# Patient Record
Sex: Female | Born: 1955 | Race: White | Hispanic: No | Smoking: Never smoker
Health system: Southern US, Community
[De-identification: ages and names within clinical notes are randomized; demographics above are authoritative.]

## PROBLEM LIST (undated history)

## (undated) DIAGNOSIS — M9080 Osteopathy in diseases classified elsewhere, unspecified site: Secondary | ICD-10-CM

## (undated) DIAGNOSIS — N183 Chronic kidney disease, stage 3 unspecified: Secondary | ICD-10-CM

## (undated) DIAGNOSIS — I7300 Raynaud's syndrome without gangrene: Secondary | ICD-10-CM

## (undated) DIAGNOSIS — Z8719 Personal history of other diseases of the digestive system: Secondary | ICD-10-CM

## (undated) DIAGNOSIS — K219 Gastro-esophageal reflux disease without esophagitis: Secondary | ICD-10-CM

## (undated) DIAGNOSIS — Z862 Personal history of diseases of the blood and blood-forming organs and certain disorders involving the immune mechanism: Secondary | ICD-10-CM

## (undated) DIAGNOSIS — K222 Esophageal obstruction: Secondary | ICD-10-CM

## (undated) DIAGNOSIS — I10 Essential (primary) hypertension: Secondary | ICD-10-CM

## (undated) DIAGNOSIS — I7090 Unspecified atherosclerosis: Secondary | ICD-10-CM

## (undated) DIAGNOSIS — E889 Metabolic disorder, unspecified: Secondary | ICD-10-CM

## (undated) DIAGNOSIS — N189 Chronic kidney disease, unspecified: Secondary | ICD-10-CM

## (undated) DIAGNOSIS — M898X9 Other specified disorders of bone, unspecified site: Secondary | ICD-10-CM

## (undated) DIAGNOSIS — M349 Systemic sclerosis, unspecified: Secondary | ICD-10-CM

## (undated) HISTORY — DX: Unspecified atherosclerosis: I70.90

## (undated) HISTORY — PX: HAND SURGERY: SHX662

## (undated) HISTORY — DX: Raynaud's syndrome without gangrene: I73.00

## (undated) HISTORY — DX: Personal history of diseases of the blood and blood-forming organs and certain disorders involving the immune mechanism: Z86.2

## (undated) HISTORY — DX: Personal history of other diseases of the digestive system: Z87.19

## (undated) HISTORY — DX: Chronic kidney disease, stage 3 (moderate): N18.3

## (undated) HISTORY — DX: Gastro-esophageal reflux disease without esophagitis: K21.9

## (undated) HISTORY — DX: Esophageal obstruction: K22.2

## (undated) HISTORY — DX: Metabolic disorder, unspecified: E88.9

## (undated) HISTORY — DX: Chronic kidney disease, stage 3 unspecified: N18.30

## (undated) HISTORY — PX: TONSILLECTOMY: SUR1361

## (undated) HISTORY — DX: Systemic sclerosis, unspecified: M34.9

## (undated) HISTORY — DX: Chronic kidney disease, unspecified: N18.9

## (undated) HISTORY — DX: Essential (primary) hypertension: I10

## (undated) HISTORY — DX: Osteopathy in diseases classified elsewhere, unspecified site: M90.80

## (undated) HISTORY — DX: Other specified disorders of bone, unspecified site: M89.8X9

---

## 1991-08-28 ENCOUNTER — Encounter (INDEPENDENT_AMBULATORY_CARE_PROVIDER_SITE_OTHER): Payer: Self-pay | Admitting: *Deleted

## 2002-06-29 ENCOUNTER — Encounter: Admission: RE | Admit: 2002-06-29 | Discharge: 2002-06-29 | Payer: Self-pay | Admitting: Family Medicine

## 2002-06-29 ENCOUNTER — Encounter: Payer: Self-pay | Admitting: Family Medicine

## 2002-07-06 ENCOUNTER — Inpatient Hospital Stay (HOSPITAL_COMMUNITY): Admission: AD | Admit: 2002-07-06 | Discharge: 2002-07-08 | Payer: Self-pay | Admitting: Internal Medicine

## 2002-07-07 ENCOUNTER — Encounter: Payer: Self-pay | Admitting: Cardiology

## 2002-10-26 ENCOUNTER — Encounter: Admission: RE | Admit: 2002-10-26 | Discharge: 2002-10-26 | Payer: Self-pay | Admitting: Rheumatology

## 2002-10-26 ENCOUNTER — Encounter: Payer: Self-pay | Admitting: Rheumatology

## 2003-07-13 ENCOUNTER — Encounter: Payer: Self-pay | Admitting: Rheumatology

## 2003-07-13 ENCOUNTER — Encounter: Admission: RE | Admit: 2003-07-13 | Discharge: 2003-07-13 | Payer: Self-pay | Admitting: Rheumatology

## 2005-05-12 ENCOUNTER — Ambulatory Visit: Payer: Self-pay | Admitting: Internal Medicine

## 2005-05-25 ENCOUNTER — Ambulatory Visit (HOSPITAL_COMMUNITY): Admission: RE | Admit: 2005-05-25 | Discharge: 2005-05-25 | Payer: Self-pay | Admitting: Rheumatology

## 2005-05-25 ENCOUNTER — Ambulatory Visit: Payer: Self-pay | Admitting: Cardiology

## 2005-05-25 ENCOUNTER — Encounter: Payer: Self-pay | Admitting: Cardiology

## 2006-07-13 ENCOUNTER — Ambulatory Visit: Payer: Self-pay | Admitting: Internal Medicine

## 2006-07-23 ENCOUNTER — Encounter: Payer: Self-pay | Admitting: Internal Medicine

## 2006-07-23 ENCOUNTER — Ambulatory Visit (HOSPITAL_COMMUNITY): Admission: RE | Admit: 2006-07-23 | Discharge: 2006-07-23 | Payer: Self-pay | Admitting: Internal Medicine

## 2006-07-23 DIAGNOSIS — K449 Diaphragmatic hernia without obstruction or gangrene: Secondary | ICD-10-CM

## 2006-07-23 DIAGNOSIS — K222 Esophageal obstruction: Secondary | ICD-10-CM

## 2006-07-27 ENCOUNTER — Ambulatory Visit: Payer: Self-pay | Admitting: Internal Medicine

## 2006-08-23 ENCOUNTER — Ambulatory Visit: Payer: Self-pay | Admitting: Internal Medicine

## 2006-09-19 ENCOUNTER — Encounter: Admission: RE | Admit: 2006-09-19 | Discharge: 2006-09-19 | Payer: Self-pay | Admitting: Rheumatology

## 2006-12-30 ENCOUNTER — Ambulatory Visit (HOSPITAL_BASED_OUTPATIENT_CLINIC_OR_DEPARTMENT_OTHER): Admission: RE | Admit: 2006-12-30 | Discharge: 2006-12-30 | Payer: Self-pay | Admitting: Orthopedic Surgery

## 2006-12-30 ENCOUNTER — Encounter (INDEPENDENT_AMBULATORY_CARE_PROVIDER_SITE_OTHER): Payer: Self-pay | Admitting: Specialist

## 2007-04-08 ENCOUNTER — Ambulatory Visit: Payer: Self-pay | Admitting: Internal Medicine

## 2008-04-09 ENCOUNTER — Encounter (HOSPITAL_BASED_OUTPATIENT_CLINIC_OR_DEPARTMENT_OTHER): Admission: RE | Admit: 2008-04-09 | Discharge: 2008-05-24 | Payer: Self-pay | Admitting: Surgery

## 2008-04-24 ENCOUNTER — Encounter: Payer: Self-pay | Admitting: Internal Medicine

## 2008-05-11 ENCOUNTER — Telehealth: Payer: Self-pay | Admitting: Internal Medicine

## 2008-05-24 ENCOUNTER — Encounter: Payer: Self-pay | Admitting: Internal Medicine

## 2008-05-25 ENCOUNTER — Encounter: Payer: Self-pay | Admitting: Internal Medicine

## 2008-06-22 ENCOUNTER — Ambulatory Visit: Payer: Self-pay | Admitting: Internal Medicine

## 2008-06-22 DIAGNOSIS — R634 Abnormal weight loss: Secondary | ICD-10-CM

## 2008-06-22 DIAGNOSIS — M349 Systemic sclerosis, unspecified: Secondary | ICD-10-CM

## 2008-09-05 ENCOUNTER — Ambulatory Visit: Payer: Self-pay | Admitting: Cardiovascular Disease

## 2008-09-05 ENCOUNTER — Encounter (INDEPENDENT_AMBULATORY_CARE_PROVIDER_SITE_OTHER): Payer: Self-pay | Admitting: Emergency Medicine

## 2008-09-05 ENCOUNTER — Inpatient Hospital Stay (HOSPITAL_COMMUNITY): Admission: EM | Admit: 2008-09-05 | Discharge: 2008-09-06 | Payer: Self-pay | Admitting: Emergency Medicine

## 2008-09-14 ENCOUNTER — Encounter: Payer: Self-pay | Admitting: Internal Medicine

## 2008-12-25 ENCOUNTER — Ambulatory Visit: Payer: Self-pay | Admitting: Internal Medicine

## 2008-12-25 DIAGNOSIS — J45909 Unspecified asthma, uncomplicated: Secondary | ICD-10-CM

## 2009-01-04 ENCOUNTER — Ambulatory Visit: Payer: Self-pay | Admitting: Internal Medicine

## 2009-02-15 ENCOUNTER — Encounter: Payer: Self-pay | Admitting: Internal Medicine

## 2009-02-22 ENCOUNTER — Ambulatory Visit: Payer: Self-pay | Admitting: Internal Medicine

## 2009-03-07 ENCOUNTER — Ambulatory Visit: Payer: Self-pay | Admitting: Internal Medicine

## 2009-07-02 ENCOUNTER — Telehealth: Payer: Self-pay | Admitting: Internal Medicine

## 2009-08-16 ENCOUNTER — Encounter: Payer: Self-pay | Admitting: Internal Medicine

## 2009-12-26 ENCOUNTER — Ambulatory Visit: Payer: Self-pay | Admitting: Internal Medicine

## 2009-12-26 DIAGNOSIS — I10 Essential (primary) hypertension: Secondary | ICD-10-CM | POA: Insufficient documentation

## 2009-12-28 ENCOUNTER — Telehealth: Payer: Self-pay | Admitting: Internal Medicine

## 2009-12-30 ENCOUNTER — Ambulatory Visit: Payer: Self-pay | Admitting: Internal Medicine

## 2009-12-30 DIAGNOSIS — N259 Disorder resulting from impaired renal tubular function, unspecified: Secondary | ICD-10-CM | POA: Insufficient documentation

## 2009-12-31 LAB — CONVERTED CEMR LAB
Calcium: 9.2 mg/dL (ref 8.4–10.5)
Chloride: 105 meq/L (ref 96–112)
Creatinine, Ser: 1.9 mg/dL — ABNORMAL HIGH (ref 0.4–1.2)
GFR calc non Af Amer: 29.47 mL/min (ref >60)
Glucose, Bld: 106 mg/dL — ABNORMAL HIGH (ref 70–99)
MCHC: 33.2 g/dL (ref 30.0–36.0)
MCV: 93.1 fL (ref 78.0–100.0)
RBC: 3.62 M/uL — ABNORMAL LOW (ref 3.87–5.11)
RDW: 14.2 % (ref 11.5–14.6)
Sodium: 140 meq/L (ref 135–145)
WBC: 12.3 10*3/uL — ABNORMAL HIGH (ref 4.5–10.5)

## 2010-01-13 ENCOUNTER — Ambulatory Visit: Payer: Self-pay | Admitting: Internal Medicine

## 2010-01-16 DIAGNOSIS — J209 Acute bronchitis, unspecified: Secondary | ICD-10-CM | POA: Insufficient documentation

## 2010-01-31 ENCOUNTER — Ambulatory Visit: Payer: Self-pay | Admitting: Internal Medicine

## 2010-01-31 DIAGNOSIS — R197 Diarrhea, unspecified: Secondary | ICD-10-CM

## 2010-01-31 DIAGNOSIS — L299 Pruritus, unspecified: Secondary | ICD-10-CM | POA: Insufficient documentation

## 2010-01-31 LAB — CONVERTED CEMR LAB
Calcium: 8.7 mg/dL (ref 8.4–10.5)
Creatinine, Ser: 4.2 mg/dL — ABNORMAL HIGH (ref 0.4–1.2)

## 2010-02-03 ENCOUNTER — Ambulatory Visit: Payer: Self-pay | Admitting: Internal Medicine

## 2010-02-03 ENCOUNTER — Ambulatory Visit: Payer: Self-pay | Admitting: Cardiovascular Disease

## 2010-02-03 ENCOUNTER — Inpatient Hospital Stay (HOSPITAL_COMMUNITY): Admission: AD | Admit: 2010-02-03 | Discharge: 2010-02-11 | Payer: Self-pay

## 2010-02-03 LAB — CONVERTED CEMR LAB
Basophils Absolute: 0 10*3/uL (ref 0.0–0.1)
Basophils Relative: 0 % (ref 0.0–3.0)
Bilirubin Urine: NEGATIVE
CO2: 26 meq/L (ref 19–32)
Chloride: 98 meq/L (ref 96–112)
Eosinophils Absolute: 0.5 10*3/uL (ref 0.0–0.7)
Eosinophils Relative: 4.6 % (ref 0.0–5.0)
GFR calc non Af Amer: 11.18 mL/min (ref >60)
Glucose, Bld: 98 mg/dL (ref 70–99)
HCT: 34.3 % — ABNORMAL LOW (ref 36.0–46.0)
Ketones, ur: NEGATIVE mg/dL
Leukocytes, UA: NEGATIVE
Lymphocytes Relative: 8.8 % — ABNORMAL LOW (ref 12.0–46.0)
Platelets: 296 10*3/uL (ref 150.0–400.0)
Potassium: 3.8 meq/L (ref 3.5–5.1)
RBC: 3.7 M/uL — ABNORMAL LOW (ref 3.87–5.11)
RDW: 13.7 % (ref 11.5–14.6)
Specific Gravity, Urine: 1.01 (ref 1.000–1.030)
Total Bilirubin: 0.7 mg/dL (ref 0.3–1.2)
Urine Glucose: NEGATIVE mg/dL
Urobilinogen, UA: 0.2 (ref 0.0–1.0)
WBC: 9.8 10*3/uL (ref 4.5–10.5)
pH: 6 (ref 5.0–8.0)

## 2010-02-05 ENCOUNTER — Encounter (INDEPENDENT_AMBULATORY_CARE_PROVIDER_SITE_OTHER): Payer: Self-pay | Admitting: Internal Medicine

## 2010-02-07 ENCOUNTER — Encounter (INDEPENDENT_AMBULATORY_CARE_PROVIDER_SITE_OTHER): Payer: Self-pay | Admitting: Internal Medicine

## 2010-02-10 ENCOUNTER — Encounter: Payer: Self-pay | Admitting: Internal Medicine

## 2010-02-11 LAB — CONVERTED CEMR LAB: Haptoglobin: 116 mg/dL (ref 16–200)

## 2010-02-27 ENCOUNTER — Ambulatory Visit: Payer: Self-pay | Admitting: Internal Medicine

## 2010-02-27 ENCOUNTER — Telehealth (INDEPENDENT_AMBULATORY_CARE_PROVIDER_SITE_OTHER): Payer: Self-pay | Admitting: *Deleted

## 2010-02-27 DIAGNOSIS — R079 Chest pain, unspecified: Secondary | ICD-10-CM

## 2010-08-22 ENCOUNTER — Encounter: Payer: Self-pay | Admitting: Internal Medicine

## 2010-12-12 ENCOUNTER — Ambulatory Visit: Payer: Self-pay | Admitting: Family Medicine

## 2010-12-18 ENCOUNTER — Ambulatory Visit: Payer: Self-pay | Admitting: Family Medicine

## 2010-12-23 ENCOUNTER — Telehealth: Payer: Self-pay | Admitting: Internal Medicine

## 2011-01-02 ENCOUNTER — Ambulatory Visit
Admission: RE | Admit: 2011-01-02 | Discharge: 2011-01-02 | Payer: Self-pay | Source: Home / Self Care | Attending: Family Medicine | Admitting: Family Medicine

## 2011-01-29 NOTE — Progress Notes (Signed)
Summary: On call- chest tight  Phone Note Call from Patient   Summary of Call: On call- Reports ov 12/30, Rx'd doxycycline and prednisone. has all regular meds as well, but chest tightness still comes and goes wihout pain or fever. She was calm and conversational on phone. Plan- discussed her meds. Recommended she continue current meds long enough to assess. If needed, go to ER or wait for OV on Monday. she was fine with that. Initial call taken by: Waymon Budge MD,  December 28, 2009 9:12 PM

## 2011-01-29 NOTE — Assessment & Plan Note (Signed)
Summary: Pulmonary/ ext post hosp f/u ov   Primary Provider/Referring Provider:  Sherene Sires  CC:  Acute visit.  Pt c/o CP upon inspiration x 1 wk.  She states that she has also noticed an increase in SOB.  Pain in chest is sharp.  [name]  History of Present Illness: 55 yowf never smoker with asthma,  Scleroderma/ CRI  followed  by Kellie Simmering / Allena Katz.    01/13/10--ov  states breathing has improved since last OV.  has finished augmentin and pred taper. Last visit with bronchitic flare. CXR with increased marking bilaterally ?PNA vs edema > much better  Cough and congestion are gone and dyspnea is much better but on ace with new increase  creat 1.9 so change to bystolic > creat to 4.2  on f/u so admitted for renal w/u with d/c creat 4.8 on ACE.  February 27, 2010 ov  post hosp f/u back on high dose captopril with new onset x 1 week  migrating bilateral lower rib pains, only in sitting position, resolved supine, no cough or def increase cough/sob.  Pt denies any significant sore throat, dysphagia, itching, sneezing,  nasal congestion or excess secretions,  fever, chills, sweats, unintended wt loss, pleuritic or exertional cp, hempoptysis, change in activity tolerance  orthopnea pnd or leg swelling Pt also denies any obvious fluctuation in symptoms with weather or environmental change or other alleviating or aggravating factors.             Current Medications (verified): 1)  Pepcid 20 Mg Tabs (Famotidine) .[name].. 1 Every Am and 1 At Bedtime 2)  Allegra 60 Mg  Tabs (Fexofenadine Hcl) .... Take 1 Tablet By Mouth Two Times A Day 3)  Synthroid 50 Mcg  Tabs (Levothyroxine Sodium) .... Take 1 Tablet By Mouth Once A Day 4)  Qvar 80 Mcg/act  Aers (Beclomethasone Dipropionate) .... Two  Puffs First Thing in Am and Then About 12 Hours Later 5)  Felodipine 5 Mg Xr24h-Tab (Felodipine) .[name].. 1 Once Daily 6)  Captopril 100 Mg Tabs (Captopril) .[name].. 1 Three Times A Day  Allergies (verified): 1)  ! * Avelox 2)  ! *  Cephalasporin 3)  ! Penicillin 4)  ! Codeine 5)  ! Augmentin 6)  ! * Minocycline 7)  ! * Rifampin  Past History:  Past Medical History: GERD (ICD-530.81) HIATAL HERNIA (ICD-553.3) ESOPHAGEAL STRICTURE (ICD-530.3)................................................Caitlyn KitchenMarina Goodell      - EGD  07/23/06 SCLERODERMA.....................................................................................Caitlyn KitchenTruslow HBP ASTHMA..................................................................................................Caitlyn KitchenWert    - PFT's 01/04/09  FEV1 1.67 (65%) ratio 66,  8% improvment after B2 (on symbicort) DLC0 80    - HFA 75% March 07, 2009 > 90% December 26, 2009  Renal insufficiency ---sCr 1.9 12/2009 (?baseline) , ACE stopped 01/13/10 > change to bystolic 5 mg one daily> 4.2 January 31, 2010    - d/c bisoprolol, resume plendil > nephrology eval...............................Caitlyn KitchenPatel HTN  Vital Signs:  Patient profile:   55 year old female Weight:      101.50 pounds O2 Sat:      92 % on Room air Pulse rate:   68 / minute BP sitting:   116 / 68  (left arm)  Vitals Entered By: Vernie Murders (February 27, 2010 4:03 PM)  O2 Flow:  Room air  Physical Exam  Additional Exam:  wt   134 03/07/09 > 119 December 26, 2009 > 118 December 30, 2009 >>113 01/13/10 > 114 January 31, 2010 > 101 February 27, 2010  Chronically ill with classic scleroderma  facies, severe  HEENT mild turbinate edema.  Oropharynx no thrush or excess pnd or cobblestoning.  No JVD or cervical adenopathy. Mild accessory muscle hypertrophy. Trachea midline, nl thryroid. Chest was hyperinflated by percussion with diminished breath sounds and moderate increased exp time with no sign  wheeze. Hoover sign positive at mid inspiration. Regular rate and rhythm without murmur gallop or rub or increase P2. No edema.  Abd: no hsm, nl excursion. Ext warm without cyanosis or clubbing.       Impression & Recommendations:  Problem # 1:  INTRINSIC ASTHMA,  UNSPECIFIED (ICD-493.10) All goals of asthma met including optimal function and elimination of symptoms with minimum need for rescue therapy. Contingencies discussed today including the rule of two's.   I spent extra time with the patient today explaining optimal mdi  technique.  This improved from  50-75%   Each maintenance medication was reviewed in detail including most importantly the difference between maintenance and as needed and under what circumstances the prns are to be used.   Problem # 2:  CHEST PAIN (ICD-786.50)  Classic migratory  subdiaphragmatic pain pattern suggests ibs:  daytime, not exacerbated by ex or coughing, worse in sitting position, associated with generalized abd bloating, not present supine due to the dome effect of the diaphram canceled in that position.   Each maintenance medication was reviewed in detail including most importantly the difference between maintenance and as needed and under what circumstances the prns are to be used. See instructions for specific recommendations   Orders: Est. Patient Level IV ([address]109)  Problem # 3:  HYPERTENSION, BENIGN (ICD-401.1)  The following medications were removed from the medication list:    Bisoprolol Fumarate 5 Mg Tabs (Bisoprolol fumarate) .[name]... 1 by mouth once daily Her updated medication list for this problem includes:    Felodipine 5 Mg Xr24h-tab (Felodipine) .[name]... 1 once daily    Captopril 100 Mg Tabs (Captopril) .[name]... 1 three times a day  ACE inhibitors are problematic in  pts with airway complaints because  even experienced pulmnologists can't always distinguish ace effects from copd/asthma.  By themselves they don't actually cause a problem, much like oxygen can't by itself start a fire, but they certainly serve as a powerful catalyst or enhancer for any "fire"  or inflammatory process in the upper airway, be it caused by an ET  tube or more commonly reflux (especially in the obese or pts with known GERD or who  are on biphoshonates).   For now ok to continue captopril ??? would ARB be alternative if resp symptoms worsen???  Orders: Est. Patient Level IV (60454)  Medications Added to Medication List This Visit: 1)  Pepcid 20 Mg Tabs (Famotidine) .[name].. 1 every am and 1 at bedtime 2)  Felodipine 5 Mg Xr24h-tab (Felodipine) .[name].. 1 once daily 3)  Captopril 100 Mg Tabs (Captopril) .[name].. 1 three times a day  Patient Instructions: 1)  Call if breathing worse and not responding to albuterol 2)  citrucel 1 tsp twice daily with fluids and no foods that cause gas

## 2011-01-29 NOTE — Letter (Signed)
Summary: Stacey Drain MD  Stacey Drain MD   Imported By: Lennie Odor 09/03/2010 12:09:33  _____________________________________________________________________  External Attachment:    Type:   Image     Comment:   External Document
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
## 2011-01-29 NOTE — Assessment & Plan Note (Signed)
Summary: Pulmonary/ ext f/u ov  Diarrhea/ worse CRI   Primary Provider/Referring Provider:  Sherene [name]  CC:  2 wk followup.  Pt states that breathing has been okay.  She c/o still having diarrhea from taking augmentin.  Also c/o ithcing after starting the bisoprolol..  History of Present Illness: 19 yowf never smoker with Scleroderma followed mostly by [name] [facility] with Es stricture  01/04/09 PFT's on symbicort mild airflow obstruction but no definite help with symptoms so stopped it the second week in Feb and worse since then sob with chest  tightness x 2 weeks with minimal am sputum  needs more proaire  including waking from  sleep, which helps maybe 6 hours then worse again, never completely relieved.  rx qvar   March 07, 2009 ov much better on qvar 80 2 two times a day rare need for albuterol (may be every 2-3 days).  no sob or cough.   December 26, 2009 Acute visit.  Pt c/o facial pressure and earache x 2 wks.  Also c/o increased SOB x 4 days- gets SOB with little exertion.  Also has some chest tightness in the am with prod cough-green/brown sputum.  increased need for albuterol last 4 days last used used one hour before ov.  rx doxy and prednisone.  December 30, 2009 Acute visit.  Pt c/o increased SOB since 12/31.  Pt states that she is SOB with any sort of exertion.  She also still c/o wheezing and ches tightness- same as when seen in 12/30.  Pt states that cough is about the same with persistent purulent sputum. says now remembers took augmentin with last flare with good response and no reactiion.   rx augmentin/pred taper  01/13/10--Presents for 2 week follow up - states breathing has improved since last OV.  has finished augmentin and pred taper. Last visit with bronchitic flare. CXR with increased marking bilaterally ?PNA vs edema > much better  Cough and congestion are gone and dyspnea is much better but on ace with creat 1.9 so change to bystolic  January 31, 2010 ov cc  that breathing has  been okay.  She c/o still having diarrhea from taking augmentin.  Also c/o ithcing after starting the bisoprolol. denies significant sore throat, dysphagia, sneezing,  nasal congestion or excess secretions,  fever, chills, sweats, unintended wt loss, pleuritic or exertional cp, hempoptysis, change in activity tolerance  orthopnea pnd or leg swellling  Current Medications (verified): 1)  Zegerid 40-1100 Mg  Caps (Omeprazole-Sodium Bicarbonate) .[name].. 1 At Supper 2)  Allegra 60 Mg  Tabs (Fexofenadine Hcl) .... Take 1 Tablet By Mouth Two Times A Day 3)  Synthroid 50 Mcg  Tabs (Levothyroxine Sodium) .... Take 1 Tablet By Mouth Once A Day 4)  Qvar 80 Mcg/act  Aers (Beclomethasone Dipropionate) .... Two  Puffs First Thing in Am and Then About 12 Hours Later 5)  Excedrin Tension Headache 500-65 Mg Tabs (Acetaminophen-Caffeine) .... As Directed As Needed 6)  Bisoprolol Fumarate 5 Mg Tabs (Bisoprolol Fumarate) .[name].. 1 By Mouth Once Daily  Allergies (verified): 1)  ! * Avelox 2)  ! * Cephalasporin 3)  ! Penicillin 4)  ! Codeine 5)  ! Augmentin 6)  ! * Minocycline 7)  ! * Rifampin  Past History:  Past Medical History: GERD (ICD-530.81) HIATAL HERNIA (ICD-553.3) ESOPHAGEAL STRICTURE (ICD-530.3)................................................Marland KitchenMarina [name]      - EGD  07/23/06 SCLERODERMA.....................................................................................Marland KitchenTruslow HBP [name]..................................................................................................Marland KitchenWert    - PFT's 01/04/09  FEV1 1.67 (65%) ratio 66,  8%  improvment after B2 (on symbicort) DLC0 80    - HFA 75% March 07, 2009 > 90% December 26, 2009  Renal insufficiency ---sCr 1.9 12/2009 (?baseline) , ACE stopped 01/13/10 > change to bystolic 5 mg one daily> 4.2 January 31, 2010    - d/c bisoprolol, resume plendil > nephrology referral  HTN  Vital Signs:  Patient profile:   55 year old female Weight:      114  pounds O2 Sat:      99 % on Room air Temp:     97.4 degrees F oral Pulse rate:   59 / minute BP sitting:   152 / 80  (left arm)  Vitals Entered By: [name] (January 31, 2010 4:20 PM)  O2 Flow:  Room air  Physical Exam  Additional Exam:  wt  133 12/25/08 >  134 03/07/09 > 119 December 26, 2009 > 118 December 30, 2009 >>113 01/13/10 > 114 January 31, 2010  Chronically ill with classic scleroderma facies, severe  HEENT mild turbinate edema.  Oropharynx no thrush or excess pnd or cobblestoning.  No JVD or cervical adenopathy. Mild accessory muscle hypertrophy. Trachea midline, nl thryroid. Chest was hyperinflated by percussion with diminished breath sounds and moderate increased exp time with no sign  wheeze. Hoover sign positive at mid inspiration. Regular rate and rhythm without murmur gallop or rub or increase P2. No edema.  Abd: no hsm, nl excursion. Ext warm without cyanosis or clubbing.       Impression & Recommendations:  Problem # 1:  BRONCHITIS, ACUTE (ICD-466.0)  Resolved, no change in rx Her updated medication list for this problem includes:    Qvar 80 Mcg/act Aers (Beclomethasone dipropionate) .[name]..[name] Two  puffs first thing in am and then about 12 hours later    Metronidazole 250 Mg Tabs (Metronidazole) ..... One three times a day x 10days  Orders: Est. Patient Level IV (16109)  Problem # 2:  RENAL INSUFFICIENCY (ICD-588.9)  Discussed with Dr [name], will stop bisoprolol and restart plendil, push fluids and recheck in 48 hours and refer to renal inpt vs outpt depending on response  ddx = scleroderma kidney vs residual from ace vs dehydration from diarrea.  NO NSAIDS  Orders: Est. Patient Level IV (60454)  Problem # 3:  PRURITUS (ICD-698.9)  Developed after off augmentin for about a week and after started bystoloic ? from bystolic, try off and back on plendil or amlodipine if pt prefers  rx benadryl and pepcid and short course prednisone  Orders: Est. Patient  Level IV (09811)  Problem # 4:  DIARRHEA (ICD-787.91)  Try flagyl and hydrate with gatorade.  diet discussed, to ER if worsens  Orders: Est. Patient Level IV (91478)  Medications Added to Medication List This Visit: 1)  Prednisone 10 Mg Tabs (Prednisone) .... 4 each am x 2days, 2x2days, 1x2days and stop 2)  Metronidazole 250 Mg Tabs (Metronidazole) .... One three times a day x 10days  Patient Instructions: 1)  Stop zegrid  2)  Pepcid 20mg  in am and at bedtime 3)  Benadryl would be stronger than the allegra for itching 4)  Prednisone 4 each am x 2days, 2x2days, 1x2days and stop 5)  Flagyl 250 mg three times a day for 10 days 6)  Return here as needed for respiratory problems 7)  Diet no dairy products, fresh vegatables, salads Prescriptions: METRONIDAZOLE 250 MG TABS (METRONIDAZOLE) one three times a day x 10days  #30 x 0  Entered and Authorized by:   Caitlyn Cowden MD   Signed by:   Caitlyn Cowden MD on 01/31/2010   Method used:   Electronically to        [facility]  [address] [id number] (retail)       [address]       [place], [place]  [address]       Ph: [phone] or [phone]       Fax: [phone]   RxID:   [id number] PREDNISONE 10 MG  TABS (PREDNISONE) 4 each am x 2days, 2x2days, 1x2days and stop  #14 x 0   Entered and Authorized by:   Caitlyn Cowden MD   Signed by:   Caitlyn Cowden MD on 01/31/2010   Method used:   Electronically to        [facility]  [address] [id number] (retail)       [address]       [place], [place]  [address]       Ph: [phone] or [phone]       Fax: [phone]   RxID:   [id number]

## 2011-01-29 NOTE — Assessment & Plan Note (Signed)
Summary: NP follow up   Primary Provider/Referring Provider:  Sherene Sires  CC:  2 week follow up - states breathing has improved since last OV.  has finished augmentin and pred taper.  History of Present Illness: 34 yowf never smoker with Scleroderma followed mostly by [facility] with Es stricture  01/04/09 PFT's on symbicort mild airflow obstruction but no definite help with symptoms so stopped it the second week in Feb and worse since then sob with chest  tightness x 2 weeks with minimal am sputum  needs more proaire  including waking from  sleep, which helps maybe 6 hours then worse again, never completely relieved.  rx qvar   March 07, 2009 ov much better on qvar 80 2 two times a day rare need for albuterol (may be every 2-3 days).  no sob or cough.   September last week 2010 [facility] with ear ache rx prednisone and doxy cleared up  Was seen at [facility] on 12/14  for right ear ache txed with Doxy x 5 days and no prednisone.  December 26, 2009 Acute visit.  Pt c/o facial pressure and earache x 2 wks.  Also c/o increased SOB x 4 days- gets SOB with little exertion.  Also has some chest tightness in the am with prod cough-green/brown sputum.  increased need for albuterol last 4 days last used used one hour before ov.  rx doxy and prednisone.  December 30, 2009 Acute visit.  Pt c/o increased SOB since 12/31.  Pt states that she is SOB with any sort of exertion.  She also still c/o wheezing and chest tightness- same as when seen in 12/30.  Pt states that cough is about the same with persistent purulent sputum. says now remembers took augmentin with last flare with good response and no reactiion.     01/13/10--Presents for 2 week follow up - states breathing has improved since last OV.  has finished augmentin and pred taper. Last visit with bronchitic flare. Tx w/ augmentin and steroid taper. CXR with increased marking bilaterally ?PNA vs edema. She feels much better today. Cough and congestion are  gone and dyspnea is much better. Denies chest pain, dyspnea, orthopnea, hemoptysis, fever, n/v/d, edema, headache.   Medications Prior to Update: 1)  Zegerid 40-1100 Mg  Caps (Omeprazole-Sodium Bicarbonate) .[name].. 1 At Supper 2)  Felodipine 5 Mg  Tb24 (Felodipine) .... One At Bedtime 3)  Nidro Bid Cream .... Apply 1 Time Daily 4)  Allegra 60 Mg  Tabs (Fexofenadine Hcl) .... Take 1 Tablet By Mouth Two Times A Day 5)  Synthroid 50 Mcg  Tabs (Levothyroxine Sodium) .... Take 1 Tablet By Mouth Once A Day 6)  Qvar 80 Mcg/act  Aers (Beclomethasone Dipropionate) .... Two  Puffs First Thing in Am and Then About 12 Hours Later 7)  Advil Cold/sinus 30-200 Mg Tabs (Pseudoephedrine-Ibuprofen) .... As Directed As Needed 8)  Excedrin Tension Headache 500-65 Mg Tabs (Acetaminophen-Caffeine) .... As Directed As Needed 9)  Doxycycline Hyclate 100 Mg Caps (Doxycycline Hyclate) .... One Twice Daily Taken Before Meals 10)  Prednisone 10 Mg  Tabs (Prednisone) .... 4 Each Am X 2days, 2x2days, 1x2days and Stop 11)  Augmentin 875-125 Mg  Tabs (Amoxicillin-Pot Clavulanate) .... By Mouth Twice Daily 12)  Prednisone 10 Mg  Tabs (Prednisone) .... 4 Each Am X 2 Days,  3 X 2days, 2x2 Days, and 1x2 Days  Current Medications (verified): 1)  Zegerid 40-1100 Mg  Caps (Omeprazole-Sodium Bicarbonate) .[name].. 1 At Supper 2)  Allegra 60 Mg  Tabs (Fexofenadine Hcl) .... Take 1 Tablet By Mouth Two Times A Day 3)  Synthroid 50 Mcg  Tabs (Levothyroxine Sodium) .... Take 1 Tablet By Mouth Once A Day 4)  Qvar 80 Mcg/act  Aers (Beclomethasone Dipropionate) .... Two  Puffs First Thing in Am and Then About 12 Hours Later 5)  Excedrin Tension Headache 500-65 Mg Tabs (Acetaminophen-Caffeine) .... As Directed As Needed 6)  Lisinopril 5 Mg Tabs (Lisinopril) .... Take 1 Tablet By Mouth Once A Day  Allergies (verified): 1)  ! * Avelox 2)  ! * Cephalasporin 3)  ! Penicillin 4)  ! Codeine 5)  ! Augmentin 6)  ! * Minocycline 7)  ! 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  Alcohol Use - no Daily Caffeine Use-1 drink daily Illicit Drug Use - no Patient does not get regular exercise.   Risk Factors: Exercise: no (06/22/2008)  Risk Factors: Smoking Status: never (06/22/2008)  Past Medical History: GERD (ICD-530.81) HIATAL HERNIA (ICD-553.3) ESOPHAGEAL STRICTURE (ICD-530.3)................................................Marland KitchenMarina Goodell      - EGD  07/23/06 SCLERODERMA.....................................................................................Marland KitchenTruslow HBP ASTHMA..................................................................................................Marland KitchenWert    - PFT's 01/04/09  FEV1 1.67 (65%) ratio 66,  8% improvment after B2 (on symbicort) DLC0 80    - HFA 75% March 07, 2009 > 90% December 26, 2009  Renal insufficiency ---sCr 1.9 12/2009 (?baseline) , ACE stopped 01/13/10 HTN --ACE stopped, started on Bisoprolol 5mg  once daily 01/13/10  Review of Systems      See HPI  Vital Signs:  Patient profile:   55 year old female Height:      65 inches Weight:      113.25 pounds BMI:     18.91 O2 Sat:      100 % on Room air Temp:     97.1 degrees F oral Pulse rate:   76 / minute BP sitting:   138 / 70  (right arm) Cuff size:   regular  Vitals Entered By: Boone Master CNA (January 13, 2010 4:40 PM)  O2 Flow:  Room air CC: 2 week follow up - states breathing has improved since last OV.  has finished augmentin and pred taper Is Patient Diabetic? No Comments Medications reviewed with  patient Daytime contact number verified with patient. Boone Master CNA  January 13, 2010 4:42 PM    Physical Exam  Additional Exam:  wt 136 > 133 12/25/08 > 132 February 22, 2009 > 134 03/07/09 > 119 December 26, 2009 > 118 December 30, 2009 >>113 01/13/10 Chronically ill with classic scleroderma facies, severe  HEENT mild turbinate edema.  Oropharynx no thrush or excess pnd or cobblestoning.  No JVD or cervical adenopathy. Mild accessory muscle hypertrophy. Trachea midline, nl thryroid. Chest was hyperinflated by percussion with diminished breath sounds and moderate increased exp time with trace bilateral wheeze. Hoover sign positive at mid inspiration. Regular rate and rhythm without murmur gallop or rub or increase P2. No edema.  Abd: no hsm, nl excursion. Ext warm without cyanosis or clubbing.       Impression & Recommendations:  Problem # 1:  BRONCHITIS, ACUTE (ICD-466.0)  Recent bronchitis vs PNA Pt is  much improved clinically.  CXR is cleared with resolved infiltrates, since she improved with abx, suspect this was undelying PNA will cont on same meds.  follow up as scheduled Dr. Sherene Sires  The following medications were removed from the medication list:    Advil Cold/sinus 30-200 Mg Tabs (Pseudoephedrine-ibuprofen) .[name]... As directed as needed    Doxycycline Hyclate 100 Mg Caps (Doxycycline hyclate) ..... One twice daily taken before meals    Augmentin 875-125 Mg Tabs (Amoxicillin-pot clavulanate) ..... By mouth twice daily Her updated medication list for this problem includes:    Qvar 80 Mcg/act Aers (Beclomethasone dipropionate) .[name]..[name] Two  puffs first thing in am and then about 12 hours later  Orders: Est. Patient Level IV (78469)  Problem # 2:  RENAL INSUFFICIENCY (ICD-588.9)  ?scr baseline, pt called back and was recently placed on ACE Inhib.  Have advised her to stop, will repeat bmet on return in 2 weeks  Orders: Est. Patient Level IV (62952)  Problem # 3:  HYPERTENSION,  BENIGN (ICD-401.1)  recently changed off felodipine to ace  will stop ace d/t renal insuff. and start bisoprolol daily  follow up 2 weeks Dr. Sherene Sires  The following medications were removed from the medication list:    Felodipine 5 Mg Tb24 (Felodipine) ..... One at bedtime Her updated medication list for this problem includes:    Bisoprolol Fumarate 5 Mg Tabs (Bisoprolol fumarate) .[name]... 1 by mouth once daily  Orders: Est. Patient Level IV (84132)  Medications Added to Medication List This Visit: 1)  Lisinopril 5 Mg Tabs (Lisinopril) .... Take 1 tablet by mouth once a day 2)  Bisoprolol Fumarate 5 Mg Tabs (Bisoprolol fumarate) .[name].. 1 by mouth once daily  Complete Medication List: 1)  Zegerid 40-1100 Mg Caps (Omeprazole-sodium bicarbonate) .[name].. 1 at supper 2)  Allegra 60 Mg Tabs (Fexofenadine hcl) .... Take 1 tablet by mouth two times a day 3)  Synthroid 50 Mcg Tabs (Levothyroxine sodium) .... Take 1 tablet by mouth once a day 4)  Qvar 80 Mcg/act Aers (Beclomethasone dipropionate) .... Two  puffs first thing in am and then about 12 hours later 5)  Excedrin Tension Headache 500-65 Mg Tabs (Acetaminophen-caffeine) .... As directed as needed 6)  Bisoprolol Fumarate 5 Mg Tabs (Bisoprolol fumarate) .[name].. 1 by mouth once daily  Other Orders: T-2 View CXR (71020TC)  Patient Instructions: 1)  I will call with xray results.  2)  Set up physical in 2-3 months  with Parrett NP  3)  Continue on same meds.  4)  Please contact office for sooner follow up if symptoms do not improve or worsen  5)  Late note: stop Lisinopril and begin Bisoprolol 5mg  once daily  6)  follow up 2 weeks. with bmet  Prescriptions: BISOPROLOL FUMARATE 5 MG TABS (BISOPROLOL FUMARATE) 1 by mouth once daily  #30 x 5   Entered and Authorized by:   Rubye Oaks NP   Signed by:   Tammy Parrett NP on 01/14/2010   Method used:   Electronically to        [facility]  [address] [id number] (retail)       [address]       [place], [place]  [address]       Ph: [phone] or [phone]       Fax: [phone]   RxID:   [id number] SYNTHROID 50 MCG  TABS (LEVOTHYROXINE SODIUM) Take 1 tablet by mouth once a day  #  30 x 5   Entered and Authorized by:   Rubye Oaks NP   Signed by:   Tammy Parrett NP on 01/13/2010   Method used:   [facility] to        [facility]  [address] [id number] (retail[facility]       [address]       [place], [place]  [address]       Ph: [phone] or [phone]       Fax: [phone]   RxID:   [id number]    Immunization History:  Influenza Immunization History:    Influenza:  historical (09/27/2009)

## 2011-01-29 NOTE — Progress Notes (Signed)
Summary: pain on left side  Phone Note Call from Patient Call back at Home Phone [phone]   Caller: Patient Call For: tammy p Reason for Call: Talk to Nurse Summary of Call: Pain in left side around ribs, couldn't catch breath(started Sunday), Nagging pain in lung area.  Feels like pinching in the ribs.  Took 1/2 of a vicodin yesterday and was ok, but today still have that little nagging pain. Initial call taken by: Eugene Gavia,  February 27, 2010 11:22 AM  Follow-up for Phone Call        Appt Scheduled Today Follow-up by: Vernie Murders,  February 27, 2010 11:33 AM

## 2011-01-29 NOTE — Assessment & Plan Note (Signed)
Summary: Pulmonary/ acute ov   Primary Provider/Referring Provider:  Sherene Sires  CC:  Acute visit.  Pt c/o increased SOB since 12/31.  Pt states that she is SOB with any sort of exertion.  She also still c/o wheezing and chest tightness- same as when seen in 12/30.  Pt states that cough is about the same.  [name]  History of Present Illness: 27 yowf never smoker with Scleroderma followed mostly by [facility] with Es stricture  December 25, 2008 eval for gradual  onset x 2 weeks of  am cough/congestion/wheezing/chest tightness much better after albuterol.  Rec work on inhaler technique and add zegerid bedtime and symbicort twice daily  01/04/09 PFT's on symbicort mild airflow obstruction but no definite help with symptoms so stopped it the second week in Feb and worse since then sob with chest  tightness x 2 weeks with minimal am sputum  needs more proaire  including waking from  sleep, which helps maybe 6 hours then worse again, never completely relieved.  rx qvar   March 07, 2009 ov much better on qvar 80 2 two times a day rare need for albuterol (may be every 2-3 days).  no sob or cough.   September last week 2010 [facility] with ear ache rx prednisone and doxy cleared up  Was seen at [facility] on 12/14  for right ear ache txed with Doxy x 5 days and no prednisone.  December 26, 2009 Acute visit.  Pt c/o facial pressure and earache x 2 wks.  Also c/o increased SOB x 4 days- gets SOB with little exertion.  Also has some chest tightness in the am with prod cough-green/brown sputum.  increased need for albuterol last 4 days last used used one hour before ov.  rx doxy and prednisone.  December 30, 2009 Acute visit.  Pt c/o increased SOB since 12/31.  Pt states that she is SOB with any sort of exertion.  She also still c/o wheezing and chest tightness- same as when seen in 12/30.  Pt states that cough is about the same with persistent purulent sputum. says now remembers took augmentin with last flare with  good response and no reactiion.  Pt denies any significant sore throat, dysphagia, itching, sneezing, fever, chills, sweats, unintended wt loss, pleuritic or exertional cp, hempoptysis, change in activity tolerance  orthopnea pnd or leg swelling   Current Medications (verified): 1)  Zegerid 40-1100 Mg  Caps (Omeprazole-Sodium Bicarbonate) .[name].. 1 At Supper 2)  Felodipine 5 Mg  Tb24 (Felodipine) .... One At Bedtime 3)  Nidro Bid Cream .... Apply 1 Time Daily 4)  Allegra 60 Mg  Tabs (Fexofenadine Hcl) .... Take 1 Tablet By Mouth Two Times A Day 5)  Synthroid 50 Mcg  Tabs (Levothyroxine Sodium) .... Take 1 Tablet By Mouth Once A Day 6)  Qvar 80 Mcg/act  Aers (Beclomethasone Dipropionate) .... Two  Puffs First Thing in Am and Then About 12 Hours Later 7)  Advil Cold/sinus 30-200 Mg Tabs (Pseudoephedrine-Ibuprofen) .... As Directed As Needed 8)  Excedrin Tension Headache 500-65 Mg Tabs (Acetaminophen-Caffeine) .... As Directed As Needed 9)  Doxycycline Hyclate 100 Mg Caps (Doxycycline Hyclate) .... One Twice Daily Taken Before Meals 10)  Prednisone 10 Mg  Tabs (Prednisone) .... 4 Each Am X 2days, 2x2days, 1x2days and Stop  Allergies (verified): 1)  ! * Avelox 2)  ! * Cephalasporin 3)  ! Penicillin 4)  ! Codeine 5)  ! Augmentin 6)  ! * Minocycline 7)  ! *  Rifampin  Past History:  Past Medical History: GERD (ICD-530.81) HIATAL HERNIA (ICD-553.3) ESOPHAGEAL STRICTURE (ICD-530.3)................................................Marland KitchenMarina Goodell      - EGD  07/23/06 SCLERODERMA.....................................................................................[facility]low HBP ASTHMA..................................................................................................Marland KitchenWert    - PFT's 01/04/09  FEV1 1.67 (65%) ratio 66,  8% improvment after B2 (on symbicort) DLC0 80    - HFA 75% March 07, 2009 > 90% December 26, 2009   Vital Signs:  Patient profile:   55 year old female Weight:      118.50  pounds Temp:     97.7 degrees F oral Pulse rate:   86 / minute BP sitting:   160 / 82  (left arm)  Vitals Entered By: Vernie Murders (December 30, 2009 2:39 PM)  O2 Flow:  Room air  Physical Exam  Additional Exam:  wt 136 > 133 12/25/08 > 132 February 22, 2009 > 134 03/07/09 > 119 December 26, 2009 > 118 December 30, 2009  Chronically ill with classic scleroderma facies, severe  HEENT mild turbinate edema.  Oropharynx no thrush or excess pnd or cobblestoning.  No JVD or cervical adenopathy. Mild accessory muscle hypertrophy. Trachea midline, nl thryroid. Chest was hyperinflated by percussion with diminished breath sounds and moderate increased exp time with trace bilateral wheeze. Hoover sign positive at mid inspiration. Regular rate and rhythm without murmur gallop or rub or increase P2. No edema.  Abd: no hsm, nl excursion. Ext warm without cyanosis or clubbing.       CXR  Procedure date:  12/30/2009  Findings:        Comparison: 09/05/2008   Findings: Trachea is midline.  Heart is at the upper limits of normal in size.  There is mixed interstitial and airspace disease bilaterally, with small bilateral pleural effusions.   IMPRESSION: Edema versus pneumonia.  Small bilateral pleural effusions.  Impression & Recommendations:  Problem # 1:  INTRINSIC ASTHMA, UNSPECIFIED (ICD-493.10)  DDX of  difficult airways managment all start with A and  include Adherence, Ace Inhibitors, Acid Reflux, Active Sinus Disease, Alpha 1 Antitripsin deficiency, Anxiety masquerading as Airways dz,  ABPA,  allergy(esp in young), Aspiration (esp in elderly), Adverse effects of DPI,  Active smokers, plus one B  = Beta blocker use..   Adherence/abiilty to use hfa better   Already on zegerid for GERD  Next on the list of "usual suspects" is Active sinus dz not responding to doxy, try augmentin x 10 days  Problem # 2:  HYPERTENSION, BENIGN (ICD-401.1) marginal on rx which she doesn't tolerate well  symptomatically with lots of palpitations  Her updated medication list for this problem includes:    Felodipine 5 Mg Tb24 (Felodipine) ..... One at bedtime  Orders: TLB-BMP (Basic Metabolic Panel-BMET) (80048-METABOL) TLB-CBC Platelet - w/Differential (85025-CBCD)  Problem # 3:  RENAL INSUFFICIENCY (ICD-588.9) No baseline in our system. needs to avoid nsaids and recheck in 2 weeks if this is change in baseline  Medications Added to Medication List This Visit: 1)  Augmentin 875-125 Mg Tabs (Amoxicillin-pot clavulanate) .... By mouth twice daily 2)  Prednisone 10 Mg Tabs (Prednisone) .... 4 each am x 2 days,  3 x 2days, 2x2 days, and 1x2 days  Other Orders: T-2 View CXR (71020TC) Est. Patient Level IV (04540)  Patient Instructions: 1)  Stop doxy and take augmentin one twice daily since you've taken it safely and effectively in the past 2)  Prednisone 4 each am x 2 days,  3 x 2days, 2x2 days, and 1x2 days  3)  Work on  inhaler technique:  relax and blow all the way out then take a nice smooth deep breath back in, triggering the inhaler at same time you start breathing in 4)  if condition worsens go to ER Prescriptions: PREDNISONE 10 MG  TABS (PREDNISONE) 4 each am x 2 days,  3 x 2days, 2x2 days, and 1x2 days  #20 x 0   Entered and Authorized by:   Nyoka Cowden MD   Signed by:   Nyoka Cowden MD on 12/30/2009   Method used:   Electronically to        [facility]  [address] [id number]* (retail)       [address]       [place], [place]  [address]       Ph: [phone] or [phone]       Fax: [phone]   RxID:   [id number] AUGMENTIN 875-125 MG  TABS (AMOXICILLIN-POT CLAVULANATE) By mouth twice daily  #20 x 0   Entered and Authorized by:   Nyoka Cowden MD   Signed by:   Nyoka Cowden MD on 12/30/2009   Method used:   Electronically to        [facility]  [address] [id number]* (retail)       [address]       [place],  [place]  [address]       Ph: [phone] or [phone]       Fax: [phone]   RxID:   [id number]

## 2011-02-06 ENCOUNTER — Ambulatory Visit: Payer: Self-pay | Admitting: Internal Medicine

## 2011-02-12 NOTE — Procedures (Signed)
Summary: EGD  EGD   Imported By: Sherian Rein 02/06/2011 08:27:30  _____________________________________________________________________  External Attachment:    Type:   Image     Comment:   External Document

## 2011-03-18 LAB — COMPREHENSIVE METABOLIC PANEL
ALT: 11 U/L (ref 0–35)
ALT: 11 U/L (ref 0–35)
AST: 16 U/L (ref 0–37)
AST: 16 U/L (ref 0–37)
Albumin: 3.2 g/dL — ABNORMAL LOW (ref 3.5–5.2)
Albumin: 3.4 g/dL — ABNORMAL LOW (ref 3.5–5.2)
Alkaline Phosphatase: 33 U/L — ABNORMAL LOW (ref 39–117)
Alkaline Phosphatase: 34 U/L — ABNORMAL LOW (ref 39–117)
BUN: 47 mg/dL — ABNORMAL HIGH (ref 6–23)
BUN: 49 mg/dL — ABNORMAL HIGH (ref 6–23)
CO2: 24 mEq/L (ref 19–32)
Calcium: 8.8 mg/dL (ref 8.4–10.5)
Chloride: 102 mEq/L (ref 96–112)
Chloride: 96 mEq/L (ref 96–112)
GFR calc Af Amer: 12 mL/min — ABNORMAL LOW (ref >60)
GFR calc non Af Amer: 7 mL/min — ABNORMAL LOW (ref >60)
Glucose, Bld: 93 mg/dL (ref 70–99)
Potassium: 4.3 mEq/L (ref 3.5–5.1)
Sodium: 128 mEq/L — ABNORMAL LOW (ref 135–145)
Sodium: 136 mEq/L (ref 135–145)
Total Bilirubin: 0.7 mg/dL (ref 0.3–1.2)
Total Protein: 6.2 g/dL (ref 6.0–8.3)
Total Protein: 6.7 g/dL (ref 6.0–8.3)

## 2011-03-18 LAB — RENAL FUNCTION PANEL
Albumin: 3.2 g/dL — ABNORMAL LOW (ref 3.5–5.2)
Albumin: 3.4 g/dL — ABNORMAL LOW (ref 3.5–5.2)
Albumin: 3.4 g/dL — ABNORMAL LOW (ref 3.5–5.2)
BUN: 43 mg/dL — ABNORMAL HIGH (ref 6–23)
BUN: 47 mg/dL — ABNORMAL HIGH (ref 6–23)
CO2: 23 mEq/L (ref 19–32)
CO2: 26 mEq/L (ref 19–32)
Calcium: 8.4 mg/dL (ref 8.4–10.5)
Calcium: 8.6 mg/dL (ref 8.4–10.5)
Calcium: 8.9 mg/dL (ref 8.4–10.5)
Chloride: 104 mEq/L (ref 96–112)
Chloride: 97 mEq/L (ref 96–112)
Chloride: 99 mEq/L (ref 96–112)
Chloride: 99 mEq/L (ref 96–112)
Creatinine, Ser: 4.73 mg/dL — ABNORMAL HIGH (ref 0.4–1.2)
Creatinine, Ser: 4.78 mg/dL — ABNORMAL HIGH (ref 0.4–1.2)
Creatinine, Ser: 5.02 mg/dL — ABNORMAL HIGH (ref 0.4–1.2)
GFR calc Af Amer: 12 mL/min — ABNORMAL LOW (ref >60)
GFR calc Af Amer: 9 mL/min — ABNORMAL LOW (ref >60)
GFR calc non Af Amer: 10 mL/min — ABNORMAL LOW (ref >60)
GFR calc non Af Amer: 8 mL/min — ABNORMAL LOW (ref >60)
Glucose, Bld: 86 mg/dL (ref 70–99)
Glucose, Bld: 88 mg/dL (ref 70–99)
Glucose, Bld: 91 mg/dL (ref 70–99)
Phosphorus: 4.1 mg/dL (ref 2.3–4.6)
Potassium: 4.3 mEq/L (ref 3.5–5.1)
Sodium: 128 mEq/L — ABNORMAL LOW (ref 135–145)
Sodium: 133 mEq/L — ABNORMAL LOW (ref 135–145)

## 2011-03-18 LAB — HEMOGLOBIN AND HEMATOCRIT, BLOOD: Hemoglobin: 11.3 g/dL — ABNORMAL LOW (ref 12.0–15.0)

## 2011-03-18 LAB — URINALYSIS, MICROSCOPIC ONLY
Bilirubin Urine: NEGATIVE
Glucose, UA: NEGATIVE mg/dL
Ketones, ur: NEGATIVE mg/dL
Leukocytes, UA: NEGATIVE
Nitrite: NEGATIVE
Protein, ur: 30 mg/dL — AB

## 2011-03-18 LAB — URINALYSIS, ROUTINE W REFLEX MICROSCOPIC
Bilirubin Urine: NEGATIVE
Ketones, ur: NEGATIVE mg/dL
Ketones, ur: NEGATIVE mg/dL
Leukocytes, UA: NEGATIVE
Leukocytes, UA: NEGATIVE
Nitrite: NEGATIVE
Nitrite: NEGATIVE
Protein, ur: 30 mg/dL — AB
Protein, ur: 30 mg/dL — AB
Urobilinogen, UA: 0.2 mg/dL (ref 0.0–1.0)
Urobilinogen, UA: 0.2 mg/dL (ref 0.0–1.0)
pH: 5.5 (ref 5.0–8.0)
pH: 6.5 (ref 5.0–8.0)

## 2011-03-18 LAB — CBC
HCT: 30.1 % — ABNORMAL LOW (ref 36.0–46.0)
HCT: 30.3 % — ABNORMAL LOW (ref 36.0–46.0)
HCT: 32 % — ABNORMAL LOW (ref 36.0–46.0)
HCT: 32.5 % — ABNORMAL LOW (ref 36.0–46.0)
Hemoglobin: 10.1 g/dL — ABNORMAL LOW (ref 12.0–15.0)
Hemoglobin: 11 g/dL — ABNORMAL LOW (ref 12.0–15.0)
Hemoglobin: 11 g/dL — ABNORMAL LOW (ref 12.0–15.0)
MCHC: 33.9 g/dL (ref 30.0–36.0)
MCHC: 34.1 g/dL (ref 30.0–36.0)
MCHC: 34.4 g/dL (ref 30.0–36.0)
MCV: 89.6 fL (ref 78.0–100.0)
MCV: 89.6 fL (ref 78.0–100.0)
MCV: 92.1 fL (ref 78.0–100.0)
Platelets: 214 10*3/uL (ref 150–400)
Platelets: 235 10*3/uL (ref 150–400)
RBC: 3.27 MIL/uL — ABNORMAL LOW (ref 3.87–5.11)
RDW: 14.8 % (ref 11.5–15.5)
RDW: 14.9 % (ref 11.5–15.5)
RDW: 14.9 % (ref 11.5–15.5)
RDW: 15 % (ref 11.5–15.5)
WBC: 6.3 10*3/uL (ref 4.0–10.5)
WBC: 8.2 10*3/uL (ref 4.0–10.5)

## 2011-03-18 LAB — MPO/PR-3 (ANCA) ANTIBODIES

## 2011-03-18 LAB — PROTIME-INR
INR: 1.26 (ref 0.00–1.49)
Prothrombin Time: 15.7 seconds — ABNORMAL HIGH (ref 11.6–15.2)

## 2011-03-18 LAB — MAGNESIUM: Magnesium: 2.1 mg/dL (ref 1.5–2.5)

## 2011-03-18 LAB — IRON: Iron: 24 ug/dL — ABNORMAL LOW (ref 42–135)

## 2011-03-18 LAB — TSH: TSH: 6.144 u[IU]/mL — ABNORMAL HIGH (ref 0.350–4.500)

## 2011-03-18 LAB — URINE CULTURE: Culture: NO GROWTH

## 2011-03-18 LAB — C3 COMPLEMENT: C3 Complement: 70 mg/dL — ABNORMAL LOW (ref 88–201)

## 2011-03-18 LAB — URINE MICROSCOPIC-ADD ON

## 2011-03-18 LAB — GLUCOSE, CAPILLARY: Glucose-Capillary: 120 mg/dL — ABNORMAL HIGH (ref 70–99)

## 2011-03-18 LAB — APTT
aPTT: 35 seconds (ref 24–37)
aPTT: 38 seconds — ABNORMAL HIGH (ref 24–37)

## 2011-03-18 LAB — FERRITIN: Ferritin: 126 ng/mL (ref 10–291)

## 2011-03-18 LAB — C4 COMPLEMENT: Complement C4, Body Fluid: 14 mg/dL — ABNORMAL LOW (ref 16–47)

## 2011-04-10 ENCOUNTER — Ambulatory Visit (INDEPENDENT_AMBULATORY_CARE_PROVIDER_SITE_OTHER): Payer: PRIVATE HEALTH INSURANCE | Admitting: Family Medicine

## 2011-04-10 DIAGNOSIS — N19 Unspecified kidney failure: Secondary | ICD-10-CM

## 2011-04-10 DIAGNOSIS — J45909 Unspecified asthma, uncomplicated: Secondary | ICD-10-CM

## 2011-04-10 DIAGNOSIS — J309 Allergic rhinitis, unspecified: Secondary | ICD-10-CM

## 2011-04-10 DIAGNOSIS — Z79899 Other long term (current) drug therapy: Secondary | ICD-10-CM

## 2011-05-01 ENCOUNTER — Ambulatory Visit (INDEPENDENT_AMBULATORY_CARE_PROVIDER_SITE_OTHER): Payer: PRIVATE HEALTH INSURANCE | Admitting: Medical

## 2011-05-01 DIAGNOSIS — J309 Allergic rhinitis, unspecified: Secondary | ICD-10-CM

## 2011-05-01 DIAGNOSIS — N19 Unspecified kidney failure: Secondary | ICD-10-CM

## 2011-05-01 DIAGNOSIS — H6990 Unspecified Eustachian tube disorder, unspecified ear: Secondary | ICD-10-CM

## 2011-05-01 DIAGNOSIS — H6980 Other specified disorders of Eustachian tube, unspecified ear: Secondary | ICD-10-CM

## 2011-05-05 ENCOUNTER — Telehealth: Payer: Self-pay | Admitting: Medical

## 2011-05-05 NOTE — Telephone Encounter (Signed)
Make sure she is using both the Claritin and nasal spray, finish antibiotic, nasal saline flush.   As long as she is not running fever, or having worse ear/sinus pain, I would give it a little more time.  Otherwise, if worse, we can consider adding prednisone or other antibiotic.  See if she is ok with this plan.

## 2011-05-06 NOTE — Telephone Encounter (Signed)
Pt was notified of instructions per S.T-PA.  Pt stated she had some more antibiotics to finish and that she was using Claritin and the nasal spray.  Pt. Stated "ok I will give it more time."  CM, LPN

## 2011-05-12 NOTE — Assessment & Plan Note (Signed)
[facility]   NAMEDELANA, [name]               ACCOUNT NO.:  [id number]   MEDICAL RECORD NO.:  [id number]      DATE OF BIRTH:  09/20/1956   PHYSICIAN:  Theresia Majors. Tanda Rockers, [name] VISIT DATE:  05/11/2008                                   OFFICE VISIT   SUBJECTIVE:  [name] is a 55 year old female who we have followed for  a stasis ulcer with hematoma.  We have treated her with compression wrap  therapy and serial exam.  She returns for followup.  There has been no  drainage, no malodor, and no pain.   OBJECTIVE:  VITAL SIGNS:  Blood pressure is 116/70, respirations 16,  pulse rate 73, and temperature 98.3.  EXTREMITIES:  Inspection of the right lower extremity shows that the  previous posttraumatic ulceration has completely reepithelialized.  There is trace edema.   ASSESSMENT:  Resolved stasis ulcer.   PLAN:  We are discharging the patient from active followup in the wound  center.  We have given her a prescription for bilateral 30-40 mm below  the knee open toe compression hose.  We have instructed her in the  procurement and the use of these garments.  We are discharging her with  instruction to be followed up by her primary care physician.  We have  given her an opportunity to ask questions.  She seems to understand,  indicates that she will be compliant.  She expresses gratitude for  having been seen in the clinic.      Harold A. Tanda Rockers, [name]  Electronically Signed     HAN/MEDQ  D:  05/11/2008  T:  05/12/2008  Job:  [id number]   cc:   Quita Skye. Artis Flock, [name]

## 2011-05-12 NOTE — H&P (Signed)
[name], [name]               ACCOUNT NO.:  [id number]   MEDICAL RECORD NO.:  [id number]          PATIENT TYPE:  EMS   LOCATION:  MAJO                         FACILITY:  [facility]   PHYSICIAN:  Peggye Pitt, [name] DATE OF BIRTH:  December 19, 1956   DATE OF ADMISSION:  09/05/2008  DATE OF DISCHARGE:                              HISTORY & PHYSICAL   PRIMARY CARE PHYSICIAN:  Quita Skye. Kindl, [name]   CHIEF COMPLAINT:  Fatigue, fever, and myalgias.   HISTORY OF PRESENT ILLNESS:  [name] is a 55 year old white woman  with history of scleroderma who is here today with fever, weakness, and  myalgias x3 days.  On Monday, which is 2 days prior to admission, she  took her temperature and it was 102.  She, however, did not seek  evaluation until today, when she was just very fatigued and with severe  body aches.  She was found to have a pneumonia in the emergency  department and we are called to admit her.  She denies any chest pain,  shortness of breath or cough.   ALLERGIES:  She has allergies to PENICILLIN, CEPHALOSPORINS, CODEINE,  and QUINOLONES.   PAST MEDICAL HISTORY:  Significant for:  1. Scleroderma.  2. Hypothyroidism.  3. Asthma.   MEDICATIONS:  Include:  1. Felodipine extended release 5 mg p.o. b.i.d.  2. Folic acid 1 mg p.o. daily.  3. Prednisone 5 mg p.o. every other day.  4. Zegerid 40 mg p.o. daily.  5. Allegra 60 mg p.o. b.i.d.  6. Synthroid 50 mcg p.o. daily.  7. Symbicort 80/4.5 mg one puff twice daily.   SOCIAL HISTORY:  She denies any alcohol, tobacco, or illicit drug use.  She is married, has 2 daughters, works in a Dentist.   FAMILY HISTORY:  Noncontributory.   REVIEW OF SYSTEMS:  A 10-point system review was performed and is  negative except as per HPI.   PHYSICAL EXAMINATION:  VITAL SIGNS:  Upon admission, blood pressure  102/56, heart rate 82, respirations 23, and O2 sats are 90% on room air  with a temperature of 97.6.  GENERAL:  She is alert,  awake, oriented x3, not in acute distress.  HEENT:  Normocephalic, atraumatic.  Her pupils are equally reactive to  light and accommodation with intact extraocular movements.  NECK:  Supple with no JVD.  No lymphadenopathy, bruits, or goiter.  LUNGS:  She does have some rhonchi to the right base, otherwise no  crackles or wheezes.  HEART:  Regular rate and rhythm.  She does have prominent systolic  murmur, which she has never been told, she has before.  ABDOMINAL:  Soft, nontender, and nondistended.  Positive bowel sounds.  EXTREMITIES:  No edema.  Positive pedal pulses.   Labs upon admission, all are pending at this time.  She had a chest x-  ray that showed a right middle lobe and lingular infiltrate.   ASSESSMENT AND PLAN:  1. Community-acquired pneumonia.  We will check sputum and blood      cultures.  She does have allergies to penicillin, cephalosporins  and quinolones.  Because she does not look very toxic, I will give      azithromycin by itself.  Because she has had headaches, fever,      myalgias, we will also check H1N1 PCR as well as an influenza A and      B nasal swab.  Given her low O2 saturations, we will also keep on      oxygen.  2. A new systolic murmur in a young patient that is very loud.  We      will check with 2-D echo.  3. For her scleroderma, we will continue her home meds.  4. For her hypothyroidism, we will check TSH and continue her      Synthroid.  5. For her asthma, we will continue her Symbicort.      Peggye Pitt, [name]  Electronically Signed     EH/MEDQ  D:  09/05/2008  T:  09/05/2008  Job:  [id number]

## 2011-05-12 NOTE — Discharge Summary (Signed)
[name], [name]               ACCOUNT NO.:  [id number]   MEDICAL RECORD NO.:  [id number]          PATIENT TYPE:  INP   LOCATION:  5525                         FACILITY:  [facility]   PHYSICIAN:  Renee Ramus, MD       DATE OF BIRTH:  1956/12/17   DATE OF ADMISSION:  09/05/2008  DATE OF DISCHARGE:  09/06/2008                               DISCHARGE SUMMARY   PRIMARY DIAGNOSIS:  Community-acquired pneumonia.   SECONDARY DIAGNOSES:  1. Scleroderma.  2. Hypothyroid.  3. History of asthma.   HOSPITAL COURSE BY PROBLEM:  1. Community-acquired pneumonia.  The patient is a 55 year old female      who is admitted secondary to fevers, chills, cough, and increased      fatigue.  The patient was seen in the emergency department and      admitted with our service.  The patient did have infiltrates on      chest x-ray showing right lower lobe and lingular infiltrate.  She      was started on Rocephin and azithromycin.  She tolerated this well.      She is being sent home on Suprax 400 mg p.o. q.12 h. x10 days.  We      are doing this because of her allergies to penicillin and to oral      quinolones.  She has a listed allergy to cephalosporin; however,      she has received 2 doses of Rocephin with no ill effects, and so we      are going to try this therapy.  She has been warned if she develops      any signs of itching, hives, or rash, she is to stop treatment      immediately and contact her primary care doctor.  2. Scleroderma.  This has been stable throughout her admission.  She      will be continued on prednisone post discharge.  3. Hypothyroid.  The patient is therapeutic on her current      levothyroxine dosage and this will be continued post discharge.  4. Asthma.  The patient has no clinical signs or symptoms consistent      with asthma exacerbation at this time.   LABORATORY DATA:  1. Initial leukocytosis.  White count of 13.8, increasing to 16.1 and      decreasing to 5.3.  2.  Mild anemia with a hemoglobin of 11.8 and hematocrit of 35.6.  3. INR of 1.2.  4. ESR of 21.  5. CRP of 2.4.  6. UA showing no evidence of infection.  7. Blood cultures, which are currently pending but show no growth      today.   STUDIES:  Two-view chest showing right middle lobe and lingular  infiltrate with some volume loss consistent atelectatic pneumonia.   MEDICATIONS ON DISCHARGE:  1. Felodipine ER 5 mg p.o. b.i.d.  2. Folic acid 1 mg p.o. daily.  3. Prednisone 5 mg p.o. daily.  4. Zegerid 40 mg p.o. daily.  5. Allegra 60 mg p.o. b.i.d.  6. Synthroid 50 mcg p.o. daily.  7. Symbicort 80/4.5 one inhalation b.i.d.  8. Suprax 400 mg p.o. b.i.d. x10 days.   There are no labs or studies pending at the time of discharge.  The  patient is in stable condition and anxious for discharge.   Time spent:  35 minutes.      Renee Ramus, MD  Electronically Signed     JF/MEDQ  D:  09/06/2008  T:  09/07/2008  Job:  [id number]   cc:   Quita Skye. Artis Flock, [name]

## 2011-05-12 NOTE — Consult Note (Signed)
NAMEEVIN, CHIRCO               ACCOUNT NO.:  [id number]   MEDICAL RECORD NO.:  [id number]          PATIENT TYPE:  REC   LOCATION:  FOOT                         FACILITY:  [facility]   PHYSICIAN:  Maxwell Caul, M.D.DATE OF BIRTH:  Aug 02, 1956   DATE OF CONSULTATION:  04/09/2008  DATE OF DISCHARGE:                                 CONSULTATION   LOCATION:  [facility].   HISTORY:  Ms. Blackwelder is a patient, who was kindly referred from Dr.  Fayrene Fearing Kindle's office for review of a traumatic wound on her right lower  leg.   According to Ms. Ezzard Standing, she suffered trauma to the leg while playing  with her daughter in February slightly after Valentine's day.  There was  immediate significant pain to the area and swelling.  She saw the doctor  early in March, and by March 08, 2008, the scab on the surface of this  had come off leaving a wound.  The wound has been gradually improving.  She has been applying Bactroban to this and covering with a dry  dressing.  However, there is a delay in the wound healing, and she is  here for review of this.   PAST MEDICAL HISTORY:  Includes:  1. Asthma.  2. Scleroderma, for which she takes methotrexate 2.5 four tablets      weekly, and is also on prednisone one every other day.  3. She is also hypothyroid.  4. Has allergic rhinitis.   MEDICATIONS:  List is reviewed, and the prednisone and methotrexate are  as noted above.  She also takes inhaled steroids for her asthma.   SOCIAL HISTORY:  The patient works in Designer, fashion/clothing 4 days a week, spends a  fair amount of time on her feet.   PHYSICAL EXAMINATION:  VITAL SIGNS:  On examination, temperature 98.3,  pulse 68, respirations 16, and blood pressure 103/63.  RESPIRATORY:  She appears clinically well.  Work of breathing is normal.  There is end-inspiratory wheeze over the left lower lung at the base.  Right lung is clear.  CARDIOVASCULAR:  Heart sounds are normal.  There are no murmurs.   No  increase in jugular venous pressure.  EXTREMITIES:  Circulation and peripheral pulses are easily palpable at  the dorsalis pedis and posterior tibial.  There is no edema.   WOUND EXAM:  The wound is over the left anterior tibia.  It measures 1.4  x 1.5 x 0.2.  There is still some old resolving hematoma around this and  some edema; however, there is no infection.  The base of the wound is  granulated and somewhat dry.  There is a scant amount of eschar, but I  did not feel this needed debriding.  There is no evidence of infection.   IMPRESSION:  1. Probable traumatic wound in the setting of a resolving hematoma.      There was no evidence of ongoing ischemia.  The delay of healing is      no doubt partially due to the concomitant methotrexate use and      prednisone.  I did not feel the wound needed debriding.  There was      nothing that needed culturing.  We went ahead and applied hydrogel      and a covering dressing to this.  We have applied an Unna wrap.  I      think with these simple measures, this wound should close over.  It      should be noted that the patient had had a previous wound on the      left leg, which healed over rapidly, increasing her concern about      this wound, which has been there now for 2 months.   We will see her again in a week's time.  I am not anticipating any undue  problem in healing this.           ______________________________  Maxwell Caul, [name]     MGR/MEDQ  D:  04/09/2008  T:  04/10/2008  Job:  [id number]

## 2011-05-12 NOTE — Assessment & Plan Note (Signed)
[facility]   NAMEALICEA, [name]               ACCOUNT NO.:  [id number]   MEDICAL RECORD NO.:  [id number]      DATE OF BIRTH:  1956-05-03   PHYSICIAN:  Theresia Majors. Tanda Rockers, [name] VISIT DATE:  05/04/2008                                   OFFICE VISIT   SUBJECTIVE:  [name] is a 55 year old lady who we are following for  stasis ulcer on the right lower extremity.  She has been treated with  external compression.  She continues to be ambulatory.  Her edema is  well-controlled.  There has been no excessive drainage or malodor.   OBJECTIVE:  Blood pressure is 110/63, respirations 18, pulse rate 72,  and temperature 98.2.  Inspection of the lower extremity shows that  there is a residual punctate wound on the anterior shin.  Pedal pulses  readily palpable.  The edema is well-controlled.   ASSESSMENT:  Near complete resolution of stasis ulcer.   PLAN:  We will return the patient to an Unna boot for an additional  week.  We will reevaluate her at the end of that time.  We anticipate  that the ulcer should be completely resolved.  The patient is to procure  and bring with her next visit an extra firm support hose stocking.      Harold A. Tanda Rockers, [name]  Electronically Signed     HAN/MEDQ  D:  05/04/2008  T:  05/05/2008  Job:  [id number]

## 2011-05-12 NOTE — Assessment & Plan Note (Signed)
[facility]   NAMEFRANCYS, [name]               ACCOUNT NO.:  [id number]   MEDICAL RECORD NO.:  [id number]      DATE OF BIRTH:  [birth year]   PHYSICIAN:  Theresia Majors. Tanda Rockers, [name] VISIT DATE:  04/20/2008                                   OFFICE VISIT   SUBJECTIVE:  [name] is a 55 year old female whom we are following  for stasis ulcer.  In the interim, we have treated her with a  compression wrap utilizing hydrogel a nonadherent dressing that covered  with an Unna wrap.  She continues to be ambulatory.  There has been no  pain.  There has been no excessive drainage.   OBJECTIVE:  VITAL SIGNS:  Blood pressure 119/67, respirations 16, pulse  rate 67, and temperature 98.1.  Inspection of the right lower extremity shows that there is a well,  circumscribed, clean, spaces ulcer with healthy appearing granulation  and advance in epithelium from the periphery.  There is no evidence of  cellulitis, lymphangitis, or abscess.  The foot is warm and well  perfused.   ASSESSMENT:  Clinical improvement with Unna boot compression.   PLAN:  We will return the patient to an Radio broadcast assistant.  The patient would be  seen weekly by the nurse and in 2 weeks by the physician.      Harold A. Tanda Rockers, [name]  Electronically Signed     HAN/MEDQ  D:  04/20/2008  T:  04/20/2008  Job:  [id number]

## 2011-05-14 ENCOUNTER — Ambulatory Visit (INDEPENDENT_AMBULATORY_CARE_PROVIDER_SITE_OTHER): Payer: PRIVATE HEALTH INSURANCE | Admitting: Family Medicine

## 2011-05-14 ENCOUNTER — Encounter: Payer: Self-pay | Admitting: Family Medicine

## 2011-05-14 VITALS — BP 80/50 | HR 86 | Wt 112.0 lb

## 2011-05-14 DIAGNOSIS — H6500 Acute serous otitis media, unspecified ear: Secondary | ICD-10-CM

## 2011-05-14 DIAGNOSIS — J45909 Unspecified asthma, uncomplicated: Secondary | ICD-10-CM

## 2011-05-14 DIAGNOSIS — K440 Diaphragmatic hernia with obstruction, without gangrene: Secondary | ICD-10-CM

## 2011-05-14 MED ORDER — AZITHROMYCIN 500 MG PO TABS: 500.0000 mg | ORAL_TABLET | Freq: Every day | ORAL | Status: AC

## 2011-05-14 MED ORDER — FLUTICASONE-SALMETEROL 500-50 MCG/DOSE IN AEPB: 1.0000 | INHALATION_SPRAY | Freq: Two times a day (BID) | RESPIRATORY_TRACT | Status: DC

## 2011-05-14 NOTE — Patient Instructions (Signed)
Call Dr. Marina Goodell for followup on your esophageal stricture and 8 his hernia. Take the antibiotic. Use Afrin nasal spray mainly at night.

## 2011-05-14 NOTE — Progress Notes (Signed)
  Subjective:    Patient ID: [name], female    DOB: Jan 28, 1956, 55 y.o.   MRN: [id number]  HPI she continues to have difficulty with fullness in the ears and a popping sensation. No fever, chills, sore throat that continued difficulty with off and wheezing. She also mentions previous history of hiatus hernia and esophageal stricture requiring dilatation. She has been noted recently difficulty with swallowing and a feeling of food getting stuck. Her record was reviewed. She had multiple allergies listed however further consultation with her indicates very little difficulty. A rash is associated with the cephalosporin however intolerances to other things. She took a cough medicine and was told that when she had a rash it was due to penicillin but does not remember specifically being given penicillin.    Review of Systems     Objective:   Physical Exam both tympanic membrane show fluid behind it with slight erythema. Neck is supple without adenopathy. Throat is clear. Lungs show scattered wheezing. Cardiac exam shows regular rhythm without murmurs or gallops.        Assessment & Plan:  Bilateral serous otitis media. Hiatus hernia. Asthma. Esophageal stricture Azithromycin the. Increase Advair to 500/50. She will set up her own appointment with Dr. Marina Goodell for followup on the stricture.

## 2011-05-15 NOTE — Assessment & Plan Note (Signed)
HEALTHCARE                             PULMONARY OFFICE NOTE   NAME:Cappiello, DAREEN GUTZWILLER                      MRN:          [id number]  DATE:04/08/2007                            DOB:          [birth year]    PULMONARY EXTENDED OFFICE VISIT:   HISTORY:  Patient is a 55 year old white female, treated for scleroderma  with a combination of prednisone and methotrexate by Dr. Kellie Simmering with a  diagnosis of asthma also that preceded the scleroderma by many years and  was previously difficult to control, but who has done well on Advair  250/50 b.i.d.  She has had increasing symptoms of nasal congestion and  chest tightness, requiring albuterol for the first time this year, about  two weeks, since the weather turned warmer.  Overall, she says she can  control her symptoms by using albuterol about twice daily.  She denies  any purulent sputum, sinus pain, overt reflux symptoms, orthopnea, PND,  leg-swelling or chest discomfort.   For full report of medications, please see face sheet, dated April 08, 2007.   PHYSICAL EXAMINATION:  She is a pleasant, ambulatory, white female with  obvious scleroderma facies.  HEENT:  Otherwise unremarkable.  Oropharynx reveals no evidence of  thrush.  Significant for frequent throat-clearing.  NECK:  Without cervical adenopathy or tenderness.  Trachea is normal.  No thyromegaly.  LUNG FIELDS:  Reveal a few pops and squeaks on inspiration, minimal  wheezing on expiration with adequate air.  COR:  Regular rhythm without murmur, gallop or rub.  No increase in P2.  ABDOMEN:  Soft, benign.  EXTREMITIES:  Warm without calf tenderness, cyanosis, clubbing or edema.  There is mild sclerodactyly.   IMPRESSION:  1,  Chronic asthma has been relatively well-controlled on a  combination of Advair 250/50 b.i.d. with minimum use of albuterol until  the recent onset of what appears to be an allergic component.  She is  already on prednisone  at 5 mg per day and, therefore, probably will not  flare to the point where she needs a higher dose of steroids, as long as  she remembers the optimal role for topical therapy, which includes both  Nasonex.  I spent extra time with her going over contingency plans for  both rhinitis and asthma flare-ups, including the concept of the rule  of twos, which should not be a problem once the present allergy season  is over.  If not, I believe she might be better off switching over to  Symbicort because of problem #2.   1. Upper airway irritability with frequent throat-clearing.  She      assures me this is only a problem related to her allergies.      However, note that she has scleroderma with esophageal dysfunction      and is already taking Zegerid at bedtime.  I advised her regarding      optimal diet to control GERD and that frequent throat-clearing can      also be a symptom of reflux (as well as allergy) the difference  being, of course, that reflux is not going to be seasonal in time      when the pollen is bad.  I spent extra time trying to explain to      her the difference between the two and asked her to use her p.r.n.      Allegra to eliminate her allergy component to the postnasal      drainage.  In the meantime, avoid menthol-containing lozenges and      let me know if the problem does not resolve after the allergy      season has resolved.     Charlaine Dalton. Sherene Sires, MD, [facility]  Electronically Signed    MBW/MedQ  DD: 04/08/2007  DT: 04/08/2007  Job #: [id number]   cc:   Quita Skye. Artis Flock, [name]

## 2011-05-15 NOTE — Op Note (Signed)
[name], [name]               ACCOUNT NO.:  [id number]   MEDICAL RECORD NO.:  [id number]          PATIENT TYPE:  AMB   LOCATION:  DSC                          FACILITY:  [facility]   PHYSICIAN:  Cindee Salt, [name]       DATE OF BIRTH:  1956-06-22   DATE OF PROCEDURE:  DATE OF DISCHARGE:                               OPERATIVE REPORT   PREOPERATIVE DIAGNOSIS:  Ruptured EPL tendon, rupture ED2 with injury to  the remaining extensor tendons, scleroderma, tenosynovitis left hand.   POSTOPERATIVE DIAGNOSIS:  Ruptured EPL tendon, rupture ED2 with injury  to the remaining extensor tendons, scleroderma, tenosynovitis left hand  plus rupture EIP.   OPERATION:  Transfer of ED2 to [facility], little finger EIP to EDC, index  finger tenosynovectomy with __________ left wrist   SURGEON:  Kuzma   ASSISTANT:  Inman   ANESTHESIA:  General.   DATE OF OPERATION:  December 30, 2006.   HISTORY:  The patient is a 55 year old female with a history of  scleroderma.  She has ruptured the extensor tendons to the thumb was on  each side.  She has a marked tenosynovitis of her left wrist with a  ___________.  MRI reveals rupture of the ED2, EPL.  She is admitted for  tendon transfers for EIP to EPL, ED2 to [facility] little finger,  tenosynovectomy and ___________ left wrist.  She is aware of risks and  complications including infection, recurrence, rupture of tendons,  instability to distal radial ulnar joint, breakdown of the tendon  transfers, infection, dystrophy, injury to arteries, nerves, tendons.  In the Preoperative Area, questions were encouraged and answered.  The  extremity marked by both patient and surgeon.   PROCEDURE:  The patient is brought to the Operating Room.  A general  anesthetic carried out without difficulty.  She was prepped using  DuraPrep, supine position, left arm free.  The limb was exsanguinated  with an Esmarch bandage.  Tourniquet placed high on the arm, was  inflated to 250  mmHg.   A mid dorsal incision was made over the hand and wrist, carried down  through subcutaneous tissue.  Bleeders were electrocauterized.  Neurovascular structures identified and protected.  The marked  tenosynovitis was present distally.  This was very adherent, thick.  This was removed with both sharp and dull dissection using a rongeur.  It was noted that the [facility] to the index, middle and ring were intact.  The EIP, however, was found to be ruptured.  The ED2 was also found to  be ruptured.  A significant presence of the dorsal ulna was present  encroaching on the fourth dorsal compartment.  With a rupture of the  EIP, it was felt that there was no way to transfer for the thumb, that  this would require transfer of the ED2.  To the [facility] to the little and  the EIP to [facility] of the index with a later procedure to the thumb as  dictated by patient and findings  after the surgery.  An incision was then made after the tenosynovectomy  was performed over  the distal ulna.  Subperiosteally, the distal head  was then removed using an oscillating saw with protection to the  neurovascular structures.  The EDM was then woven through [facility] to the  little finger and sutured with 2-0 Mersilene sutures.  The EIP to [facility] of  the index was then sutured with side-to-side leaving the remnant scar  present to the EIP.  The closure over the __________ was then performed  with figure-of-eight 2-0 Mersilene sutures stabilizing the distal ulna.  The ECU was intact.  This was brought over the dorsum.  The wounds were  irrigated.  The skin closed interrupted 5-0 nylon sutures.  Sterile  compressive dressing and splint with the wrist dorsiflexed was applied.   Patient tolerated the procedure well was taken to the Recovery Room for  observation in satisfactory condition.   She will be discharged home to return to the [facility] in [place]  in one week on [place] NX.           ______________________________   Cindee Salt, [name]     GK/MEDQ  D:  12/30/2006  T:  12/30/2006  Job:  [id number]   cc:   Cindee Salt, [name]

## 2011-05-15 NOTE — Assessment & Plan Note (Signed)
[facility]                           GASTROENTEROLOGY OFFICE NOTE   NAME:Cassarino, LINDI ABRAM                      MRN:          [id number]  DATE:08/23/2006                            DOB:          06/11/1956    HISTORY:  Ms. Shetterly presents today for followup.  She is a 55 year old with  a history of scleroderma and asthma who was evaluated July 13, 2006 for  reflux disease and dysphagia.  See that dictation for details.  At that  time, she was on Zegerid 40 mg daily.  She was advised with regards to  reflux precautions.  She also underwent upper endoscopy on July 23, 2006.  She was found to have a benign peptic stricture in the region of the  gastroesophageal junction.  As well, dilated distal esophagus consistent  with dysmotility from scleroderma.  She was dilated to a maximal diameter of  18 mm and presents today for followup.  Since that time, she has remained on  Zegerid and has been adherent to reflux precautions, including elevation of  the head of the bed.  No significant heartburn or indigestion.  Some  regurgitation at night for which she wonders if she might take antiacids  periodically.  In terms of her swallowing, note, no dysphagia.  She is  pleased.   CURRENT MEDICATIONS:  1. Prednisone.  2. Folic acid.  3. Nasonex.  4. Advair.  5. Methotrexate.  6. Felodipine.  7. Nitro cream.  8. Zegerid.   PHYSICAL EXAMINATION:  GENERAL:  Well-appearing female in no acute distress.  VITAL SIGNS:  Blood pressure 100/64, heart rate 80, weight 156 pounds.  ABDOMEN:  Entirely benign.   IMPRESSION:  1. Gastroesophageal reflux disease with peptic stricture, status post      Savary dilation to a maximal diameter of 18 mm.  Currently symptom-free      post-dilation on proton pump inhibitor therapy.  2. 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   RECOMMENDATIONS:  1. Continue Zegerid or equivalent proton pump inhibitor daily.  2. Okay to  use antiacids for occasional breakthrough symptoms.  3. Resume rheumatologic care with Dr. Kellie Simmering and general medical care      with Dr. Artis Flock.  4. GI followup in one year unless interval problems with worsening reflux,      intermittent dysphagia, or other GI issues.                                   Wilhemina Bonito. Eda Keys., MD   JNP/MedQ  DD:  08/23/2006  DT:  08/24/2006  Job #:  [id number]   cc:   Aundra Dubin, MD  Quita Skye. Artis Flock, MD

## 2011-05-15 NOTE — Discharge Summary (Signed)
[place]  Patient:    [name], [name] Visit Number: [id number] MRN: [id number]          Service Type: MED Location: 2000 2038 [id number] Attending Physician:  Avie Echevaria Dictated by:   Candace Cruise. Minor, P.A. Admit Date:  07/06/2002 Discharge Date: 07/08/2002   CC:         Veneda Melter, [name] [facility] C. Andrey Campanile, [name]  Charlaine Dalton. Sherene Sires, [name] [facility]   Discharge Summary  DATE OF BIRTH:  August 25, 1956  DISCHARGE DIAGNOSES: 1. Asthma. 2. Pericarditis.  HISTORY OF PRESENT ILLNESS:  The patient is a 55 year old female with no cardiac history who was seen by pulmonary on July 3 for cough and treated with antibiotics and prednisone over the weekend.  She developed chest pain that increases with exertion and the patient also had continuous pain until given nitroglycerin by EMS.  For that reason, she was admitted for further evaluation and to rule out MI.  LABORATORY DATA:  A 2-D echocardiogram reveals overall left ventricular systolic function that was normal at 55%-65%, trivial aortic valve regurgitation.  The left atrium was mildly dilated.  There was a moderate echocardiogram-free pericardial effusion circumferential to the heart with a mild right atrial chamber collapse.  Adenosine non-invasive stress test demonstrated no ischemia.  A 12-lead EKG shows normal sinus rhythm.  Nuclear medicine myocardial perfusion study shows no evidence of reversible perfusion defect to suggest ischemia.  Fixed defect in the mid and apical anterior wall and apex consistent with breast attenuation.  WBC 13.9, hemoglobin 11.8, hematocrit 36.4, platelets 299.  INR 1.0, PTT 29. Sodium 138, potassium 3.9, chloride 108, CO2 22, glucose 114, BUN 12, creatinine 1.0, calcium 8.3.  CK 32, CK-MB 0.6, troponin-I 0.04.  HOSPITAL COURSE: #1 - RULE OUT MYOCARDIAL INFARCTION:  MI was ruled out via Cardiolite study, cardiac enzymes, and a 12-lead EKG.  #2 - ASTHMA:   This was addressed with appropriate pharmaceutical interventions.  #3 - PERICARDITIS:  This was addressed with prednisone and therefore no need for NSAIDs were required.  DISCHARGE MEDICATIONS: 1. Advair 500/50 one puff b.i.d. 2. Prilosec 20 mg q.d. 3. Zyrtec as needed. 4. Albuterol as needed. 5. Nasonex as needed. 6. Prednisone 20 mg tablets 3 tablets for three days, 2 tablets for three    days, 1 tablet for three days, and then stop.  DIET:  No restrictions.  SPECIAL INSTRUCTIONS:  No work until July 17, 2002.  FOLLOWUP:  She is to follow up with Dr. Sherene Sires in 2-3 weeks.  She is to follow up with Dr. Chales Abrahams in 2-3 weeks.  DISPOSITION/CONDITION ON DISCHARGE:  She was ruled out for an MI.  She does have asthma, which was treated appropriately.  Her pericarditis was addressed with anti-inflammatories. Dictated by:   Candace Cruise. Minor, P.A. Attending Physician:  Avie Echevaria DD:  07/25/02 TD:  07/25/02 Job: [id number]

## 2011-05-15 NOTE — Assessment & Plan Note (Signed)
[facility]                           GASTROENTEROLOGY OFFICE NOTE   [name]                      MRN:          [id number]  DATE:07/13/2006                            DOB:          06/09/1956    OFFICE CONSULTATION NOTE:  Referring physician Dr. Stacey [name].   REASON FOR CONSULTATION:  Reflux and dysphagia.   HISTORY:  This is a pleasant 55 year old white female with a history of  scleroderma and asthma who is referred through the courtesy of Dr. Kellie [name]  regarding reflux and dysphagia.  The patient was diagnosed with scleroderma  several years ago.  She has been under the care of  Dr. Kellie [name] and is  currently on Methotrexate and Prednisone.  She reports intermittent problems  with dysphagia dating back to 2003.  Problems with both solids and liquids,  though solids have transiently been impacted.  Symptoms have worsened  recently and more prominent is reflux, specifically at night when lying  supine.  She will notice burning and regurgitation.  About two months ago  she had transient food impaction.  About three weeks ago she was placed on  Zegrid which has been helpful in terms of nocturnal symptoms.  She underwent  upper endoscopy in 1993 which was essentially normal (this was to evaluate  diarrhea).  Other complaints include gas, bloating, and occasional diarrhea  or constipation, depending upon her diet.   PAST MEDICAL HISTORY:  1.  Scleroderma.  2.  Asthma.  3.  Sinus and allergy difficulties.   PAST SURGICAL HISTORY:  None.   ALLERGIES:  There are drug intolerances to AVELOX , CEPHALOSPORIN,  PENICILLIN, CODEINE, MINOCYCLINE, RIFAMPIN, and  AUGMENTIN.   CURRENT MEDICATIONS:  1.  Prednisone 5 mg daily.  2.  Methotrexate 10 mg once weekly.  3.  Folic Acid 1 mg daily.  4.  Nasonex b.i.d.  5.  Advair b.i.d.  6.  Zegrid40 mg daily.  7.  Felodipine CR 5 mg b.i.d.  8.  Nitroglycerin cream b.i.d.   FAMILY HISTORY:   No family history of gastrointestinal malignancy.  Mother  with heart disease, liver disease and an unspecified bleeding disorder.  Sister with diabetes.   SOCIAL HISTORY:  Patient is a widow with two daughters ages 61 and 70 years.  She lives with her children.  She attended high school and works in a  Education officer, environmental in [place] .  She does not smoke or use alcohol.   REVIEW OF SYSTEMS:  Per diagnostic evaluation form.   PHYSICAL EXAMINATION:  GENERAL:  Pleasant female in no acute distress.  Her  facial features have obvious changes of scleroderma.  VITAL SIGNS:  Blood pressure is 118/60.  Heart rate is 80.  Weight is 153.6  pounds.  She is 5 feet and 5 inches in height.  HEENT:  Sclerae are anicteric.  Conjunctivae are pink.  Oral mucosa is  intact.  There is no thrush.  LUNGS:  Clear.  HEART:  Regular.  ABDOMEN:  Soft without tenderness, mass or hernia.  Good bowel sounds heard.  EXTREMITIES:  Reveal sclerodactyly.   IMPRESSION:  Problems with reflux and dysphagia secondary to scleroderma.  These patients can have dysphagia on the basis of dysmotility or stricturing  secondary to fibrosis of the smooth muscle of the distal two-thirds of the  esophagus or secondary to bad reflux disease. Additionally,  candida  esophagitis can be seen as well as pseudodiverticulum of the esophagus.   RECOMMENDATIONS:  1.  Reflux precautions.  2.  Aggressive acid suppression with proton pump inhibitor therapy.  3.  Schedule upper endoscopy with probable esophageal dilation.  The nature      of the procedure as well as risks, benefits and alternatives were      reviewed.  She understood and agreed to proceed.  4.  Ongoing rheumatologic care with Dr. Kellie [name].                                   [name]. [name]., MD   JNP/MedQ  DD:  07/13/2006  DT:  07/14/2006  Job #:  [id number]   cc:   Caitlyn Dubin, MD  Caitlyn Skye. Artis Flock, MD

## 2011-05-15 NOTE — H&P (Signed)
[place]  Patient:    [name], [name] Visit Number: [id number] MRN: [id number]          Service Type: MED Location: 2000 2038 01 Attending Physician:  Avie Echevaria Dictated by:   [name], N.P. Admit Date:  07/06/2002 Discharge Date: 07/08/2002   CC:         [name], [name]   History and Physical  DATE OF BIRTH:  1956-08-21  CHIEF COMPLAINT:  Shortness of breath.  HISTORY OF PRESENT ILLNESS:  The patient is a 55 year old white female patient of Dr. Rolin [name], never smoker with a history of difficult to control asthma with underlying gastroesophageal reflux disease, chronic rhinitis, and underlying atopy.  The patient presented for an acute work-in visit for persistent shortness of breath that has worsened with minimal activity.  The patient was recently seen in the office two days ago for refractory asthmatic bronchitic symptoms despite being on a Z-Pak and prednisone taper prescribed by her primary care physician, Dr. Karma [name].  At that time she complained of persistent cough with chest tightness and wheezing.  She was started on [place] DM to her current regimen.  On return today her cough has decreased and purulent sputum has resolved.  However, she continues to have shortness of breath with inability to take a shower without completely giving out and also has shortness of breath at rest with persistent chest tightness and wheezing.  Examination in the office revealed minimal wheezing which cleared with nebulizer treatment.  Her oxygen saturation only decreased to 94% with activity.  However, she only had minimal improvement in her shortness of breath with the hand-held nebulizer.  A chest x-ray in the office today was done with no noted infiltrates, however, borderline cardiomegaly.  EKG was done with noted ST elevation in leads 1 and 2 and poor R-wave progression changes in V4 and 5.  The patient  denied any radiating chest pain, nausea, diaphoresis, palpitations, presyncopal/syncopal episodes, calf pain, or swelling.  The patient does have a family history of possible coronary artery disease in her mother which has had several strokes with a questionable recent MI.  The patients case was reviewed with Dr. Jayme [name] in Dr. Rolin [name] absence and also with Dr. Juanda Chance in cardiology.  Felt due to patients severity of symptoms and physical and EKG findings, would be best to admit patient for further evaluation to possibly rule out MI and to evaluate the cause of her significant shortness of breath.  Cardiology has been contacted for a consultation.  PAST MEDICAL HISTORY: 1. Asthma 2. GERD. 3. Status post tonsillectomy. 4. Thrombophlebitis. 5. Chronic rhinitis.  CURRENT MEDICATIONS: 1. Prilosec 20 mg b.i.d. 2. Advair 500/50 b.i.d. 3. Zyrtec p.r.n. 4. Albuterol inhaler p.r.n. 5. Nasonex p.r.n.  DRUG ALLERGIES:  AVELOX causes a rash.  FAMILY HISTORY:  Positive for CVA and questionable MI in her mother.  Lung cancer in her father who is deceased.  Diabetes in a sister.  SOCIAL HISTORY:  The patient is widowed.  Has two children ages 34 and 77. She currently works at a [employer].  She is a never smoker and denies any drug or alcohol use.  REVIEW OF SYSTEMS:  HEENT:  Negative for headache, syncope. CARDIAC/PULMONARY:  Negative for orthopnea/PND.  GASTROINTESTINAL:  No nausea, vomiting, diarrhea, melena.  SKIN:  Without rash.  PHYSICAL EXAMINATION  GENERAL:  The patient is a very pleasant, well-developed, well-nourished white female in no acute  distress.  VITAL SIGNS:  Temperature 97.8, blood pressure 118/68, pulse regular at 106 beats per minute, O2 saturation 96% on room air, decreased to 94% with activity, weight 176 pounds.  HEENT:  PERRL.  Conjunctivae and sclerae are clear.  Posterior pharynx is clear without any exudate.  TMs are normal.  Nasal mucosa is pink  and moist.  NECK:  Supple without adenopathy.  No JVD.  Carotids are equal with positive upstrokes bilaterally without any bruits.  No thyromegaly appreciated.  LUNGS:  Sounds are clear to auscultation bilaterally.  Initially has a few scattered wheezes which clear with nebulizer treatment.  CARDIAC:  Regular rate and rhythm with a normal S1, S2 without murmur, rub, or gallop.  PMI nondisplaced.  ABDOMEN:  Bowel sounds are positive throughout all four quadrants.  No hepatosplenomegaly.  No guarding or rebound noted.  No abdominal bruits or masses appreciated.  GENITAL:  Deferred.  RECTAL:  Deferred.  EXTREMITIES:  Warm without any calf tenderness, cyanosis, clubbing, or edema. Pulses are intact.  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  NEUROLOGIC:  Alert and oriented x3.  No focal deficits detected.  DATA:  Chest x-ray revealed no acute infiltrates.  There is a borderline cardiomegaly.  EKG revealed a normal sinus rhythm with a rate of 98.  There is some ST elevation in leads 1 and 2 and poor R-wave progression in V4-V5.  IMPRESSION AND PLAN: 1. Atypical chest pain and tightness with EKG changes.  The patient will be    admitted to [facility] to rule out myocardial infarction.    Cardiology consult has been obtained and will evaluate for possible    treatment.  This case was discussed with Dr. Juanda Chance, also the hospital    cardiologist has been paged.  The patient has been started on intravenous    heparin.  Nitroglycerin sublingual as needed.  Aspirin was given in the    office today.  Cardiac enzymes including laboratories including CBC, PT,    and PTT are currently pending.  Will follow up accordingly.  EKG,    echocardiogram, and chest x-ray also will be done upon arrival. 2. Recent asthmatic bronchitis and persistent dyspnea disproportionate to    physical findings.  If cardiac enzymes and 2-D  echocardiogram are     unremarkable, will need to further evaluate with possible CT of the chest    to rule out possibly underlying pulmonary embolism. Dictated by:   [name], N.P. Attending Physician:  Avie Echevaria DD:  07/06/02 TD:  07/10/02 Job: [id number]

## 2011-05-29 ENCOUNTER — Ambulatory Visit: Payer: PRIVATE HEALTH INSURANCE | Admitting: Internal Medicine

## 2011-06-26 ENCOUNTER — Ambulatory Visit (INDEPENDENT_AMBULATORY_CARE_PROVIDER_SITE_OTHER): Payer: PRIVATE HEALTH INSURANCE | Admitting: Internal Medicine

## 2011-06-26 ENCOUNTER — Encounter: Payer: Self-pay | Admitting: Internal Medicine

## 2011-06-26 VITALS — BP 100/60 | HR 60 | Ht 65.0 in | Wt 114.8 lb

## 2011-06-26 DIAGNOSIS — K219 Gastro-esophageal reflux disease without esophagitis: Secondary | ICD-10-CM

## 2011-06-26 DIAGNOSIS — R1310 Dysphagia, unspecified: Secondary | ICD-10-CM

## 2011-06-26 DIAGNOSIS — K222 Esophageal obstruction: Secondary | ICD-10-CM

## 2011-06-26 DIAGNOSIS — M349 Systemic sclerosis, unspecified: Secondary | ICD-10-CM

## 2011-06-26 NOTE — Patient Instructions (Signed)
EGD [facility] 07/10/11 8:30 am arrive at 7:30 am  EGD brochure given for you to review.

## 2011-06-26 NOTE — Progress Notes (Signed)
HISTORY OF PRESENT ILLNESS:  [name] is a 55 y.o. female with a history of scleroderma, esophageal dysmotility with subsequent reflux esophagitis and peptic stricture requiring esophageal dilation. Also problems with asthma and renal insufficiency. She presents today regarding recurrent dysphagia 6 months duration. Mostly intermittent solid food dysphagia. Some liquid dysphagia. She last underwent upper endoscopy with esophageal dilation July 2007. She had been maintained on PPI until some point when she switched to H2 receptor antagonist therapy. Patient has not been seen in his office for little lower 3 years. She denies reflux symptoms. No other GI complaints. She has had fluctuating weight overall weight loss. She denies lower GI symptoms. She was previously instructed to seriously contemplate screening colonoscopy but has yet to do so.  REVIEW OF SYSTEMS:  All non-GI ROS negative except for intermittent wheezing, fatigue  Past Medical History  Diagnosis Date  . Asthma   . Scleroderma   . Renal failure   . Esophageal stricture     Past Surgical History  Procedure Date  . Hand surgery     left; tendon repair    Social History [name]  reports that she has never smoked. She has never used smokeless tobacco. She reports that she does not drink alcohol or use illicit drugs.  family history includes Heart failure in her mother and Hepatitis in her mother.  There is no history of Colon cancer.  Allergies  Allergen Reactions  . Cephalosporins   . Avelox (Moxifloxacin Hcl In Nacl)     Caused tachycardia  . Codeine        PHYSICAL EXAMINATION: Vital signs: BP 100/60  Pulse 60  Ht 5\' 5"  (1.651 m)  Wt 114 lb 12.8 oz (52.073 kg)  BMI 19.10 kg/m2 General: Thin female with obvious scleroderma the bases, no acute distress HEENT: Sclerae are anicteric, conjunctiva pink. Oral mucosa intact Lungs: Clear with end expiratory wheeze Heart: Regular Abdomen: soft,  nontender, nondistended, no obvious ascites, no peritoneal signs, normal bowel sounds. No organomegaly. Extremities: No edema Psychiatric: alert and oriented x3. Cooperative     ASSESSMENT:  #1. Recurrent dysphagia likely secondary to recurrent stricture of the esophagus #2. Scleroderma with esophageal dysmotility #3. GERD related to esophageal dysmotility #4. Colon cancer screening. Again discussed with the patient the importance. She remained unwilling to commit. She should discuss this further with her PCP   PLAN:  #1. Continue H2 receptor antagonist. May need to resume PPI therapy pending the results of endoscopy #2. Schedule upper endoscopy with esophageal dilation.The nature of the procedure, as well as the risks, benefits, and alternatives were carefully and thoroughly reviewed with the patient. Ample time for discussion and questions allowed. The patient understood, was satisfied, and agreed to proceed.  #3. Total screening colonoscopy if ready to commit at some point in the future

## 2011-06-29 ENCOUNTER — Encounter: Payer: Self-pay | Admitting: Internal Medicine

## 2011-07-10 ENCOUNTER — Ambulatory Visit (HOSPITAL_COMMUNITY): Payer: PRIVATE HEALTH INSURANCE

## 2011-07-10 ENCOUNTER — Encounter: Payer: PRIVATE HEALTH INSURANCE | Admitting: Internal Medicine

## 2011-07-10 ENCOUNTER — Ambulatory Visit (HOSPITAL_COMMUNITY)
Admission: RE | Admit: 2011-07-10 | Discharge: 2011-07-10 | Disposition: A | Discharge status: Home / Self Care | Payer: PRIVATE HEALTH INSURANCE | Source: Ambulatory Visit | Attending: Internal Medicine | Admitting: Internal Medicine

## 2011-07-10 DIAGNOSIS — Z79899 Other long term (current) drug therapy: Secondary | ICD-10-CM | POA: Insufficient documentation

## 2011-07-10 DIAGNOSIS — R1310 Dysphagia, unspecified: Secondary | ICD-10-CM | POA: Insufficient documentation

## 2011-07-10 DIAGNOSIS — K208 Other esophagitis without bleeding: Secondary | ICD-10-CM | POA: Insufficient documentation

## 2011-07-10 DIAGNOSIS — M349 Systemic sclerosis, unspecified: Secondary | ICD-10-CM | POA: Insufficient documentation

## 2011-07-10 DIAGNOSIS — K222 Esophageal obstruction: Secondary | ICD-10-CM | POA: Insufficient documentation

## 2011-07-10 DIAGNOSIS — J45909 Unspecified asthma, uncomplicated: Secondary | ICD-10-CM | POA: Insufficient documentation

## 2011-07-10 DIAGNOSIS — K449 Diaphragmatic hernia without obstruction or gangrene: Secondary | ICD-10-CM | POA: Insufficient documentation

## 2011-09-30 LAB — CULTURE, BLOOD (ROUTINE X 2): Culture: NO GROWTH

## 2011-09-30 LAB — CBC
HCT: 36.5
HCT: 37.8
Hemoglobin: 11.9 — ABNORMAL LOW
Hemoglobin: 12.2
MCHC: 33.3
MCHC: 33.4
MCHC: 33.5
MCV: 94.6
MCV: 94.8
Platelets: 246
Platelets: 263
RBC: 3.76 — ABNORMAL LOW
RDW: 13
RDW: 13.1
WBC: 13.8 — ABNORMAL HIGH

## 2011-09-30 LAB — BASIC METABOLIC PANEL
CO2: 25
Chloride: 114 — ABNORMAL HIGH
GFR calc Af Amer: >60
Potassium: 3.8
Sodium: 144

## 2011-09-30 LAB — URINALYSIS, ROUTINE W REFLEX MICROSCOPIC
Glucose, UA: NEGATIVE
Protein, ur: NEGATIVE
Specific Gravity, Urine: 1.013
Urobilinogen, UA: 1

## 2011-09-30 LAB — POCT I-STAT, CHEM 8
BUN: 13
Creatinine, Ser: 1
Glucose, Bld: 98
Hemoglobin: 13.3
Sodium: 142
TCO2: 27

## 2011-09-30 LAB — DIFFERENTIAL
Basophils Absolute: 0
Basophils Relative: 0
Eosinophils Absolute: 0.1
Eosinophils Relative: 1
Lymphs Abs: 1
Neutrophils Relative %: 91 — ABNORMAL HIGH

## 2011-09-30 LAB — COMPREHENSIVE METABOLIC PANEL
BUN: 10
Calcium: 8.7
Creatinine, Ser: 1.01
Glucose, Bld: 104 — ABNORMAL HIGH
Total Protein: 6.5

## 2011-09-30 LAB — T4, FREE: Free T4: 1.05

## 2011-09-30 LAB — SEDIMENTATION RATE: Sed Rate: 21

## 2011-09-30 LAB — C-REACTIVE PROTEIN: CRP: 2.4 — ABNORMAL HIGH (ref <0.6)

## 2011-09-30 LAB — URINE CULTURE

## 2011-11-18 ENCOUNTER — Encounter: Payer: Self-pay | Admitting: Medical

## 2011-11-18 ENCOUNTER — Ambulatory Visit (INDEPENDENT_AMBULATORY_CARE_PROVIDER_SITE_OTHER): Payer: PRIVATE HEALTH INSURANCE | Admitting: Medical

## 2011-11-18 VITALS — BP 100/60 | HR 80 | Temp 97.9°F | Resp 16 | Wt 114.5 lb

## 2011-11-18 DIAGNOSIS — J310 Chronic rhinitis: Secondary | ICD-10-CM | POA: Insufficient documentation

## 2011-11-18 DIAGNOSIS — J329 Chronic sinusitis, unspecified: Secondary | ICD-10-CM

## 2011-11-18 DIAGNOSIS — J45909 Unspecified asthma, uncomplicated: Secondary | ICD-10-CM

## 2011-11-18 MED ORDER — AZITHROMYCIN 250 MG PO TABS: ORAL_TABLET | ORAL | Status: AC

## 2011-11-18 MED ORDER — ALBUTEROL SULFATE HFA 108 (90 BASE) MCG/ACT IN AERS: 2.0000 | INHALATION_SPRAY | Freq: Four times a day (QID) | RESPIRATORY_TRACT | Status: DC | PRN

## 2011-11-18 MED ORDER — METHYLPREDNISOLONE 4 MG PO KIT: PACK | ORAL | Status: AC

## 2011-11-18 NOTE — Progress Notes (Signed)
Subjective:   HPI  [name] is a 55 y.o. female who presents with a cold.  Started coughing this past Friday 6 days ago, but now feels much worse, awful, hurts all over, chest congestion, coughing, nonproductive, some diarrhea, heavy feeling, sinus pressure, cheek pain, ear pain, but no sore throat, no nausea or vomiting.  Several sick contacts at work.  Asthma is flared up currently, tight chest.  Using Tylenol cold but this makes her feel bad. No other aggravating or relieving factors.  No other c/o.  The following portions of the patient's history were reviewed and updated as appropriate: allergies, current medications, past family history, past medical history, past social history, past surgical history and problem list.  Past Medical History  Diagnosis Date  . Asthma   . Scleroderma   . Renal failure   . Esophageal stricture    Review of Systems Constitutional: +fever, +chills, +sweats, -unexpected -weight change,+fatigue ENT: -runny nose, +ear pain, -sore throat Cardiology:  -chest pain, -palpitations, -edema Respiratory: +cough, +shortness of breath, +wheezing Gastroenterology: -abdominal pain, -nausea, -vomiting, +diarrhea, -constipation Hematology: -bleeding or bruising problems Musculoskeletal: -arthralgias, -myalgias, -joint swelling, -back pain Ophthalmology: -vision changes Urology: -dysuria, -difficulty urinating, -hematuria, -urinary frequency, -urgency Neurology: +headache, -weakness, -tingling, -numbness    Objective:   Filed Vitals:   11/18/11 1329  BP: 100/60  Pulse: 80  Temp: 97.9 F (36.6 C)  Resp: 16    General appearance: Alert, WD/WN, no distress, ill appearing                             Skin: warm, no rash, no diaphoresis                           Head: maxillary left sinus tenderness                            Eyes: conjunctiva normal, corneas clear, PERRLA                            Ears: pearly TMs, external ear canals normal             Nose: septum midline, turbinates swollen, with erythema and clear discharge             Mouth/throat: MMM, tongue normal, mild pharyngeal erythema                           Neck: supple, no adenopathy, no thyromegaly, nontender                          Heart: RRR, normal S1, S2, no murmurs                         Lungs: +bronchial breath sounds, +scattered rhonchi, + wheezes, no rales                Extremities: no edema, nontender     Assessment and Plan:   Encounter Diagnoses  Name Primary?  [name] Asthmatic bronchitis Yes  . Sinusitis    Prescription given today for Zpak and Medrol Dose pak as below.  Refilled her Proair.  Discussed diagnosis and treatment.  Suggested symptomatic OTC remedies for cough and congestion.  Nasal  saline spray for nasal congestion.  Tylenol  OTC for fever and malaise.  Call/return in 2-3 days if symptoms are worse or not improving.      Advised she f/u soon for physical.  Records release requested from rheum/Dr. Kellie Simmering.

## 2011-11-18 NOTE — Patient Instructions (Signed)
Acute Bronchitis Bronchitis is a problem of the air tubes leading to your lungs. Acute means the illness started quickly. In this condition, the lining of those tubes becomes puffy (swollen) and can leak fluid. This makes it harder for air to get in and out of your lungs. You may cough a lot. This is because the air tubes are narrow. Bronchitis is most often caused by a virus. Medicines that kill germs (antibiotics) may be needed with germ (bacteria) infections for people who:  Smoke.   Have lasting (chronic) lung problems.   Are elderly.  HOME CARE  Rest.   Drink enough water and fluids to keep the pee clear or pale yellow.   Only take medicine as told by your doctor.   Medicines may be prescribed that will open up the airways. This will help make breathing easier.   Bronchitis usually gets better on its own in a few days.  Recovery from some problems (symptoms) of bronchitis may be slow. You should start feeling a little better after 2 to 3 days. Coughing may last for 3 to 4 weeks. GET HELP RIGHT AWAY IF:  You or your child has a temperature by mouth above 101, not controlled by medicine.   Chills or chest pain develops.   You or your child develops very bad shortness of breath.   There is bloody saliva mixed with mucus (sputum).   You or your child throws up (vomits) often, loses too much fluid (dehydration), feels faint, or has a very bad headache.   You or your child does not improve after 1 week of treatment.  MAKE SURE YOU:   Understand these instructions.   Will watch this condition.   Will get help right away if you or your child is not doing well or gets worse.  Document Released: 06/01/2008 Document Re-Released: 03/10/2010 [employer] Patient Information 2011 [employer].  

## 2011-11-27 ENCOUNTER — Telehealth: Payer: Self-pay | Admitting: Medical

## 2011-11-27 ENCOUNTER — Other Ambulatory Visit: Payer: Self-pay | Admitting: Medical

## 2011-11-27 DIAGNOSIS — Z23 Encounter for immunization: Secondary | ICD-10-CM

## 2011-11-27 NOTE — Telephone Encounter (Signed)
Handwritten script ready

## 2011-11-27 NOTE — Telephone Encounter (Signed)
DONE

## 2011-12-03 ENCOUNTER — Telehealth: Payer: Self-pay | Admitting: Internal Medicine

## 2011-12-03 ENCOUNTER — Encounter: Payer: Self-pay | Admitting: Internal Medicine

## 2011-12-03 ENCOUNTER — Other Ambulatory Visit: Payer: Self-pay | Admitting: Medical

## 2011-12-03 MED ORDER — AMOXICILLIN-POT CLAVULANATE 875-125 MG PO TABS: 1.0000 | ORAL_TABLET | Freq: Two times a day (BID) | ORAL | Status: AC

## 2011-12-03 NOTE — Telephone Encounter (Signed)
Augmentin sent.   If not much better in the next few days, then recheck OV.

## 2011-12-03 NOTE — Telephone Encounter (Signed)
Pt said she has had asthma for 20 some years and the only thing that will do the trick is prednisone

## 2011-12-03 NOTE — Telephone Encounter (Signed)
i had just given her a steroid dose pack at the most recent visit along with the antibiotic.  If this didn't help, I would recommend she recheck.

## 2011-12-04 ENCOUNTER — Encounter: Payer: Self-pay | Admitting: Medical

## 2011-12-04 ENCOUNTER — Ambulatory Visit (INDEPENDENT_AMBULATORY_CARE_PROVIDER_SITE_OTHER): Payer: PRIVATE HEALTH INSURANCE | Admitting: Medical

## 2011-12-04 VITALS — BP 100/60 | HR 72 | Temp 98.2°F | Resp 16 | Wt 117.0 lb

## 2011-12-04 DIAGNOSIS — Z23 Encounter for immunization: Secondary | ICD-10-CM

## 2011-12-04 DIAGNOSIS — K219 Gastro-esophageal reflux disease without esophagitis: Secondary | ICD-10-CM

## 2011-12-04 DIAGNOSIS — J45901 Unspecified asthma with (acute) exacerbation: Secondary | ICD-10-CM

## 2011-12-04 MED ORDER — DEXLANSOPRAZOLE 60 MG PO CPDR: 60.0000 mg | DELAYED_RELEASE_CAPSULE | Freq: Every day | ORAL | Status: AC

## 2011-12-04 MED ORDER — METHYLPREDNISOLONE ACETATE 40 MG/ML IJ SUSP
80.0000 mg | Freq: Once | INTRAMUSCULAR | Status: AC
  Administered 2011-12-04: 80 mg via INTRAMUSCULAR

## 2011-12-04 NOTE — Progress Notes (Signed)
Subjective:   HPI  [name] is a 55 y.o. female who presents for recheck.  I saw her recently on 11/18/11 for sinusitis and asthmatic bronchitis.  She had 6 day hx/o symptoms at that time.  She used a course of Zpak, steroid dose pack and albuterol at that time.  She is here again today because although the bronchitis and sinus infection improved after that course, about a week later til now her reflux flared up, got her coughing again.  She is using the albuterol 3-4 times daily for now.  She also notes right ear pressure, some on the left.  No fever.  She is getting some clear and foamy mucous.  Using Robitussin OTC. In general GERD is fairly well controlled with 20mg  Prilosec twice daily, and she has hx/o esophageal dilation in the summer.  However, recently had Thanksgiving meal and this flared up her GERD.  Prior to the 11/18/11 illness, not using the inhaler but prn.  She does use her Qvar daily.  No other aggravating or relieving factors.    No other c/o.  The following portions of the patient's history were reviewed and updated as appropriate: allergies, current medications, past family history, past medical history, past social history, past surgical history and problem list.  Past Medical History  Diagnosis Date  . Asthma   . Scleroderma   . Renal failure   . Esophageal stricture   . Hypertension   . Allergic rhinitis   . GERD (gastroesophageal reflux disease)    Review of Systems Constitutional: -fever, -chills, -sweats, -unexpected -weight change,-fatigue ENT: -runny nose, +ear pain, -sore throat Cardiology:  -chest pain, -palpitations, -edema Respiratory: +cough, -shortness of breath, +wheezing Gastroenterology: -abdominal pain, -nausea, -vomiting, -diarrhea, -constipation Hematology: -bleeding or bruising problems Musculoskeletal: -arthralgias, -myalgias, -joint swelling, -back pain Ophthalmology: -vision changes Urology: -dysuria, -difficulty urinating, -hematuria,  -urinary frequency, -urgency Neurology: +headache, -weakness, -tingling, -numbness      Objective:   Physical Exam  Filed Vitals:   12/04/11 0854  BP: 100/60  Pulse: 72  Temp: 98.2 F (36.8 C)  Resp: 16    General appearance: alert, no distress, WD/WN, thin white female HEENT: normocephalic, sclerae anicteric, TMs pearly, nares patent, no discharge or erythema, pharynx normal Oral cavity: MMM, no lesions Neck: supple, no lymphadenopathy, no thyromegaly, no masses Heart: RRR, normal S1, S2, no murmurs Lungs: few scattered wheezes, decreased breath sounds, few rhonchi, but normal percussion, no rales Abdomen: +bs, soft, non tender, non distended, no masses, no hepatomegaly, no splenomegaly Pulses: 2+ symmetric, upper and lower extremities, normal cap refill Ext: no edema, cyanosis or clubbing  Assessment and Plan :    Encounter Diagnoses  Name Primary?  [name] Asthma exacerbation Yes  . GERD (gastroesophageal reflux disease)   . Need for pneumococcal vaccination    Asthma - Solumedrol 80mg  IM given in office.  C/t Qvar, albuterol inhaler prn, rest, hydrate well, avoid triggers.  If much worse or recurrent of sinus/bronchitis symptoms as she had just 1.5 wk ago, can begin Augmentin, but symptoms and exam suggest asthma flare only today.  GERD - trial of Dexilant, stop prilosec for now.  Call report 10 days.  Avoid trigger foods.   Pneumococcal vaccines, VIS counseling given today.    Return soon for physical, bone density, mammogram, preventative care.   Call report 10 days.

## 2011-12-04 NOTE — Progress Notes (Signed)
Addended by: Janeice Robinson on: 12/04/2011 02:24 PM   Modules accepted: Orders

## 2011-12-04 NOTE — Patient Instructions (Signed)
Return soon for physical, preventative care. If worse over the weekend with fever and colored sputum, begin Augmentin (sent to pharmacy yesterday).  Begin trial of Dexilant instead of Prilosec for the next 10 days to see if it helps your reflux.  Dexilant is once daily in the morning on empty stomach.   Continue you other medications as usual.

## 2011-12-04 NOTE — Telephone Encounter (Signed)
Pt coming 12/7

## 2012-03-18 ENCOUNTER — Ambulatory Visit (INDEPENDENT_AMBULATORY_CARE_PROVIDER_SITE_OTHER): Payer: PRIVATE HEALTH INSURANCE | Admitting: Medical

## 2012-03-18 ENCOUNTER — Encounter: Payer: Self-pay | Admitting: Medical

## 2012-03-18 VITALS — BP 100/60 | HR 72 | Temp 98.1°F | Resp 16 | Wt 116.0 lb

## 2012-03-18 DIAGNOSIS — J45909 Unspecified asthma, uncomplicated: Secondary | ICD-10-CM

## 2012-03-18 DIAGNOSIS — H6690 Otitis media, unspecified, unspecified ear: Secondary | ICD-10-CM

## 2012-03-18 MED ORDER — AZITHROMYCIN 500 MG PO TABS: 500.0000 mg | ORAL_TABLET | Freq: Every day | ORAL | Status: DC

## 2012-03-18 MED ORDER — BECLOMETHASONE DIPROPIONATE 80 MCG/ACT IN AERS: 2.0000 | INHALATION_SPRAY | Freq: Two times a day (BID) | RESPIRATORY_TRACT | Status: DC

## 2012-03-18 NOTE — Progress Notes (Signed)
Subjective:  [name] is a 56 y.o. female who presents for sinus issue.   She notes seeing her kidney doctor 22mo ago and at that time had been having ear fullness.  He told her to use Afrin every other day and Flonase BID.  He prescribed the Flonase.  She continues to have ear issues.  She is on Claritin every other day as well.  In the last several days though she has developed a raw throat, ear pain bilat, nasal congestion, sinus pressure, thick green nasal drainage with blood, cough with clear production, and some wheezing.  Denies sick contacts.   Has been a little wheezy.  In general uses albuterol inhaler periodically with Qvar BID for control.   Using the albuterol a little more the last few day after they did spring cleaning at school.  Needs refill on Qvar.  No other aggravating or relieving factors.  No other c/o.  Past Medical History  Diagnosis Date  . Asthma   . Scleroderma   . Esophageal stricture   . Hypertension   . Allergic rhinitis   . GERD (gastroesophageal reflux disease)   . Renal insufficiency    ROS Gen: no fever, chills Lungs: mild wheezing and SOB Herat: no CP, palpations, edema GI: no pain, NVD   Objective:   Filed Vitals:   03/18/12 0948  BP: 100/60  Pulse: 72  Temp: 98.1 F (36.7 C)  Resp: 16    General appearance: Alert, WD/WN, no distress                             Skin: warm, no rash                           Head: +frontal sinus tenderness,                            Eyes: conjunctiva normal, corneas clear, PERRLA                            Ears: bilat erythema of TMs, external ear canals normal                          Nose: septum midline, turbinates swollen, with erythema, no discharge             Mouth/throat: MMM, tongue normal, mild pharyngeal erythema                           Neck: supple, no adenopathy, no thyromegaly, nontender                          Heart: RRR, normal S1, S2, no murmurs                         Lungs: few  wheezes, no rales, or rhonchi      Assessment and Plan:   Encounter Diagnoses  Name Primary?  . Otitis media Yes  . Asthma    Prescription given for Azithromycin.  Refilled her Qvar.  Cautioned on using the Afrin so much.  She will wean off this to periodic use only.  Discussed symptomatic relief, nasal saline, and call or return  if worse or not improving in 2-3 days.

## 2012-06-27 ENCOUNTER — Ambulatory Visit (INDEPENDENT_AMBULATORY_CARE_PROVIDER_SITE_OTHER): Payer: PRIVATE HEALTH INSURANCE | Admitting: Medical

## 2012-06-27 ENCOUNTER — Encounter: Payer: Self-pay | Admitting: Medical

## 2012-06-27 ENCOUNTER — Other Ambulatory Visit (HOSPITAL_COMMUNITY): Payer: Self-pay | Admitting: *Deleted

## 2012-06-27 VITALS — BP 92/60 | Temp 98.1°F | Resp 16 | Wt 113.0 lb

## 2012-06-27 DIAGNOSIS — J45901 Unspecified asthma with (acute) exacerbation: Secondary | ICD-10-CM

## 2012-06-27 DIAGNOSIS — T148XXA Other injury of unspecified body region, initial encounter: Secondary | ICD-10-CM

## 2012-06-27 DIAGNOSIS — W57XXXA Bitten or stung by nonvenomous insect and other nonvenomous arthropods, initial encounter: Secondary | ICD-10-CM

## 2012-06-27 MED ORDER — METHYLPREDNISOLONE 4 MG PO KIT: PACK | ORAL | Status: AC
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
## 2012-06-28 ENCOUNTER — Encounter (HOSPITAL_COMMUNITY)
Admission: RE | Admit: 2012-06-28 | Discharge: 2012-06-28 | Disposition: A | Discharge status: Home / Self Care | Payer: PRIVATE HEALTH INSURANCE | Source: Ambulatory Visit | Attending: Nephrology | Admitting: Nephrology

## 2012-06-28 DIAGNOSIS — D509 Iron deficiency anemia, unspecified: Secondary | ICD-10-CM | POA: Insufficient documentation

## 2012-06-28 MED ORDER — SODIUM CHLORIDE 0.9 % IV SOLN
1020.0000 mg | Freq: Once | INTRAVENOUS | Status: AC
  Administered 2012-06-28: 1020 mg via INTRAVENOUS
  Filled 2012-06-28: qty 34

## 2012-06-28 MED ORDER — SODIUM CHLORIDE 0.9 % IV SOLN: Freq: Once | INTRAVENOUS | Status: DC

## 2012-07-26 ENCOUNTER — Ambulatory Visit (INDEPENDENT_AMBULATORY_CARE_PROVIDER_SITE_OTHER): Payer: PRIVATE HEALTH INSURANCE | Admitting: Medical

## 2012-07-26 ENCOUNTER — Encounter: Payer: Self-pay | Admitting: Medical

## 2012-07-26 VITALS — BP 100/70 | HR 78 | Temp 98.2°F | Resp 16 | Wt 110.0 lb

## 2012-07-26 DIAGNOSIS — N189 Chronic kidney disease, unspecified: Secondary | ICD-10-CM

## 2012-07-26 DIAGNOSIS — M349 Systemic sclerosis, unspecified: Secondary | ICD-10-CM

## 2012-07-26 DIAGNOSIS — J45909 Unspecified asthma, uncomplicated: Secondary | ICD-10-CM

## 2012-07-26 MED ORDER — BUDESONIDE-FORMOTEROL FUMARATE 160-4.5 MCG/ACT IN AERO: 2.0000 | INHALATION_SPRAY | Freq: Two times a day (BID) | RESPIRATORY_TRACT | Status: DC

## 2012-07-26 MED ORDER — PREDNISONE 10 MG PO TABS: ORAL_TABLET | ORAL | Status: DC

## 2012-07-26 NOTE — Progress Notes (Signed)
  Subjective:   HPI  [name] is a 56 y.o. female who presents for recheck on asthma.   I last saw her first of July for asthma flare.  Got some better, but not completley, having repeat flare up now, cough, wheezing.  Denies fever, congestion, chest pain, edema, palpitations, sore throat, but does have some runny nose.  Using Qvar BID, albuterol inhaler often.  Otherwise been in usual state of health.  No other aggravating or relieving factors.  Also got bit by tick on back of neck months ago, but still has place on neck.  No other c/o.  The following portions of the patient's history were reviewed and updated as appropriate: allergies, current medications, past family history, past medical history, past social history, past surgical history and problem list.  Past Medical History  Diagnosis Date  . Asthma   . Scleroderma   . Esophageal stricture   . Hypertension   . Allergic rhinitis   . GERD (gastroesophageal reflux disease)   . Renal insufficiency     Allergies  Allergen Reactions  . Cephalosporins   . Avelox (Moxifloxacin Hcl In Nacl)     Caused tachycardia  . Codeine   . Penicillins      Review of Systems ROS reviewed and was negative other than noted in HPI or above.    Objective:   Physical Exam  General appearance: alert, no distress, WD/WN HEENT: normocephalic, sclerae anicteric, TMs pearly, nares patent, no discharge or erythema, pharynx normal Oral cavity: MMM, no lesions Neck: supple, no lymphadenopathy, no thyromegaly, no masses Heart: RRR, normal S1, S2, no murmurs Lungs: scattered wheezes, but no rhonchi, or rales Pulses: 2+ symmetric   Assessment and Plan :     Encounter Diagnoses  Name Primary?  [name] Asthma Yes  . CRI (chronic renal insufficiency)   . Scleroderma    Asthma exacerbation - discussed asthma care, prevention, treatment for acute flare vs maintenance.  She has used Advair in the past, Singulair, Qvar.  begin sample and trial of  Symbicort.  Discussed proper usage.  Prednisone taper for flare.  Recheck 40mo.    CRI - requested labs and notes from [facility].  She sees them for BP control and surveillance.   Scleroderma - reviewed last rheumatology notes, requested labs from them.    Advised she return for physical and discussed importance of primary care involvement in her overall care and coordination of care.  She is past due for mammogram and some screening.   Return soon for physical.

## 2012-08-01 ENCOUNTER — Encounter: Payer: Self-pay | Admitting: Medical

## 2012-12-05 ENCOUNTER — Ambulatory Visit (INDEPENDENT_AMBULATORY_CARE_PROVIDER_SITE_OTHER): Payer: PRIVATE HEALTH INSURANCE | Admitting: Medical

## 2012-12-05 ENCOUNTER — Encounter: Payer: Self-pay | Admitting: Medical

## 2012-12-05 VITALS — BP 112/60 | HR 72 | Temp 98.1°F | Resp 16 | Wt 107.0 lb

## 2012-12-05 DIAGNOSIS — J329 Chronic sinusitis, unspecified: Secondary | ICD-10-CM

## 2012-12-05 DIAGNOSIS — R05 Cough: Secondary | ICD-10-CM

## 2012-12-05 DIAGNOSIS — H6590 Unspecified nonsuppurative otitis media, unspecified ear: Secondary | ICD-10-CM

## 2012-12-05 MED ORDER — DOXYCYCLINE HYCLATE 100 MG PO TABS: 100.0000 mg | ORAL_TABLET | Freq: Two times a day (BID) | ORAL | Status: DC

## 2012-12-05 NOTE — Progress Notes (Signed)
Subjective:  [name] is a 56 y.o. female who presents for several day hx/o sinus pressure, ears popping, coughing, hurts to take a deep breath, fever up to 101 over the weekend, some nausea, some wheezing.  She has hx/o sinus problems, asthma, and has sick contacts at home and work.  Using Flonase, sudafed and tylenol cold and sinus.  No other aggravating or relieving factors.  No other c/o.  Past Medical History  Diagnosis Date  . Asthma   . Scleroderma   . Esophageal stricture   . Hypertension   . Allergic rhinitis   . GERD (gastroesophageal reflux disease)   . Renal insufficiency    ROS in HPI   Objective:   Filed Vitals:   12/05/12 1618  BP: 112/60  Pulse: 72  Temp: 98.1 F (36.7 C)  Resp: 16    General appearance: Alert, WD/WN, no distress                             Skin: warm, no rash                           Head: + maxillary sinus tenderness,                            Eyes: conjunctiva normal, corneas clear, PERRLA                            Ears: serous fluid behind both TMs with mild erythema, external ear canals normal                          Nose: septum midline, turbinates swollen, with erythema and clear discharge             Mouth/throat: MMM, tongue normal, mild pharyngeal erythema                           Neck: supple, no adenopathy, no thyromegaly, nontender                          Heart: RRR, normal S1, S2, no murmurs                         Lungs: decreased breath sounds, no wheezes, rales, or rhonchi      Assessment and Plan:   Encounter Diagnoses  Name Primary?  . Serous otitis media Yes  . Sinusitis   . Cough    Prescription given for Doxycycline.  Discussed symptomatic relief, nasal saline, and call or return if worse or not improving in 2-3 days.

## 2012-12-26 ENCOUNTER — Other Ambulatory Visit: Payer: Self-pay | Admitting: Medical

## 2012-12-26 ENCOUNTER — Telehealth: Payer: Self-pay | Admitting: Internal Medicine

## 2012-12-26 NOTE — Telephone Encounter (Signed)
Spoke with pt She states needing the date that she last had PFT According to our records, last PFT done here was 01/04/09 Pt aware of this and states nothing further needed

## 2013-01-03 ENCOUNTER — Telehealth: Payer: Self-pay | Admitting: Family Medicine

## 2013-01-03 ENCOUNTER — Other Ambulatory Visit: Payer: Self-pay | Admitting: Medical

## 2013-01-03 MED ORDER — ALBUTEROL SULFATE HFA 108 (90 BASE) MCG/ACT IN AERS: 2.0000 | INHALATION_SPRAY | Freq: Four times a day (QID) | RESPIRATORY_TRACT | Status: DC | PRN

## 2013-01-03 NOTE — Telephone Encounter (Signed)
Pt wants generic of flonase

## 2013-01-03 NOTE — Telephone Encounter (Signed)
i sent proair, but I don't see flonase as active medication in chart.  Is she referring to Advair or flonase nasal spray for allergies?   She is also due for physical, so this needs to be set up.  Due for screening such as mammogram, etc.

## 2013-01-03 NOTE — Telephone Encounter (Signed)
90 day supply for Flonase and ProAir.

## 2013-01-04 ENCOUNTER — Other Ambulatory Visit: Payer: Self-pay | Admitting: Medical

## 2013-01-04 ENCOUNTER — Ambulatory Visit
Admission: RE | Admit: 2013-01-04 | Discharge: 2013-01-04 | Disposition: A | Discharge status: Home / Self Care | Payer: PRIVATE HEALTH INSURANCE | Source: Ambulatory Visit | Attending: Rheumatology | Admitting: Rheumatology

## 2013-01-04 ENCOUNTER — Other Ambulatory Visit: Payer: Self-pay | Admitting: Rheumatology

## 2013-01-04 DIAGNOSIS — M545 Low back pain: Secondary | ICD-10-CM

## 2013-01-04 MED ORDER — FLUTICASONE PROPIONATE 50 MCG/ACT NA SUSP: 2.0000 | Freq: Every day | NASAL | Status: DC

## 2013-01-04 NOTE — Telephone Encounter (Signed)
Patient states that she is referring to Flonase nasal spray and she would like the generic. She said she would have to call back to schedule her physical. CLS

## 2013-01-12 ENCOUNTER — Telehealth: Payer: Self-pay | Admitting: Internal Medicine

## 2013-01-12 NOTE — Telephone Encounter (Signed)
Faxed over medical records to disability [employer] services  

## 2013-02-07 ENCOUNTER — Telehealth: Payer: Self-pay | Admitting: Medical

## 2013-02-07 MED ORDER — BUDESONIDE-FORMOTEROL FUMARATE 160-4.5 MCG/ACT IN AERO: 2.0000 | INHALATION_SPRAY | Freq: Two times a day (BID) | RESPIRATORY_TRACT | Status: DC

## 2013-02-07 NOTE — Telephone Encounter (Signed)
Patient is aware that her medication sample is ready for pick up. CLS

## 2013-02-08 ENCOUNTER — Telehealth: Payer: Self-pay | Admitting: Medical

## 2013-02-13 NOTE — Telephone Encounter (Signed)
DONE FAXED

## 2013-02-17 DIAGNOSIS — Z0271 Encounter for disability determination: Secondary | ICD-10-CM

## 2013-03-20 ENCOUNTER — Telehealth: Payer: Self-pay | Admitting: Medical

## 2013-03-20 NOTE — Telephone Encounter (Signed)
[name] is it okay to give this patients samples of Advair . Her last Asthma visit was July, 2013

## 2013-03-20 NOTE — Telephone Encounter (Signed)
Can give a sample, but she is really due for mammogram, routine screenings.   pls set this up/CPX.

## 2013-03-20 NOTE — Telephone Encounter (Signed)
See msg

## 2013-03-21 ENCOUNTER — Other Ambulatory Visit: Payer: Self-pay | Admitting: Family Medicine

## 2013-03-21 MED ORDER — BUDESONIDE-FORMOTEROL FUMARATE 160-4.5 MCG/ACT IN AERO: 2.0000 | INHALATION_SPRAY | Freq: Two times a day (BID) | RESPIRATORY_TRACT | Status: DC

## 2013-03-21 NOTE — Telephone Encounter (Signed)
I called and I spoke with the patient I told her that I would leave her samples up front but she is due for CPX from our records. Patient decline to schedule the appointment. Patient states that she doesn't have insurance at the present time. CLS

## 2013-08-17 ENCOUNTER — Encounter: Payer: Self-pay | Admitting: Medical

## 2013-11-08 ENCOUNTER — Ambulatory Visit (INDEPENDENT_AMBULATORY_CARE_PROVIDER_SITE_OTHER): Payer: Self-pay | Admitting: Medical

## 2013-11-08 ENCOUNTER — Telehealth: Payer: Self-pay | Admitting: Medical

## 2013-11-08 ENCOUNTER — Encounter: Payer: Self-pay | Admitting: Medical

## 2013-11-08 VITALS — BP 100/60 | HR 60 | Temp 98.2°F | Resp 16 | Wt 111.0 lb

## 2013-11-08 DIAGNOSIS — J45901 Unspecified asthma with (acute) exacerbation: Secondary | ICD-10-CM

## 2013-11-08 DIAGNOSIS — H6690 Otitis media, unspecified, unspecified ear: Secondary | ICD-10-CM

## 2013-11-08 MED ORDER — AZITHROMYCIN 250 MG PO TABS: ORAL_TABLET | ORAL | Status: DC

## 2013-11-08 MED ORDER — METHYLPREDNISOLONE (PAK) 4 MG PO TABS: ORAL_TABLET | ORAL | Status: DC

## 2013-11-08 NOTE — Progress Notes (Signed)
Subjective:   [name] is a 57 y.o. female who presents with illness.  Started with pressure in ears 3 wk ago, but now her asthma is acting up.  Still has ear pain bilat, wheezing, cough, SOB, fatigue.   Denies fever, NVD, no sick contacts.  Using OTC nasalcort, sudafed BID.   No other aggravating or relieving factors.  No other c/o.  ROS as in subjective  Objective:  Filed Vitals:   11/08/13 1026  BP: 100/60  Pulse: 60  Temp: 98.2 F (36.8 C)  Resp: 16    General appearance: Alert, WD/WN, no distress                             Skin: warm, no rash                           Head: mild frontal sinus tenderness                            Eyes: conjunctiva normal, corneas clear, PERRLA                            Ears: bilat serous effusions, external ear canals normal                          Nose: septum midline, turbinates swollen, with erythema but no discharge             Mouth/throat: MMM, tongue normal, mild pharyngeal erythema                           Neck: supple, no adenopathy, no thyromegaly, nontender                          Heart: RRR, normal S1, S2, no murmurs                         Lungs: few expiratory wheezes, no rales, or rhonchi     Assessment: Encounter Diagnoses  Name Primary?  [name] Asthma with acute exacerbation Yes  . Otitis media, bilateral    Plan: Prescription for Medrol and Azithromycin given as below.  C/t Symbicort, c/t Albuterol prn, call/return in 2-3 days if symptoms aren't resolving.

## 2013-11-08 NOTE — Telephone Encounter (Signed)
Please call pt this morning and verify pharmacy.  i sent medication to [facility] but I believe she wanted it to a different location.      I leave it up to nurse to verify and change pharmacy if needed.  When I try to pull up pharmacy such as [facility], I get a 100 options, and not sure of a quicker way to find the right pharmacy.

## 2013-11-09 ENCOUNTER — Telehealth: Payer: Self-pay | Admitting: Family Medicine

## 2013-11-09 NOTE — Telephone Encounter (Signed)
I fax everything over to [employer] per Onalee Hua tysinger PA-c. CLS

## 2013-11-09 NOTE — Telephone Encounter (Signed)
Message copied by Janeice Robinson on Thu Nov 09, 2013  8:02 AM ------      Message from: Niles, DAVID S      Created: Wed Nov 08, 2013  8:41 PM      Regarding: Refer to [facility]       pls pass along her info to [employer] for Symbicort study.  Not sure if she qualifies. ------

## 2013-11-09 NOTE — Telephone Encounter (Signed)
No [facility] that is the pharmacy she wanted her medications sent too. She told me. CLS

## 2014-01-25 ENCOUNTER — Ambulatory Visit (INDEPENDENT_AMBULATORY_CARE_PROVIDER_SITE_OTHER): Payer: BC Managed Care – PPO | Admitting: Family Medicine

## 2014-01-25 ENCOUNTER — Encounter: Payer: Self-pay | Admitting: Family Medicine

## 2014-01-25 VITALS — BP 118/66 | HR 68 | Wt 111.0 lb

## 2014-01-25 DIAGNOSIS — M3489 Other systemic sclerosis: Secondary | ICD-10-CM

## 2014-01-25 DIAGNOSIS — M349 Systemic sclerosis, unspecified: Secondary | ICD-10-CM

## 2014-01-25 DIAGNOSIS — J329 Chronic sinusitis, unspecified: Secondary | ICD-10-CM

## 2014-01-25 DIAGNOSIS — N08 Glomerular disorders in diseases classified elsewhere: Secondary | ICD-10-CM

## 2014-01-25 DIAGNOSIS — J45901 Unspecified asthma with (acute) exacerbation: Secondary | ICD-10-CM

## 2014-01-25 DIAGNOSIS — M341 CR(E)ST syndrome: Secondary | ICD-10-CM

## 2014-01-25 DIAGNOSIS — N058 Unspecified nephritic syndrome with other morphologic changes: Secondary | ICD-10-CM

## 2014-01-25 MED ORDER — CLARITHROMYCIN 500 MG PO TABS: 500.0000 mg | ORAL_TABLET | Freq: Two times a day (BID) | ORAL | Status: DC

## 2014-01-25 NOTE — Progress Notes (Signed)
   Subjective:    Patient ID: Caitlyn MiloSandra M Alpern, female    DOB: [birth year], 57 y.o.   MRN: [id number]  HPI She has a ten-day history of a cough with clear sputum, sore throat, ear and facial pressure with nasal congestion, headache, chest tightness with PND. The cough became productive 3 days ago. She does have underlying asthma and has been using her albuterol twice per day . She takes her Symbicort regularly. Does not smoke. She sees her rheumatologist regularly for her scleroderma. She also is followed by pulmonary for her asthma and has had GI evaluation for esophageal stricture which was most likely related to her scleroderma she has been doing well but these issues.   Review of Systems     Objective:   Physical Exam alert and in no distress. Tympanic membranes and canals are normal. Throat is clear. Tonsils are normal. Neck is supple without adenopathy or thyromegaly. Cardiac exam shows a regular sinus rhythm without murmurs or gallops. Lungs show expiratory wheezing. Nasal mucosa is slightly red with pressure over maxillary and frontal sinuses  The last note from rheumatology was reviewed.      Assessment & Plan:  Asthma exacerbation  Sinusitis - Plan: clarithromycin (BIAXIN) 500 MG tablet  Renal involvement in scleroderma  Scleroderma of esophagus  there is a questionable history of true allergy to penicillin however I do not feel the need to pursue her a more accurate diagnosis.

## 2014-01-25 NOTE — Patient Instructions (Signed)
Take all the antibiotic and if not totally back to normal when you finish ,call me. 

## 2014-02-21 ENCOUNTER — Telehealth: Payer: Self-pay | Admitting: Internal Medicine

## 2014-02-21 NOTE — Telephone Encounter (Signed)
Is she not enrolling in the Symbicort [employer] study?  I know they have talked to her.  My understanding is that she qualifies and this would give her some income and free Symbicort!!!

## 2014-02-21 NOTE — Telephone Encounter (Signed)
Patient states that yes she was going to do the [facility] but she got sick and she was on an antibiotic and they would no tdo the study with her on the antibiotic. She said she would start back after the weather breaks. CLS

## 2014-02-21 NOTE — Telephone Encounter (Signed)
PT ASSISTANCE FORM GIVEN TO PT

## 2014-02-21 NOTE — Telephone Encounter (Signed)
My understanding from [employer] is that she can participate, so have her f/u with them.  For now, give a symbicort sample if we have them

## 2014-02-21 NOTE — Telephone Encounter (Signed)
Pt is wanting to get that form again for pt assistant for free symbicort for a year. Pt states that we gave her the form last year to get filled out and fax in. i am not really sure what she is talking about. Do we have those forms here?

## 2014-02-22 ENCOUNTER — Telehealth: Payer: Self-pay | Admitting: Internal Medicine

## 2014-02-22 NOTE — Telephone Encounter (Signed)
Gave a sample of symbicort

## 2014-02-22 NOTE — Telephone Encounter (Signed)
Faxed over medical records on January 20th @ [phone]

## 2014-02-23 ENCOUNTER — Other Ambulatory Visit: Payer: Self-pay | Admitting: Family Medicine

## 2014-02-23 NOTE — Telephone Encounter (Signed)
Patient is aware. CLS 

## 2014-03-05 ENCOUNTER — Telehealth: Payer: Self-pay | Admitting: Medical

## 2014-03-05 NOTE — Telephone Encounter (Signed)
[name], form in folder for your completion

## 2014-03-06 NOTE — Telephone Encounter (Signed)
done

## 2014-03-06 NOTE — Telephone Encounter (Signed)
Faxed completed Pt assistance forms to [employer].  Also asked pt if she was going to do the [employer] study and she said yes she planned on it just not sure when

## 2014-03-20 ENCOUNTER — Ambulatory Visit (INDEPENDENT_AMBULATORY_CARE_PROVIDER_SITE_OTHER): Payer: BC Managed Care – PPO | Admitting: Medical

## 2014-03-20 ENCOUNTER — Encounter: Payer: Self-pay | Admitting: Medical

## 2014-03-20 VITALS — BP 98/58 | HR 72 | Temp 98.1°F | Resp 14 | Wt 113.0 lb

## 2014-03-20 DIAGNOSIS — H6590 Unspecified nonsuppurative otitis media, unspecified ear: Secondary | ICD-10-CM

## 2014-03-20 DIAGNOSIS — J45901 Unspecified asthma with (acute) exacerbation: Secondary | ICD-10-CM

## 2014-03-20 DIAGNOSIS — R062 Wheezing: Secondary | ICD-10-CM

## 2014-03-20 MED ORDER — AZITHROMYCIN 250 MG PO TABS
ORAL_TABLET | ORAL | Status: DC
Start: 2014-03-20 — End: 2015-03-20

## 2014-03-20 MED ORDER — METHYLPREDNISOLONE (PAK) 4 MG PO TABS: ORAL_TABLET | ORAL | Status: DC

## 2014-03-20 MED ORDER — ALBUTEROL SULFATE (2.5 MG/3ML) 0.083% IN NEBU: 2.5000 mg | INHALATION_SOLUTION | Freq: Four times a day (QID) | RESPIRATORY_TRACT | Status: DC | PRN

## 2014-03-20 NOTE — Addendum Note (Signed)
Addended by: Leretha DykesSCALES, Bryden Darden L on: 03/20/2014 10:14 AM   Modules accepted: Orders

## 2014-03-20 NOTE — Progress Notes (Signed)
   Subjective:    Patient ID: [name], female    DOB: 04/25/1956, 58 y.o.   MRN: [id number]  HPI She has a 2 week history of a cough with green sputum, wheezing, asthma flaring up, ear and facial pressure with nasal congestion, headache, chest tightness.  No fever ,no NVD.  Using sudafed.   No sick contacts. Doesn't have any nebulized albuterol solution at home.  Is using her normal Symbicort, using albuterol 3 times daily.  Review of Systems     Objective:   Filed Vitals:   03/20/14 0934  BP: 98/58  Pulse: 72  Temp: 98.1 F (36.7 C)  Resp: 14   Pulse ox 98% room air  General appearance: alert, no distress, WD/WN HEENT: normocephalic, sclerae anicteric, serous effusions behind both TMs, right TM with erythema, nares patent, no discharge or erythema, pharynx normal Oral cavity: MMM, no lesions Neck: supple, no lymphadenopathy, no thyromegaly, no masses Heart: RRR, normal S1, S2, no murmurs Lungs: diffuse upper wheezes, no rhonchi, or rales Pulses: 2+ symmetric, upper and lower extremities, normal cap refill Ext: no edema        Assessment & Plan:   Encounter Diagnoses  Name Primary?  [name]. Asthma with acute exacerbation Yes  . Serous otitis media   . Wheezing    Begin Zpak, rest, increase water intake, c/t Symbicort, can use Albuterol HFA or nebulizer q4-6 hours, begin Medrol dosepak.  If not improving in 2-3 days or worse, then return.

## 2015-03-20 ENCOUNTER — Encounter: Payer: Self-pay | Admitting: Family Medicine

## 2015-03-20 ENCOUNTER — Ambulatory Visit (INDEPENDENT_AMBULATORY_CARE_PROVIDER_SITE_OTHER): Payer: BLUE CROSS/BLUE SHIELD | Admitting: Family Medicine

## 2015-03-20 VITALS — BP 104/60 | Temp 98.0°F | Wt 116.0 lb

## 2015-03-20 DIAGNOSIS — J45909 Unspecified asthma, uncomplicated: Secondary | ICD-10-CM | POA: Diagnosis not present

## 2015-03-20 DIAGNOSIS — N259 Disorder resulting from impaired renal tubular function, unspecified: Secondary | ICD-10-CM

## 2015-03-20 DIAGNOSIS — J0110 Acute frontal sinusitis, unspecified: Secondary | ICD-10-CM | POA: Diagnosis not present

## 2015-03-20 DIAGNOSIS — J4520 Mild intermittent asthma, uncomplicated: Secondary | ICD-10-CM | POA: Diagnosis not present

## 2015-03-20 MED ORDER — CLARITHROMYCIN 500 MG PO TABS: 500.0000 mg | ORAL_TABLET | Freq: Two times a day (BID) | ORAL | Status: DC

## 2015-03-20 NOTE — Patient Instructions (Addendum)
Try Tylenol for the chest pain

## 2015-03-20 NOTE — Progress Notes (Signed)
   Subjective:    Patient ID: [name] Born, female    DOB: 07-21-1956, 59 y.o.   MRN: [id number]  HPI She complains of a one-week history started with chest congestion followed by wheezing, sinus pain, pressure, rhinorrhea and earache. No sore throat, fever, chills. She does have underlying allergies and asthma. She does use Sudafed and Nasacort. She does not smoke. She does have an underlying history of renal dysfunction.   Review of Systems     Objective:   Physical Exam Alert and in no distress. Tympanic membranes and canals are normal. Pharyngeal area is normal. Neck is supple without adenopathy or thyromegaly. Cardiac exam shows a regular sinus rhythm without murmurs or gallops. Lungs are clear to auscultation.nasal mucosa is slightly red with tenderness over frontal sinuses.        Assessment & Plan:  Asthmatic bronchitis, mild intermittent, uncomplicated - Plan: clarithromycin (BIAXIN) 500 MG tablet  Acute frontal sinusitis, recurrence not specified - Plan: clarithromycin (BIAXIN) 500 MG tablet  Disorder resulting from impaired renal function  Intrinsic asthma, unspecified asthma severity, uncomplicated continue on her present asthma medications. Biaxin will be given. She is to call if not entirely better when she finishes the antibiotic.

## 2015-04-09 ENCOUNTER — Other Ambulatory Visit: Payer: Self-pay | Admitting: Family Medicine

## 2015-04-09 ENCOUNTER — Telehealth: Payer: Self-pay | Admitting: Family Medicine

## 2015-04-09 MED ORDER — BUDESONIDE-FORMOTEROL FUMARATE 160-4.5 MCG/ACT IN AERO: 2.0000 | INHALATION_SPRAY | Freq: Two times a day (BID) | RESPIRATORY_TRACT | Status: DC

## 2015-04-09 NOTE — Telephone Encounter (Signed)
Rx ordered and fax

## 2015-04-09 NOTE — Telephone Encounter (Signed)
Please fax Symbicort 160/4.5 rx to [employer] at [phone] and pt can get it for free.

## 2015-04-09 NOTE — Telephone Encounter (Signed)
pls send 1 refill or 90 days whatever they requires, but set up physical appt. I havne't seen her in a year, and she recently came in for acute visit only.

## 2015-04-11 ENCOUNTER — Telehealth: Payer: Self-pay

## 2015-04-11 NOTE — Telephone Encounter (Signed)
Called patient and advised her she needed to make an appointment for a physical with Kristian CoveyShane Tysinger PA, pt declined and said she would call back to schedule.

## 2015-05-29 ENCOUNTER — Encounter: Payer: Self-pay | Admitting: Medical

## 2015-05-29 ENCOUNTER — Ambulatory Visit (INDEPENDENT_AMBULATORY_CARE_PROVIDER_SITE_OTHER): Payer: Commercial Managed Care - HMO | Admitting: Medical

## 2015-05-29 VITALS — BP 92/60 | HR 66 | Resp 15 | Wt 117.0 lb

## 2015-05-29 DIAGNOSIS — L089 Local infection of the skin and subcutaneous tissue, unspecified: Secondary | ICD-10-CM | POA: Diagnosis not present

## 2015-05-29 DIAGNOSIS — J4541 Moderate persistent asthma with (acute) exacerbation: Secondary | ICD-10-CM | POA: Diagnosis not present

## 2015-05-29 DIAGNOSIS — M349 Systemic sclerosis, unspecified: Secondary | ICD-10-CM

## 2015-05-29 MED ORDER — BUDESONIDE-FORMOTEROL FUMARATE 160-4.5 MCG/ACT IN AERO
2.0000 | INHALATION_SPRAY | Freq: Two times a day (BID) | RESPIRATORY_TRACT | Status: DC
Start: 2015-05-29 — End: 2015-05-29

## 2015-05-29 MED ORDER — DOXYCYCLINE HYCLATE 100 MG PO TABS: 100.0000 mg | ORAL_TABLET | Freq: Two times a day (BID) | ORAL | Status: DC

## 2015-05-29 MED ORDER — ALBUTEROL SULFATE HFA 108 (90 BASE) MCG/ACT IN AERS: 2.0000 | INHALATION_SPRAY | Freq: Four times a day (QID) | RESPIRATORY_TRACT | Status: DC | PRN

## 2015-05-29 MED ORDER — BUDESONIDE-FORMOTEROL FUMARATE 160-4.5 MCG/ACT IN AERO: 2.0000 | INHALATION_SPRAY | Freq: Two times a day (BID) | RESPIRATORY_TRACT | Status: DC

## 2015-05-29 NOTE — Progress Notes (Signed)
   Subjective:    Patient ID: [name], female    DOB: Oct 20, 1956, 59 y.o.   MRN: [id number]  Subjective: Here for infection in left thumb and asthma and sinus flare.    Ran out of Symbicort recently as she hasn't been in for routine f/u with me in a over a year .  Has been using albuterol inhaler few times daily.   Needs letter signed to get medication assistance with Symbicort as it works well for her asthma control. current symptoms include 1.5 wk hx/o sinus pressure, head and chest congestion, wheezing, has felt feverish, cough, somewhat productive cough.  Some upset stomach.   Denies vomiting, diarrhea.  Has hx/o scleroderma and gets cracking of fingertips.  ocassionally finger tip gets infected and currently has been dealing with infected left thumb tip, has some skin color changes and redness.  No other aggravating or relieving factors. No other complaint.   Review of Systems     Objective:   BP 92/60 mmHg  Pulse 66  Resp 15  Wt 117 lb (53.071 kg)  General appearance: alert, no distress, WD/WN HEENT: normocephalic, sclerae anicteric, serous effusions behind both TMs, right TM with erythema, nares patent, no discharge or erythema, pharynx normal Oral cavity: MMM, no lesions Neck: supple, no lymphadenopathy, no thyromegaly, no masses Heart: RRR, normal S1, S2, no murmurs Lungs:scattered upper wheezes, no rhonchi, or rales Pulses: 2+ symmetric, upper and lower extremities, normal cap refill Ext: no edema Left thumb with slight erythema and crusting of distal fingertip, there is healing wounds from other similar cracking wounds of other fingertips        Assessment & Plan:   Encounter Diagnoses  Name Primary?  [name]. Asthmatic bronchitis, moderate persistent, with acute exacerbation Yes  . Finger infection   . Scleroderma    Begin Doxycyline rest, increase water intake, restart Symbicort, can use Albuterol HFA or nebulizer q4-6 hours.  If not improving in 2-3 days or worse,  then return.   Signed medication assistance form.  Return soon for physical as discussed.

## 2015-05-29 NOTE — Addendum Note (Signed)
Addended by: Jac CanavanYSINGER, Lilianah Buffin S on: 05/29/2015 01:22 PM   Modules accepted: Orders

## 2015-05-30 ENCOUNTER — Telehealth: Payer: Self-pay | Admitting: Family Medicine

## 2015-05-30 NOTE — Telephone Encounter (Signed)
Call in prednisone 20mg , 2 tablets daily x 5 days, #10 , no refill

## 2015-05-30 NOTE — Telephone Encounter (Signed)
Pt. Called in stating she seen [facility] yesterday for an appt.and they had disscussed possibly starting a prescription for prednisone. Pt. States she would like to start the medication if a prescription could be called in for her or does she need to come back in for another appt????  Pharmacy [facility] [address]  Phone # [phone]

## 2015-05-30 NOTE — Telephone Encounter (Signed)
Vincenza HewsShane, did you get this message? See msg. below

## 2015-05-31 ENCOUNTER — Other Ambulatory Visit: Payer: Self-pay | Admitting: Family Medicine

## 2015-05-31 MED ORDER — PREDNISONE 20 MG PO TABS: 20.0000 mg | ORAL_TABLET | Freq: Two times a day (BID) | ORAL | Status: DC

## 2015-05-31 NOTE — Telephone Encounter (Signed)
I called out Prednisone to her pharmacy per Crosby Oysteravid Tysinger PA

## 2015-06-04 ENCOUNTER — Telehealth: Payer: Self-pay | Admitting: Medical

## 2015-06-07 NOTE — Telephone Encounter (Signed)
[name] HFA approved til 12/27/38, faxed pharmacy, Pt informed

## 2015-07-31 ENCOUNTER — Telehealth: Payer: Self-pay | Admitting: Family Medicine

## 2015-07-31 NOTE — Telephone Encounter (Signed)
Pt called and stated her ins is requiring a referral to her rheumatologist, Dr. Stacey Drain. She has an appt next week. Pt stated referral number is [phone] and she can be reached at [phone].

## 2015-08-01 NOTE — Telephone Encounter (Signed)
[name] this is yalls pt

## 2015-08-01 NOTE — Telephone Encounter (Signed)
I fax over the referral to [facility] for the patient to see Dr. Kellie Simmering

## 2015-08-06 DIAGNOSIS — L98499 Non-pressure chronic ulcer of skin of other sites with unspecified severity: Secondary | ICD-10-CM | POA: Diagnosis not present

## 2015-08-06 DIAGNOSIS — N189 Chronic kidney disease, unspecified: Secondary | ICD-10-CM | POA: Diagnosis not present

## 2015-08-06 DIAGNOSIS — L299 Pruritus, unspecified: Secondary | ICD-10-CM | POA: Diagnosis not present

## 2015-08-06 DIAGNOSIS — M349 Systemic sclerosis, unspecified: Secondary | ICD-10-CM | POA: Diagnosis not present

## 2015-09-12 ENCOUNTER — Telehealth: Payer: Self-pay | Admitting: Family Medicine

## 2015-09-12 MED ORDER — BUDESONIDE-FORMOTEROL FUMARATE 160-4.5 MCG/ACT IN AERO: 2.0000 | INHALATION_SPRAY | Freq: Two times a day (BID) | RESPIRATORY_TRACT | Status: DC

## 2015-09-12 NOTE — Telephone Encounter (Signed)
Pt has Pt Assistance for Symbicort 160/4.5 She is out and request samples to hold her til gets new application approved

## 2015-09-12 NOTE — Telephone Encounter (Signed)
Pt was notified to come pick up sample

## 2015-10-16 ENCOUNTER — Telehealth: Payer: Self-pay

## 2015-10-16 NOTE — Telephone Encounter (Signed)
Applied for an application to help pay for her Symbicort and it is taking longer than she expected for it to go through. She was wondering if she could get samples until she can get her prescription.

## 2015-10-16 NOTE — Telephone Encounter (Signed)
pls give sample if we have it

## 2015-10-17 NOTE — Telephone Encounter (Signed)
Let pt know we had it and she will be here tomorrow to pick it up

## 2015-10-25 ENCOUNTER — Telehealth: Payer: Self-pay | Admitting: Medical

## 2015-10-25 NOTE — Telephone Encounter (Signed)
PATIENT ASSISTANCE SYMBICORT form was completed & faxed

## 2015-11-29 ENCOUNTER — Telehealth: Payer: Self-pay | Admitting: Internal Medicine

## 2015-11-29 NOTE — Telephone Encounter (Signed)
Pt called and states that she needs a referral for her appt on dec 15th at [facility] with Dr. Allena KatzPatel for kidney failure. 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

## 2015-12-04 DIAGNOSIS — E889 Metabolic disorder, unspecified: Secondary | ICD-10-CM | POA: Diagnosis not present

## 2015-12-04 DIAGNOSIS — N183 Chronic kidney disease, stage 3 (moderate): Secondary | ICD-10-CM | POA: Diagnosis not present

## 2015-12-04 DIAGNOSIS — N189 Chronic kidney disease, unspecified: Secondary | ICD-10-CM | POA: Diagnosis not present

## 2015-12-12 DIAGNOSIS — D631 Anemia in chronic kidney disease: Secondary | ICD-10-CM | POA: Diagnosis not present

## 2015-12-12 DIAGNOSIS — E889 Metabolic disorder, unspecified: Secondary | ICD-10-CM | POA: Diagnosis not present

## 2015-12-12 DIAGNOSIS — N183 Chronic kidney disease, stage 3 (moderate): Secondary | ICD-10-CM | POA: Diagnosis not present

## 2015-12-12 DIAGNOSIS — M9080 Osteopathy in diseases classified elsewhere, unspecified site: Secondary | ICD-10-CM | POA: Diagnosis not present

## 2015-12-12 DIAGNOSIS — I129 Hypertensive chronic kidney disease with stage 1 through stage 4 chronic kidney disease, or unspecified chronic kidney disease: Secondary | ICD-10-CM | POA: Diagnosis not present

## 2015-12-29 DIAGNOSIS — Z8719 Personal history of other diseases of the digestive system: Secondary | ICD-10-CM

## 2015-12-29 HISTORY — DX: Personal history of other diseases of the digestive system: Z87.19

## 2016-01-13 ENCOUNTER — Telehealth: Payer: Self-pay | Admitting: Medical

## 2016-01-13 NOTE — Telephone Encounter (Signed)
Pt dropped off note stating can't process Pt Assistance application due to no product selected.  I sent fax back with copy of application sheet showing product Symbicort was selected.  I tried calling but they are closed due to holiday

## 2016-01-15 ENCOUNTER — Encounter: Payer: Self-pay | Admitting: Medical

## 2016-01-15 DIAGNOSIS — N189 Chronic kidney disease, unspecified: Secondary | ICD-10-CM | POA: Diagnosis not present

## 2016-01-15 DIAGNOSIS — I7300 Raynaud's syndrome without gangrene: Secondary | ICD-10-CM | POA: Diagnosis not present

## 2016-01-15 DIAGNOSIS — L98499 Non-pressure chronic ulcer of skin of other sites with unspecified severity: Secondary | ICD-10-CM | POA: Diagnosis not present

## 2016-01-15 DIAGNOSIS — L299 Pruritus, unspecified: Secondary | ICD-10-CM | POA: Diagnosis not present

## 2016-01-15 DIAGNOSIS — M349 Systemic sclerosis, unspecified: Secondary | ICD-10-CM | POA: Diagnosis not present

## 2016-01-27 NOTE — Telephone Encounter (Signed)
Called pt & she hadn't heard anything from [employer]. I called [place] & they said new requirements for 2017 is that pt must submit a new application after patient pays out of pocket 3 % of their yearly income BEFORE application can be submitted.  I called pt & informed her,  She states she checked last week and Symbicort thru her ins will cost her $2300 a month & she can't pay that.  She will keep up with her receipts and let me know when she gets close to the $372 out of pocket.  I advised her we would give her samples when we have them.

## 2016-03-10 ENCOUNTER — Telehealth: Payer: Self-pay | Admitting: Family Medicine

## 2016-03-10 NOTE — Telephone Encounter (Signed)
Pt called needs Symbicort sample as 3 mo supply with her insurance is $7,000.00.  2 samples given

## 2016-05-19 ENCOUNTER — Telehealth: Payer: Self-pay | Admitting: Medical

## 2016-05-19 MED ORDER — BUDESONIDE-FORMOTEROL FUMARATE 160-4.5 MCG/ACT IN AERO: 2.0000 | INHALATION_SPRAY | Freq: Two times a day (BID) | RESPIRATORY_TRACT | Status: DC

## 2016-05-19 NOTE — Telephone Encounter (Signed)
Pt called and asked for samples Symbicort, #2 given

## 2016-06-01 ENCOUNTER — Telehealth: Payer: Self-pay

## 2016-06-01 NOTE — Telephone Encounter (Signed)
Pt states she has [employer] and needs referral number for Dr. Kellie Simmeringruslow- last one expired in 01/2016. I can not process this as I do not have a log in yet.   Thanks, Lurena Joinerebecca

## 2016-06-01 NOTE — Telephone Encounter (Signed)
Pt has medicare and already is a patient at [employer] [facility]. Not sure what is needing to be done since her insurance doesn't require any authorizations?

## 2016-06-01 NOTE — Telephone Encounter (Signed)
Authorization done and will call pt

## 2016-06-01 NOTE — Telephone Encounter (Signed)
Patient called requesting authorization for Rheumatology for Caitlyn DrainWilliam Truslow, MD. She would like to see if this can be done. Has appt next Wednesday.  Trixie Rude/RLB

## 2016-06-02 NOTE — Telephone Encounter (Signed)
Pt aware.

## 2016-06-10 DIAGNOSIS — M349 Systemic sclerosis, unspecified: Secondary | ICD-10-CM | POA: Diagnosis not present

## 2016-06-10 DIAGNOSIS — N189 Chronic kidney disease, unspecified: Secondary | ICD-10-CM | POA: Diagnosis not present

## 2016-06-10 DIAGNOSIS — I7300 Raynaud's syndrome without gangrene: Secondary | ICD-10-CM | POA: Diagnosis not present

## 2016-06-10 DIAGNOSIS — L03011 Cellulitis of right finger: Secondary | ICD-10-CM | POA: Diagnosis not present

## 2016-06-23 ENCOUNTER — Observation Stay (HOSPITAL_COMMUNITY)
Admission: EM | Admit: 2016-06-23 | Discharge: 2016-06-25 | Disposition: A | Discharge status: Home / Self Care | Payer: Commercial Managed Care - HMO | Attending: Internal Medicine | Admitting: Internal Medicine

## 2016-06-23 ENCOUNTER — Encounter (HOSPITAL_COMMUNITY): Payer: Self-pay

## 2016-06-23 DIAGNOSIS — Z79899 Other long term (current) drug therapy: Secondary | ICD-10-CM | POA: Insufficient documentation

## 2016-06-23 DIAGNOSIS — K449 Diaphragmatic hernia without obstruction or gangrene: Secondary | ICD-10-CM | POA: Diagnosis not present

## 2016-06-23 DIAGNOSIS — K529 Noninfective gastroenteritis and colitis, unspecified: Principal | ICD-10-CM | POA: Insufficient documentation

## 2016-06-23 DIAGNOSIS — M349 Systemic sclerosis, unspecified: Secondary | ICD-10-CM | POA: Diagnosis not present

## 2016-06-23 DIAGNOSIS — N200 Calculus of kidney: Secondary | ICD-10-CM | POA: Insufficient documentation

## 2016-06-23 DIAGNOSIS — N390 Urinary tract infection, site not specified: Secondary | ICD-10-CM | POA: Diagnosis not present

## 2016-06-23 DIAGNOSIS — K219 Gastro-esophageal reflux disease without esophagitis: Secondary | ICD-10-CM | POA: Diagnosis not present

## 2016-06-23 DIAGNOSIS — R1111 Vomiting without nausea: Secondary | ICD-10-CM | POA: Diagnosis not present

## 2016-06-23 DIAGNOSIS — I129 Hypertensive chronic kidney disease with stage 1 through stage 4 chronic kidney disease, or unspecified chronic kidney disease: Secondary | ICD-10-CM | POA: Insufficient documentation

## 2016-06-23 DIAGNOSIS — E860 Dehydration: Secondary | ICD-10-CM | POA: Insufficient documentation

## 2016-06-23 DIAGNOSIS — J45909 Unspecified asthma, uncomplicated: Secondary | ICD-10-CM | POA: Diagnosis not present

## 2016-06-23 DIAGNOSIS — N179 Acute kidney failure, unspecified: Secondary | ICD-10-CM | POA: Diagnosis not present

## 2016-06-23 DIAGNOSIS — N184 Chronic kidney disease, stage 4 (severe): Secondary | ICD-10-CM | POA: Diagnosis not present

## 2016-06-23 DIAGNOSIS — K2970 Gastritis, unspecified, without bleeding: Secondary | ICD-10-CM | POA: Diagnosis not present

## 2016-06-23 DIAGNOSIS — I10 Essential (primary) hypertension: Secondary | ICD-10-CM | POA: Diagnosis present

## 2016-06-23 MED ORDER — SODIUM CHLORIDE 0.9 % IV BOLUS (SEPSIS)
1000.0000 mL | Freq: Once | INTRAVENOUS | Status: AC
  Administered 2016-06-23: 1000 mL via INTRAVENOUS

## 2016-06-23 MED ORDER — ONDANSETRON HCL 4 MG/2ML IJ SOLN
4.0000 mg | Freq: Once | INTRAMUSCULAR | Status: AC
  Administered 2016-06-23: 4 mg via INTRAVENOUS
  Filled 2016-06-23: qty 2

## 2016-06-23 MED ORDER — SODIUM CHLORIDE 0.9 % IV SOLN: INTRAVENOUS | Status: DC

## 2016-06-23 MED ORDER — FENTANYL CITRATE (PF) 100 MCG/2ML IJ SOLN
50.0000 ug | INTRAMUSCULAR | Status: DC | PRN
  Administered 2016-06-24: 50 ug via INTRAVENOUS
  Filled 2016-06-23: qty 2

## 2016-06-23 MED ORDER — FENTANYL CITRATE (PF) 100 MCG/2ML IJ SOLN
50.0000 ug | Freq: Once | INTRAMUSCULAR | Status: AC
  Administered 2016-06-23: 50 ug via INTRAVENOUS
  Filled 2016-06-23: qty 2

## 2016-06-23 MED ORDER — ONDANSETRON 4 MG PO TBDP
4.0000 mg | ORAL_TABLET | Freq: Once | ORAL | Status: AC | PRN
  Administered 2016-06-23: 4 mg via ORAL
  Filled 2016-06-23: qty 1

## 2016-06-23 NOTE — ED Provider Notes (Signed)
By signing my name below, I, Vista Minkobert Ross, attest that this documentation has been prepared under the direction and in the presence of Erasmo Vertz N Vadim Centola, DO. Electronically signed, Vista Minkobert Ross, ED Scribe. 06/24/2016. 3:44 AM.  TIME SEEN: 11:39pm  CHIEF COMPLAINT: Abdominal pain, nausea.  HPI:  HPI Comments: Hendricks MiloSandra M Roberti is a 60 y.o. female with a PMHx of CKD, Asthma, HTN, Scleroderma who presents to the Emergency Department complaining of sudden onset, sharp abdominal pain and nausea onset 3 hours ago. Pt states that her symptoms began after she ate sausage, eggs and a cinnamon roll for dinner at 5:30 pm. Pt also reports nausea, vomiting, diarrhea, leg cramping. Pt states she took her temperature at home using an oral thermometer and reports it was 96. Pt has had years of kidney disease, is not on dialysis and still makes urine. Pt reports she sees a Dr. Allena KatzPatel (neprhology) twice a year but states she does not know what her creatinine levels have been recently. Pt also states she has had a C-section. No other abdominal surgery. Pt reports no alleviating or exacerbating factors to her abdominal pain. Pt denies dysuria, vaginal bleeding or discharge. Has had diarrhea. No vomiting. Pt denies any known sick contact or recent travel. Pt denies being on immunosuppressants. Pt reports allergy to codeine.    ROS: See HPI Constitutional: no fever  Eyes: no drainage  ENT: no runny nose   Cardiovascular:  no chest pain  Resp: no SOB  GI: vomiting GU: no dysuria Integumentary: no rash  Allergy: no hives  Musculoskeletal: no leg swelling  Neurological: no slurred speech ROS otherwise negative  PAST MEDICAL HISTORY/PAST SURGICAL HISTORY:  Past Medical History  Diagnosis Date  . Asthma   . Scleroderma (HCC)   . Esophageal stricture   . Hypertension   . Allergic rhinitis   . GERD (gastroesophageal reflux disease)   . Renal insufficiency     MEDICATIONS:  Prior to Admission medications    Medication Sig Start Date End Date Taking? Authorizing Provider  albuterol (PROAIR HFA) 108 (90 BASE) MCG/ACT inhaler Inhale 2 puffs into the lungs every 6 (six) hours as needed for wheezing. 05/29/15 10/21/17 Yes David S Tysinger, PA-C  albuterol (PROVENTIL) (2.5 MG/3ML) 0.083% nebulizer solution Take 3 mLs (2.5 mg total) by nebulization every 6 (six) hours as needed for wheezing or shortness of breath. 03/20/14  Yes Kermit Baloavid S Tysinger, PA-C  amLODipine (NORVASC) 5 MG tablet Take 10 mg by mouth daily. 06/05/16  Yes Historical Provider, MD  budesonide-formoterol (SYMBICORT) 160-4.5 MCG/ACT inhaler Inhale 2 puffs into the lungs 2 (two) times daily. 05/19/16  Yes Kermit Baloavid S Tysinger, PA-C  cyproheptadine (PERIACTIN) 4 MG tablet Take 4 mg by mouth 2 (two) times daily as needed for allergies or rhinitis.    Yes Historical Provider, MD  lisinopril (PRINIVIL,ZESTRIL) 5 MG tablet Take 5 mg by mouth daily.   Yes Historical Provider, MD  omeprazole (PRILOSEC) 20 MG capsule Take 20 mg by mouth 2 (two) times daily.     Yes Historical Provider, MD    ALLERGIES:  Allergies  Allergen Reactions  . Cephalosporins   . Avelox [Moxifloxacin Hcl In Nacl]     Caused tachycardia  . Codeine   . Penicillins     SOCIAL HISTORY:  Social History  Substance Use Topics  . Smoking status: Never Smoker   . Smokeless tobacco: Never Used  . Alcohol Use: No    FAMILY HISTORY: Family History  Problem Relation Age of Onset  .  Colon cancer Neg Hx   . Heart failure Mother   . Hepatitis Mother     EXAM: BP 100/60 mmHg  Pulse 78  Temp(Src) 97.4 F (36.3 C) (Oral)  Resp 20  SpO2 95% CONSTITUTIONAL: Alert and oriented and responds appropriately to questions. Chronically ill appearing and appears uncomfortable, afebrile, thin HEAD: Normocephalic EYES: Conjunctivae clear, PERRL ENT: normal nose; no rhinorrhea; dry mucous membranes NECK: Supple, no meningismus, no LAD  CARD: RRR; S1 and S2 appreciated; no murmurs, no  clicks, no rubs, no gallops RESP: Normal chest excursion without splinting or tachypnea; breath sounds clear and equal bilaterally; no wheezes, no rhonchi, no rales, no hypoxia or respiratory distress, speaking full sentences ABD/GI: Normal bowel sounds; non-distended; soft, tender throughout lower abdomen, no rebound, positive voluntary guarding, no peritoneal signs BACK:  The back appears normal and is non-tender to palpation, there is no CVA tenderness EXT: Normal ROM in all joints; non-tender to palpation; no edema; normal capillary refill; no cyanosis, no calf tenderness or swelling    SKIN: Normal color for age and race; warm; no rash NEURO: Moves all extremities equally, sensation to light touch intact diffusely, cranial nerves II through XII intact PSYCH: The patient's mood and manner are appropriate. Grooming and personal hygiene are appropriate.  MEDICAL DECISION MAKING: Patient here with lower abdominal pain, nausea and diarrhea that started tonight. She does have soft blood pressure but reports this is chronic. Afebrile and nontoxic appearing. We'll obtain labs, urine and a CT of her abdomen and pelvis.  ED PROGRESS: Patient's labs show leukocytosis of 26,000 with left shift. Lactate is normal. Does have a nitrite-positive UTI. Will obtain urine culture. We'll give ceftriaxone. Cephalosporins are listed as an allergy but she states that she has taken Keflex recently with no symptoms. Discussed with patient that recent antibody use could put her at risk for C. difficile. Will obtain stool cultures.   CT scan shows colitis without obstruction, abscess, perforation. Discussed with pharmacist who recommends Primaxin but can add on Flagyl for anaerobic coverage. We'll discuss with medicine for admission.   3:45 AM  Discussed patient's case with hospitalist, Dr. Clyde LundborgNiu.  Recommend admission to telemetry, observation bed.  I will place holding orders per their request. Patient and family (if  present) updated with plan. Care transferred to hospitalist service.  I reviewed all nursing notes, vitals, pertinent old records, EKGs, labs, imaging (as available).   This chart was scribed in my presence and reviewed by me personally.   Layla MawKristen N Karris Deangelo, DO 06/24/16 [phone]

## 2016-06-23 NOTE — ED Notes (Signed)
Bed: [id number] Expected date:  Expected time:  Means of arrival:  Comments: EMS 59yo F generalized malaise / N/V x 3 hrs

## 2016-06-23 NOTE — ED Notes (Addendum)
Pt BIB EMS from home c/o generalized weakness and aches, nausea, vomiting and diarrhea starting at 6pm. Denies fever, denies SOB. A&Ox4. Ambulatory. Ate cinnamon roll, sausage and eggs for dinner. When asked if sausage was cooked all the way. She got upset and stated that she didn't want to talk about it to EMS.

## 2016-06-23 NOTE — ED Notes (Signed)
On 3rd attempt, was able to get oral temp

## 2016-06-23 NOTE — ED Notes (Addendum)
Unable to get oral temp on pt at this time or at triage. Pt refuses to close her mouth all the way. Will assess rectal temperature.

## 2016-06-24 ENCOUNTER — Encounter (HOSPITAL_COMMUNITY): Payer: Self-pay | Admitting: Internal Medicine

## 2016-06-24 ENCOUNTER — Emergency Department (HOSPITAL_COMMUNITY): Payer: Commercial Managed Care - HMO

## 2016-06-24 DIAGNOSIS — K219 Gastro-esophageal reflux disease without esophagitis: Secondary | ICD-10-CM

## 2016-06-24 DIAGNOSIS — I129 Hypertensive chronic kidney disease with stage 1 through stage 4 chronic kidney disease, or unspecified chronic kidney disease: Secondary | ICD-10-CM | POA: Diagnosis not present

## 2016-06-24 DIAGNOSIS — K529 Noninfective gastroenteritis and colitis, unspecified: Secondary | ICD-10-CM | POA: Diagnosis not present

## 2016-06-24 DIAGNOSIS — I10 Essential (primary) hypertension: Secondary | ICD-10-CM

## 2016-06-24 DIAGNOSIS — N390 Urinary tract infection, site not specified: Secondary | ICD-10-CM | POA: Diagnosis not present

## 2016-06-24 DIAGNOSIS — M349 Systemic sclerosis, unspecified: Secondary | ICD-10-CM

## 2016-06-24 DIAGNOSIS — J45909 Unspecified asthma, uncomplicated: Secondary | ICD-10-CM | POA: Diagnosis not present

## 2016-06-24 DIAGNOSIS — N179 Acute kidney failure, unspecified: Secondary | ICD-10-CM | POA: Diagnosis not present

## 2016-06-24 DIAGNOSIS — E860 Dehydration: Secondary | ICD-10-CM | POA: Diagnosis not present

## 2016-06-24 DIAGNOSIS — N184 Chronic kidney disease, stage 4 (severe): Secondary | ICD-10-CM | POA: Diagnosis not present

## 2016-06-24 DIAGNOSIS — K449 Diaphragmatic hernia without obstruction or gangrene: Secondary | ICD-10-CM | POA: Diagnosis not present

## 2016-06-24 LAB — CBC
HCT: 35.6 % — ABNORMAL LOW (ref 36.0–46.0)
HCT: 39.8 % (ref 36.0–46.0)
HEMOGLOBIN: 12 g/dL (ref 12.0–15.0)
HEMOGLOBIN: 13.5 g/dL (ref 12.0–15.0)
MCH: 30.2 pg (ref 26.0–34.0)
MCH: 30.4 pg (ref 26.0–34.0)
MCHC: 33.7 g/dL (ref 30.0–36.0)
MCHC: 33.9 g/dL (ref 30.0–36.0)
MCV: 89.6 fL (ref 78.0–100.0)
MCV: 89.7 fL (ref 78.0–100.0)
Platelets: 226 10*3/uL (ref 150–400)
Platelets: 282 10*3/uL (ref 150–400)
RBC: 3.97 MIL/uL (ref 3.87–5.11)
RBC: 4.44 MIL/uL (ref 3.87–5.11)
RDW: 12.7 % (ref 11.5–15.5)
RDW: 12.8 % (ref 11.5–15.5)
WBC: 26.2 10*3/uL — ABNORMAL HIGH (ref 4.0–10.5)
WBC: 32.5 10*3/uL — AB (ref 4.0–10.5)

## 2016-06-24 LAB — BASIC METABOLIC PANEL
ANION GAP: 7 (ref 5–15)
BUN: 35 mg/dL — ABNORMAL HIGH (ref 6–20)
CALCIUM: 8.2 mg/dL — AB (ref 8.9–10.3)
CO2: 20 mmol/L — AB (ref 22–32)
Chloride: 109 mmol/L (ref 101–111)
Creatinine, Ser: 2.09 mg/dL — ABNORMAL HIGH (ref 0.44–1.00)
GFR, EST AFRICAN AMERICAN: 29 mL/min — AB (ref >60)
GFR, EST NON AFRICAN AMERICAN: 25 mL/min — AB (ref >60)
Glucose, Bld: 132 mg/dL — ABNORMAL HIGH (ref 65–99)
Potassium: 4.7 mmol/L (ref 3.5–5.1)
Sodium: 136 mmol/L (ref 135–145)

## 2016-06-24 LAB — DIFFERENTIAL
BASOS PCT: 0 %
Basophils Absolute: 0 10*3/uL (ref 0.0–0.1)
EOS ABS: 0 10*3/uL (ref 0.0–0.7)
Eosinophils Relative: 0 %
Lymphocytes Relative: 3 %
Lymphs Abs: 0.8 10*3/uL (ref 0.7–4.0)
MONO ABS: 1.3 10*3/uL — AB (ref 0.1–1.0)
Monocytes Relative: 5 %
Neutro Abs: 24.1 10*3/uL — ABNORMAL HIGH (ref 1.7–7.7)
Neutrophils Relative %: 92 %

## 2016-06-24 LAB — LIPASE, BLOOD: Lipase: 64 U/L — ABNORMAL HIGH (ref 11–51)

## 2016-06-24 LAB — APTT: APTT: 35 s (ref 24–37)

## 2016-06-24 LAB — I-STAT BETA HCG BLOOD, ED (MC, WL, AP ONLY)

## 2016-06-24 LAB — URINALYSIS, ROUTINE W REFLEX MICROSCOPIC
GLUCOSE, UA: NEGATIVE mg/dL
KETONES UR: 15 mg/dL — AB
NITRITE: POSITIVE — AB
PROTEIN: 30 mg/dL — AB
Specific Gravity, Urine: 1.021 (ref 1.005–1.030)
pH: 5 (ref 5.0–8.0)

## 2016-06-24 LAB — GLUCOSE, CAPILLARY: GLUCOSE-CAPILLARY: 108 mg/dL — AB (ref 65–99)

## 2016-06-24 LAB — COMPREHENSIVE METABOLIC PANEL
ALBUMIN: 4.4 g/dL (ref 3.5–5.0)
ALK PHOS: 49 U/L (ref 38–126)
ALT: 13 U/L — ABNORMAL LOW (ref 14–54)
ANION GAP: 10 (ref 5–15)
AST: 27 U/L (ref 15–41)
BILIRUBIN TOTAL: 0.5 mg/dL (ref 0.3–1.2)
BUN: 37 mg/dL — ABNORMAL HIGH (ref 6–20)
CALCIUM: 9.5 mg/dL (ref 8.9–10.3)
CO2: 23 mmol/L (ref 22–32)
Chloride: 102 mmol/L (ref 101–111)
Creatinine, Ser: 2.32 mg/dL — ABNORMAL HIGH (ref 0.44–1.00)
GFR, EST AFRICAN AMERICAN: 26 mL/min — AB (ref >60)
GFR, EST NON AFRICAN AMERICAN: 22 mL/min — AB (ref >60)
Glucose, Bld: 140 mg/dL — ABNORMAL HIGH (ref 65–99)
Potassium: 4.2 mmol/L (ref 3.5–5.1)
SODIUM: 135 mmol/L (ref 135–145)
TOTAL PROTEIN: 7.7 g/dL (ref 6.5–8.1)

## 2016-06-24 LAB — PROTIME-INR
INR: 1.28 (ref 0.00–1.49)
Prothrombin Time: 15.7 seconds — ABNORMAL HIGH (ref 11.6–15.2)

## 2016-06-24 LAB — PROCALCITONIN: Procalcitonin: 6.6 ng/mL

## 2016-06-24 LAB — URINE MICROSCOPIC-ADD ON

## 2016-06-24 LAB — C DIFFICILE QUICK SCREEN W PCR REFLEX
C DIFFICILE (CDIFF) INTERP: NEGATIVE
C DIFFICILE (CDIFF) TOXIN: NEGATIVE
C Diff antigen: NEGATIVE

## 2016-06-24 LAB — LACTIC ACID, PLASMA
LACTIC ACID, VENOUS: 1 mmol/L (ref 0.5–1.9)
LACTIC ACID, VENOUS: 1.2 mmol/L (ref 0.5–1.9)

## 2016-06-24 LAB — I-STAT CG4 LACTIC ACID, ED: Lactic Acid, Venous: 0.68 mmol/L (ref 0.5–1.9)

## 2016-06-24 MED ORDER — DEXTROSE 5 % IV SOLN
1.0000 g | INTRAVENOUS | Status: DC
  Administered 2016-06-25: 1 g via INTRAVENOUS
  Filled 2016-06-24: qty 10

## 2016-06-24 MED ORDER — MORPHINE SULFATE (PF) 2 MG/ML IV SOLN
2.0000 mg | INTRAVENOUS | Status: DC | PRN
  Administered 2016-06-24 (×3): 2 mg via INTRAVENOUS
  Filled 2016-06-24 (×3): qty 1

## 2016-06-24 MED ORDER — ONDANSETRON HCL 4 MG/2ML IJ SOLN: 4.0000 mg | Freq: Three times a day (TID) | INTRAMUSCULAR | Status: DC | PRN

## 2016-06-24 MED ORDER — FAMOTIDINE IN NACL 20-0.9 MG/50ML-% IV SOLN
20.0000 mg | Freq: Two times a day (BID) | INTRAVENOUS | Status: DC
  Administered 2016-06-24 (×2): 20 mg via INTRAVENOUS
  Filled 2016-06-24 (×3): qty 50

## 2016-06-24 MED ORDER — METRONIDAZOLE IN NACL 5-0.79 MG/ML-% IV SOLN
500.0000 mg | Freq: Three times a day (TID) | INTRAVENOUS | Status: DC
  Administered 2016-06-24 – 2016-06-25 (×3): 500 mg via INTRAVENOUS
  Filled 2016-06-24 (×3): qty 100

## 2016-06-24 MED ORDER — SODIUM CHLORIDE 0.9 % IV SOLN
INTRAVENOUS | Status: AC
Start: 2016-06-24 — End: 2016-06-25
  Administered 2016-06-24: 12:00:00 via INTRAVENOUS

## 2016-06-24 MED ORDER — ACETAMINOPHEN 650 MG RE SUPP: 650.0000 mg | Freq: Four times a day (QID) | RECTAL | Status: DC | PRN

## 2016-06-24 MED ORDER — DEXTROSE 5 % IV SOLN
1.0000 g | Freq: Three times a day (TID) | INTRAVENOUS | Status: DC
  Administered 2016-06-24: 1 g via INTRAVENOUS
  Filled 2016-06-24 (×2): qty 1

## 2016-06-24 MED ORDER — ALBUTEROL SULFATE (2.5 MG/3ML) 0.083% IN NEBU: 2.5000 mg | INHALATION_SOLUTION | Freq: Four times a day (QID) | RESPIRATORY_TRACT | Status: DC | PRN

## 2016-06-24 MED ORDER — SODIUM CHLORIDE 0.9 % IV SOLN: INTRAVENOUS | Status: DC

## 2016-06-24 MED ORDER — HYDRALAZINE HCL 20 MG/ML IJ SOLN
5.0000 mg | INTRAMUSCULAR | Status: DC | PRN
Start: 2016-06-24 — End: 2016-06-25

## 2016-06-24 MED ORDER — DEXTROSE 5 % IV SOLN
1.0000 g | Freq: Once | INTRAVENOUS | Status: AC
  Administered 2016-06-24: 1 g via INTRAVENOUS
  Filled 2016-06-24: qty 10

## 2016-06-24 MED ORDER — METRONIDAZOLE IN NACL 5-0.79 MG/ML-% IV SOLN
500.0000 mg | Freq: Once | INTRAVENOUS | Status: AC
  Administered 2016-06-24: 500 mg via INTRAVENOUS
  Filled 2016-06-24: qty 100

## 2016-06-24 MED ORDER — ENOXAPARIN SODIUM 40 MG/0.4ML ~~LOC~~ SOLN: 40.0000 mg | SUBCUTANEOUS | Status: DC

## 2016-06-24 MED ORDER — DIATRIZOATE MEGLUMINE & SODIUM 66-10 % PO SOLN
30.0000 mL | Freq: Once | ORAL | Status: AC
  Administered 2016-06-24: 30 mL via ORAL

## 2016-06-24 MED ORDER — HEPARIN SODIUM (PORCINE) 5000 UNIT/ML IJ SOLN
5000.0000 [IU] | Freq: Three times a day (TID) | INTRAMUSCULAR | Status: DC
  Administered 2016-06-24 – 2016-06-25 (×4): 5000 [IU] via SUBCUTANEOUS
  Filled 2016-06-24 (×4): qty 1

## 2016-06-24 MED ORDER — SODIUM CHLORIDE 0.9 % IV BOLUS (SEPSIS)
500.0000 mL | Freq: Once | INTRAVENOUS | Status: AC
  Administered 2016-06-24: 500 mL via INTRAVENOUS

## 2016-06-24 MED ORDER — SODIUM CHLORIDE 0.9% FLUSH
3.0000 mL | Freq: Two times a day (BID) | INTRAVENOUS | Status: DC
  Administered 2016-06-24: 3 mL via INTRAVENOUS

## 2016-06-24 MED ORDER — CYPROHEPTADINE HCL 4 MG PO TABS
4.0000 mg | ORAL_TABLET | Freq: Two times a day (BID) | ORAL | Status: DC | PRN
  Filled 2016-06-24: qty 1

## 2016-06-24 MED ORDER — ACETAMINOPHEN 325 MG PO TABS
650.0000 mg | ORAL_TABLET | Freq: Four times a day (QID) | ORAL | Status: DC | PRN
  Administered 2016-06-24 – 2016-06-25 (×2): 650 mg via ORAL
  Filled 2016-06-24 (×2): qty 2

## 2016-06-24 MED ORDER — SODIUM CHLORIDE 0.9 % IV BOLUS (SEPSIS)
1000.0000 mL | Freq: Once | INTRAVENOUS | Status: AC
  Administered 2016-06-24: 1000 mL via INTRAVENOUS

## 2016-06-24 MED ORDER — SODIUM CHLORIDE 0.9 % IV BOLUS (SEPSIS)
1000.0000 mL | Freq: Once | INTRAVENOUS | Status: DC
Start: 2016-06-24 — End: 2016-06-24

## 2016-06-24 MED ORDER — SODIUM CHLORIDE 0.9 % IV SOLN
INTRAVENOUS | Status: DC
  Administered 2016-06-24: 06:00:00 via INTRAVENOUS

## 2016-06-24 MED ORDER — MOMETASONE FURO-FORMOTEROL FUM 200-5 MCG/ACT IN AERO
2.0000 | INHALATION_SPRAY | Freq: Two times a day (BID) | RESPIRATORY_TRACT | Status: DC
  Administered 2016-06-24 – 2016-06-25 (×3): 2 via RESPIRATORY_TRACT
  Filled 2016-06-24: qty 8.8

## 2016-06-24 NOTE — ED Notes (Signed)
Hospitalist at bedside 

## 2016-06-24 NOTE — Progress Notes (Signed)
PROGRESS NOTE                                                                                                                                                                                                             Patient Demographics:    [name], is a 60 y.o. female, DOB - 10-14-1956, ZOX:[id number]  Admit date - 06/23/2016   Admitting Physician Lorretta Harp, MD  Outpatient Primary MD for the patient is Carollee Herter, MD  LOS -   Chief Complaint  Patient presents with  . Generalized Body Aches  . Nausea       Brief Narrative    [name] is a 60 y.o. female with medical history significant of hypertension, asthma, scleroderma, allergic rhinitis, esophageal stricture, GERD, CKD-IV, who presents with nausea, vomiting, diarrhea, abdominal pain, increased urinary frequency.  Pt reports that she started having nausea, vomiting, diarrhea and abdominal pain at about 6 PM. Pt states that her symptoms began after she ate sausage, eggs and a cinnamon roll for dinner. She vomited more than 10 times without blood in the vomitus. She had 8-9 bowel movements with loose stool. Her abdominal pain is located in the lower abdomen, constant, 8 out of 10 in severity, nonradiating. It is not aggravated or alleviated by any known factors. Patient has chills, but no fever. She has an increased urinary frequency, but no dysuria or burning on urination. Patient does not have chest pain, cough, shortness of breath, unilateral weakness. No recent antibiotics use. Patient states that her blood pressure is normally running low.  ED Course: pt was found to have WBC 26.2, lactate is 0.68, lipase 64, negative pregnancy test, positive urinalysis for UTI, temperature normal, no tachycardia, no tachypnea, blood pressure 96/63, creatinine 2.32 which was 5.54 on 02/11/10. CT abdomen/pelvis that showed colitis and small hiatal hernia. Patient  is placed on tele bed for observation.    Subjective:    [name] today has, No headache, No chest pain, Still having profuse diarrhea and mild generalized abdominal pain - No Nausea, No new weakness tingling or numbness, No Cough - SOB.    Assessment  & Plan :     1.Colitis most likely infectious. C. difficile PCR is negative, she had sudden onset of abdominal discomfort and diarrhea starting 06/23/2016 evening, no reconnection of exposure to bad  food, no other family members have similar infection. We'll continue IV Flagyl and IV fluids, clear liquid diet and monitor. Anticipate improvement in 1-2 days.  2. UTI. On Rocephin and monitor. Follow cultures. CT scan does not show any urinary obstruction.  3. Massive leukocytosis. Due to #1 and #2 above plan as above.  4. Incidental finding of left kidney stone. No hydronephrosis. Supportive care and outpatient follow-up with PCP and if needed urology.  5. Asthma. Stable supportive care.  6. GERD. On PPI  7. Scleroderma. Stable no acute issues. Not on immunosuppressants.  8. ARF on CKD stage III. Baseline creatinine close to 1.7. Follows with Dr. Allena KatzPatel, ARF due to dehydration from diarrhea, hydrate, hold ACE and monitor.    Family Communication  :  None present  Code Status :  Full  Diet : Clear liquids  Disposition Plan  :  If better home in 1-2 days  Consults  :  None  Procedures  :  CT abdomen and pelvis suggestive of colitis  DVT Prophylaxis  :    Heparin    Lab Results  Component Value Date   PLT 226 06/24/2016    Inpatient Medications  Scheduled Meds: . [START ON 06/25/2016] cefTRIAXone (ROCEPHIN)  IV  1 g Intravenous Q24H  . famotidine (PEPCID) IV  20 mg Intravenous Q12H  . heparin subcutaneous  5,000 Units Subcutaneous Q8H  . metronidazole  500 mg Intravenous Q8H  . mometasone-formoterol  2 puff Inhalation BID  . sodium chloride flush  3 mL Intravenous Q12H   Continuous Infusions: . sodium chloride 75  mL/hr at 06/24/16 1213   PRN Meds:.acetaminophen **OR** [DISCONTINUED] acetaminophen, albuterol, cyproheptadine, hydrALAZINE, morphine injection, ondansetron  Antibiotics  :    Anti-infectives    Start     Dose/Rate Route Frequency Ordered Stop   06/25/16 0300  cefTRIAXone (ROCEPHIN) 1 g in dextrose 5 % 50 mL IVPB     1 g 100 mL/hr over 30 Minutes Intravenous Every 24 hours 06/24/16 0950     06/24/16 1400  metroNIDAZOLE (FLAGYL) IVPB 500 mg     500 mg 100 mL/hr over 60 Minutes Intravenous Every 8 hours 06/24/16 0411     06/24/16 0630  aztreonam (AZACTAM) 1 g in dextrose 5 % 50 mL IVPB  Status:  Discontinued     1 g 100 mL/hr over 30 Minutes Intravenous Every 8 hours 06/24/16 0619 06/24/16 0949   06/24/16 0345  metroNIDAZOLE (FLAGYL) IVPB 500 mg     500 mg 100 mL/hr over 60 Minutes Intravenous  Once 06/24/16 0334 06/24/16 0516   06/24/16 0215  cefTRIAXone (ROCEPHIN) 1 g in dextrose 5 % 50 mL IVPB    Comments:  Patient said she has recently tolerated multiple cephalosporins without any reaction   1 g 100 mL/hr over 30 Minutes Intravenous  Once 06/24/16 0204 06/24/16 0328         Objective:   Filed Vitals:   06/24/16 0157 06/24/16 0351 06/24/16 0445 06/24/16 0834  BP: 96/63 101/63 105/56   Pulse: 77 74 90   Temp: 97.6 F (36.4 C)  97.7 F (36.5 C)   TempSrc: Oral  Oral   Resp: 18 18 18    Height:   5\' 4"  (1.626 m)   Weight:   51.8 kg (114 lb 3.2 oz)   SpO2: 97% 99% 100% 96%    Wt Readings from Last 3 Encounters:  06/24/16 51.8 kg (114 lb 3.2 oz)  05/29/15 53.071 kg (117 lb)  03/20/15  52.617 kg (116 lb)     Intake/Output Summary (Last 24 hours) at 06/24/16 1319 Last data filed at 06/24/16 0800  Gross per 24 hour  Intake      0 ml  Output      0 ml  Net      0 ml     Physical Exam  Awake Alert, Oriented X 3, No new F.N deficits, Normal affect [place].AT,PERRAL Supple Neck,No JVD, No cervical lymphadenopathy appriciated.  Symmetrical Chest wall movement, Good air  movement bilaterally, CTAB RRR,No Gallops,Rubs or new Murmurs, No Parasternal Heave +ve B.Sounds, Abd Soft, Minimal generalized tenderness, No organomegaly appriciated, No rebound - guarding or rigidity. No Cyanosis, Clubbing or edema, No new Rash or bruise       Data Review:    CBC  Recent Labs Lab 06/23/16 2354 06/24/16 0553  WBC 26.2* 32.5*  HGB 13.5 12.0  HCT 39.8 35.6*  PLT 282 226  MCV 89.6 89.7  MCH 30.4 30.2  MCHC 33.9 33.7  RDW 12.7 12.8  LYMPHSABS 0.8  --   MONOABS 1.3*  --   EOSABS 0.0  --   BASOSABS 0.0  --     Chemistries   Recent Labs Lab 06/23/16 2354 06/24/16 0553  NA 135 136  K 4.2 4.7  CL 102 109  CO2 23 20*  GLUCOSE 140* 132*  BUN 37* 35*  CREATININE 2.32* 2.09*  CALCIUM 9.5 8.2*  AST 27  --   ALT 13*  --   ALKPHOS 49  --   BILITOT 0.5  --    ------------------------------------------------------------------------------------------------------------------ No results for input(s): CHOL, HDL, LDLCALC, TRIG, CHOLHDL, LDLDIRECT in the last 72 hours.  No results found for: HGBA1C ------------------------------------------------------------------------------------------------------------------ No results for input(s): TSH, T4TOTAL, T3FREE, THYROIDAB in the last 72 hours.  Invalid input(s): FREET3 ------------------------------------------------------------------------------------------------------------------ No results for input(s): VITAMINB12, FOLATE, FERRITIN, TIBC, IRON, RETICCTPCT in the last 72 hours.  Coagulation profile  Recent Labs Lab 06/24/16 0553  INR 1.28    No results for input(s): DDIMER in the last 72 hours.  Cardiac Enzymes No results for input(s): CKMB, TROPONINI, MYOGLOBIN in the last 168 hours.  Invalid input(s): CK ------------------------------------------------------------------------------------------------------------------ No results found for: BNP  Micro Results Recent Results (from the past 240  hour(s))  Culture, blood (x 2)     Status: None (Preliminary result)   Collection Time: 06/24/16  5:53 AM  Result Value Ref Range Status   Specimen Description BLOOD RIGHT ARM  Final   Special Requests IN PEDIATRIC BOTTLE 2CC  Final   Culture   Final    NO GROWTH < 12 HOURS [facility]    Report Status PENDING  Incomplete  Culture, blood (x 2)     Status: None (Preliminary result)   Collection Time: 06/24/16  5:53 AM  Result Value Ref Range Status   Specimen Description BLOOD RIGHT HAND  Final   Special Requests IN PEDIATRIC BOTTLE 3CC  Final   Culture   Final    NO GROWTH < 12 HOURS [facility]    Report Status PENDING  Incomplete  C difficile quick scan w PCR reflex     Status: None   Collection Time: 06/24/16  8:25 AM  Result Value Ref Range Status   C Diff antigen NEGATIVE NEGATIVE Final   C Diff toxin NEGATIVE NEGATIVE Final   C Diff interpretation Negative for toxigenic C. difficile  Final    Radiology Reports Ct Abdomen Pelvis Wo  Contrast  06/24/2016  CLINICAL DATA:  60 year old female with diffuse abdominal pain EXAM: CT ABDOMEN AND PELVIS WITHOUT CONTRAST TECHNIQUE: Multidetector CT imaging of the abdomen and pelvis was performed following the standard protocol without IV contrast. COMPARISON:  None. FINDINGS: Evaluation of this exam is limited in the absence of intravenous contrast. Bibasilar linear atelectasis/scarring. No intra-abdominal free air. No free fluid. The liver, gallbladder, pancreas, spleen, adrenal glands appear unremarkable. Punctate nonobstructing right renal interpolar calculus. No hydronephrosis. The left kidney is unremarkable. The visualized ureters and urinary bladder appear unremarkable. The uterus, and ovaries are grossly unremarkable. There is segmental thickening and inflammatory changes of the colon primarily involving the transverse colon and the splenic flexure compatible with colitis. This is likely  infectious in etiology. An inflammatory bowel disease or ischemia are less likely but not excluded. Loose stool noted within the colon compatible with diarrheal state. Correlation with clinical exam and stool cultures recommended. There is no pneumatosis. There is also mild apparent thickening of the gastric rugal folds as well as jejunal folds which may be related to underdistention or represent gastroenteritis. Clinical correlation is recommended. Small hiatal hernia. Contrast noted in the distal esophagus likely related to gastroesophageal reflux. There is no evidence of bowel obstruction. Normal appendix. There is mild to moderate aortoiliac atherosclerotic disease. Evaluation of the aorta and IVC is limited in the absence of intravenous contrast. No portal venous gas identified. There is no adenopathy. The abdominal wall soft tissues appear unremarkable. There is osteopenia with mild degenerative changes of the spine. No acute fracture. IMPRESSION: Colitis primarily involving the transverse and descending colon, likely infectious in etiology. Correlation with clinical exam and stool cultures recommended. Diarrheal state. Small hiatal hernia and gastroesophageal reflux. No bowel obstruction. Normal Appendix. Electronically Signed   By: Elgie Collard [name]   On: 06/24/2016 03:03    Time Spent in minutes  30   Kamdin Follett K M.D on 06/24/2016 at 1:19 PM  Between 7am to 7pm - Pager - [phone]  After 7pm go to [email or web address] - password [facility]  [facility] -  Office  [phone]

## 2016-06-24 NOTE — Progress Notes (Signed)
Pharmacy Antibiotic Note  [name] is a 60 y.o. female admitted on 06/23/2016 with UTI, colitis.  Pharmacy has been consulted for aztreonam dosing.  Plan: Aztreonam 1gm iv q8hr  Height: 5\' 4"  (162.6 cm) Weight: 114 lb 3.2 oz (51.8 kg) IBW/kg (Calculated) : 54.7  Temp (24hrs), Avg:97.6 F (36.4 C), Min:97.4 F (36.3 C), Max:97.7 F (36.5 C)   Recent Labs Lab 06/23/16 2354 06/24/16 0214 06/24/16 0553  WBC 26.2*  --  32.5*  CREATININE 2.32*  --   --   LATICACIDVEN  --  0.68  --     Estimated Creatinine Clearance: 21.6 mL/min (by C-G formula based on Cr of 2.32).    Allergies  Allergen Reactions  . Cephalosporins   . Avelox [Moxifloxacin Hcl In Nacl]     Caused tachycardia  . Codeine   . Penicillins     Antimicrobials this admission: Aztreonam 6/28 >> Flagyl 6/28 >> Ceftriaxone 6/28 x1  Dose adjustments this admission: -  Microbiology results: pending  Thank you for allowing pharmacy to be a part of this patient's care.  Aleene DavidsonGrimsley Jr, Elisavet Buehrer Crowford 06/24/2016 6:22 AM

## 2016-06-24 NOTE — ED Notes (Signed)
Daughter Aundra MilletMegan left contact information Aundra MilletMegan (cell)- [phone] Aundra MilletMegan (work)- [phone] Home- [phone]

## 2016-06-24 NOTE — H&P (Addendum)
History and Physical    Caitlyn MiloSandra M Neises EAV:[id number] DOB: 12-21-1956 DOA: 06/23/2016  Referring MD/NP/PA:   PCP: Carollee HerterLALONDE,JOHN CHARLES, MD   Patient coming from:  The patient is coming from home.  At baseline, pt is independent for most of ADL.       Chief Complaint: Nausea, vomiting, diarrhea, abdominal pain, increased urinary frequency  HPI: [name] is a 60 y.o. female with medical history significant of hypertension, asthma, scleroderma, allergic rhinitis, esophageal stricture, GERD, CKD-IV, who presents with nausea, vomiting, diarrhea, abdominal pain, increased urinary frequency.  Pt reports that she started having nausea, vomiting, diarrhea and abdominal pain at about 6 PM.  Pt states that her symptoms began after she ate sausage, eggs and a cinnamon roll for dinner. She vomited more than 10 times without blood in the vomitus. She had 8-9 bowel movements with loose stool. Her abdominal pain is located in the lower abdomen, constant, 8 out of 10 in severity, nonradiating. It is not aggravated or alleviated by any known factors. Patient has chills, but no fever. She has an increased urinary frequency, but no dysuria or burning on urination. Patient does not have chest pain, cough, shortness of breath, unilateral weakness. No recent antibiotics use. Patient states that her blood pressure is normally running low.  ED Course: pt was found to have WBC 26.2, lactate is 0.68, lipase 64, negative pregnancy test, positive urinalysis for UTI, temperature normal, no tachycardia, no tachypnea, blood pressure 96/63, creatinine 2.32 which was 5.54 on 02/11/10. CT abdomen/pelvis that showed colitis and small hiatal hernia. Patient is placed on tele bed for observation.  Review of Systems:   General: no fevers, has chills, no changes in body weight, has poor appetite, has fatigue HEENT: no blurry vision, hearing changes or sore throat Pulm: no dyspnea, coughing, wheezing CV: no chest pain, no  palpitations Abd: has nausea, vomiting, abdominal pain, diarrhea, no constipation GU: no dysuria, burning on urination, has increased urinary frequency, no hematuria  Ext: no leg edema Neuro: no unilateral weakness, numbness, or tingling, no vision change or hearing loss Skin: no rash MSK: No muscle spasm, no deformity, no limitation of range of movement in spin Heme: No easy bruising.  Travel history: No recent long distant travel.  Allergy:  Allergies  Allergen Reactions  . Cephalosporins   . Avelox [Moxifloxacin Hcl In Nacl]     Caused tachycardia  . Codeine   . Penicillins     Past Medical History  Diagnosis Date  . Asthma   . Scleroderma (HCC)   . Esophageal stricture   . Hypertension   . Allergic rhinitis   . GERD (gastroesophageal reflux disease)   . CKD (chronic kidney disease), stage IV [facility](HCC)     Past Surgical History  Procedure Laterality Date  . Hand surgery      left; tendon repair    Social History:  reports that she has never smoked. She has never used smokeless tobacco. She reports that she does not drink alcohol or use illicit drugs.  Family History:  Family History  Problem Relation Age of Onset  . Colon cancer Neg Hx   . Heart failure Mother   . Hepatitis Mother      Prior to Admission medications   Medication Sig Start Date End Date Taking? Authorizing Provider  albuterol (PROAIR HFA) 108 (90 BASE) MCG/ACT inhaler Inhale 2 puffs into the lungs every 6 (six) hours as needed for wheezing. 05/29/15 10/21/17 Yes Jac Canavanavid S Tysinger, PA-C  albuterol (PROVENTIL) (2.5 MG/3ML) 0.083% nebulizer solution Take 3 mLs (2.5 mg total) by nebulization every 6 (six) hours as needed for wheezing or shortness of breath. 03/20/14  Yes Kermit Balo Tysinger, PA-C  amLODipine (NORVASC) 5 MG tablet Take 10 mg by mouth daily. 06/05/16  Yes Historical Provider, MD  budesonide-formoterol (SYMBICORT) 160-4.5 MCG/ACT inhaler Inhale 2 puffs into the lungs 2 (two) times daily. 05/19/16   Yes Kermit Balo Tysinger, PA-C  cyproheptadine (PERIACTIN) 4 MG tablet Take 4 mg by mouth 2 (two) times daily as needed for allergies or rhinitis.    Yes Historical Provider, MD  lisinopril (PRINIVIL,ZESTRIL) 5 MG tablet Take 5 mg by mouth daily.   Yes Historical Provider, MD  omeprazole (PRILOSEC) 20 MG capsule Take 20 mg by mouth 2 (two) times daily.     Yes Historical Provider, MD    Physical Exam: Filed Vitals:   06/23/16 2227 06/23/16 2315 06/24/16 0157 06/24/16 0351  BP: 100/60  96/63 101/63  Pulse: 78  77 74  Temp:  97.4 F (36.3 C) 97.6 F (36.4 C)   TempSrc:  Oral Oral   Resp: 20  18 18   SpO2: 95%  97% 99%   General: Not in acute distress HEENT:       Eyes: PERRL, EOMI, no scleral icterus.       ENT: No discharge from the ears and nose, no pharynx injection, no tonsillar enlargement.        Neck: No JVD, no bruit, no mass felt. Heme: No neck lymph node enlargement. Cardiac: S1/S2, RRR, No murmurs, No gallops or rubs. Pulm: No rales, wheezing, rhonchi or rubs. Abd: Soft, nondistended, has tenderness in lower abdomen, no rebound pain, no organomegaly, BS present. GU: No hematuria Ext: No pitting leg edema bilaterally. 2+DP/PT pulse bilaterally. Musculoskeletal: No joint deformities, No joint redness or warmth, no limitation of ROM in spin. Skin: No rashes.  Neuro: Alert, oriented X3, cranial nerves II-XII grossly intact, moves all extremities normally. Psych: Patient is not psychotic, no suicidal or hemocidal ideation.  Labs on Admission: I have personally reviewed following labs and imaging studies  CBC:  Recent Labs Lab 06/23/16 2354  WBC 26.2*  NEUTROABS 24.1*  HGB 13.5  HCT 39.8  MCV 89.6  PLT 282   Basic Metabolic Panel:  Recent Labs Lab 06/23/16 2354  NA 135  K 4.2  CL 102  CO2 23  GLUCOSE 140*  BUN 37*  CREATININE 2.32*  CALCIUM 9.5   GFR: CrCl cannot be calculated (Unknown ideal weight.). Liver Function Tests:  Recent Labs Lab  06/23/16 2354  AST 27  ALT 13*  ALKPHOS 49  BILITOT 0.5  PROT 7.7  ALBUMIN 4.4    Recent Labs Lab 06/23/16 2354  LIPASE 64*   No results for input(s): AMMONIA in the last 168 hours. Coagulation Profile: No results for input(s): INR, PROTIME in the last 168 hours. Cardiac Enzymes: No results for input(s): CKTOTAL, CKMB, CKMBINDEX, TROPONINI in the last 168 hours. BNP (last 3 results) No results for input(s): PROBNP in the last 8760 hours. HbA1C: No results for input(s): HGBA1C in the last 72 hours. CBG: No results for input(s): GLUCAP in the last 168 hours. Lipid Profile: No results for input(s): CHOL, HDL, LDLCALC, TRIG, CHOLHDL, LDLDIRECT in the last 72 hours. Thyroid Function Tests: No results for input(s): TSH, T4TOTAL, FREET4, T3FREE, THYROIDAB in the last 72 hours. Anemia Panel: No results for input(s): VITAMINB12, FOLATE, FERRITIN, TIBC, IRON, RETICCTPCT in the last 72 hours.  Urine analysis:    Component Value Date/Time   COLORURINE RED* 06/24/2016 0132   APPEARANCEUR TURBID* 06/24/2016 0132   LABSPEC 1.021 06/24/2016 0132   PHURINE 5.0 06/24/2016 0132   GLUCOSEU NEGATIVE 06/24/2016 0132   GLUCOSEU Negative 02/03/2010 0947   HGBUR MODERATE* 06/24/2016 0132   BILIRUBINUR MODERATE* 06/24/2016 0132   KETONESUR 15* 06/24/2016 0132   PROTEINUR 30* 06/24/2016 0132   UROBILINOGEN 0.2 02/08/2010 0744   NITRITE POSITIVE* 06/24/2016 0132   LEUKOCYTESUR MODERATE* 06/24/2016 0132   Sepsis Labs: @LABRCNTIP (procalcitonin:4,lacticidven:4) )No results found for this or any previous visit (from the past 240 hour(s)).   Radiological Exams on Admission: Ct Abdomen Pelvis Wo Contrast  06/24/2016  CLINICAL DATA:  60 year old female with diffuse abdominal pain EXAM: CT ABDOMEN AND PELVIS WITHOUT CONTRAST TECHNIQUE: Multidetector CT imaging of the abdomen and pelvis was performed following the standard protocol without IV contrast. COMPARISON:  None. FINDINGS: Evaluation of this  exam is limited in the absence of intravenous contrast. Bibasilar linear atelectasis/scarring. No intra-abdominal free air. No free fluid. The liver, gallbladder, pancreas, spleen, adrenal glands appear unremarkable. Punctate nonobstructing right renal interpolar calculus. No hydronephrosis. The left kidney is unremarkable. The visualized ureters and urinary bladder appear unremarkable. The uterus, and ovaries are grossly unremarkable. There is segmental thickening and inflammatory changes of the colon primarily involving the transverse colon and the splenic flexure compatible with colitis. This is likely infectious in etiology. An inflammatory bowel disease or ischemia are less likely but not excluded. Loose stool noted within the colon compatible with diarrheal state. Correlation with clinical exam and stool cultures recommended. There is no pneumatosis. There is also mild apparent thickening of the gastric rugal folds as well as jejunal folds which may be related to underdistention or represent gastroenteritis. Clinical correlation is recommended. Small hiatal hernia. Contrast noted in the distal esophagus likely related to gastroesophageal reflux. There is no evidence of bowel obstruction. Normal appendix. There is mild to moderate aortoiliac atherosclerotic disease. Evaluation of the aorta and IVC is limited in the absence of intravenous contrast. No portal venous gas identified. There is no adenopathy. The abdominal wall soft tissues appear unremarkable. There is osteopenia with mild degenerative changes of the spine. No acute fracture. IMPRESSION: Colitis primarily involving the transverse and descending colon, likely infectious in etiology. Correlation with clinical exam and stool cultures recommended. Diarrheal state. Small hiatal hernia and gastroesophageal reflux. No bowel obstruction. Normal Appendix. Electronically Signed   By: Elgie CollardArash  Radparvar [name]   On: 06/24/2016 03:03     EKG: Not done in ED,  will get one.   Assessment/Plan Principal Problem:   Colitis Active Problems:   HYPERTENSION, BENIGN   Intrinsic asthma   PROGRESSIVE SYSTEMIC SCLEROSIS   GERD (gastroesophageal reflux disease)   UTI (lower urinary tract infection)   CKD (chronic kidney disease), stage IV (HCC)   Colitis: CT-abd/pelvis showed colitis primarily involving the transverse and descending colon, likely infectious in etiology. Patient blood pressure is soft, which is normal to her.   - will place on tele bed for obs - prn morphine for pain (patient cannot tolerate oral pain medication). - prn  Zofran for nausea  - Will check C diff pcr, GI pathogen panel - Blood culture x 2 - IV antibiotics: Flagyl plus aztreonam (patient is allergic to cephalosporin and penicillin, she received 1 dose of Rocephin in ED)  At risk of developing sepsis: Patient has leukocytosis with WBC 26.2, lactate normal. Currently pt does not meet criteria for sepsis,  but patient is at risk of developing sepsis. -will get Procalcitonin and trend lactic acid levels per sepsis protocol. -IVF: 2.5L of NS bolus in ED, followed by 100 cc/h   Sepsis - Assessment  Performed at:   3:51 AM  Vitals     Blood pressure 101/63, pulse 74, temperature 97.6 F (36.4 C), temperature source Oral, resp. rate 18, SpO2 99 %.  Heart:     Regular rate and rhythm  Lungs:    CTA  Capillary Refill:   <2 sec  Peripheral Pulse:   Radial pulse palpable  Skin:     Normal Color  HYPERTENSION, BENIGN: Blood pressure is soft on admission. Pt is on low-dose lisinopril 5 mg daily and amlodipine 5 mg daily -Hold her lisinopril and amlodipine  Asthma: stable. No wheezing or rhonchi on auscultation. -When necessary albuterol nebulizer -Dulera inhaler  Scleroderma: not on immunosuppressant. Patient is on low-dose of lisinopril at home. -Hold lisinopril due to risk of developing sepsis, we need to restart lisinopril as well as infection is  controlled.  GERD: -will switch PPI to pepcid IV until C diff pcr negative  UTI (lower urinary tract infection): Patient has an increased urinary frequency and a positive urinalysis, consistent with UTI. -On aztreonam as above -Follow up urine culture  CKD (chronic kidney disease), stage IV [facility]): Renal function has improved. Creatinine 2.32, which was 5.54 on 02/14/10. Patient has been followed up by renal, Dr. Allena Katz, last seen was on 02/2016. -Follow-up renal function by BMP   DVT ppx: SQ Heparin (Pt has worsening CKD-IV, sq heparin is better choice than sq Lovenox) Code Status: Full code Family Communication: None at bed side. Disposition Plan:  Anticipate discharge back to previous home environment Consults called:  none Admission status: Obs / tele    Date of Service 06/24/2016    Lorretta Harp [facility] Pager [phone]  If 7PM-7AM, please contact night-coverage [email or web address] Password [facility] 06/24/2016, 4:18 AM

## 2016-06-24 NOTE — ED Notes (Signed)
Pt transferred to CT.

## 2016-06-25 DIAGNOSIS — K529 Noninfective gastroenteritis and colitis, unspecified: Secondary | ICD-10-CM | POA: Diagnosis not present

## 2016-06-25 LAB — CBC
HCT: 30.3 % — ABNORMAL LOW (ref 36.0–46.0)
Hemoglobin: 10.2 g/dL — ABNORMAL LOW (ref 12.0–15.0)
MCH: 30.8 pg (ref 26.0–34.0)
MCHC: 33.7 g/dL (ref 30.0–36.0)
MCV: 91.5 fL (ref 78.0–100.0)
Platelets: 197 10*3/uL (ref 150–400)
RBC: 3.31 MIL/uL — ABNORMAL LOW (ref 3.87–5.11)
RDW: 13.3 % (ref 11.5–15.5)
WBC: 19 10*3/uL — ABNORMAL HIGH (ref 4.0–10.5)

## 2016-06-25 LAB — BASIC METABOLIC PANEL
Anion gap: 6 (ref 5–15)
BUN: 21 mg/dL — ABNORMAL HIGH (ref 6–20)
CHLORIDE: 110 mmol/L (ref 101–111)
CO2: 19 mmol/L — AB (ref 22–32)
Calcium: 7.9 mg/dL — ABNORMAL LOW (ref 8.9–10.3)
Creatinine, Ser: 1.8 mg/dL — ABNORMAL HIGH (ref 0.44–1.00)
GFR calc non Af Amer: 30 mL/min — ABNORMAL LOW (ref >60)
GFR, EST AFRICAN AMERICAN: 35 mL/min — AB (ref >60)
Glucose, Bld: 98 mg/dL (ref 65–99)
Potassium: 4.3 mmol/L (ref 3.5–5.1)
Sodium: 135 mmol/L (ref 135–145)

## 2016-06-25 LAB — GLUCOSE, CAPILLARY: Glucose-Capillary: 85 mg/dL (ref 65–99)

## 2016-06-25 MED ORDER — CEFPODOXIME PROXETIL 200 MG PO TABS: 200.0000 mg | ORAL_TABLET | Freq: Two times a day (BID) | ORAL | Status: DC

## 2016-06-25 MED ORDER — LOPERAMIDE HCL 2 MG PO TABS: 2.0000 mg | ORAL_TABLET | Freq: Four times a day (QID) | ORAL | Status: DC | PRN

## 2016-06-25 MED ORDER — LISINOPRIL 5 MG PO TABS: 5.0000 mg | ORAL_TABLET | Freq: Every day | ORAL | Status: DC

## 2016-06-25 MED ORDER — METRONIDAZOLE 500 MG PO TABS: 500.0000 mg | ORAL_TABLET | Freq: Three times a day (TID) | ORAL | Status: DC

## 2016-06-25 NOTE — Discharge Summary (Signed)
[name] [id number] DOB: July 05, 1956 DOA: 06/23/2016  PCP: Carollee HerterLALONDE,JOHN CHARLES, MD  Admit date: 06/23/2016  Discharge date: 06/25/2016  Admitted From: Home   Disposition:  Home   Recommendations for Outpatient Follow-up:   Follow up with PCP in 1-2 weeks  Please obtain BMP/CBC in one week, 2 view CXR (see Discharge instructions)   PCP Please follow up on the following pending results: Final urine and Blood culture results, kindly review CT scan report. Patient might need one-time outpatient urology follow-up.   Home Health: None   Equipment/Devices: None  Discharge Condition: Stable    CODE STATUS: Full   Diet Recommendation: Soft daily, avoid dairy products for 3-4 days Consultations: None  Brief history of present illness from the day of admission and additional interim summary    [name] is a 60 y.o. female with medical history significant of hypertension, asthma, scleroderma, allergic rhinitis, esophageal stricture, GERD, CKD-IV, who presents with nausea, vomiting, diarrhea, abdominal pain, increased urinary frequency.  Pt reports that she started having nausea, vomiting, diarrhea and abdominal pain at about 6 PM. Pt states that her symptoms began after she ate sausage, eggs and a cinnamon roll for dinner. She vomited more than 10 times without blood in the vomitus. She had 8-9 bowel movements with loose stool. Her abdominal pain is located in the lower abdomen, constant, 8 out of 10 in severity, nonradiating. It is not aggravated or alleviated by any known factors. Patient has chills, but no fever. She has an increased urinary frequency, but no dysuria or burning on urination. Patient does not have chest pain, cough, shortness of breath, unilateral weakness. No recent antibiotics use. Patient  states that her blood pressure is normally running low.  ED Course: pt was found to have WBC 26.2, lactate is 0.68, lipase 64, negative pregnancy test, positive urinalysis for UTI, temperature normal, no tachycardia, no tachypnea, blood pressure 96/63, creatinine 2.32 which was 5.54 on 02/11/10. CT abdomen/pelvis that showed colitis and small hiatal hernia. Patient is placed on tele bed for observation.  Hospital issues addressed     1.Colitis most likely infectious viral vs bacterial Likely from food poisoning as it was sudden onset after supper. C. difficile PCR is negative, she had sudden onset of abdominal discomfort and diarrhea starting 06/23/2016 evening, no recollection of exposure to bad food, no other family members had similar symptoms. She was kept on IV Flagyl, Rocephin and IV fluids for presumed infectious gastroenteritis with excellent improvement, white count has come down from 32,000-19,000, she is almost completely symptom-free with one bowel movement in the last 18 hours, we'll discharge her on 5 more days of oral Vantin and Flagyl along with as needed Imodium.  2. UTI. Pending final cultures, responded well to IV [facility]cephin [facility] more [facility].  3. Massive leukocytosis. Due to #1 and #2 above plan as above.  4. Incidental finding of left kidney stone. No hydronephrosis. Supportive care and outpatient follow-up with PCP and if needed urology.  5. Asthma. Stable supportive care.  6. GERD. On PPI  7. Scleroderma. Stable no acute issues. Not on immunosuppressants.  8. ARF on CKD stage III. Baseline creatinine close to 1.7. Follows with Dr. Allena Katz, ARF due to dehydration from diarrhea, loose to baseline after hydration, hold ACE inhibitor for 3 more days and follow with PCP for a BMP recheck, also requested to follow with her primary nephrologist Dr. Allena Katz in a week.     Discharge diagnosis     Principal Problem:   Colitis Active Problems:    HYPERTENSION, BENIGN   Intrinsic asthma   PROGRESSIVE SYSTEMIC SCLEROSIS   GERD (gastroesophageal reflux disease)   UTI (lower urinary tract infection)   CKD (chronic kidney disease), stage IV (HCC)    Discharge instructions    Discharge Instructions    Diet - low sodium heart healthy    Complete by:  As directed      Discharge instructions    Complete by:  As directed   Follow with Primary MD Carollee Herter, MD in 7 days   Get CBC, CMP, 2 view Chest X ray checked  by Primary MD next visit ( we routinely change or add medications that can affect your baseline labs and fluid status, therefore we recommend that you get the mentioned basic workup next visit with your PCP, your PCP may decide not to get them or add new tests based on their clinical decision)   Activity: As tolerated with Full fall precautions use walker/cane & assistance as needed   Disposition Home     Diet:   Soft and advance as tolerated. Minimize dairy products for 3-4 days.  For Heart failure patients - Check your Weight same time everyday, if you gain over 2 pounds, or you develop in leg swelling, experience more shortness of breath or chest pain, call your Primary MD immediately. Follow Cardiac Low Salt Diet and 1.5 lit/day fluid restriction.   On your next visit with your primary care physician please Get Medicines reviewed and adjusted.   Please request your Prim.MD to go over all Hospital Tests and Procedure/Radiological results at the follow up, please get all Hospital records sent to your Prim MD by signing hospital release before you go home.   If you experience worsening of your admission symptoms, develop shortness of breath, life threatening emergency, suicidal or homicidal thoughts you must seek medical attention immediately by calling 911 or calling your MD immediately  if symptoms less severe.  You Must read complete instructions/literature along with all the possible adverse reactions/side  effects for all the Medicines you take and that have been prescribed to you. Take any new Medicines after you have completely understood and accpet all the possible adverse reactions/side effects.   Do not drive, operate heavy machinery, perform activities at heights, swimming or participation in water activities or provide baby sitting services if your were admitted for syncope or siezures until you have seen by Primary MD or a Neurologist and advised to do so again.  Do not drive when taking Pain medications.    Do not take more than prescribed Pain, Sleep and Anxiety Medications  Special Instructions: If you have smoked or chewed Tobacco  in the last 2 yrs please stop smoking, stop any regular Alcohol  and or any Recreational drug use.  Wear Seat belts while driving.   Please note  You were cared for by a hospitalist during your hospital stay. If you have any questions about your discharge medications  or the care you received while you were in the hospital after you are discharged, you can call the unit and asked to speak with the hospitalist on call if the hospitalist that took care of you is not available. Once you are discharged, your primary care physician will handle any further medical issues. Please note that NO REFILLS for any discharge medications will be authorized once you are discharged, as it is imperative that you return to your primary care physician (or establish a relationship with a primary care physician if you do not have one) for your aftercare needs so that they can reassess your need for medications and monitor your lab values.     Increase activity slowly    Complete by:  As directed            Discharge Medications     Medication List    TAKE these medications        albuterol (2.5 MG/3ML) 0.083% nebulizer solution  Commonly known as:  PROVENTIL  Take 3 mLs (2.5 mg total) by nebulization every 6 (six) hours as needed for wheezing or shortness of breath.      albuterol 108 (90 Base) MCG/ACT inhaler  Commonly known as:  PROAIR HFA  Inhale 2 puffs into the lungs every 6 (six) hours as needed for wheezing.     amLODipine 5 MG tablet  Commonly known as:  NORVASC  Take 10 mg by mouth daily.     budesonide-formoterol 160-4.5 MCG/ACT inhaler  Commonly known as:  SYMBICORT  Inhale 2 puffs into the lungs 2 (two) times daily.     cefpodoxime 200 MG tablet  Commonly known as:  VANTIN  Take 1 tablet (200 mg total) by mouth 2 (two) times daily.     cyproheptadine 4 MG tablet  Commonly known as:  PERIACTIN  Take 4 mg by mouth 2 (two) times daily as needed for allergies or rhinitis.     lisinopril 5 MG tablet  Commonly known as:  PRINIVIL,ZESTRIL  Take 1 tablet (5 mg total) by mouth daily.  Start taking on:  06/28/2016     loperamide 2 MG tablet  Commonly known as:  IMODIUM A-D  Take 1 tablet (2 mg total) by mouth 4 (four) times daily as needed for diarrhea or loose stools.     metroNIDAZOLE 500 MG tablet  Commonly known as:  FLAGYL  Take 1 tablet (500 mg total) by mouth 3 (three) times daily.     omeprazole 20 MG capsule  Commonly known as:  PRILOSEC  Take 20 mg by mouth 2 (two) times daily.        Allergies  Allergen Reactions  . Avelox [Moxifloxacin Hcl In Nacl]     Caused tachycardia  . Codeine Hives and Other (See Comments)    hallucination  . Penicillins Hives        Follow-up Information    Follow up with Carollee Herter, MD. Schedule an appointment as soon as possible for a visit in 1 week.   Specialty:  Family Medicine   Contact information:   [address] [place] [address] [phone]       Follow up with Dagoberto Ligas., MD. Schedule an appointment as soon as possible for a visit in 1 week.   Specialty:  Nephrology   Contact information:   [address] [place] [address] [phone]        Major procedures and Radiology Reports - PLEASE review detailed and final reports for  all details,  in brief -     Ct Abdomen Pelvis Wo Contrast  06/24/2016  CLINICAL DATA:  60 year old female with diffuse abdominal pain EXAM: CT ABDOMEN AND PELVIS WITHOUT CONTRAST TECHNIQUE: Multidetector CT imaging of the abdomen and pelvis was performed following the standard protocol without IV contrast. COMPARISON:  None. FINDINGS: Evaluation of this exam is limited in the absence of intravenous contrast. Bibasilar linear atelectasis/scarring. No intra-abdominal free air. No free fluid. The liver, gallbladder, pancreas, spleen, adrenal glands appear unremarkable. Punctate nonobstructing right renal interpolar calculus. No hydronephrosis. The left kidney is unremarkable. The visualized ureters and urinary bladder appear unremarkable. The uterus, and ovaries are grossly unremarkable. There is segmental thickening and inflammatory changes of the colon primarily involving the transverse colon and the splenic flexure compatible with colitis. This is likely infectious in etiology. An inflammatory bowel disease or ischemia are less likely but not excluded. Loose stool noted within the colon compatible with diarrheal state. Correlation with clinical exam and stool cultures recommended. There is no pneumatosis. There is also mild apparent thickening of the gastric rugal folds as well as jejunal folds which may be related to underdistention or represent gastroenteritis. Clinical correlation is recommended. Small hiatal hernia. Contrast noted in the distal esophagus likely related to gastroesophageal reflux. There is no evidence of bowel obstruction. Normal appendix. There is mild to moderate aortoiliac atherosclerotic disease. Evaluation of the aorta and IVC is limited in the absence of intravenous contrast. No portal venous gas identified. There is no adenopathy. The abdominal wall soft tissues appear unremarkable. There is osteopenia with mild degenerative changes of the spine. No acute fracture. IMPRESSION: Colitis primarily  involving the transverse and descending colon, likely infectious in etiology. Correlation with clinical exam and stool cultures recommended. Diarrheal state. Small hiatal hernia and gastroesophageal reflux. No bowel obstruction. Normal Appendix. Electronically Signed   By: Elgie Collard [name]   On: 06/24/2016 03:03    Micro Results      Recent Results (from the past 240 hour(s))  Culture, blood (x 2)     Status: None (Preliminary result)   Collection Time: 06/24/16  5:53 AM  Result Value Ref Range Status   Specimen Description BLOOD RIGHT ARM  Final   Special Requests IN PEDIATRIC BOTTLE 2CC  Final   Culture   Final    NO GROWTH < 12 HOURS Performed at [facility]    Report Status PENDING  Incomplete  Culture, blood (x 2)     Status: None (Preliminary result)   Collection Time: 06/24/16  5:53 AM  Result Value Ref Range Status   Specimen Description BLOOD RIGHT HAND  Final   Special Requests IN PEDIATRIC BOTTLE 3CC  Final   Culture   Final    NO GROWTH < 12 HOURS Performed at [facility]    Report Status PENDING  Incomplete  C difficile quick scan w PCR reflex     Status: None   Collection Time: 06/24/16  8:25 AM  Result Value Ref Range Status   C Diff antigen NEGATIVE NEGATIVE Final   C Diff toxin NEGATIVE NEGATIVE Final   C Diff interpretation Negative for toxigenic C. difficile  Final    Today   Subjective   Coral Spikes today has no headache,no chest abdominal pain,no new weakness tingling or numbness, feels much better wants to go home today.     Objective   Blood pressure 102/48, pulse 69, temperature 98.5 F (36.9 C), temperature source Oral, resp.  rate 14, height 5\' 4"  (1.626 m), weight 51.8 kg (114 lb 3.2 oz), SpO2 99 %.   Intake/Output Summary (Last 24 hours) at 06/25/16 0908 Last data filed at 06/24/16 1800  Gross per 24 hour  Intake    480 ml  Output      0 ml  Net    480 ml    Exam Awake Alert, Oriented x 3, No new F.N deficits,  Normal affect Superior.AT,PERRAL Supple Neck,No JVD, No cervical lymphadenopathy appriciated.  Symmetrical Chest wall movement, Good air movement bilaterally, CTAB RRR,No Gallops,Rubs or new Murmurs, No Parasternal Heave +ve B.Sounds, Abd Soft, Non tender, No organomegaly appriciated, No rebound -guarding or rigidity. No Cyanosis, Clubbing or edema, No new Rash or bruise   Data Review   CBC w Diff: Lab Results  Component Value Date   WBC 19.0* 06/25/2016   HGB 10.2* 06/25/2016   HCT 30.3* 06/25/2016   PLT 197 06/25/2016   LYMPHOPCT 3 06/23/2016   MONOPCT 5 06/23/2016   EOSPCT 0 06/23/2016   BASOPCT 0 06/23/2016    CMP: Lab Results  Component Value Date   NA 135 06/25/2016   K 4.3 06/25/2016   CL 110 06/25/2016   CO2 19* 06/25/2016   BUN 21* 06/25/2016   CREATININE 1.80* 06/25/2016   PROT 7.7 06/23/2016   ALBUMIN 4.4 06/23/2016   BILITOT 0.5 06/23/2016   ALKPHOS 49 06/23/2016   AST 27 06/23/2016   ALT 13* 06/23/2016    Total Time in preparing paper work, data evaluation and todays exam - 35 minutes  Susa RaringSINGH,Dalasia Predmore K M.D on 06/25/2016 at 9:08 AM  [facility]   Office  [phone]

## 2016-06-25 NOTE — Discharge Instructions (Signed)
Follow with Primary MD Carollee HerterLALONDE,JOHN CHARLES, MD in 7 days   Get CBC, CMP, 2 view Chest X ray checked  by Primary MD next visit ( we routinely change or add medications that can affect your baseline labs and fluid status, therefore we recommend that you get the mentioned basic workup next visit with your PCP, your PCP may decide not to get them or add new tests based on their clinical decision)   Activity: As tolerated with Full fall precautions use walker/cane & assistance as needed   Disposition Home     Diet:   Soft and advance as tolerated. Minimize dairy products for 3-4 days.  For Heart failure patients - Check your Weight same time everyday, if you gain over 2 pounds, or you develop in leg swelling, experience more shortness of breath or chest pain, call your Primary MD immediately. Follow Cardiac Low Salt Diet and 1.5 lit/day fluid restriction.   On your next visit with your primary care physician please Get Medicines reviewed and adjusted.   Please request your Prim.MD to go over all Hospital Tests and Procedure/Radiological results at the follow up, please get all Hospital records sent to your Prim MD by signing hospital release before you go home.   If you experience worsening of your admission symptoms, develop shortness of breath, life threatening emergency, suicidal or homicidal thoughts you must seek medical attention immediately by calling 911 or calling your MD immediately  if symptoms less severe.  You Must read complete instructions/literature along with all the possible adverse reactions/side effects for all the Medicines you take and that have been prescribed to you. Take any new Medicines after you have completely understood and accpet all the possible adverse reactions/side effects.   Do not drive, operate heavy machinery, perform activities at heights, swimming or participation in water activities or provide baby sitting services if your were admitted for syncope or  siezures until you have seen by Primary MD or a Neurologist and advised to do so again.  Do not drive when taking Pain medications.    Do not take more than prescribed Pain, Sleep and Anxiety Medications  Special Instructions: If you have smoked or chewed Tobacco  in the last 2 yrs please stop smoking, stop any regular Alcohol  and or any Recreational drug use.  Wear Seat belts while driving.   Please note  You were cared for by a hospitalist during your hospital stay. If you have any questions about your discharge medications or the care you received while you were in the hospital after you are discharged, you can call the unit and asked to speak with the hospitalist on call if the hospitalist that took care of you is not available. Once you are discharged, your primary care physician will handle any further medical issues. Please note that NO REFILLS for any discharge medications will be authorized once you are discharged, as it is imperative that you return to your primary care physician (or establish a relationship with a primary care physician if you do not have one) for your aftercare needs so that they can reassess your need for medications and monitor your lab values.

## 2016-06-25 NOTE — Progress Notes (Signed)
Went over d/c instructions.  Patient verbalized understanding.  Left hospital via w/c with all belongings and hard script.

## 2016-06-26 ENCOUNTER — Telehealth: Payer: Self-pay | Admitting: Family Medicine

## 2016-06-26 LAB — URINE CULTURE

## 2016-06-26 LAB — MISC LABCORP TEST (SEND OUT): LABCORP TEST CODE: 183480

## 2016-06-26 NOTE — Telephone Encounter (Signed)
[facility] lab [facility] on [facility] showing that she was positive for C Diff. Olegario MessierKathy called Dr Susann GivensLalonde to let him know this since she was currently on the phone e with the patient attempting to schedule a Hospital F/U appt.

## 2016-06-26 NOTE — Telephone Encounter (Signed)
Called pt back and gave her the verbal instructions JCL gave me via phone call. She is to continue on antibiotic and flagyl until appt with us. Pt states she only has enough flagyl for four days. [facility].

## 2016-06-26 NOTE — Progress Notes (Signed)
06/26/16 11:00 a.m.  Since patient is no longer in hospital, I received a phoned report from lab; the stool for GI pathogens showed "C Diff toxin detected in PCR". I asked secretary Chad CordialJustin McGeehee to research chart to determine which physician needed to be notified. Jill AlexandersJustin called office of Dr. Sharlot GowdaJohn Lalonde at [phone] and gave results to Vella Kohlerammy Dove who accepted responsibility for making appropriate physician notification. Pamala DuffelShearer Burnell Matlin, RN, BSN, Engineer, agriculturalursing Administration Coordinator

## 2016-06-26 NOTE — Telephone Encounter (Signed)
Pt called back and stated she will also be out of antibiotic. So please refill too.

## 2016-06-29 ENCOUNTER — Telehealth: Payer: Self-pay | Admitting: Family Medicine

## 2016-06-29 LAB — CULTURE, BLOOD (ROUTINE X 2)
CULTURE: NO GROWTH
Culture: NO GROWTH

## 2016-06-29 MED ORDER — METRONIDAZOLE 500 MG PO TABS: 500.0000 mg | ORAL_TABLET | Freq: Three times a day (TID) | ORAL | Status: DC

## 2016-06-29 NOTE — Telephone Encounter (Signed)
Molli KnockOkay but this is a Licensed conveyancerhane pt

## 2016-06-29 NOTE — Telephone Encounter (Signed)
Called in metronidazole via pharmacy voicemail. Pt aware. Trixie Rude/RLB

## 2016-06-29 NOTE — Telephone Encounter (Signed)
Pt called stating that her kidney doctor's office called her stating they want to see her to f/u from hosp stay. Pt said she will call us back when she has appt info( date, time, who she will be seeing) because she will need a referral from our office for that appt

## 2016-07-02 ENCOUNTER — Ambulatory Visit (INDEPENDENT_AMBULATORY_CARE_PROVIDER_SITE_OTHER): Payer: Commercial Managed Care - HMO | Admitting: Family Medicine

## 2016-07-02 VITALS — BP 90/60 | HR 70 | Ht 64.0 in | Wt 116.0 lb

## 2016-07-02 DIAGNOSIS — N3090 Cystitis, unspecified without hematuria: Secondary | ICD-10-CM | POA: Diagnosis not present

## 2016-07-02 DIAGNOSIS — N183 Chronic kidney disease, stage 3 unspecified: Secondary | ICD-10-CM

## 2016-07-02 DIAGNOSIS — R319 Hematuria, unspecified: Secondary | ICD-10-CM

## 2016-07-02 DIAGNOSIS — A0472 Enterocolitis due to Clostridium difficile, not specified as recurrent: Secondary | ICD-10-CM

## 2016-07-02 DIAGNOSIS — A047 Enterocolitis due to Clostridium difficile: Secondary | ICD-10-CM

## 2016-07-02 LAB — POCT URINALYSIS DIPSTICK
BILIRUBIN UA: NEGATIVE
GLUCOSE UA: NEGATIVE
Ketones, UA: NEGATIVE
Nitrite, UA: NEGATIVE
Protein, UA: NEGATIVE
RBC UA: NEGATIVE
SPEC GRAV UA: 1.015
UROBILINOGEN UA: NEGATIVE
pH, UA: 6

## 2016-07-02 MED ORDER — SULFAMETHOXAZOLE-TRIMETHOPRIM 800-160 MG PO TABS: 1.0000 | ORAL_TABLET | Freq: Two times a day (BID) | ORAL | Status: DC

## 2016-07-02 MED ORDER — METRONIDAZOLE 500 MG PO TABS: 500.0000 mg | ORAL_TABLET | Freq: Three times a day (TID) | ORAL | Status: DC

## 2016-07-02 NOTE — Progress Notes (Signed)
   Subjective:    Patient ID: [name], female    DOB: Jan 04, 1956, 60 y.o.   MRN: [id number]  HPI She is here for a follow-up visit after recent hospitalization and treatment for colitis which turned out to be C. difficile colitis as well as hematuria with positive culture for Escherichia coli. The hospital record including discharge summary and lab work was reviewed. She was given a cephalosporin for the Escherichia coli and metronidazole. She states that she is somewhat fatigued but having much less diarrhea and started to have formed stools. She's had no abdominal pain, fever, chills, frequency, urgency or dysuria. She also has an underlying history of chronic kidney disease.   Review of Systems     Objective:   Physical Exam Alert and in no distress. Cardiac exam shows regular rhythm without murmurs or gallops. Lungs are clear to auscultation. Abdominal exam shows decreased bowel sounds without masses or tenderness. Urine microscopic did show scattered clumps of white cells.       Assessment & Plan:  C. difficile colitis - Plan: metroNIDAZOLE (FLAGYL) 500 MG tablet  Hematuria - Plan: POCT Urinalysis Dipstick  Chronic kidney disease, stage III (moderate)  Cystitis - Plan: sulfamethoxazole-trimethoprim (BACTRIM DS,SEPTRA DS) 800-160 MG tablet Since she had a 5 day course for treatment of the cystitis and 10 days for C. difficile colitis , I will continue both her another 5 days. She will call me if she continues to have difficulty. She is comfortable with this.

## 2016-07-02 NOTE — Patient Instructions (Addendum)
Take both of the antibiotics until they're all gone and if there is any question about how you're doing call me and we'll get you back in You can eat anything you want

## 2016-07-10 ENCOUNTER — Encounter (HOSPITAL_COMMUNITY): Payer: Self-pay | Admitting: Oncology

## 2016-07-10 ENCOUNTER — Inpatient Hospital Stay (HOSPITAL_COMMUNITY)
Admission: EM | Admit: 2016-07-10 | Discharge: 2016-07-14 | DRG: 641 | Disposition: A | Discharge status: Home / Self Care | Payer: Commercial Managed Care - HMO | Attending: Internal Medicine | Admitting: Internal Medicine

## 2016-07-10 ENCOUNTER — Telehealth: Payer: Self-pay | Admitting: Family Medicine

## 2016-07-10 DIAGNOSIS — R197 Diarrhea, unspecified: Secondary | ICD-10-CM | POA: Diagnosis not present

## 2016-07-10 DIAGNOSIS — R531 Weakness: Secondary | ICD-10-CM | POA: Diagnosis not present

## 2016-07-10 DIAGNOSIS — Z8249 Family history of ischemic heart disease and other diseases of the circulatory system: Secondary | ICD-10-CM

## 2016-07-10 DIAGNOSIS — K529 Noninfective gastroenteritis and colitis, unspecified: Secondary | ICD-10-CM | POA: Diagnosis not present

## 2016-07-10 DIAGNOSIS — J449 Chronic obstructive pulmonary disease, unspecified: Secondary | ICD-10-CM | POA: Diagnosis not present

## 2016-07-10 DIAGNOSIS — I129 Hypertensive chronic kidney disease with stage 1 through stage 4 chronic kidney disease, or unspecified chronic kidney disease: Secondary | ICD-10-CM | POA: Diagnosis present

## 2016-07-10 DIAGNOSIS — D649 Anemia, unspecified: Secondary | ICD-10-CM | POA: Diagnosis not present

## 2016-07-10 DIAGNOSIS — T370X5A Adverse effect of sulfonamides, initial encounter: Secondary | ICD-10-CM | POA: Diagnosis present

## 2016-07-10 DIAGNOSIS — A047 Enterocolitis due to Clostridium difficile: Secondary | ICD-10-CM | POA: Diagnosis not present

## 2016-07-10 DIAGNOSIS — A0472 Enterocolitis due to Clostridium difficile, not specified as recurrent: Secondary | ICD-10-CM | POA: Diagnosis present

## 2016-07-10 DIAGNOSIS — J45909 Unspecified asthma, uncomplicated: Secondary | ICD-10-CM | POA: Diagnosis present

## 2016-07-10 DIAGNOSIS — K219 Gastro-esophageal reflux disease without esophagitis: Secondary | ICD-10-CM | POA: Diagnosis not present

## 2016-07-10 DIAGNOSIS — N184 Chronic kidney disease, stage 4 (severe): Secondary | ICD-10-CM | POA: Diagnosis not present

## 2016-07-10 DIAGNOSIS — K633 Ulcer of intestine: Secondary | ICD-10-CM | POA: Diagnosis not present

## 2016-07-10 DIAGNOSIS — H538 Other visual disturbances: Secondary | ICD-10-CM | POA: Diagnosis not present

## 2016-07-10 DIAGNOSIS — R930 Abnormal findings on diagnostic imaging of skull and head, not elsewhere classified: Secondary | ICD-10-CM | POA: Diagnosis not present

## 2016-07-10 DIAGNOSIS — J439 Emphysema, unspecified: Secondary | ICD-10-CM | POA: Diagnosis not present

## 2016-07-10 DIAGNOSIS — M349 Systemic sclerosis, unspecified: Secondary | ICD-10-CM | POA: Diagnosis present

## 2016-07-10 DIAGNOSIS — E871 Hypo-osmolality and hyponatremia: Secondary | ICD-10-CM | POA: Diagnosis not present

## 2016-07-10 DIAGNOSIS — A09 Infectious gastroenteritis and colitis, unspecified: Secondary | ICD-10-CM | POA: Diagnosis not present

## 2016-07-10 DIAGNOSIS — K51914 Ulcerative colitis, unspecified with abscess: Secondary | ICD-10-CM | POA: Diagnosis not present

## 2016-07-10 NOTE — Telephone Encounter (Signed)
Pt said she was to check in with Dr Susann GivensLalonde if she was not feeling better. Pt think the antiobiotic is making her feel bad but she is on 2 antibiotics so she is not sure which one is affecting her. Pt said she started having a little blurred vision on Wednesday & still has that, has dry mouth, feels weak-wobbly. Pt says she only has about 2 antibiotics left for CDiff & should be on antibiotic for bladder infection until Sunday so she wants to know if she can stop the med for bladder to see if she starts to feel better.

## 2016-07-10 NOTE — Telephone Encounter (Signed)
LMTCB

## 2016-07-10 NOTE — Telephone Encounter (Signed)
Spoke with UnumProvidentShane

## 2016-07-10 NOTE — Telephone Encounter (Signed)
No fever, on disability so does not work but states she "can't do anything and can not walk a straight line" The loose stool will not be there one day and the next day she may have one loose stool. Said she isnt drinking as much as normal but is still drinking plenty water. Experiencing blurred vision, "swimmy headed" dizziness, weak, and having a dry mouth

## 2016-07-10 NOTE — Telephone Encounter (Signed)
Pt will call back if she can come in today or not

## 2016-07-10 NOTE — ED Notes (Signed)
Pt was seen here recently for colitis and UTI.  States that she has not gotten her strength back since that time.  Presents tonight to ensure she is not dehydrated.  Denies any numbness/weakness to one side vs the other.  Neuro exam is normal.  Grips equal b/l.  Denies CP also.

## 2016-07-10 NOTE — Telephone Encounter (Signed)
How much loose stool is she having, how many loose stools a day?  How much water is she drinking?  What all symptoms is she having?   Any fever?  Is she going to work, able to get up and do her normal routine?

## 2016-07-10 NOTE — Telephone Encounter (Signed)
See if she can come in if she is that bad off.

## 2016-07-11 ENCOUNTER — Emergency Department (HOSPITAL_COMMUNITY): Payer: Commercial Managed Care - HMO

## 2016-07-11 ENCOUNTER — Encounter (HOSPITAL_COMMUNITY): Payer: Self-pay | Admitting: Radiology

## 2016-07-11 ENCOUNTER — Inpatient Hospital Stay (HOSPITAL_COMMUNITY): Payer: Commercial Managed Care - HMO

## 2016-07-11 DIAGNOSIS — N184 Chronic kidney disease, stage 4 (severe): Secondary | ICD-10-CM | POA: Diagnosis present

## 2016-07-11 DIAGNOSIS — A047 Enterocolitis due to Clostridium difficile: Secondary | ICD-10-CM

## 2016-07-11 DIAGNOSIS — K529 Noninfective gastroenteritis and colitis, unspecified: Secondary | ICD-10-CM | POA: Diagnosis not present

## 2016-07-11 DIAGNOSIS — T370X5A Adverse effect of sulfonamides, initial encounter: Secondary | ICD-10-CM | POA: Diagnosis present

## 2016-07-11 DIAGNOSIS — A09 Infectious gastroenteritis and colitis, unspecified: Secondary | ICD-10-CM | POA: Diagnosis not present

## 2016-07-11 DIAGNOSIS — Z8249 Family history of ischemic heart disease and other diseases of the circulatory system: Secondary | ICD-10-CM | POA: Diagnosis not present

## 2016-07-11 DIAGNOSIS — K633 Ulcer of intestine: Secondary | ICD-10-CM | POA: Diagnosis not present

## 2016-07-11 DIAGNOSIS — H538 Other visual disturbances: Secondary | ICD-10-CM | POA: Diagnosis present

## 2016-07-11 DIAGNOSIS — K219 Gastro-esophageal reflux disease without esophagitis: Secondary | ICD-10-CM | POA: Diagnosis present

## 2016-07-11 DIAGNOSIS — E871 Hypo-osmolality and hyponatremia: Secondary | ICD-10-CM | POA: Diagnosis present

## 2016-07-11 DIAGNOSIS — M349 Systemic sclerosis, unspecified: Secondary | ICD-10-CM | POA: Diagnosis present

## 2016-07-11 DIAGNOSIS — J45909 Unspecified asthma, uncomplicated: Secondary | ICD-10-CM | POA: Diagnosis present

## 2016-07-11 DIAGNOSIS — A0472 Enterocolitis due to Clostridium difficile, not specified as recurrent: Secondary | ICD-10-CM | POA: Diagnosis present

## 2016-07-11 DIAGNOSIS — D649 Anemia, unspecified: Secondary | ICD-10-CM | POA: Diagnosis present

## 2016-07-11 DIAGNOSIS — J449 Chronic obstructive pulmonary disease, unspecified: Secondary | ICD-10-CM | POA: Diagnosis present

## 2016-07-11 DIAGNOSIS — I129 Hypertensive chronic kidney disease with stage 1 through stage 4 chronic kidney disease, or unspecified chronic kidney disease: Secondary | ICD-10-CM | POA: Diagnosis present

## 2016-07-11 DIAGNOSIS — R197 Diarrhea, unspecified: Secondary | ICD-10-CM | POA: Diagnosis not present

## 2016-07-11 LAB — BASIC METABOLIC PANEL
ANION GAP: 8 (ref 5–15)
ANION GAP: 6 (ref 5–15)
ANION GAP: 7 (ref 5–15)
BUN: 8 mg/dL (ref 6–20)
BUN: 10 mg/dL (ref 6–20)
BUN: 10 mg/dL (ref 6–20)
CHLORIDE: 94 mmol/L — AB (ref 101–111)
CHLORIDE: 99 mmol/L — AB (ref 101–111)
CHLORIDE: 98 mmol/L — AB (ref 101–111)
CO2: 20 mmol/L — ABNORMAL LOW (ref 22–32)
CO2: 21 mmol/L — ABNORMAL LOW (ref 22–32)
CO2: 21 mmol/L — ABNORMAL LOW (ref 22–32)
Calcium: 8.2 mg/dL — ABNORMAL LOW (ref 8.9–10.3)
Calcium: 7.9 mg/dL — ABNORMAL LOW (ref 8.9–10.3)
Calcium: 8 mg/dL — ABNORMAL LOW (ref 8.9–10.3)
Creatinine, Ser: 1.63 mg/dL — ABNORMAL HIGH (ref 0.44–1.00)
Creatinine, Ser: 1.57 mg/dL — ABNORMAL HIGH (ref 0.44–1.00)
Creatinine, Ser: 1.54 mg/dL — ABNORMAL HIGH (ref 0.44–1.00)
GFR calc Af Amer: 41 mL/min — ABNORMAL LOW (ref >60)
GFR calc Af Amer: 42 mL/min — ABNORMAL LOW (ref >60)
GFR, EST AFRICAN AMERICAN: 39 mL/min — AB (ref >60)
GFR, EST NON AFRICAN AMERICAN: 34 mL/min — AB (ref >60)
GFR, EST NON AFRICAN AMERICAN: 35 mL/min — AB (ref >60)
GFR, EST NON AFRICAN AMERICAN: 36 mL/min — AB (ref >60)
GLUCOSE: 87 mg/dL (ref 65–99)
GLUCOSE: 124 mg/dL — AB (ref 65–99)
GLUCOSE: 102 mg/dL — AB (ref 65–99)
POTASSIUM: 4.4 mmol/L (ref 3.5–5.1)
POTASSIUM: 4.3 mmol/L (ref 3.5–5.1)
POTASSIUM: 4.1 mmol/L (ref 3.5–5.1)
SODIUM: 122 mmol/L — AB (ref 135–145)
Sodium: 126 mmol/L — ABNORMAL LOW (ref 135–145)
Sodium: 126 mmol/L — ABNORMAL LOW (ref 135–145)

## 2016-07-11 LAB — CBC WITH DIFFERENTIAL/PLATELET
BASOS PCT: 1 %
Basophils Absolute: 0.1 10*3/uL (ref 0.0–0.1)
EOS ABS: 0.2 10*3/uL (ref 0.0–0.7)
EOS PCT: 3 %
HEMATOCRIT: 29.1 % — AB (ref 36.0–46.0)
HEMOGLOBIN: 10 g/dL — AB (ref 12.0–15.0)
Lymphocytes Relative: 13 %
Lymphs Abs: 1 10*3/uL (ref 0.7–4.0)
MCH: 29.9 pg (ref 26.0–34.0)
MCHC: 34.4 g/dL (ref 30.0–36.0)
MCV: 86.9 fL (ref 78.0–100.0)
MONO ABS: 1 10*3/uL (ref 0.1–1.0)
Monocytes Relative: 12 %
Neutro Abs: 5.8 10*3/uL (ref 1.7–7.7)
Neutrophils Relative %: 71 %
Platelets: 433 10*3/uL — ABNORMAL HIGH (ref 150–400)
RBC: 3.35 MIL/uL — ABNORMAL LOW (ref 3.87–5.11)
RDW: 12.6 % (ref 11.5–15.5)
WBC: 8 10*3/uL (ref 4.0–10.5)

## 2016-07-11 LAB — COMPREHENSIVE METABOLIC PANEL
ALK PHOS: 44 U/L (ref 38–126)
ALT: 16 U/L (ref 14–54)
ANION GAP: 9 (ref 5–15)
AST: 24 U/L (ref 15–41)
Albumin: 3.8 g/dL (ref 3.5–5.0)
BILIRUBIN TOTAL: 0.7 mg/dL (ref 0.3–1.2)
BUN: 10 mg/dL (ref 6–20)
CALCIUM: 8.4 mg/dL — AB (ref 8.9–10.3)
CO2: 20 mmol/L — ABNORMAL LOW (ref 22–32)
Chloride: 86 mmol/L — ABNORMAL LOW (ref 101–111)
Creatinine, Ser: 1.7 mg/dL — ABNORMAL HIGH (ref 0.44–1.00)
GFR calc Af Amer: 37 mL/min — ABNORMAL LOW (ref >60)
GFR, EST NON AFRICAN AMERICAN: 32 mL/min — AB (ref >60)
GLUCOSE: 88 mg/dL (ref 65–99)
POTASSIUM: 4.3 mmol/L (ref 3.5–5.1)
Sodium: 115 mmol/L — CL (ref 135–145)
TOTAL PROTEIN: 7.6 g/dL (ref 6.5–8.1)

## 2016-07-11 LAB — URINALYSIS, ROUTINE W REFLEX MICROSCOPIC
BILIRUBIN URINE: NEGATIVE
Glucose, UA: NEGATIVE mg/dL
HGB URINE DIPSTICK: NEGATIVE
KETONES UR: NEGATIVE mg/dL
Nitrite: NEGATIVE
PH: 5.5 (ref 5.0–8.0)
Protein, ur: NEGATIVE mg/dL
SPECIFIC GRAVITY, URINE: 1.008 (ref 1.005–1.030)

## 2016-07-11 LAB — NA AND K (SODIUM & POTASSIUM), RAND UR
Potassium Urine: 15 mmol/L
Sodium, Ur: 39 mmol/L

## 2016-07-11 LAB — URINE MICROSCOPIC-ADD ON: RBC / HPF: NONE SEEN RBC/hpf (ref 0–5)

## 2016-07-11 LAB — CBG MONITORING, ED: GLUCOSE-CAPILLARY: 96 mg/dL (ref 65–99)

## 2016-07-11 LAB — OSMOLALITY, URINE: OSMOLALITY UR: 206 mosm/kg — AB (ref 300–900)

## 2016-07-11 MED ORDER — ENOXAPARIN SODIUM 30 MG/0.3ML ~~LOC~~ SOLN
30.0000 mg | SUBCUTANEOUS | Status: DC
Start: 2016-07-11 — End: 2016-07-12
  Administered 2016-07-11: 30 mg via SUBCUTANEOUS
  Filled 2016-07-11: qty 0.3

## 2016-07-11 MED ORDER — FLUTICASONE PROPIONATE 50 MCG/ACT NA SUSP
1.0000 | Freq: Every day | NASAL | Status: DC | PRN
  Filled 2016-07-11: qty 16

## 2016-07-11 MED ORDER — LOPERAMIDE HCL 2 MG PO CAPS: 2.0000 mg | ORAL_CAPSULE | Freq: Four times a day (QID) | ORAL | Status: DC | PRN

## 2016-07-11 MED ORDER — LISINOPRIL 5 MG PO TABS
5.0000 mg | ORAL_TABLET | Freq: Every day | ORAL | Status: DC
  Administered 2016-07-11 – 2016-07-13 (×3): 5 mg via ORAL
  Filled 2016-07-11 (×3): qty 1

## 2016-07-11 MED ORDER — CYPROHEPTADINE HCL 4 MG PO TABS
4.0000 mg | ORAL_TABLET | Freq: Two times a day (BID) | ORAL | Status: DC | PRN
  Filled 2016-07-11: qty 1

## 2016-07-11 MED ORDER — MOMETASONE FURO-FORMOTEROL FUM 200-5 MCG/ACT IN AERO
2.0000 | INHALATION_SPRAY | Freq: Two times a day (BID) | RESPIRATORY_TRACT | Status: DC
  Administered 2016-07-11 – 2016-07-14 (×6): 2 via RESPIRATORY_TRACT
  Filled 2016-07-11: qty 8.8

## 2016-07-11 MED ORDER — SODIUM CHLORIDE 0.9 % IV SOLN
INTRAVENOUS | Status: DC
  Administered 2016-07-11: 04:00:00 via INTRAVENOUS

## 2016-07-11 MED ORDER — ALBUTEROL SULFATE (2.5 MG/3ML) 0.083% IN NEBU: 2.5000 mg | INHALATION_SOLUTION | Freq: Four times a day (QID) | RESPIRATORY_TRACT | Status: DC | PRN

## 2016-07-11 MED ORDER — SODIUM CHLORIDE 0.9 % IV BOLUS (SEPSIS)
1000.0000 mL | Freq: Once | INTRAVENOUS | Status: AC
  Administered 2016-07-11: 1000 mL via INTRAVENOUS

## 2016-07-11 MED ORDER — PANTOPRAZOLE SODIUM 40 MG PO TBEC
40.0000 mg | DELAYED_RELEASE_TABLET | Freq: Every day | ORAL | Status: DC
  Administered 2016-07-11 – 2016-07-12 (×2): 40 mg via ORAL
  Filled 2016-07-11 (×2): qty 1

## 2016-07-11 MED ORDER — METRONIDAZOLE 500 MG PO TABS
500.0000 mg | ORAL_TABLET | Freq: Three times a day (TID) | ORAL | Status: DC
  Administered 2016-07-11 – 2016-07-12 (×4): 500 mg via ORAL
  Filled 2016-07-11 (×4): qty 1

## 2016-07-11 MED ORDER — AMLODIPINE BESYLATE 5 MG PO TABS
5.0000 mg | ORAL_TABLET | Freq: Two times a day (BID) | ORAL | Status: DC
  Administered 2016-07-11 – 2016-07-13 (×6): 5 mg via ORAL
  Filled 2016-07-11 (×6): qty 1

## 2016-07-11 MED ORDER — ALBUTEROL SULFATE (2.5 MG/3ML) 0.083% IN NEBU: 3.0000 mL | INHALATION_SOLUTION | Freq: Four times a day (QID) | RESPIRATORY_TRACT | Status: DC | PRN

## 2016-07-11 MED ORDER — SODIUM CHLORIDE 0.9 % IV SOLN
INTRAVENOUS | Status: DC
  Administered 2016-07-11 – 2016-07-14 (×9): via INTRAVENOUS

## 2016-07-11 NOTE — ED Provider Notes (Signed)
CSN: [id number]     Arrival date & time 07/10/16  2135 History  By signing my name below, I, Alyssa GroveMartin Green, attest that this documentation has been prepared under the direction and in the presence of Dierdre ForthHannah Kayler Rise, PA. Electronically Signed: Alyssa GroveMartin Green, ED Scribe. 07/11/2016. 3:40 AM.    Chief Complaint  Patient presents with  . Weakness    The history is provided by the patient and medical records. No language interpreter was used.    HPI Comments: [name] is a 60 y.o. female with PMHx of Asthma, Scleroderma, HTN, and CKD who presents to the Emergency Department complaining of generally weakness bilaterally in her legs. Pt states her legs are generally weak, but she denies having her legs give out on her. Pt was just in the hospital for C-Diff and UTI. She reports she has been compliant with her medications and believes she is getting better.  Pt also reports associated blurry vision, dry mouth, and decreased urinary frequency. Pt states she has 1 episode of diarrhea a day. Pt was given antibiotics and states symptoms have cleared up. She denies shortness of breath, decreased appetite, rhinorrhea, and cough.  Pt reports tick bite 3 weeks ago but denies rash or fevers.  PCP: Vonzell SchlatterLalonde - [facility].    Past Medical History  Diagnosis Date  . Asthma   . Scleroderma (HCC)   . Esophageal stricture   . Hypertension   . Allergic rhinitis   . GERD (gastroesophageal reflux disease)   . CKD (chronic kidney disease), stage IV [facility](HCC)    Past Surgical History  Procedure Laterality Date  . Hand surgery      left; tendon repair   Family History  Problem Relation Age of Onset  . Colon cancer Neg Hx   . Heart failure Mother   . Hepatitis Mother    Social History  Substance Use Topics  . Smoking status: Never Smoker   . Smokeless tobacco: Never Used  . Alcohol Use: No   OB History    No data available     Review of Systems  Constitutional: Negative for fever,  diaphoresis, appetite change, fatigue and unexpected weight change.  HENT: Negative for mouth sores.        Dry mouth  Eyes: Positive for visual disturbance.  Respiratory: Negative for cough, chest tightness, shortness of breath and wheezing.   Cardiovascular: Negative for chest pain.  Gastrointestinal: Positive for diarrhea ( decreased to 1 episode per day). Negative for nausea, vomiting, abdominal pain and constipation.  Endocrine: Negative for polydipsia, polyphagia and polyuria.  Genitourinary: Positive for difficulty urinating (decreased urination). Negative for dysuria, urgency, frequency and hematuria.  Musculoskeletal: Negative for back pain and neck stiffness.  Skin: Negative for rash.  Allergic/Immunologic: Negative for immunocompromised state.  Neurological: Positive for weakness. Negative for syncope, light-headedness and headaches.  Hematological: Does not bruise/bleed easily.  Psychiatric/Behavioral: Negative for sleep disturbance. The patient is not nervous/anxious.   All other systems reviewed and are negative.   Allergies  Avelox; Codeine; and Penicillins  Home Medications   Prior to Admission medications   Medication Sig Start Date End Date Taking? Authorizing Provider  albuterol (PROAIR HFA) 108 (90 BASE) MCG/ACT inhaler Inhale 2 puffs into the lungs every 6 (six) hours as needed for wheezing. 05/29/15 10/21/17 Yes David S Tysinger, PA-C  albuterol (PROVENTIL) (2.5 MG/3ML) 0.083% nebulizer solution Take 3 mLs (2.5 mg total) by nebulization every 6 (six) hours as needed for wheezing or shortness of  breath. 03/20/14  Yes Kermit Balo Tysinger, PA-C  amLODipine (NORVASC) 5 MG tablet Take 5 mg by mouth 2 (two) times daily.  06/05/16  Yes Historical Provider, MD  budesonide-formoterol (SYMBICORT) 160-4.5 MCG/ACT inhaler Inhale 2 puffs into the lungs 2 (two) times daily. 05/19/16  Yes Kermit Balo Tysinger, PA-C  cyproheptadine (PERIACTIN) 4 MG tablet Take 4 mg by mouth 2 (two) times daily  as needed for allergies or rhinitis.    Yes Historical Provider, MD  fluticasone (FLONASE) 50 MCG/ACT nasal spray Place 1 spray into both nostrils daily as needed (for nasal congestion).    Yes Historical Provider, MD  lisinopril (PRINIVIL,ZESTRIL) 5 MG tablet Take 1 tablet (5 mg total) by mouth daily. 06/28/16  Yes Leroy Sea, MD  loperamide (IMODIUM A-D) 2 MG tablet Take 1 tablet (2 mg total) by mouth 4 (four) times daily as needed for diarrhea or loose stools. 06/25/16  Yes Leroy Sea, MD  metroNIDAZOLE (FLAGYL) 500 MG tablet Take 1 tablet (500 mg total) by mouth 3 (three) times daily. 07/02/16  Yes Ronnald Nian, MD  omeprazole (PRILOSEC) 20 MG capsule Take 20 mg by mouth 2 (two) times daily.     Yes Historical Provider, MD  pseudoephedrine (SUDAFED) 30 MG tablet Take 30 mg by mouth every 4 (four) hours as needed for congestion.   Yes Historical Provider, MD  sulfamethoxazole-trimethoprim (BACTRIM DS,SEPTRA DS) 800-160 MG tablet Take 1 tablet by mouth 2 (two) times daily. 07/02/16  Yes Ronnald Nian, MD  cefpodoxime (VANTIN) 200 MG tablet Take 1 tablet (200 mg total) by mouth 2 (two) times daily. Patient not taking: Reported on 07/02/2016 06/25/16   Leroy Sea, MD   BP 105/59 mmHg  Pulse 71  Temp(Src) 98 F (36.7 C) (Oral)  Resp 16  Ht  (1.626 m)  Wt 52.617 kg  BMI 19.90 kg/m2  SpO2 96% Physical Exam  Constitutional: She appears well-developed and well-nourished. No distress.  Awake, alert, nontoxic appearance  HENT:  Head: Normocephalic and atraumatic.  Mouth/Throat: Oropharynx is clear and moist. No oropharyngeal exudate.  Eyes: Conjunctivae are normal. No scleral icterus.  Neck: Normal range of motion. Neck supple.  Cardiovascular: Normal rate, regular rhythm, normal heart sounds and intact distal pulses.   Pulmonary/Chest: Effort normal and breath sounds normal. No respiratory distress. She has no wheezes.  Equal chest expansion  Abdominal: Soft. Bowel sounds are  normal. She exhibits no mass. There is no tenderness. There is no rebound and no guarding.  No abdominal pain to palpation  Musculoskeletal: Normal range of motion. She exhibits no edema.  Neurological: She is alert.  Reflex Scores:      Tricep reflexes are 3+ on the right side and 3+ on the left side.      Bicep reflexes are 3+ on the right side and 3+ on the left side.      Brachioradialis reflexes are 3+ on the left side.      Patellar reflexes are 3+ on the right side and 3+ on the left side.      Achilles reflexes are 3+ on the right side and 3+ on the left side. Speech is clear and goal oriented Moves extremities without ataxia Increased DTRs but otherwise normal neurological Examination Normal Strength in both legs  Skin: Skin is warm and dry. She is not diaphoretic.  Psychiatric: She has a normal mood and affect.  Nursing note and vitals reviewed.   ED Course  Procedures (including critical  care time)  DIAGNOSTIC STUDIES: Oxygen Saturation is 100% on RA, normal by my interpretation.    COORDINATION OF CARE: 1:07 AM Discussed treatment plan with pt at bedside which includes lab work and pt agreed to plan.  Labs Review Labs Reviewed  CBC WITH DIFFERENTIAL/PLATELET - Abnormal; Notable for the following:    RBC 3.35 (*)    Hemoglobin 10.0 (*)    HCT 29.1 (*)    Platelets 433 (*)    All other components within normal limits  COMPREHENSIVE METABOLIC PANEL - Abnormal; Notable for the following:    Sodium 115 (*)    Chloride 86 (*)    CO2 20 (*)    Creatinine, Ser 1.70 (*)    Calcium 8.4 (*)    GFR calc non Af Amer 32 (*)    GFR calc Af Amer 37 (*)    All other components within normal limits  URINALYSIS, ROUTINE W REFLEX MICROSCOPIC (NOT AT [facility]) - Abnormal; Notable for the following:    Leukocytes, UA MODERATE (*)    All other components within normal limits  URINE MICROSCOPIC-ADD ON - Abnormal; Notable for the following:    Squamous Epithelial / LPF 0-5 (*)     Bacteria, UA RARE (*)    All other components within normal limits  ROCKY MTN SPOTTED FVR ABS PNL(IGG+IGM)  B. BURGDORFI ANTIBODIES  EHRLICHIA ANTIBODY PANEL  CBG MONITORING, ED    I have personally reviewed and evaluated these lab results as part of my medical decision-making.   MDM   Final diagnoses:  Hyponatremia  Weakness   Caitlyn Milo presents with Weakness, blurred vision, dry mouth. Patient with significant hyponatremia on labs. Baseline chronic kidney disease and anemia.  Clinically patient hyperreflexive. She will need admission for her hyponatremia. She denies smoking or drinking.  She has had recent tick bite, but no fevers, headache, stiff neck or rash. Doubt RMSF, but pt requests testing.  Patient's medication list cyproheptadine which is an anticholinergic but pt reports she has not taken this medication for weeks.  Doubt anticholinergic toxicity at this time.  She is not hyperthermic, altered or retaining urine.    3:39 AM Discussed with Dr. Julian Reil who requests CXR.  Pt will be admitted.     I personally performed the services described in this documentation, which was scribed in my presence. The recorded information has been reviewed and is accurate.   Dahlia Client Daniyal Tabor, PA-C 07/11/16 1610  Rolan Bucco, MD 07/11/16 [phone]

## 2016-07-11 NOTE — ED Notes (Signed)
Called to give report, RN stated they are unable to take anymore patients until shift change.

## 2016-07-11 NOTE — Progress Notes (Signed)
No charge note.   Pt admitted early this AM for hyponatremia, blurred vision, weakness.  Na improved with NS. Weakness and leg numbness improved. Still with blurry vision.  Plan: - slow fluids to 100 cc/hr - check BMP at noon and then Q8 hours - check Head CT now for blurry vision, note she has no stroke like symptoms otherwise

## 2016-07-11 NOTE — H&P (Signed)
History and Physical    BRYAH OCHELTREE [name]:[id number] DOB: 16-Jun-1956 DOA: 07/10/2016   PCP: Carollee Herter, MD Chief Complaint:  Chief Complaint  Patient presents with  . Weakness    HPI: [name] is a 60 y.o. female with medical history significant of recent C.Diff infection on flagyl and UTI on bactrim.  Patient presents to the ED with c/o generalized weakness in BLE.  Diarrhea has actually been getting better, she has been taking meds as instructed.  Weakness in legs seemed to onset right after PCP started her on bactrim for UTI.  Symptoms of weakness worsening.  Blurry vision, dry mouth, decreased UOP also she states.  ED Course: Sodium 115 down from 135 last admit!  Review of Systems: As per HPI otherwise 10 point review of systems negative.    Past Medical History  Diagnosis Date  . Asthma   . Scleroderma (HCC)   . Esophageal stricture   . Hypertension   . Allergic rhinitis   . GERD (gastroesophageal reflux disease)   . CKD (chronic kidney disease), stage IV [facility])     Past Surgical History  Procedure Laterality Date  . Hand surgery      left; tendon repair     reports that she has never smoked. She has never used smokeless tobacco. She reports that she does not drink alcohol or use illicit drugs.  Allergies  Allergen Reactions  . Avelox [Moxifloxacin Hcl In Nacl]     Caused tachycardia  . Codeine Hives and Other (See Comments)    hallucination  . Penicillins Hives    Has patient had a PCN reaction causing immediate rash, facial/tongue/throat swelling, SOB or lightheadedness with hypotension:NO Has patient had a PCN reaction causing severe rash involving mucus membranes or skin necrosis:UNSURE Has patient had a PCN reaction that required hospitalization:No Has patient had a PCN reaction occurring within the last 10 years:No If all of the above answers are "NO", then may proceed with Cephalosporin use.     Family History  Problem Relation Age  of Onset  . Colon cancer Neg Hx   . Heart failure Mother   . Hepatitis Mother      Prior to Admission medications   Medication Sig Start Date End Date Taking? Authorizing Provider  albuterol (PROAIR HFA) 108 (90 BASE) MCG/ACT inhaler Inhale 2 puffs into the lungs every 6 (six) hours as needed for wheezing. 05/29/15 10/21/17 Yes David S Tysinger, PA-C  albuterol (PROVENTIL) (2.5 MG/3ML) 0.083% nebulizer solution Take 3 mLs (2.5 mg total) by nebulization every 6 (six) hours as needed for wheezing or shortness of breath. 03/20/14  Yes Kermit Balo Tysinger, PA-C  amLODipine (NORVASC) 5 MG tablet Take 5 mg by mouth 2 (two) times daily.  06/05/16  Yes Historical Provider, MD  budesonide-formoterol (SYMBICORT) 160-4.5 MCG/ACT inhaler Inhale 2 puffs into the lungs 2 (two) times daily. 05/19/16  Yes Kermit Balo Tysinger, PA-C  cyproheptadine (PERIACTIN) 4 MG tablet Take 4 mg by mouth 2 (two) times daily as needed for allergies or rhinitis.    Yes Historical Provider, MD  fluticasone (FLONASE) 50 MCG/ACT nasal spray Place 1 spray into both nostrils daily as needed (for nasal congestion).    Yes Historical Provider, MD  lisinopril (PRINIVIL,ZESTRIL) 5 MG tablet Take 1 tablet (5 mg total) by mouth daily. 06/28/16  Yes Leroy Sea, MD  loperamide (IMODIUM A-D) 2 MG tablet Take 1 tablet (2 mg total) by mouth 4 (four) times daily as needed for diarrhea  or loose stools. 06/25/16  Yes Leroy SeaPrashant K Singh, MD  metroNIDAZOLE (FLAGYL) 500 MG tablet Take 1 tablet (500 mg total) by mouth 3 (three) times daily. 07/02/16  Yes Ronnald NianJohn C Lalonde, MD  omeprazole (PRILOSEC) 20 MG capsule Take 20 mg by mouth 2 (two) times daily.     Yes Historical Provider, MD  pseudoephedrine (SUDAFED) 30 MG tablet Take 30 mg by mouth every 4 (four) hours as needed for congestion.   Yes Historical Provider, MD    Physical Exam: Filed Vitals:   07/10/16 2158 07/11/16 0056 07/11/16 0411  BP: 106/58 105/59 107/52  Pulse: 82 71 77  Temp: 98 F (36.7 C)      TempSrc: Oral    Resp: 14 16 16   Height: 5\' 4"  (1.626 m)    Weight: 52.617 kg (116 lb)    SpO2: 100% 96% 99%      Constitutional: NAD, calm, comfortable Eyes: PERRL, lids and conjunctivae normal ENMT: Mucous membranes are moist. Posterior pharynx clear of any exudate or lesions.Normal dentition.  Neck: normal, supple, no masses, no thyromegaly Respiratory: clear to auscultation bilaterally, no wheezing, no crackles. Normal respiratory effort. No accessory muscle use.  Cardiovascular: Regular rate and rhythm, no murmurs / rubs / gallops. No extremity edema. 2+ pedal pulses. No carotid bruits.  Abdomen: no tenderness, no masses palpated. No hepatosplenomegaly. Bowel sounds positive.  Musculoskeletal: no clubbing / cyanosis. No joint deformity upper and lower extremities. Good ROM, no contractures. Normal muscle tone.  Skin: no rashes, lesions, ulcers. No induration Neurologic: CN 2-12 grossly intact. Sensation intact, DTR normal. Strength 5/5 in all 4.  Psychiatric: Normal judgment and insight. Alert and oriented x 3. Normal mood.    Labs on Admission: I have personally reviewed following labs and imaging studies  CBC:  Recent Labs Lab 07/11/16 0146  WBC 8.0  NEUTROABS 5.8  HGB 10.0*  HCT 29.1*  MCV 86.9  PLT 433*   Basic Metabolic Panel:  Recent Labs Lab 07/11/16 0146  NA 115*  K 4.3  CL 86*  CO2 20*  GLUCOSE 88  BUN 10  CREATININE 1.70*  CALCIUM 8.4*   GFR: Estimated Creatinine Clearance: 30 mL/min (by C-G formula based on Cr of 1.7). Liver Function Tests:  Recent Labs Lab 07/11/16 0146  AST 24  ALT 16  ALKPHOS 44  BILITOT 0.7  PROT 7.6  ALBUMIN 3.8   No results for input(s): LIPASE, AMYLASE in the last 168 hours. No results for input(s): AMMONIA in the last 168 hours. Coagulation Profile: No results for input(s): INR, PROTIME in the last 168 hours. Cardiac Enzymes: No results for input(s): CKTOTAL, CKMB, CKMBINDEX, TROPONINI in the last 168  hours. BNP (last 3 results) No results for input(s): PROBNP in the last 8760 hours. HbA1C: No results for input(s): HGBA1C in the last 72 hours. CBG:  Recent Labs Lab 07/11/16 0135  GLUCAP 96   Lipid Profile: No results for input(s): CHOL, HDL, LDLCALC, TRIG, CHOLHDL, LDLDIRECT in the last 72 hours. Thyroid Function Tests: No results for input(s): TSH, T4TOTAL, FREET4, T3FREE, THYROIDAB in the last 72 hours. Anemia Panel: No results for input(s): VITAMINB12, FOLATE, FERRITIN, TIBC, IRON, RETICCTPCT in the last 72 hours. Urine analysis:    Component Value Date/Time   COLORURINE YELLOW 07/11/2016 0211   APPEARANCEUR CLEAR 07/11/2016 0211   LABSPEC 1.008 07/11/2016 0211   PHURINE 5.5 07/11/2016 0211   GLUCOSEU NEGATIVE 07/11/2016 0211   GLUCOSEU Negative 02/03/2010 0947   HGBUR NEGATIVE 07/11/2016 0211  BILIRUBINUR NEGATIVE 07/11/2016 0211   BILIRUBINUR n 07/02/2016 1026   KETONESUR NEGATIVE 07/11/2016 0211   PROTEINUR NEGATIVE 07/11/2016 0211   PROTEINUR n 07/02/2016 1026   UROBILINOGEN negative 07/02/2016 1026   UROBILINOGEN 0.2 02/08/2010 0744   NITRITE NEGATIVE 07/11/2016 0211   NITRITE n 07/02/2016 1026   LEUKOCYTESUR MODERATE* 07/11/2016 0211   Sepsis Labs: (procalcitonin:4,lacticidven:4) )No results found for this or any previous visit (from the past 240 hour(s)).   Radiological Exams on Admission: Dg Chest 2 View  07/11/2016  CLINICAL DATA:  Bilateral leg weakness. Recent hospitalization for urinary tract infection and C difficile. EXAM: CHEST  2 VIEW COMPARISON:  01/13/2010 FINDINGS: Emphysematous changes in the lungs. Peripheral and basilar fibrosis in both lungs. No focal consolidation or airspace disease. No blunting of costophrenic angles. No pneumothorax. Heart size and pulmonary vascularity are normal. Calcification over the right shoulder suggesting calcified lymph nodes or synovial osteochondromatosis. IMPRESSION: Emphysematous changes and  fibrosis in the lungs. No evidence of active consolidation. Electronically Signed   By: Burman Nieves [name]   On: 07/11/2016 04:18    EKG: Independently reviewed.  Assessment/Plan Principal Problem:   Hyponatremia with decreased serum osmolality Active Problems:   Clostridium difficile diarrhea    Severe hyponatremia -  Most likely due to bactrim side given the HPI above  Patient re-confirms that she is a never smoker, CXR shows no evidence of mass or nodules (much less widespread metastatic SCLC).  Will check urine sodium and osm for SIADH.  Urine specific gravity is noted to be appropriately low at 1.008.  Got 1L bolus in ED, will continue to treat with NS.  If urine sodium is elevated then will need to stop NS and instead fluid restrict the patient.  C.Diff - continue flagyl  Completed treatment for recent UTI - still with WBC and moderate leukocytes.  Repeating urine culture as well.   DVT prophylaxis: Lovenox Code Status: Full Family Communication: No family in room Consults called: None Admission status: Admit to inpatient   Hillary Bow DO [facility] Pager [phone] from 7PM-7AM  If 7AM-7PM, please contact the day physician for the patient [email or web address] Password [facility]  07/11/2016, 4:58 AM

## 2016-07-11 NOTE — Progress Notes (Signed)
Patient provided information regarding enteric precautions and Cdiff.  Questions answered.  Patient able to return education material through teach back

## 2016-07-12 DIAGNOSIS — A09 Infectious gastroenteritis and colitis, unspecified: Secondary | ICD-10-CM

## 2016-07-12 DIAGNOSIS — E871 Hypo-osmolality and hyponatremia: Principal | ICD-10-CM

## 2016-07-12 LAB — BASIC METABOLIC PANEL
ANION GAP: 6 (ref 5–15)
BUN: 9 mg/dL (ref 6–20)
CO2: 21 mmol/L — AB (ref 22–32)
Calcium: 8 mg/dL — ABNORMAL LOW (ref 8.9–10.3)
Chloride: 104 mmol/L (ref 101–111)
Creatinine, Ser: 1.37 mg/dL — ABNORMAL HIGH (ref 0.44–1.00)
GFR, EST AFRICAN AMERICAN: 48 mL/min — AB (ref >60)
GFR, EST NON AFRICAN AMERICAN: 42 mL/min — AB (ref >60)
GLUCOSE: 79 mg/dL (ref 65–99)
POTASSIUM: 4.1 mmol/L (ref 3.5–5.1)
Sodium: 131 mmol/L — ABNORMAL LOW (ref 135–145)

## 2016-07-12 LAB — C DIFFICILE QUICK SCREEN W PCR REFLEX
C DIFFICILE (CDIFF) INTERP: NOT DETECTED
C Diff antigen: NEGATIVE
C Diff toxin: NEGATIVE

## 2016-07-12 MED ORDER — PANTOPRAZOLE SODIUM 40 MG PO TBEC
40.0000 mg | DELAYED_RELEASE_TABLET | Freq: Two times a day (BID) | ORAL | Status: DC
  Administered 2016-07-12 – 2016-07-13 (×3): 40 mg via ORAL
  Filled 2016-07-12 (×3): qty 1

## 2016-07-12 MED ORDER — PANTOPRAZOLE SODIUM 40 MG PO TBEC: 40.0000 mg | DELAYED_RELEASE_TABLET | Freq: Two times a day (BID) | ORAL | Status: DC

## 2016-07-12 MED ORDER — ENOXAPARIN SODIUM 40 MG/0.4ML ~~LOC~~ SOLN
40.0000 mg | SUBCUTANEOUS | Status: DC
Start: 2016-07-12 — End: 2016-07-13
  Administered 2016-07-12: 40 mg via SUBCUTANEOUS
  Filled 2016-07-12: qty 0.4

## 2016-07-12 NOTE — Discharge Summary (Addendum)
Discharge Summary  [name] ZOX:[id number] DOB: 10-25-1956  PCP: Wyatt Haste, MD  Admit date: 07/10/2016 Discharge date: 07/12/2016   PM addendum: pt continues to have lots of watery stools. I reviewed hospital visit from June, C diff was negative. She has had 2 empiric courses of Flagyl without resolution of diarrhea. Cancel discharge today and Consulted GI. Patient agrees with this plan.    Recommendations for Outpatient Follow-up:  1. PCP in 1 week.   Discharge Diagnoses:  Active Hospital Problems   Diagnosis Date Noted  . Hyponatremia with decreased serum osmolality 07/11/2016  . Clostridium difficile diarrhea 07/11/2016  . Hyponatremia 07/11/2016    Resolved Hospital Problems   Diagnosis Date Noted Date Resolved  No resolved problems to display.    Discharge Condition: Stable   Diet recommendation: Regular   Filed Vitals:   07/12/16 0604 07/12/16 0920  BP: 92/59   Pulse: 66 67  Temp: 98.4 F (36.9 C)   Resp: 15 17    History of present illness:  [name] is a 60 y.o. female with medical history significant of recent C.Diff infection on flagyl and UTI on bactrim. Patient presents to the ED with c/o generalized weakness in BLE. Found to have low sodium 115 with lower extremity tinging and blurry vision.   Hospital Course:  Principal Problem:   Hyponatremia with decreased serum osmolality Active Problems:   Clostridium difficile diarrhea   Hyponatremia  She was admitted to the hospital and started on IV fluids, her antibiotics for Cdiff were continued. She had some diarrhea but it has been under control. She had CT head without contrast which ruled out acute abnormality that would explain her blurry vision. As her Na improved, her vision improved, her LE complaints went away and she is able to ambulate without difficulty. She feels well and feels ready to discharge from the hospital today.   She has made a rapid recovery and met criteria  for hospital discharge more quickly than anticipated.  Procedures:  None   Consultations:  None   Discharge Exam: BP 92/59 mmHg  Pulse 67  Temp(Src) 98.4 F (36.9 C) (Oral)  Resp 17  Ht '5\' 4"'  (1.626 m)  Wt 52.617 kg (116 lb)  BMI 19.90 kg/m2  SpO2 98% General:  Alert, oriented, calm, in no acute distress  Eyes: pupils round and reactive to light and accomodation, clear sclerea Neck: supple, no masses, trachea mildline  Cardiovascular: RRR, no murmurs or rubs, no peripheral edema  Respiratory: clear to auscultation bilaterally, no wheezes, no crackles  Abdomen: soft, nontender, nondistended, normal bowel tones heard  Skin: dry, no rashes  Musculoskeletal: no joint effusions, normal range of motion  Psychiatric: appropriate affect, normal speech  Neurologic: extraocular muscles intact, clear speech, moving all extremities with intact sensorium    Discharge Instructions You were cared for by a hospitalist during your hospital stay. If you have any questions about your discharge medications or the care you received while you were in the hospital after you are discharged, you can call the unit and asked to speak with the hospitalist on call if the hospitalist that took care of you is not available. Once you are discharged, your primary care physician will handle any further medical issues. Please note that NO REFILLS for any discharge medications will be authorized once you are discharged, as it is imperative that you return to your primary care physician (or establish a relationship with a primary care physician if you do not  have one) for your aftercare needs so that they can reassess your need for medications and monitor your lab values.  Discharge Instructions    Diet - low sodium heart healthy    Complete by:  As directed      Increase activity slowly    Complete by:  As directed             Medication List    TAKE these medications        albuterol (2.5 MG/3ML) 0.083%  nebulizer solution  Commonly known as:  PROVENTIL  Take 3 mLs (2.5 mg total) by nebulization every 6 (six) hours as needed for wheezing or shortness of breath.     albuterol 108 (90 Base) MCG/ACT inhaler  Commonly known as:  PROAIR HFA  Inhale 2 puffs into the lungs every 6 (six) hours as needed for wheezing.     amLODipine 5 MG tablet  Commonly known as:  NORVASC  Take 5 mg by mouth 2 (two) times daily.     budesonide-formoterol 160-4.5 MCG/ACT inhaler  Commonly known as:  SYMBICORT  Inhale 2 puffs into the lungs 2 (two) times daily.     cyproheptadine 4 MG tablet  Commonly known as:  PERIACTIN  Take 4 mg by mouth 2 (two) times daily as needed for allergies or rhinitis.     fluticasone 50 MCG/ACT nasal spray  Commonly known as:  FLONASE  Place 1 spray into both nostrils daily as needed (for nasal congestion).     lisinopril 5 MG tablet  Commonly known as:  PRINIVIL,ZESTRIL  Take 1 tablet (5 mg total) by mouth daily.     loperamide 2 MG tablet  Commonly known as:  IMODIUM A-D  Take 1 tablet (2 mg total) by mouth 4 (four) times daily as needed for diarrhea or loose stools.     metroNIDAZOLE 500 MG tablet  Commonly known as:  FLAGYL  Take 1 tablet (500 mg total) by mouth 3 (three) times daily.     omeprazole 20 MG capsule  Commonly known as:  PRILOSEC  Take 20 mg by mouth 2 (two) times daily.     pseudoephedrine 30 MG tablet  Commonly known as:  SUDAFED  Take 30 mg by mouth every 4 (four) hours as needed for congestion.       Allergies  Allergen Reactions  . Avelox [Moxifloxacin Hcl In Nacl]     Caused tachycardia  . Codeine Hives and Other (See Comments)    hallucination  . Penicillins Hives    Has patient had a PCN reaction causing immediate rash, facial/tongue/throat swelling, SOB or lightheadedness with hypotension:NO Has patient had a PCN reaction causing severe rash involving mucus membranes or skin necrosis:UNSURE Has patient had a PCN reaction that  required hospitalization:No Has patient had a PCN reaction occurring within the last 10 years:No If all of the above answers are "NO", then may proceed with Cephalosporin use.       The results of significant diagnostics from this hospitalization (including imaging, microbiology, ancillary and laboratory) are listed below for reference.    Significant Diagnostic Studies: Ct Abdomen Pelvis Wo Contrast  06/24/2016  CLINICAL DATA:  60 year old female with diffuse abdominal pain EXAM: CT ABDOMEN AND PELVIS WITHOUT CONTRAST TECHNIQUE: Multidetector CT imaging of the abdomen and pelvis was performed following the standard protocol without IV contrast. COMPARISON:  None. FINDINGS: Evaluation of this exam is limited in the absence of intravenous contrast. Bibasilar linear atelectasis/scarring. No intra-abdominal free air. No  free fluid. The liver, gallbladder, pancreas, spleen, adrenal glands appear unremarkable. Punctate nonobstructing right renal interpolar calculus. No hydronephrosis. The left kidney is unremarkable. The visualized ureters and urinary bladder appear unremarkable. The uterus, and ovaries are grossly unremarkable. There is segmental thickening and inflammatory changes of the colon primarily involving the transverse colon and the splenic flexure compatible with colitis. This is likely infectious in etiology. An inflammatory bowel disease or ischemia are less likely but not excluded. Loose stool noted within the colon compatible with diarrheal state. Correlation with clinical exam and stool cultures recommended. There is no pneumatosis. There is also mild apparent thickening of the gastric rugal folds as well as jejunal folds which may be related to underdistention or represent gastroenteritis. Clinical correlation is recommended. Small hiatal hernia. Contrast noted in the distal esophagus likely related to gastroesophageal reflux. There is no evidence of bowel obstruction. Normal appendix. There  is mild to moderate aortoiliac atherosclerotic disease. Evaluation of the aorta and IVC is limited in the absence of intravenous contrast. No portal venous gas identified. There is no adenopathy. The abdominal wall soft tissues appear unremarkable. There is osteopenia with mild degenerative changes of the spine. No acute fracture. IMPRESSION: Colitis primarily involving the transverse and descending colon, likely infectious in etiology. Correlation with clinical exam and stool cultures recommended. Diarrheal state. Small hiatal hernia and gastroesophageal reflux. No bowel obstruction. Normal Appendix. Electronically Signed   By: Anner Crete [name]   On: 06/24/2016 03:03   Dg Chest 2 View  07/11/2016  CLINICAL DATA:  Bilateral leg weakness. Recent hospitalization for urinary tract infection and C difficile. EXAM: CHEST  2 VIEW COMPARISON:  01/13/2010 FINDINGS: Emphysematous changes in the lungs. Peripheral and basilar fibrosis in both lungs. No focal consolidation or airspace disease. No blunting of costophrenic angles. No pneumothorax. Heart size and pulmonary vascularity are normal. Calcification over the right shoulder suggesting calcified lymph nodes or synovial osteochondromatosis. IMPRESSION: Emphysematous changes and fibrosis in the lungs. No evidence of active consolidation. Electronically Signed   By: Lucienne Capers [name]   On: 07/11/2016 04:18   Ct Head Wo Contrast  07/11/2016  CLINICAL DATA:  Generalized weakness. EXAM: CT HEAD WITHOUT CONTRAST TECHNIQUE: Contiguous axial images were obtained from the base of the skull through the vertex without intravenous contrast. COMPARISON:  None. FINDINGS: There is no evidence for acute hemorrhage, hydrocephalus, mass lesion, or abnormal extra-axial fluid collection. No definite CT evidence for acute infarction. Frontal and sphenoid sinuses are clear. Ethmoid air cells are clear appeared fluid is identified in the mastoid air cells bilaterally. IMPRESSION:  1. No acute intracranial abnormality. 2. Bilateral mastoid effusions. Electronically Signed   By: Misty Stanley [name]   On: 07/11/2016 15:13    Microbiology: No results found for this or any previous visit (from the past 240 hour(s)).   Labs: Basic Metabolic Panel:  Recent Labs Lab 07/11/16 0146 07/11/16 0805 07/11/16 1428 07/11/16 1956 07/12/16 0349  NA 115* 122* 126* 126* 131*  K 4.3 4.4 4.3 4.1 4.1  CL 86* 94* 99* 98* 104  CO2 20* 20* 21* 21* 21*  GLUCOSE 88 87 124* 102* 79  BUN '10 8 10 10 9  ' CREATININE 1.70* 1.63* 1.57* 1.54* 1.37*  CALCIUM 8.4* 8.2* 7.9* 8.0* 8.0*   Liver Function Tests:  Recent Labs Lab 07/11/16 0146  AST 24  ALT 16  ALKPHOS 44  BILITOT 0.7  PROT 7.6  ALBUMIN 3.8   No results for input(s): LIPASE, AMYLASE in the last  168 hours. No results for input(s): AMMONIA in the last 168 hours. CBC:  Recent Labs Lab 07/11/16 0146  WBC 8.0  NEUTROABS 5.8  HGB 10.0*  HCT 29.1*  MCV 86.9  PLT 433*   Cardiac Enzymes: No results for input(s): CKTOTAL, CKMB, CKMBINDEX, TROPONINI in the last 168 hours. BNP: BNP (last 3 results) No results for input(s): BNP in the last 8760 hours.  ProBNP (last 3 results) No results for input(s): PROBNP in the last 8760 hours.  CBG:  Recent Labs Lab 07/11/16 0135  GLUCAP 96    Time spent: 31 minutes were spent in preparing this discharge including medication reconciliation, counseling, and coordination of care.  Signed:  [employer]  [facility] 07/12/2016, 10:01 AM

## 2016-07-12 NOTE — Consult Note (Signed)
[facility] Gastroenterology Consult Note   History CAMYRA VAETH MRN # [id number]  Date of Admission: 07/10/2016 Date of Consultation: 07/12/2016 Referring physician: Dr. Colon Branch,*  Reason for Consultation/Chief Complaint: Diarrhea  Subjective HPI:  This is a 60 year old woman last seen by Dr. Yancey Flemings of our practice for an upper endoscopy with dilation in 2012. Consulted by the hospitalist physician due to ongoing diarrhea. The patient denies chronic digestive symptoms until about 3 weeks ago, when she was admitted with acute onset severe crampy lower abdominal pain, protracted bilious vomiting and diarrhea and marked leukocytosis. CT scan of the abdomen and pelvis with contrast showed thickening of the transverse colon and proximal descending, Consistent with acute colitis. Stool studies including C. difficile were negative, it was felt still likely to have been infectious, she was treated empirically with Flagyl. She reports that she never really felt much better until about this last week, when she would have one day feeling well, and then another day of watery diarrhea. She seems to have a BM just about every time she urinates every few hours. Stool is loose and watery, nonbloody. She no longer has abdominal pain. She was admitted 2 days ago with some generalized weakness, was found to have hyponatremia with a sodium of 115. Urine sodium was elevated, though she was placed on water restriction in her sodium has risen to 131. She denies fever chest pain dyspnea or dysuria. Her appetite is good and there is no recent weight loss.  ROS:  All other systems are negative except as noted above in the HPI  Past Medical History Past Medical History  Diagnosis Date  . Asthma   . Scleroderma (HCC)   . Esophageal stricture   . Hypertension   . Allergic rhinitis   . GERD (gastroesophageal reflux disease)   . CKD (chronic kidney disease), stage IV [facility])     Past Surgical  History Past Surgical History  Procedure Laterality Date  . Hand surgery      left; tendon repair    Family History Family History  Problem Relation Age of Onset  . Colon cancer Neg Hx   . Heart failure Mother   . Hepatitis Mother     Social History Social History   Social History  . Marital Status: Single    Spouse Name: N/A  . Number of Children: N/A  . Years of Education: N/A   Social History Main Topics  . Smoking status: Never Smoker   . Smokeless tobacco: Never Used  . Alcohol Use: No  . Drug Use: No  . Sexual Activity: Not Asked   Other Topics Concern  . None   Social History Narrative    Allergies Allergies  Allergen Reactions  . Avelox [Moxifloxacin Hcl In Nacl]     Caused tachycardia  . Codeine Hives and Other (See Comments)    hallucination  . Penicillins Hives    Has patient had a PCN reaction causing immediate rash, facial/tongue/throat swelling, SOB or lightheadedness with hypotension:NO Has patient had a PCN reaction causing severe rash involving mucus membranes or skin necrosis:UNSURE Has patient had a PCN reaction that required hospitalization:No Has patient had a PCN reaction occurring within the last 10 years:No If all of the above answers are "NO", then may proceed with Cephalosporin use.     Outpatient Meds Home medications from the H+P and/or nursing med reconciliation reviewed.  Inpatient med list reviewed  _____________________________________________________________________ Objective  Exam:  Current vital signs  Patient Vitals  for the past 8 hrs:  BP Pulse Resp SpO2  07/12/16 1016 100/64 mmHg - - -  07/12/16 0920 - 67 17 98 %    Intake/Output Summary (Last 24 hours) at 07/12/16 1412 Last data filed at 07/12/16 0807  Gross per 24 hour  Intake   1680 ml  Output    600 ml  Net   1080 ml    Physical Exam:    General: this is a Well-appearing female patient in no acute distress. She has a facial appearance of  scleroderma.  Eyes: sclera anicteric, no redness  ENT: oral mucosa moist without lesions, no cervical or supraclavicular lymphadenopathy, good dentition  CV: RRR without murmur, S1/S2, no JVD,, no peripheral edema  Resp: clear to auscultation bilaterally, normal RR and effort noted  GI: soft, no tenderness, with active bowel sounds. No guarding or palpable organomegaly noted  Skin; warm and dry, no rash or jaundice noted  Neuro: awake, alert and oriented x 3. Normal gross motor function and fluent speech.  Labs: Stool negative for C. difficile toxin and antigen on 06/24/2016  Recent Labs Lab 07/11/16 0146  WBC 8.0  HGB 10.0*  HCT 29.1*  PLT 433*    Recent Labs Lab 07/11/16 0146  07/12/16 0349  NA 115*  < > 131*  K 4.3  < > 4.1  CL 86*  < > 104  CO2 20*  < > 21*  BUN 10  < > 9  ALBUMIN 3.8  --   --   ALKPHOS 44  --   --   ALT 16  --   --   AST 24  --   --   GLUCOSE 88  < > 79  < > = values in this interval not displayed. No results for input(s): INR in the last 168 hours.  Radiologic studies:CT abdomen and pelvis from 06/24/2016 was personally reviewed.   @ASSESSMENTPLANBEGIN @ Impression:  3 weeks of chronic diarrhea. It appears to have been infectious at first, but unclear why it is persistent. It may just be dysbiosis, there is no pain to suggest postinfectious irritable bowel. The initial clinical presentation is not consistent with small bowel bacterial overgrowth, despite the risk factor of scleroderma. Perhaps she developed a microscopic colitis as a response to this initial infection. I advised her to have a colonoscopy, but it needs to wait until the day after tomorrow because she had solid food today. I also do not think it is wise to do it as an outpatient given her hyponatremia.  Plan:  Colonoscopy the day after tomorrow. Clear liquid diet will start tomorrow morning and a bowel preparation order to follow when she is seen by the PA tomorrow. After  Christella HartiganJacobs will assume consultative care tomorrow.    Thank you for the courtesy of this consult.  Please contact me with any questions or concerns.  Charlie PitterHenry L Danis III Pager: [phone] Mon-Fri 8a-5p [phone] after 5p, weekends, holidays  CC: [facility]

## 2016-07-13 DIAGNOSIS — R197 Diarrhea, unspecified: Secondary | ICD-10-CM

## 2016-07-13 LAB — BASIC METABOLIC PANEL
ANION GAP: 6 (ref 5–15)
BUN: 12 mg/dL (ref 6–20)
CO2: 22 mmol/L (ref 22–32)
Calcium: 8 mg/dL — ABNORMAL LOW (ref 8.9–10.3)
Chloride: 104 mmol/L (ref 101–111)
Creatinine, Ser: 1.52 mg/dL — ABNORMAL HIGH (ref 0.44–1.00)
GFR, EST AFRICAN AMERICAN: 43 mL/min — AB (ref >60)
GFR, EST NON AFRICAN AMERICAN: 37 mL/min — AB (ref >60)
GLUCOSE: 101 mg/dL — AB (ref 65–99)
POTASSIUM: 3.6 mmol/L (ref 3.5–5.1)
Sodium: 132 mmol/L — ABNORMAL LOW (ref 135–145)

## 2016-07-13 LAB — ROCKY MTN SPOTTED FVR ABS PNL(IGG+IGM)
RMSF IGM: 0.24 {index} (ref 0.00–0.89)
RMSF IgG: NEGATIVE

## 2016-07-13 LAB — URINE CULTURE

## 2016-07-13 LAB — B. BURGDORFI ANTIBODIES

## 2016-07-13 MED ORDER — PEG-KCL-NACL-NASULF-NA ASC-C 100 G PO SOLR
0.5000 | Freq: Once | ORAL | Status: AC
  Administered 2016-07-13: 100 g via ORAL
  Filled 2016-07-13: qty 1

## 2016-07-13 MED ORDER — PEG-KCL-NACL-NASULF-NA ASC-C 100 G PO SOLR
0.5000 | Freq: Once | ORAL | Status: AC
  Administered 2016-07-14: 100 g via ORAL

## 2016-07-13 MED ORDER — SODIUM CHLORIDE 0.9 % IV SOLN
INTRAVENOUS | Status: DC
  Administered 2016-07-14: 10:00:00 via INTRAVENOUS

## 2016-07-13 MED ORDER — ENOXAPARIN SODIUM 40 MG/0.4ML ~~LOC~~ SOLN
40.0000 mg | SUBCUTANEOUS | Status: AC
  Administered 2016-07-13: 40 mg via SUBCUTANEOUS
  Filled 2016-07-13: qty 0.4

## 2016-07-13 MED ORDER — PEG-KCL-NACL-NASULF-NA ASC-C 100 G PO SOLR: 1.0000 | Freq: Once | ORAL | Status: DC

## 2016-07-13 NOTE — Progress Notes (Signed)
[facility] Progress Note    Since last GI note: Diarrhea improving past 24 hours (none in 3 trips to BR to urinate).  No abd pains, no overt bleeding.  Objective: Vital signs in last 24 hours: Temp:  [98.1 F (36.7 C)-98.4 F (36.9 C)] 98.4 F (36.9 C) (07/17 0419) Pulse Rate:  [76-79] 78 (07/17 0419) Resp:  [16-17] 16 (07/17 0419) BP: (95-100)/(58-69) 95/58 mmHg (07/17 0419) SpO2:  [98 %-100 %] 98 % (07/17 0734) Last BM Date: 07/12/16 General: alert and oriented times 3 Heart: regular rate and rythm Abdomen: soft, non-tender, non-distended, normal bowel sounds   Lab Results:  Recent Labs  07/11/16 0146  WBC 8.0  HGB 10.0*  PLT 433*  MCV 86.9    Recent Labs  07/11/16 1956 07/12/16 0349 07/13/16 0912  NA 126* 131* 132*  K 4.1 4.1 3.6  CL 98* 104 104  CO2 21* 21* 22  GLUCOSE 102* 79 101*  BUN 10 9 12   CREATININE 1.54* 1.37* 1.52*  CALCIUM 8.0* 8.0* 8.0*    Recent Labs  07/11/16 0146  PROT 7.6  ALBUMIN 3.8  AST 24  ALT 16  ALKPHOS 44  BILITOT 0.7    Studies/Results: Ct Head Wo Contrast  07/11/2016  CLINICAL DATA:  Generalized weakness. EXAM: CT HEAD WITHOUT CONTRAST TECHNIQUE: Contiguous axial images were obtained from the base of the skull through the vertex without intravenous contrast. COMPARISON:  None. FINDINGS: There is no evidence for acute hemorrhage, hydrocephalus, mass lesion, or abnormal extra-axial fluid collection. No definite CT evidence for acute infarction. Frontal and sphenoid sinuses are clear. Ethmoid air cells are clear appeared fluid is identified in the mastoid air cells bilaterally. IMPRESSION: 1. No acute intracranial abnormality. 2. Bilateral mastoid effusions. Electronically Signed   By: Kennith CenterEric  Mansell [name]   On: 07/11/2016 15:13     Medications: Scheduled Meds: . amLODipine  5 mg Oral BID  . enoxaparin (LOVENOX) injection  40 mg Subcutaneous Q24H  . lisinopril  5 mg Oral Daily  . mometasone-formoterol  2 puff  Inhalation BID  . pantoprazole  40 mg Oral BID   Continuous Infusions: . sodium chloride 100 mL/hr at 07/13/16 0833   PRN Meds:.albuterol, albuterol, cyproheptadine, fluticasone, loperamide    Assessment/Plan: 60 y.o. female with acute diarrheal illness  Possibly infectious, possibly SIBO (has scleroderma), possibly other (microscopic colitis, IBD).  Seems like a bit improved over 24 hours. Planning for colonoscopy tomorrow. Clears ok the rest of today.      Rachael FeeJacobs, Seydou Hearns P, MD  07/13/2016, 9:53 AM [facility] Pager [phone]

## 2016-07-13 NOTE — Progress Notes (Signed)
PROGRESS NOTE  [name] QHU:[id number] DOB: January 29, 1956 DOA: 07/10/2016 PCP: Wyatt Haste, MD  HPI/Recap of past 24 hours: [name] is a 60 y.o. female with medical history significant of recent C.Diff infection on flagyl and UTI on bactrim. Patient presents to the ED with c/o generalized weakness in BLE. Diarrhea has actually been getting better, she has been taking meds as instructed. Weakness in legs seemed to onset right after PCP started her on bactrim for UTI. Was admitted for impressive hyponatremia which is improved now.   Today she feels quite well, no cramping, no nausea or diarrhea yet.  Assessment/Plan:  Hyponatremia with decreased serum osmolality Active Problems:  Clostridium difficile diarrhea, has been on Flagyl with continued diarrhea, GI was consulted yesterday due to diarrhea and discharge was cancelled. Plan for colonoscopy 7/18 and patient has had no diarrhea today.  Severe hyponatremia - Most likely due to bactrim side given the HPI above Na improved now with fluid restriction and IVF administration.  C.Diff - completed PO flagyl, though I don't see an positive Cdiff in our system.  Completed treatment for recent UTI - UCx at this admission with insignificant growth  Code Status: FULL   Family Communication: Discussed with pt and her daughters 7/16 and they agree with plan for GI evaluation   Disposition Plan: Likely home 7/18 after colonoscopy.    Consultants:  GI   Procedures:  None   Antimicrobials:  PO  Flagyl course finished in house   Objective: Filed Vitals:   07/12/16 1922 07/12/16 2045 07/13/16 0419 07/13/16 0734  BP:  95/69 95/58   Pulse:  79 78   Temp:  98.1 F (36.7 C) 98.4 F (36.9 C)   TempSrc:  Oral Oral   Resp:  17 16   Height:      Weight:      SpO2: 98% 100% 99% 98%    Intake/Output Summary (Last 24 hours) at 07/13/16 1018 Last data filed at 07/13/16 0800  Gross per  24 hour  Intake   2420 ml  Output      0 ml  Net   2420 ml   Filed Weights   07/10/16 2158  Weight: 52.617 kg (116 lb)    Exam: General:  Alert, oriented, calm, in no acute distress Eyes: pupils round and reactive to light and accomodation, clear sclerea Neck: supple, no masses, trachea mildline  Cardiovascular: RRR, no murmurs or rubs, no peripheral edema  Respiratory: clear to auscultation bilaterally, no wheezes, no crackles  Abdomen: soft, nontender, nondistended, normal bowel tones heard  Skin: dry, no rashes  Musculoskeletal: no joint effusions, normal range of motion  Psychiatric: appropriate affect, normal speech  Neurologic: extraocular muscles intact, clear speech, moving all extremities with intact sensorium    Data Reviewed: CBC:  Recent Labs Lab 07/11/16 0146  WBC 8.0  NEUTROABS 5.8  HGB 10.0*  HCT 29.1*  MCV 86.9  PLT 465*   Basic Metabolic Panel:  Recent Labs Lab 07/11/16 0805 07/11/16 1428 07/11/16 1956 07/12/16 0349 07/13/16 0912  NA 122* 126* 126* 131* 132*  K 4.4 4.3 4.1 4.1 3.6  CL 94* 99* 98* 104 104  CO2 20* 21* 21* 21* 22  GLUCOSE 87 124* 102* 79 101*  BUN '8 10 10 9 12  ' CREATININE 1.63* 1.57* 1.54* 1.37* 1.52*  CALCIUM 8.2* 7.9* 8.0* 8.0* 8.0*   GFR: Estimated Creatinine Clearance: 33.5 mL/min (by C-G formula based on Cr of 1.52). Liver Function Tests:  Recent Labs Lab 07/11/16 0146  AST 24  ALT 16  ALKPHOS 44  BILITOT 0.7  PROT 7.6  ALBUMIN 3.8   No results for input(s): LIPASE, AMYLASE in the last 168 hours. No results for input(s): AMMONIA in the last 168 hours. Coagulation Profile: No results for input(s): INR, PROTIME in the last 168 hours. Cardiac Enzymes: No results for input(s): CKTOTAL, CKMB, CKMBINDEX, TROPONINI in the last 168 hours. BNP (last 3 results) No results for input(s): PROBNP in the last 8760 hours. HbA1C: No results for input(s): HGBA1C in the last 72 hours. CBG:  Recent Labs Lab  07/11/16 0135  GLUCAP 96   Lipid Profile: No results for input(s): CHOL, HDL, LDLCALC, TRIG, CHOLHDL, LDLDIRECT in the last 72 hours. Thyroid Function Tests: No results for input(s): TSH, T4TOTAL, FREET4, T3FREE, THYROIDAB in the last 72 hours. Anemia Panel: No results for input(s): VITAMINB12, FOLATE, FERRITIN, TIBC, IRON, RETICCTPCT in the last 72 hours. Urine analysis:    Component Value Date/Time   COLORURINE YELLOW 07/11/2016 0211   APPEARANCEUR CLEAR 07/11/2016 0211   LABSPEC 1.008 07/11/2016 0211   PHURINE 5.5 07/11/2016 0211   GLUCOSEU NEGATIVE 07/11/2016 0211   GLUCOSEU Negative 02/03/2010 0947   HGBUR NEGATIVE 07/11/2016 0211   BILIRUBINUR NEGATIVE 07/11/2016 0211   BILIRUBINUR n 07/02/2016 1026   KETONESUR NEGATIVE 07/11/2016 0211   PROTEINUR NEGATIVE 07/11/2016 0211   PROTEINUR n 07/02/2016 1026   UROBILINOGEN negative 07/02/2016 1026   UROBILINOGEN 0.2 02/08/2010 0744   NITRITE NEGATIVE 07/11/2016 0211   NITRITE n 07/02/2016 1026   LEUKOCYTESUR MODERATE* 07/11/2016 0211   Sepsis Labs: '@LABRCNTIP' (procalcitonin:4,lacticidven:4)  ) Recent Results (from the past 240 hour(s))  Culture, Urine     Status: Abnormal   Collection Time: 07/11/16  2:11 AM  Result Value Ref Range Status   Specimen Description URINE, CLEAN CATCH  Final   Special Requests NONE  Final   Culture (A)  Final    1,000 COLONIES/mL INSIGNIFICANT GROWTH Performed at [facility]    Report Status 07/13/2016 FINAL  Final  C difficile quick scan w PCR reflex     Status: None   Collection Time: 07/12/16  8:33 PM  Result Value Ref Range Status   C Diff antigen NEGATIVE NEGATIVE Final   C Diff toxin NEGATIVE NEGATIVE Final   C Diff interpretation No C. difficile detected.  Final      Studies: No results found.  Scheduled Meds: . amLODipine  5 mg Oral BID  . lisinopril  5 mg Oral Daily  . mometasone-formoterol  2 puff Inhalation BID  . pantoprazole  40 mg Oral BID  . peg 3350  powder  0.5 kit Oral Once   And  . [START ON 07/14/2016] peg 3350 powder  0.5 kit Oral Once    Continuous Infusions: . sodium chloride 100 mL/hr at 07/13/16 0833     LOS: 2 days   Time spent: 22 minutes  Mir Marry Guan, MD [facility] Pager [phone]  If 7PM-7AM, please contact night-coverage [email or web address] Password [email or web address] 07/13/2016, 10:18 AM

## 2016-07-14 ENCOUNTER — Encounter (HOSPITAL_COMMUNITY): Payer: Self-pay | Admitting: *Deleted

## 2016-07-14 ENCOUNTER — Encounter (HOSPITAL_COMMUNITY)
Admission: EM | Disposition: A | Discharge status: Home / Self Care | Payer: Self-pay | Source: Home / Self Care | Attending: Internal Medicine

## 2016-07-14 ENCOUNTER — Inpatient Hospital Stay (HOSPITAL_COMMUNITY): Payer: Commercial Managed Care - HMO | Admitting: Registered Nurse

## 2016-07-14 DIAGNOSIS — K529 Noninfective gastroenteritis and colitis, unspecified: Secondary | ICD-10-CM

## 2016-07-14 DIAGNOSIS — K633 Ulcer of intestine: Secondary | ICD-10-CM

## 2016-07-14 HISTORY — PX: COLONOSCOPY WITH PROPOFOL: SHX5780

## 2016-07-14 LAB — BASIC METABOLIC PANEL
ANION GAP: 8 (ref 5–15)
BUN: 6 mg/dL (ref 6–20)
CO2: 19 mmol/L — AB (ref 22–32)
Calcium: 8.5 mg/dL — ABNORMAL LOW (ref 8.9–10.3)
Chloride: 111 mmol/L (ref 101–111)
Creatinine, Ser: 1.24 mg/dL — ABNORMAL HIGH (ref 0.44–1.00)
GFR calc non Af Amer: 47 mL/min — ABNORMAL LOW (ref >60)
GFR, EST AFRICAN AMERICAN: 54 mL/min — AB (ref >60)
GLUCOSE: 84 mg/dL (ref 65–99)
POTASSIUM: 3.6 mmol/L (ref 3.5–5.1)
Sodium: 138 mmol/L (ref 135–145)

## 2016-07-14 LAB — CBC
HEMATOCRIT: 31.3 % — AB (ref 36.0–46.0)
Hemoglobin: 10.5 g/dL — ABNORMAL LOW (ref 12.0–15.0)
MCH: 29.9 pg (ref 26.0–34.0)
MCHC: 33.5 g/dL (ref 30.0–36.0)
MCV: 89.2 fL (ref 78.0–100.0)
Platelets: 455 10*3/uL — ABNORMAL HIGH (ref 150–400)
RBC: 3.51 MIL/uL — AB (ref 3.87–5.11)
RDW: 13.7 % (ref 11.5–15.5)
WBC: 5.1 10*3/uL (ref 4.0–10.5)

## 2016-07-14 SURGERY — COLONOSCOPY WITH PROPOFOL
Anesthesia: Monitor Anesthesia Care

## 2016-07-14 MED ORDER — ONDANSETRON HCL 4 MG/2ML IJ SOLN
INTRAMUSCULAR | Status: AC
  Filled 2016-07-14: qty 2

## 2016-07-14 MED ORDER — PROPOFOL 500 MG/50ML IV EMUL
INTRAVENOUS | Status: DC | PRN
  Administered 2016-07-14: 125 ug/kg/min via INTRAVENOUS

## 2016-07-14 MED ORDER — LIDOCAINE HCL (CARDIAC) 20 MG/ML IV SOLN
INTRAVENOUS | Status: AC
  Filled 2016-07-14: qty 5

## 2016-07-14 MED ORDER — LIDOCAINE HCL (CARDIAC) 20 MG/ML IV SOLN
INTRAVENOUS | Status: DC | PRN
  Administered 2016-07-14: 40 mg via INTRAVENOUS

## 2016-07-14 MED ORDER — ONDANSETRON HCL 4 MG/2ML IJ SOLN
INTRAMUSCULAR | Status: DC | PRN
  Administered 2016-07-14: 4 mg via INTRAVENOUS

## 2016-07-14 MED ORDER — PROPOFOL 10 MG/ML IV BOLUS
INTRAVENOUS | Status: DC | PRN
  Administered 2016-07-14 (×2): 20 mg via INTRAVENOUS

## 2016-07-14 MED ORDER — PROPOFOL 10 MG/ML IV BOLUS
INTRAVENOUS | Status: AC
  Filled 2016-07-14: qty 40

## 2016-07-14 MED ORDER — LACTATED RINGERS IV SOLN: INTRAVENOUS | Status: DC

## 2016-07-14 SURGICAL SUPPLY — 22 items

## 2016-07-14 NOTE — Anesthesia Postprocedure Evaluation (Signed)
Anesthesia Post Note  Patient: [name]  Procedure(s) Performed: Procedure(s) (LRB): COLONOSCOPY WITH PROPOFOL (N/A)  Patient location during evaluation: Endoscopy Anesthesia Type: MAC Level of consciousness: awake and alert, oriented and patient cooperative Pain management: pain level controlled Vital Signs Assessment: post-procedure vital signs reviewed and stable Respiratory status: spontaneous breathing, nonlabored ventilation and respiratory function stable Cardiovascular status: blood pressure returned to baseline and stable Postop Assessment: no signs of nausea or vomiting Anesthetic complications: no    Last Vitals:  Filed Vitals:   07/14/16 1020 07/14/16 1030  BP: 94/52 107/59  Pulse: 72 71  Temp: 36.6 C   Resp: 18 15    Last Pain:  Filed Vitals:   07/14/16 1034  PainSc: 0-No pain                 Dawnisha Marquina,E. Rameen Gohlke

## 2016-07-14 NOTE — Transfer of Care (Signed)
Immediate Anesthesia Transfer of Care Note  Patient: [name]  Procedure(s) Performed: Procedure(s): COLONOSCOPY WITH PROPOFOL (N/A)  Patient Location: PACU  Anesthesia Type:MAC  Level of Consciousness:  sedated, patient cooperative and responds to stimulation  Airway & Oxygen Therapy:Patient Spontanous Breathing and Patient connected to face mask oxgen  Post-op Assessment:  Report given to PACU RN and Post -op Vital signs reviewed and stable  Post vital signs:  Reviewed and stable  Last Vitals:  Filed Vitals:   07/14/16 0702 07/14/16 0933  BP: 106/58 128/68  Pulse: 74 65  Temp: 36.7 C 36.9 C  Resp: 17 18    Complications: No apparent anesthesia complications

## 2016-07-14 NOTE — Anesthesia Preprocedure Evaluation (Addendum)
Anesthesia Evaluation  Patient identified by MRN, date of birth, ID band Patient awake    Reviewed: Allergy & Precautions, NPO status , Patient's Chart, lab work & pertinent test results  History of Anesthesia Complications Negative for: history of anesthetic complications  Airway Mallampati: III   Neck ROM: Full  Mouth opening: Limited Mouth Opening  Dental  (+) Dental Advisory Given, Poor Dentition   Pulmonary asthma , COPD,  COPD inhaler,    breath sounds clear to auscultation       Cardiovascular hypertension, Pt. on medications (-) angina Rhythm:Regular Rate:Normal  '11 ECHO: EF 55-60%, mild MR, mild TR   Neuro/Psych negative neurological ROS     GI/Hepatic Neg liver ROS, GERD  Medicated and Poorly Controlled,  Endo/Other  scleroderma  Renal/GU Renal InsufficiencyRenal disease (creat 1.24)     Musculoskeletal   Abdominal   Peds  Hematology negative hematology ROS (+)   Anesthesia Other Findings   Reproductive/Obstetrics                          Anesthesia Physical Anesthesia Plan  ASA: III  Anesthesia Plan: MAC   Post-op Pain Management:    Induction:   Airway Management Planned: Natural Airway  Additional Equipment:   Intra-op Plan:   Post-operative Plan:   Informed Consent: I have reviewed the patients History and Physical, chart, labs and discussed the procedure including the risks, benefits and alternatives for the proposed anesthesia with the patient or authorized representative who has indicated his/her understanding and acceptance.   Dental advisory given  Plan Discussed with: CRNA and Surgeon  Anesthesia Plan Comments: (Plan routine monitors, MAC)       Anesthesia Quick Evaluation

## 2016-07-14 NOTE — H&P (View-Only) (Signed)
[facility] Progress Note    Since last GI note: Diarrhea improving past 24 hours (none in 3 trips to BR to urinate).  No abd pains, no overt bleeding.  Objective: Vital signs in last 24 hours: Temp:  [98.1 F (36.7 C)-98.4 F (36.9 C)] 98.4 F (36.9 C) (07/17 0419) Pulse Rate:  [76-79] 78 (07/17 0419) Resp:  [16-17] 16 (07/17 0419) BP: (95-100)/(58-69) 95/58 mmHg (07/17 0419) SpO2:  [98 %-100 %] 98 % (07/17 0734) Last BM Date: 07/12/16 General: alert and oriented times 3 Heart: regular rate and rythm Abdomen: soft, non-tender, non-distended, normal bowel sounds   Lab Results:  Recent Labs  07/11/16 0146  WBC 8.0  HGB 10.0*  PLT 433*  MCV 86.9    Recent Labs  07/11/16 1956 07/12/16 0349 07/13/16 0912  NA 126* 131* 132*  K 4.1 4.1 3.6  CL 98* 104 104  CO2 21* 21* 22  GLUCOSE 102* 79 101*  BUN 10 9 12  CREATININE 1.54* 1.37* 1.52*  CALCIUM 8.0* 8.0* 8.0*    Recent Labs  07/11/16 0146  PROT 7.6  ALBUMIN 3.8  AST 24  ALT 16  ALKPHOS 44  BILITOT 0.7    Studies/Results: Ct Head Wo Contrast  07/11/2016  CLINICAL DATA:  Generalized weakness. EXAM: CT HEAD WITHOUT CONTRAST TECHNIQUE: Contiguous axial images were obtained from the base of the skull through the vertex without intravenous contrast. COMPARISON:  None. FINDINGS: There is no evidence for acute hemorrhage, hydrocephalus, mass lesion, or abnormal extra-axial fluid collection. No definite CT evidence for acute infarction. Frontal and sphenoid sinuses are clear. Ethmoid air cells are clear appeared fluid is identified in the mastoid air cells bilaterally. IMPRESSION: 1. No acute intracranial abnormality. 2. Bilateral mastoid effusions. Electronically Signed   By: Eric  Mansell [name]   On: 07/11/2016 15:13     Medications: Scheduled Meds: . amLODipine  5 mg Oral BID  . enoxaparin (LOVENOX) injection  40 mg Subcutaneous Q24H  . lisinopril  5 mg Oral Daily  . mometasone-formoterol  2 puff  Inhalation BID  . pantoprazole  40 mg Oral BID   Continuous Infusions: . sodium chloride 100 mL/hr at 07/13/16 0833   PRN Meds:.albuterol, albuterol, cyproheptadine, fluticasone, loperamide    Assessment/Plan: 60 y.o. female with acute diarrheal illness  Possibly infectious, possibly SIBO (has scleroderma), possibly other (microscopic colitis, IBD).  Seems like a bit improved over 24 hours. Planning for colonoscopy tomorrow. Clears ok the rest of today.      Jacobs, Daniel P, MD  07/13/2016, 9:53 AM [facility] Pager [phone]     

## 2016-07-14 NOTE — Interval H&P Note (Signed)
History and Physical Interval Note:  07/14/2016 9:54 AM  [name]  has presented today for surgery, with the diagnosis of diarrhea  The various methods of treatment have been discussed with the patient and family. After consideration of risks, benefits and other options for treatment, the patient has consented to  Procedure(s): COLONOSCOPY WITH PROPOFOL (N/A) as a surgical intervention .  The patient's history has been reviewed, patient examined, no change in status, stable for surgery.  I have reviewed the patient's chart and labs.  Questions were answered to the patient's satisfaction.     Rachael FeeJacobs, Susi Goslin P

## 2016-07-14 NOTE — Op Note (Signed)
[facility] Patient Name: Caitlyn SpikesSandra Coover Procedure Date: 07/14/2016 MRN: [id number] Attending MD: Rachael Feeaniel P Honesti Seaberg , MD Date of Birth: [birth year] CSN: [id number] Age: 60 Admit Type: Inpatient Procedure:                Colonoscopy Indications:              Chronic diarrhea Providers:                Rachael Feeaniel P. Colyn Miron, MD, Janae SauceKaren D. Steele BergHinson, RN, Arlee Muslimhris                            Chandler Tech., Technician, Doreene BurkeBeth Marshall, CRNA Referring MD:             Amada JupiterHenry Danis, MD Medicines:                Monitored Anesthesia Care Complications:            No immediate complications. Estimated blood loss:                            None. Estimated Blood Loss:     Estimated blood loss: none. Procedure:                Pre-Anesthesia Assessment:                           - Prior to the procedure, a History and Physical                            was performed, and patient medications and                            allergies were reviewed. The patient's tolerance of                            previous anesthesia was also reviewed. The risks                            and benefits of the procedure and the sedation                            options and risks were discussed with the patient.                            All questions were answered, and informed consent                            was obtained. Prior Anticoagulants: The patient has                            taken no previous anticoagulant or antiplatelet                            agents. ASA Grade Assessment: II - A patient with  mild systemic disease. After reviewing the risks                            and benefits, the patient was deemed in                            satisfactory condition to undergo the procedure.                           After obtaining informed consent, the colonoscope                            was passed under direct vision. Throughout the                            procedure,  the patient's blood pressure, pulse, and                            oxygen saturations were monitored continuously. The                            EC-[id number] ([id number]) scope was introduced through                            the anus and advanced to the the terminal ileum.                            The colonoscopy was performed without difficulty.                            The patient tolerated the procedure well. The                            quality of the bowel preparation was excellent. The                            terminal ileum, ileocecal valve, appendiceal                            orifice, and rectum were photographed. Scope In: 10:03:20 AM Scope Out: 10:15:26 AM Scope Withdrawal Time: 0 hours 8 minutes 46 seconds  Total Procedure Duration: 0 hours 12 minutes 6 seconds  Findings:      The terminal ileum appeared normal.      Ulcerated mucosa with no stigmata of recent bleeding were present at the       splenic flexure. This was a 15-20cm long, superficial, continuous,       non-circumferential ulceration with surrounding edematous mucosa. This       was most consistent with mild ischemic colitis damage, biopseis were       taken.      The exam was otherwise without abnormality on direct and retroflexion       views.      Biopsies for histology were taken with a cold forceps from the entire       colon for evaluation of microscopic  colitis. Impression:               - The examined portion of the ileum was normal (no                            sign of Crohn'd disease)                           - Mucosal ulceration (see above, most consistent                            with mild ischemic colitis). Biopsied. This may                            have been the result of her acute diarrheal illness                            rather than the cause (she never had bloody                            diarrhea).                           - The examination was otherwise normal on direct                             and retroflexion views.                           - Biopsies were taken with a cold forceps from the                            entire colon for evaluation of microscopic colitis. Moderate Sedation:      N/A- Per Anesthesia Care Recommendation:           - Return patient to hospital ward for possible                            discharge same day.                           - Await pathology results. Procedure Code(s):        --- Professional ---                           [phone], Colonoscopy, flexible; with biopsy, single                            or multiple Diagnosis Code(s):        --- Professional ---                           K63.3, Ulcer of intestine                           K52.9, Noninfective gastroenteritis and colitis,  unspecified CPT copyright 2016 [employer]. All rights reserved. The codes documented in this report are preliminary and upon coder review may  be revised to meet current compliance requirements. Rachael Fee, MD 07/14/2016 10:27:49 AM This report has been signed electronically. Number of Addenda: 0

## 2016-07-15 ENCOUNTER — Encounter (HOSPITAL_COMMUNITY): Payer: Self-pay | Admitting: Gastroenterology

## 2016-07-15 LAB — EHRLICHIA ANTIBODY PANEL
E CHAFFEENSIS AB, IGG: NEGATIVE
E chaffeensis (HGE) Ab, IgM: NEGATIVE
E. Chaffeensis (HME) IgM Titer: NEGATIVE
E.Chaffeensis (HME) IgG: NEGATIVE

## 2016-07-16 ENCOUNTER — Encounter: Payer: Self-pay | Admitting: Family Medicine

## 2016-07-16 ENCOUNTER — Ambulatory Visit (INDEPENDENT_AMBULATORY_CARE_PROVIDER_SITE_OTHER): Payer: Commercial Managed Care - HMO | Admitting: Family Medicine

## 2016-07-16 VITALS — BP 120/78 | HR 80 | Wt 112.0 lb

## 2016-07-16 DIAGNOSIS — N259 Disorder resulting from impaired renal tubular function, unspecified: Secondary | ICD-10-CM | POA: Diagnosis not present

## 2016-07-16 DIAGNOSIS — N184 Chronic kidney disease, stage 4 (severe): Secondary | ICD-10-CM | POA: Diagnosis not present

## 2016-07-16 DIAGNOSIS — K529 Noninfective gastroenteritis and colitis, unspecified: Secondary | ICD-10-CM

## 2016-07-16 DIAGNOSIS — M349 Systemic sclerosis, unspecified: Secondary | ICD-10-CM | POA: Diagnosis not present

## 2016-07-16 DIAGNOSIS — E871 Hypo-osmolality and hyponatremia: Secondary | ICD-10-CM | POA: Diagnosis not present

## 2016-07-16 NOTE — Progress Notes (Signed)
   Subjective:    Patient ID: [name], female    DOB: 06/11/1956, 60 y.o.   MRN: [id number]  HPI He is here for follow-up visit after recent hospitalization. This was her second hospitalization. The first one the diagnosis of C. difficile colitis was made. She had 2 courses of antibiotic but was still having difficulties with this getting more and more weak. She was readmitted and reevaluated for continued difficulty with diarrhea as follows weakness. She was found to be hyponatremic. This was corrected. She also had a colonoscopy which did show some colitis. She has an underlying history of CK D as well as systemic sclerosis. Her creatinine did improve to 1.24 on her last blood work. She will need referrals for her insurance to GI and renal. Presently she is having no abdominal pain, nausea, vomiting or diarrhea. Her medications were reviewed.   Review of Systems     Objective:   Physical Exam  Alert and in no distress. Hospital record including blood work, pathology and colonoscopy was reviewed. Abdominal exam shows no masses or tenderness with normal bowel sounds      Assessment & Plan:  Colitis - Plan: Ambulatory referral to Gastroenterology  Disorder resulting from impaired renal function - Plan: Ambulatory referral to Nephrology  CKD (chronic kidney disease), stage IV (HCC) - Plan: Ambulatory referral to Nephrology  PROGRESSIVE SYSTEMIC SCLEROSIS  Hyponatremia with decreased serum osmolality - Plan: Ambulatory referral to Nephrology Referrals made to nephrology and GI for follow-up purposes. As stated above her renal functions actually look pretty good and her sodium has returned to normal levels. Return here as needed.

## 2016-07-20 ENCOUNTER — Telehealth: Payer: Self-pay | Admitting: Family Medicine

## 2016-07-20 NOTE — Telephone Encounter (Signed)
Pt has approvel for GI [facility] and jacobs AUTH# [id number] good 07/20/16 to 01/16/17 total of 6 visits  Pt has approvel for Dr.Patel Kidney Dr. Berkley Harvey [id number] good 07/20/16 to 01/16/17 total of 6 visits  Pt is aware

## 2016-07-20 NOTE — Telephone Encounter (Signed)
Called and left message for pt to call me back.

## 2016-07-23 DIAGNOSIS — N183 Chronic kidney disease, stage 3 (moderate): Secondary | ICD-10-CM | POA: Diagnosis not present

## 2016-07-23 DIAGNOSIS — E889 Metabolic disorder, unspecified: Secondary | ICD-10-CM | POA: Diagnosis not present

## 2016-07-23 DIAGNOSIS — N189 Chronic kidney disease, unspecified: Secondary | ICD-10-CM | POA: Diagnosis not present

## 2016-07-29 DIAGNOSIS — E889 Metabolic disorder, unspecified: Secondary | ICD-10-CM | POA: Diagnosis not present

## 2016-07-29 DIAGNOSIS — D631 Anemia in chronic kidney disease: Secondary | ICD-10-CM | POA: Diagnosis not present

## 2016-07-29 DIAGNOSIS — I129 Hypertensive chronic kidney disease with stage 1 through stage 4 chronic kidney disease, or unspecified chronic kidney disease: Secondary | ICD-10-CM | POA: Diagnosis not present

## 2016-07-29 DIAGNOSIS — M9080 Osteopathy in diseases classified elsewhere, unspecified site: Secondary | ICD-10-CM | POA: Diagnosis not present

## 2016-07-29 DIAGNOSIS — N183 Chronic kidney disease, stage 3 (moderate): Secondary | ICD-10-CM | POA: Diagnosis not present

## 2016-08-06 ENCOUNTER — Encounter: Payer: Self-pay | Admitting: Physician Assistant

## 2016-08-06 ENCOUNTER — Telehealth: Payer: Self-pay | Admitting: *Deleted

## 2016-08-06 ENCOUNTER — Ambulatory Visit (INDEPENDENT_AMBULATORY_CARE_PROVIDER_SITE_OTHER): Payer: Commercial Managed Care - HMO | Admitting: Physician Assistant

## 2016-08-06 VITALS — BP 94/60 | HR 68 | Ht 64.0 in | Wt 111.6 lb

## 2016-08-06 DIAGNOSIS — K559 Vascular disorder of intestine, unspecified: Secondary | ICD-10-CM

## 2016-08-06 NOTE — Telephone Encounter (Signed)
Per Amy Esterwood PA's instructions, I faxed the signed office note from today, 08-06-16 to Dr. Stacey DrainWilliam Truslow, Rheumatologist.

## 2016-08-06 NOTE — Progress Notes (Signed)
Subjective:    Patient ID: [name], female    DOB: 05/09/1956, 60 y.o.   MRN: [id number]  HPI [name] is a pleasant 60 year old white female known to Dr. Marina Goodell who comes in today for a post hospital follow-up. Patient has history of hypertension, scleroderma, GERD and chronic kidney disease. She had an admission in mid July with an acute episode of abdominal pain diarrhea and vomiting. This was in the background of a 3 week history of diarrhea. GI was consulted and she was scheduled for colonoscopy was done per Dr. Christella Hartigan on 07/14/2016. She was noted to have ulcerated mucosa at the splenic flexure , 15-20 cm long segment  with superficial non-circumferential ulceration and edema. Random biopsies were done because of the chronic diarrhea and these were negative. Biopsies from the splenic flexure showed an active colitis with focal ulceration consistent with clinical picture of segmental ischemic colitis. Patient says she has been doing well over the past few weeks. She has no residual diarrhea, bowel movements have returned to normal. She is no complaints of abdominal pain or cramping and no further bleeding.  Review of Systems Pertinent positive and negative review of systems were noted in the above HPI section.  All other review of systems was otherwise negative.  Outpatient Encounter Prescriptions as of 08/06/2016  Medication Sig  . albuterol (PROAIR HFA) 108 (90 BASE) MCG/ACT inhaler Inhale 2 puffs into the lungs every 6 (six) hours as needed for wheezing.  [name] albuterol (PROVENTIL) (2.5 MG/3ML) 0.083% nebulizer solution Take 3 mLs (2.5 mg total) by nebulization every 6 (six) hours as needed for wheezing or shortness of breath.  [name] amLODipine (NORVASC) 5 MG tablet Take 5 mg by mouth 2 (two) times daily.   . budesonide-formoterol (SYMBICORT) 160-4.5 MCG/ACT inhaler Inhale 2 puffs into the lungs 2 (two) times daily.  . cyproheptadine (PERIACTIN) 4 MG tablet Take 4 mg by mouth 2 (two) times  daily as needed for allergies or rhinitis. Reported on 07/16/2016  . fluticasone (FLONASE) 50 MCG/ACT nasal spray Place 1 spray into both nostrils daily as needed (for nasal congestion). Reported on 07/16/2016  . lisinopril (PRINIVIL,ZESTRIL) 5 MG tablet Take 1 tablet (5 mg total) by mouth daily.  [name] omeprazole (PRILOSEC) 20 MG capsule Take 20 mg by mouth 2 (two) times daily.    . pseudoephedrine (SUDAFED) 30 MG tablet Take 30 mg by mouth every 8 (eight) hours as needed for congestion. Reported on 07/16/2016   No facility-administered encounter medications on file as of 08/06/2016.    Allergies  Allergen Reactions  . Avelox [Moxifloxacin Hcl In Nacl]     Caused tachycardia  . Codeine Hives and Other (See Comments)    hallucination  . Penicillins Hives    Has patient had a PCN reaction causing immediate rash, facial/tongue/throat swelling, SOB or lightheadedness with hypotension:NO Has patient had a PCN reaction causing severe rash involving mucus membranes or skin necrosis:UNSURE Has patient had a PCN reaction that required hospitalization:No Has patient had a PCN reaction occurring within the last 10 years:No If all of the above answers are "NO", then may proceed with Cephalosporin use.    Patient Active Problem List   Diagnosis Date Noted  . Hyponatremia with decreased serum osmolality 07/11/2016  . Clostridium difficile diarrhea 07/11/2016  . Hyponatremia 07/11/2016  . Colitis 06/24/2016  . UTI (lower urinary tract infection) 06/24/2016  . CKD (chronic kidney disease), stage IV (HCC)   . GERD (gastroesophageal reflux disease) 12/04/2011  . Disorder  resulting from impaired renal function 12/30/2009  . HYPERTENSION, BENIGN 12/26/2009  . 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 PROGRESSIVE SYSTEMIC SCLEROSIS 06/22/2008  . WEIGHT LOSS-ABNORMAL 06/22/2008  . ESOPHAGEAL STRICTURE 07/23/2006  . HIATAL HERNIA 07/23/2006   Social History   Social History  . Marital status: Single    Spouse  name: N/A  . Number of children: N/A  . Years of education: N/A   Occupational History  . Not on file.   Social History Main Topics  . Smoking status: Never Smoker  . Smokeless tobacco: Never Used  . Alcohol use No  . Drug use: No  . Sexual activity: Not on file   Other Topics Concern  . Not on file   Social History Narrative  . No narrative on file    [name]'s family history includes Heart failure in her mother; Hepatitis in her mother.      Objective:    Vitals:   08/06/16 0820  BP: 94/60  Pulse: 68    Physical Exam    well-developed white female in no acute distress, pleasant blood pressure 94/60 pulse 68 height 5 foot 4 weight 111 BMI of 19. HEENT; nontraumatic normocephalic EOMI PERRLA are anicteric she does have skin changes around her mouth all of scleroderma, Cardiovascular; regular rate and rhythm with S1-S2 no murmur rub or gallop, Pulmonary ;clear bilaterally, Abdomen; soft flat nontender nondistended no palpable mass or hepatosplenomegaly rectal exam not done, Extremities ; significant skin thickening of her hands and fingers, Neuropsych; mood and affect appropriate       Assessment & Plan:   #1 60 yo female with scleroderma with recent hospitalization with acute segmental ischemic colitis. Symptoms have completely resolved and she is currently feeling well. Suspect underlying scleroderma have played a role in this recent episode. Also note that her blood pressure is on the low side, and relative hypotension can also be associated with segmental ischemic colitis. #2 history of hypertension, patient is on lisinopril and amlodipine #3 chronic kidney disease #4 GERD  Plan; add daily probiotic, Culturelle,, Florastor , or align Emphasized importance of good hydration in preventing these episodes Have asked patient to make an appointment with Dr.Truslow her rheumatologist regarding decreasing dosages of lisinopril and amlodipine.  She will follow up with  Dr. Marina GoodellPerry, or myself as needed   Sammuel Coopermy S Madina Galati PA-C 08/06/2016   Cc: Ronnald NianLalonde, John C, MD

## 2016-08-06 NOTE — Patient Instructions (Signed)
Push hydration daily. Start a probiotic. Suggestions are Align, Culturelle, [employer]. We have given you samples of Align with a coupon.

## 2016-08-06 NOTE — Telephone Encounter (Signed)
Tried to fax the office note from today, 10-8. Tried 5 times and the fax did not go through.  I called and verified the fax number. I mailed out a copy of the office note from today,. The patient saw Amy Esterwood PA-C.

## 2016-08-10 ENCOUNTER — Telehealth: Payer: Self-pay | Admitting: Medical

## 2016-08-10 MED ORDER — BUDESONIDE-FORMOTEROL FUMARATE 160-4.5 MCG/ACT IN AERO: 2.0000 | INHALATION_SPRAY | Freq: Two times a day (BID) | RESPIRATORY_TRACT | 0 refills | Status: DC

## 2016-08-10 NOTE — Telephone Encounter (Signed)
I think she is primarily seeing Dr. Susann GivensLalonde, but give sample if we have it

## 2016-08-10 NOTE — Telephone Encounter (Signed)
Pt requested samples of Symbicort

## 2016-08-10 NOTE — Telephone Encounter (Signed)
Sample given, pt informed  

## 2016-08-10 NOTE — Progress Notes (Signed)
Agree with initial assessment and plans as outlined 

## 2016-08-27 DIAGNOSIS — M349 Systemic sclerosis, unspecified: Secondary | ICD-10-CM | POA: Diagnosis not present

## 2016-08-27 DIAGNOSIS — N183 Chronic kidney disease, stage 3 (moderate): Secondary | ICD-10-CM | POA: Diagnosis not present

## 2016-08-27 DIAGNOSIS — R197 Diarrhea, unspecified: Secondary | ICD-10-CM | POA: Diagnosis not present

## 2016-08-27 DIAGNOSIS — D72829 Elevated white blood cell count, unspecified: Secondary | ICD-10-CM | POA: Diagnosis not present

## 2016-09-14 ENCOUNTER — Other Ambulatory Visit: Payer: Self-pay

## 2016-09-14 MED ORDER — BUDESONIDE-FORMOTEROL FUMARATE 160-4.5 MCG/ACT IN AERO: 2.0000 | INHALATION_SPRAY | Freq: Two times a day (BID) | RESPIRATORY_TRACT | 0 refills | Status: DC

## 2016-09-14 NOTE — Progress Notes (Signed)
Ok to dispense 1 inhaler per [employer] Group[employer]ane [name]. Pt notified.

## 2016-10-13 ENCOUNTER — Telehealth: Payer: Self-pay | Admitting: Medical

## 2016-10-13 MED ORDER — BUDESONIDE-FORMOTEROL FUMARATE 160-4.5 MCG/ACT IN AERO: 2.0000 | INHALATION_SPRAY | Freq: Two times a day (BID) | RESPIRATORY_TRACT | 0 refills | Status: DC

## 2016-10-13 NOTE — Telephone Encounter (Signed)
Pt req'd sample Symbicort (ok'd previously by [employer]ane)

## 2016-11-09 ENCOUNTER — Telehealth: Payer: Self-pay | Admitting: Family Medicine

## 2016-11-09 MED ORDER — BUDESONIDE-FORMOTEROL FUMARATE 160-4.5 MCG/ACT IN AERO: 2.0000 | INHALATION_SPRAY | Freq: Two times a day (BID) | RESPIRATORY_TRACT | 0 refills | Status: DC

## 2016-11-09 NOTE — Telephone Encounter (Signed)
Pt called for sample Symbicort, #1 given

## 2016-11-10 DIAGNOSIS — L98499 Non-pressure chronic ulcer of skin of other sites with unspecified severity: Secondary | ICD-10-CM | POA: Diagnosis not present

## 2016-11-10 DIAGNOSIS — M79641 Pain in right hand: Secondary | ICD-10-CM | POA: Diagnosis not present

## 2016-11-10 DIAGNOSIS — M79642 Pain in left hand: Secondary | ICD-10-CM | POA: Diagnosis not present

## 2016-11-10 DIAGNOSIS — M341 CR(E)ST syndrome: Secondary | ICD-10-CM | POA: Diagnosis not present

## 2016-11-10 DIAGNOSIS — M349 Systemic sclerosis, unspecified: Secondary | ICD-10-CM | POA: Diagnosis not present

## 2016-11-13 ENCOUNTER — Encounter: Payer: Self-pay | Admitting: Medical

## 2016-11-13 ENCOUNTER — Ambulatory Visit (INDEPENDENT_AMBULATORY_CARE_PROVIDER_SITE_OTHER): Payer: Commercial Managed Care - HMO | Admitting: Medical

## 2016-11-13 DIAGNOSIS — J0110 Acute frontal sinusitis, unspecified: Secondary | ICD-10-CM

## 2016-11-13 DIAGNOSIS — J4520 Mild intermittent asthma, uncomplicated: Secondary | ICD-10-CM

## 2016-11-13 MED ORDER — ALBUTEROL SULFATE HFA 108 (90 BASE) MCG/ACT IN AERS: 2.0000 | INHALATION_SPRAY | Freq: Four times a day (QID) | RESPIRATORY_TRACT | 0 refills | Status: DC | PRN

## 2016-11-13 MED ORDER — AZELASTINE HCL 0.15 % NA SOLN: 1.0000 | Freq: Two times a day (BID) | NASAL | 2 refills | Status: DC

## 2016-11-13 MED ORDER — MOMETASONE FURO-FORMOTEROL FUM 200-5 MCG/ACT IN AERO: 2.0000 | INHALATION_SPRAY | Freq: Two times a day (BID) | RESPIRATORY_TRACT | 2 refills | Status: DC

## 2016-11-13 MED ORDER — CLARITHROMYCIN 500 MG PO TABS: 500.0000 mg | ORAL_TABLET | Freq: Two times a day (BID) | ORAL | 0 refills | Status: DC

## 2016-11-13 NOTE — Progress Notes (Signed)
Subjective:  [name] is a 60 y.o. female who presents for possible sinus infection.  She notes 2 week hx/o sinus congestion, productive mucous, sinus pressure, cough, some SOB.  Her albuterol/pro air samples was from 2015.   Still using 2 Symbicort BID.   Nonsmoker.  No fever.  Has had sick contacts.   No other aggravating or relieving factors.  No other c/o.  Past Medical History:  Diagnosis Date  . Allergic rhinitis   . Asthma   . CKD (chronic kidney disease), stage IV (HCC)   . Esophageal stricture   . GERD (gastroesophageal reflux disease)   . Hypertension   . Scleroderma (HCC)    ROS as in subjective   Objective: BP (!) 98/50   Pulse 61   Temp 98.4 F (36.9 C)   Wt 111 lb (50.3 kg)   BMI 19.05 kg/m   General appearance: Alert, WD/WN, no distress                             Skin: warm, no rash                           Head: + frontal sinus tenderness,                            Eyes: conjunctiva normal, corneas clear, PERRLA                            Ears: pearly TMs, external ear canals normal                          Nose: septum midline, turbinates swollen, with erythema and clear discharge             Mouth/throat: MMM, tongue normal, mild pharyngeal erythema                           Neck: supple, no adenopathy, no thyromegaly, non tender                         Lungs: decreased breath sounds, faint wheezes, rales, or rhonchi     Assessment  Encounter Diagnoses  Name Primary?  [name]. Asthmatic bronchitis, mild intermittent, uncomplicated   . Acute frontal sinusitis, recurrence not specified       Plan: Begin Biaxin, rest, hydrate well, and if not improving in the next 3-4 days, then call or return  Asthma - she was in a hurry today and declined PFTs.  I advised that next visit we will do PFTs as she hasn't had any recent baseline PFTs.  Insurer doesn't cover Symbicort, so change to dulera, refilled other medications  allergies - trial of medications  below at her request.   [name] was seen today for coughing.  Diagnoses and all orders for this visit:  Asthmatic bronchitis, mild intermittent, uncomplicated -     clarithromycin (BIAXIN) 500 MG tablet; Take 1 tablet (500 mg total) by mouth 2 (two) times daily.  Acute frontal sinusitis, recurrence not specified -     clarithromycin (BIAXIN) 500 MG tablet; Take 1 tablet (500 mg total) by mouth 2 (two) times daily.  Other orders -     Azelastine HCl 0.15 %  SOLN; Place 1 spray into the nose 2 (two) times daily. -     albuterol (PROAIR HFA) 108 (90 Base) MCG/ACT inhaler; Inhale 2 puffs into the lungs every 6 (six) hours as needed for wheezing. -     mometasone-formoterol (DULERA) 200-5 MCG/ACT AERO; Inhale 2 puffs into the lungs 2 (two) times daily.  Patient was advised to call or return if worse or not improving in the next few days.    Patient voiced understanding of diagnosis, recommendations, and treatment plan.

## 2016-11-13 NOTE — Patient Instructions (Signed)
Call your insurer to find out the "preferred" drug for allergies, rescue inhaler, and preventative inhaler  Nasal spray Astepro Patanase Dymista  Albuterol rescue Proair Proventil Proair Respiclick Ventolin Xopenex   Prevention inhaler Symbicort Dulera Advair Anoro Spiriva

## 2016-11-15 ENCOUNTER — Telehealth: Payer: Self-pay | Admitting: Medical

## 2016-11-15 NOTE — Telephone Encounter (Signed)
P.A. DULERA 

## 2016-11-28 NOTE — Telephone Encounter (Signed)
P.A. Elwin Sleightulera denied pt must have tried Virgel BouquetBreo & Advair Diskus & Advair HFA.  Do  Want to switch or continue giving her samples?

## 2016-12-01 ENCOUNTER — Other Ambulatory Visit: Payer: Self-pay | Admitting: Medical

## 2016-12-01 MED ORDER — FLUTICASONE-SALMETEROL 250-50 MCG/DOSE IN AEPB: 1.0000 | INHALATION_SPRAY | Freq: Two times a day (BID) | RESPIRATORY_TRACT | 0 refills | Status: DC

## 2016-12-01 NOTE — Telephone Encounter (Signed)
I sent advair due to insurance requirement to use instaed of Symbicrot or Dulera.   F/u 57mo on this and plan to do PFT at that visit.

## 2016-12-02 NOTE — Telephone Encounter (Signed)
Pt informed, but stated used Advair in past & it didn't really help

## 2016-12-14 ENCOUNTER — Telehealth: Payer: Self-pay | Admitting: Medical

## 2016-12-14 NOTE — Telephone Encounter (Signed)
Pt called requesting samples of Symbicort.  Advised pt will see after first of year if insurance will cover either Symbicort or [facility]ulera

## 2017-01-14 ENCOUNTER — Telehealth: Payer: Self-pay | Admitting: Medical

## 2017-01-14 NOTE — Telephone Encounter (Signed)
Pt called for samples of symbicort. Pt can be reached at [phone].

## 2017-02-26 ENCOUNTER — Encounter: Payer: Self-pay | Admitting: Family Medicine

## 2017-02-26 ENCOUNTER — Ambulatory Visit (INDEPENDENT_AMBULATORY_CARE_PROVIDER_SITE_OTHER): Payer: Medicare PPO | Admitting: Family Medicine

## 2017-02-26 VITALS — BP 110/70 | HR 68 | Temp 97.6°F | Resp 16 | Wt 111.2 lb

## 2017-02-26 DIAGNOSIS — J0140 Acute pansinusitis, unspecified: Secondary | ICD-10-CM

## 2017-02-26 MED ORDER — CLARITHROMYCIN 500 MG PO TABS: 500.0000 mg | ORAL_TABLET | Freq: Two times a day (BID) | ORAL | 0 refills | Status: DC

## 2017-02-26 NOTE — Progress Notes (Signed)
Subjective: Chief Complaint  Patient presents with  . sick    started monday, cough, congestion, sinus pressure and headache     [name] is a 61 y.o. female who presents for a 5 day history of sinus pressure, thick nasal drainage, headache, sore throat, right ear pain, and cough with yellowish sputum.  History of asthma and states she had to use her inhaler 2 times yesterday.  No recent antibiotics.  Does not smoke.   Denies fever, chills, body aches, chest pain, shortness of breath, wheezing, GI symptoms.   Treatment to date: sudafed, nasonex, tylenol, albuterol .  Denies sick contacts.  No other aggravating or relieving factors.  No other c/o.  ROS as in subjective.   Objective: Vitals:   02/26/17 1038  BP: 110/70  Pulse: 68  Resp: 16  Temp: 97.6 F (36.4 C)    General appearance: Alert, WD/WN, no distress, mildly ill appearing                             Skin: warm, no rash                           Head: + right frontal and maxillary sinus tenderness                            Eyes: conjunctiva normal, corneas clear, PERRLA                            Ears: pearly TMs, external ear canals normal                          Nose: septum midline, turbinates swollen, with erythema and no discharge             Mouth/throat: MMM, tongue normal, mild pharyngeal erythema                           Neck: supple, no adenopathy, no thyromegaly, nontender                          Heart: RRR, normal S1, S2, no murmurs                         Lungs: scattered rhonchi, no wheezes, or rales. No accessory muscle use. Speaking in complete sentences without difficulty.       Assessment: Acute pansinusitis, recurrence not specified    Plan: Discussed diagnosis and treatment of acute sinusitis. Biaxin sent to pharmacy. Suggested symptomatic OTC remedies. Nasal saline spray for congestion.  Tylenol or Ibuprofen OTC for fever and malaise.  Call/return if not back to baseline after  finishing antibiotic.

## 2017-03-03 ENCOUNTER — Telehealth: Payer: Self-pay | Admitting: Medical

## 2017-03-03 MED ORDER — BUDESONIDE-FORMOTEROL FUMARATE 160-4.5 MCG/ACT IN AERO: 2.0000 | INHALATION_SPRAY | Freq: Two times a day (BID) | RESPIRATORY_TRACT | 0 refills | Status: DC

## 2017-03-03 NOTE — Telephone Encounter (Signed)
Pt called for Symbicort sample, sample given

## 2017-03-18 ENCOUNTER — Ambulatory Visit (INDEPENDENT_AMBULATORY_CARE_PROVIDER_SITE_OTHER): Payer: Medicare PPO | Admitting: Medical

## 2017-03-18 ENCOUNTER — Telehealth: Payer: Self-pay | Admitting: Medical

## 2017-03-18 ENCOUNTER — Encounter: Payer: Self-pay | Admitting: Medical

## 2017-03-18 VITALS — BP 132/70 | HR 101 | Wt 111.8 lb

## 2017-03-18 DIAGNOSIS — T7840XA Allergy, unspecified, initial encounter: Secondary | ICD-10-CM | POA: Diagnosis not present

## 2017-03-18 DIAGNOSIS — N259 Disorder resulting from impaired renal tubular function, unspecified: Secondary | ICD-10-CM

## 2017-03-18 DIAGNOSIS — J45909 Unspecified asthma, uncomplicated: Secondary | ICD-10-CM | POA: Diagnosis not present

## 2017-03-18 DIAGNOSIS — I7300 Raynaud's syndrome without gangrene: Secondary | ICD-10-CM

## 2017-03-18 DIAGNOSIS — N183 Chronic kidney disease, stage 3 unspecified: Secondary | ICD-10-CM

## 2017-03-18 DIAGNOSIS — M9080 Osteopathy in diseases classified elsewhere, unspecified site: Secondary | ICD-10-CM | POA: Diagnosis not present

## 2017-03-18 DIAGNOSIS — J329 Chronic sinusitis, unspecified: Secondary | ICD-10-CM | POA: Diagnosis not present

## 2017-03-18 DIAGNOSIS — I129 Hypertensive chronic kidney disease with stage 1 through stage 4 chronic kidney disease, or unspecified chronic kidney disease: Secondary | ICD-10-CM | POA: Diagnosis not present

## 2017-03-18 DIAGNOSIS — D631 Anemia in chronic kidney disease: Secondary | ICD-10-CM | POA: Insufficient documentation

## 2017-03-18 DIAGNOSIS — E889 Metabolic disorder, unspecified: Secondary | ICD-10-CM | POA: Diagnosis not present

## 2017-03-18 DIAGNOSIS — N2889 Other specified disorders of kidney and ureter: Secondary | ICD-10-CM | POA: Insufficient documentation

## 2017-03-18 DIAGNOSIS — M349 Systemic sclerosis, unspecified: Secondary | ICD-10-CM | POA: Diagnosis not present

## 2017-03-18 DIAGNOSIS — K222 Esophageal obstruction: Secondary | ICD-10-CM | POA: Diagnosis not present

## 2017-03-18 DIAGNOSIS — M898X9 Other specified disorders of bone, unspecified site: Secondary | ICD-10-CM | POA: Insufficient documentation

## 2017-03-18 DIAGNOSIS — N189 Chronic kidney disease, unspecified: Secondary | ICD-10-CM | POA: Insufficient documentation

## 2017-03-18 MED ORDER — PREDNISONE 10 MG PO TABS: ORAL_TABLET | ORAL | 0 refills | Status: DC

## 2017-03-18 MED ORDER — CLARITHROMYCIN 500 MG PO TABS: 500.0000 mg | ORAL_TABLET | Freq: Two times a day (BID) | ORAL | 0 refills | Status: DC

## 2017-03-18 MED ORDER — DOXYCYCLINE HYCLATE 100 MG PO TABS: 100.0000 mg | ORAL_TABLET | Freq: Two times a day (BID) | ORAL | 0 refills | Status: DC

## 2017-03-18 NOTE — Progress Notes (Addendum)
Subjective: Chief Complaint  Patient presents with  . discuss to referral    discuss a referral  for rheum.   Here for referral  Medical team: Rheumatology - Dr. Kellie Simmering Dr. Allena Katz, [facility] Carollee Herter, MD here for primary care [facility] gynecology Dr. Marina Goodell and Dr. Christella Hartigan, GI Hasn't been seeing dentist and eye doctor  Here for need for updated referral.  Been seeing Dr. Kellie Simmering since 2003, but he is retiring and she needs referral to new rheumatologist.   Was seen here 3 weeks ago for sinus infection that hasn't cleared fully.   still has head pressure, mucoid drainage, cough, some wheezing  Uses cyproheptadine for allergies  Just saw nephrology and rheumatology in past 2 months, stable per her report  Past Medical History:  Diagnosis Date  . Allergic rhinitis   . Asthma   . CKD (chronic kidney disease), stage IV (HCC)   . Esophageal stricture   . GERD (gastroesophageal reflux disease)   . Hypertension   . Scleroderma [facility])     Past Surgical History:  Procedure Laterality Date  . CESAREAN SECTION    . COLONOSCOPY WITH PROPOFOL N/A 07/14/2016   Procedure: COLONOSCOPY WITH PROPOFOL;  Surgeon: Rachael Fee, MD;  Location: [facility] ENDOSCOPY;  Service: Endoscopy;  Laterality: N/A;  . HAND SURGERY     left; tendon repair  . TONSILLECTOMY      Social History   Social History  . Marital status: Single    Spouse name: N/A  . Number of children: N/A  . Years of education: N/A   Occupational History  . Not on file.   Social History Main Topics  . Smoking status: Never Smoker  . Smokeless tobacco: Never Used  . Alcohol use No  . Drug use: No  . Sexual activity: Not on file   Other Topics Concern  . Not on file   Social History Narrative  . No narrative on file    Family History  Problem Relation Age of Onset  . Heart failure Mother   . Hepatitis Mother     C  . Dementia Mother   . COPD Mother     emphysema  . Cancer Father     lung  with mets to brain  . Diabetes Sister   . Hepatitis Brother   . Cancer Sister     leukemia  . Colon cancer Neg Hx      Current Outpatient Prescriptions:  .  albuterol (PROAIR HFA) 108 (90 Base) MCG/ACT inhaler, Inhale 2 puffs into the lungs every 6 (six) hours as needed for wheezing., Disp: 1 Inhaler, Rfl: 0 .  amLODipine (NORVASC) 5 MG tablet, Take 5 mg by mouth 2 (two) times daily. , Disp: , Rfl:  .  budesonide-formoterol (SYMBICORT) 160-4.5 MCG/ACT inhaler, Inhale 2 puffs into the lungs 2 (two) times daily., Disp: 1 Inhaler, Rfl: 0 .  fluticasone (FLONASE) 50 MCG/ACT nasal spray, Place 1 spray into both nostrils daily as needed (for nasal congestion). Reported on 07/16/2016, Disp: , Rfl:  .  lisinopril (PRINIVIL,ZESTRIL) 5 MG tablet, Take 1 tablet (5 mg total) by mouth daily., Disp: , Rfl:  .  omeprazole (PRILOSEC) 20 MG capsule, Take 20 mg by mouth 2 (two) times daily.  , Disp: , Rfl:  .  pseudoephedrine (SUDAFED) 30 MG tablet, Take 30 mg by mouth every 8 (eight) hours as needed for congestion. Reported on 07/16/2016, Disp: , Rfl:  .  doxycycline (VIBRA-TABS) 100 MG tablet, Take 1 tablet (  100 mg total) by mouth 2 (two) times daily., Disp: 20 tablet, Rfl: 0 .  predniSONE (DELTASONE) 10 MG tablet, 2 tablets daily for 3 days, Disp: 6 tablet, Rfl: 0  Allergies  Allergen Reactions  . Avelox [Moxifloxacin Hcl In Nacl]     Caused tachycardia  . Codeine Hives and Other (See Comments)    hallucination  . Penicillins Hives    Has patient had a PCN reaction causing immediate rash, facial/tongue/throat swelling, SOB or lightheadedness with hypotension:NO Has patient had a PCN reaction causing severe rash involving mucus membranes or skin necrosis:UNSURE Has patient had a PCN reaction that required hospitalization:No Has patient had a PCN reaction occurring within the last 10 years:No If all of the above answers are "NO", then may proceed with Cephalosporin use.     Review of  Systems Constitutional: -fever, -chills, -sweats, -unexpected weight change,-fatigue Cardiology:  -chest pain, -palpitations, -edema Gastroenterology: -abdominal pain, -nausea, -vomiting, -diarrhea, -constipation Hematology: -bleeding or bruising problems Musculoskeletal: -arthralgias, -myalgias, -joint swelling, -back pain Ophthalmology: -vision changes Urology: -dysuria, -difficulty urinating, -hematuria, -urinary frequency, -urgency Neurology: -headache, -weakness, -tingling, -numbness     Objective: BP 132/70   Pulse (!) 101   Wt 111 lb 12.8 oz (50.7 kg)   BMI 19.19 kg/m   BP Readings from Last 3 Encounters:  03/18/17 132/70  02/26/17 110/70  11/13/16 (!) 98/50   Wt Readings from Last 3 Encounters:  03/18/17 111 lb 12.8 oz (50.7 kg)  02/26/17 111 lb 3.2 oz (50.4 kg)  11/13/16 111 lb (50.3 kg)   General appearance: Alert, WD/WN, no distress                             Skin: warm, no rash, no diaphoresis, tight appearing skin chronically around face, fingers, hands                           Head: no sinus tenderness                            Eyes: conjunctiva normal, corneas clear, PERRLA                            Ears: flat TMs, external ear canals normal                          Nose: septum midline, turbinates swollen, with erythema and mucoid discharge             Mouth/throat: MMM, tongue normal, mild pharyngeal erythema                           Neck: supple, no adenopathy, no thyromegaly, non tender                          Heart: RRR, normal S1, S2, no murmurs                         Lungs: +bronchial breath sounds, +scattered rhonchi, no wheezes, no rales                Extremities: no edema, non tender        Assessment: Encounter Diagnoses  Name Primary?  [name]  Scleroderma (HCC) Yes  . CRI (chronic renal insufficiency), stage 3 (moderate)   . Metabolic bone disease   . Intrinsic asthma   . Disorder resulting from impaired renal function   . ESOPHAGEAL  STRICTURE   . Allergic disorder, initial encounter   . Hypertensive nephropathy   . Anemia of renal disease   . Raynaud's disease without gangrene   . Chronic sinusitis, unspecified location      Plan: Discussed her concerns.  Will refer to Dr. Casimer LaniusAryal Govinda [facility] to establish since Dr. Kellie Simmeringruslow is retiring.    She has been stable per his notes.  Reviewed recent rheumatology notes.  Reviewed 01/2017 nephrology notes, stable. Due back 74mo f/u  reiterated our role as PCP, what to come here for, what to see specialists for  advised routine f/u with gynecology, eye doctor, dentist as she is due for all 3.   Sinusitis - begin Doxycycline and prednisone short term, rest, hydrate well, f/u if not resolving within a week  Reviewed recent labs in chart   F/u within 78mo for medicare wellness visit/physical   Caitlyn DavenportSandra was seen today for discuss to referral.  Diagnoses and all orders for this visit:  Scleroderma (HCC) -     Ambulatory referral to Rheumatology  CRI (chronic renal insufficiency), stage 3 (moderate) -     Ambulatory referral to Rheumatology  Metabolic bone disease  Intrinsic asthma  Disorder resulting from impaired renal function -     Ambulatory referral to Rheumatology  ESOPHAGEAL STRICTURE -     Ambulatory referral to Rheumatology  Allergic disorder, initial encounter  Hypertensive nephropathy  Anemia of renal disease  Raynaud's disease without gangrene  Chronic sinusitis, unspecified location  Other orders -     Discontinue: clarithromycin (BIAXIN) 500 MG tablet; Take 1 tablet (500 mg total) by mouth 2 (two) times daily. -     predniSONE (DELTASONE) 10 MG tablet; 2 tablets daily for 3 days -     doxycycline (VIBRA-TABS) 100 MG tablet; Take 1 tablet (100 mg total) by mouth 2 (two) times daily.

## 2017-03-18 NOTE — Addendum Note (Signed)
Addended by: Jac CanavanYSINGER, Bristal Steffy S on: 03/18/2017 12:38 PM   Modules accepted: Orders

## 2017-03-19 NOTE — Telephone Encounter (Signed)
error 

## 2017-03-24 ENCOUNTER — Telehealth: Payer: Self-pay

## 2017-03-24 ENCOUNTER — Other Ambulatory Visit: Payer: Self-pay | Admitting: Medical

## 2017-03-24 MED ORDER — ALBUTEROL SULFATE (2.5 MG/3ML) 0.083% IN NEBU: 2.5000 mg | INHALATION_SOLUTION | Freq: Four times a day (QID) | RESPIRATORY_TRACT | 2 refills | Status: DC | PRN

## 2017-03-24 NOTE — Telephone Encounter (Signed)
Pt states that she is feeling better, but still having cough and congestion. She has used an old albuterol via nebulizer with some relief. She would like to have a refill of the neb solution for albuterol. [facility]. CB [phone]. Trixie Rude/RLB

## 2017-03-24 NOTE — Telephone Encounter (Signed)
sent 

## 2017-04-15 DIAGNOSIS — R21 Rash and other nonspecific skin eruption: Secondary | ICD-10-CM | POA: Diagnosis not present

## 2017-04-15 DIAGNOSIS — M79643 Pain in unspecified hand: Secondary | ICD-10-CM | POA: Diagnosis not present

## 2017-04-15 DIAGNOSIS — I7300 Raynaud's syndrome without gangrene: Secondary | ICD-10-CM | POA: Diagnosis not present

## 2017-04-15 DIAGNOSIS — M349 Systemic sclerosis, unspecified: Secondary | ICD-10-CM | POA: Diagnosis not present

## 2017-04-26 ENCOUNTER — Emergency Department (HOSPITAL_COMMUNITY)
Admission: EM | Admit: 2017-04-26 | Discharge: 2017-04-26 | Disposition: A | Discharge status: Home / Self Care | Payer: Medicare PPO | Attending: Emergency Medicine | Admitting: Emergency Medicine

## 2017-04-26 ENCOUNTER — Encounter (HOSPITAL_COMMUNITY): Payer: Self-pay | Admitting: Emergency Medicine

## 2017-04-26 DIAGNOSIS — M79643 Pain in unspecified hand: Secondary | ICD-10-CM | POA: Diagnosis not present

## 2017-04-26 DIAGNOSIS — N184 Chronic kidney disease, stage 4 (severe): Secondary | ICD-10-CM | POA: Insufficient documentation

## 2017-04-26 DIAGNOSIS — J45909 Unspecified asthma, uncomplicated: Secondary | ICD-10-CM | POA: Diagnosis not present

## 2017-04-26 DIAGNOSIS — M349 Systemic sclerosis, unspecified: Secondary | ICD-10-CM | POA: Insufficient documentation

## 2017-04-26 DIAGNOSIS — L989 Disorder of the skin and subcutaneous tissue, unspecified: Secondary | ICD-10-CM | POA: Insufficient documentation

## 2017-04-26 DIAGNOSIS — L940 Localized scleroderma [morphea]: Secondary | ICD-10-CM | POA: Diagnosis not present

## 2017-04-26 DIAGNOSIS — I129 Hypertensive chronic kidney disease with stage 1 through stage 4 chronic kidney disease, or unspecified chronic kidney disease: Secondary | ICD-10-CM | POA: Insufficient documentation

## 2017-04-26 DIAGNOSIS — L089 Local infection of the skin and subcutaneous tissue, unspecified: Secondary | ICD-10-CM | POA: Diagnosis not present

## 2017-04-26 DIAGNOSIS — I7300 Raynaud's syndrome without gangrene: Secondary | ICD-10-CM | POA: Diagnosis not present

## 2017-04-26 DIAGNOSIS — M7989 Other specified soft tissue disorders: Secondary | ICD-10-CM | POA: Diagnosis present

## 2017-04-26 DIAGNOSIS — W57XXXA Bitten or stung by nonvenomous insect and other nonvenomous arthropods, initial encounter: Secondary | ICD-10-CM | POA: Diagnosis not present

## 2017-04-26 MED ORDER — SODIUM CHLORIDE 0.9 % IV BOLUS (SEPSIS)
1000.0000 mL | Freq: Once | INTRAVENOUS | Status: AC
  Administered 2017-04-26: 1000 mL via INTRAVENOUS

## 2017-04-26 MED ORDER — CLINDAMYCIN HCL 300 MG PO CAPS: 300.0000 mg | ORAL_CAPSULE | Freq: Three times a day (TID) | ORAL | 0 refills | Status: DC

## 2017-04-26 MED ORDER — LORAZEPAM 2 MG/ML IJ SOLN
0.5000 mg | Freq: Once | INTRAMUSCULAR | Status: AC
  Administered 2017-04-26: 0.5 mg via INTRAVENOUS
  Filled 2017-04-26: qty 1

## 2017-04-26 MED ORDER — MORPHINE SULFATE (PF) 4 MG/ML IV SOLN
4.0000 mg | Freq: Once | INTRAVENOUS | Status: AC
  Administered 2017-04-26: 4 mg via INTRAVENOUS
  Filled 2017-04-26: qty 1

## 2017-04-26 MED ORDER — MORPHINE SULFATE (PF) 4 MG/ML IV SOLN
4.0000 mg | Freq: Once | INTRAVENOUS | Status: AC
Start: 2017-04-26 — End: 2017-04-26
  Administered 2017-04-26: 4 mg via INTRAVENOUS
  Filled 2017-04-26: qty 1

## 2017-04-26 MED ORDER — HYDROCODONE-ACETAMINOPHEN 5-325 MG PO TABS: 1.0000 | ORAL_TABLET | ORAL | 0 refills | Status: DC | PRN

## 2017-04-26 MED ORDER — CLINDAMYCIN PHOSPHATE 600 MG/50ML IV SOLN
600.0000 mg | Freq: Once | INTRAVENOUS | Status: AC
  Administered 2017-04-26: 600 mg via INTRAVENOUS
  Filled 2017-04-26: qty 50

## 2017-04-26 NOTE — ED Notes (Signed)
Bed: WA08 Expected date:  Expected time:  Means of arrival:  Comments: 

## 2017-04-26 NOTE — ED Triage Notes (Signed)
Patient reports left index finger swelling, redness and pain that started couple days ago. Patient states that she has autoimmune disorder.

## 2017-04-26 NOTE — ED Notes (Signed)
Pt ambulatory and independent at discharge.  Verbalized understanding of discharge instructions 

## 2017-04-26 NOTE — ED Provider Notes (Signed)
[facility]-EMERGENCY DEPT Provider Note   CSN: [id number] Arrival date & time: 04/26/17  1648  By signing my name below, I, [name], attest that this documentation has been prepared under the direction and in the presence of physician practitioner, Raeford Razor, MD. Electronically Signed: Linna [name], Scribe. 04/26/2017. 5:42 PM.  History   Chief Complaint Chief Complaint  Patient presents with  . hand swellling   The history is provided by the patient. No language interpreter was used.    HPI Comments: [name] is a 61 y.o. female with PMHx including CKD, GERD, HTN, scleroderma and asthma who presents to the Emergency Department complaining of constant, gradually worsening pain to the distal aspect of her left index finger beginning four days ago. She reports associated swelling and redness. She states her pain is worse with applied pressure to her left index finger. Patient states her current symptoms are consistent with her h/o scleroderma. She reports recurrent finger ulcers and has not had surgical intervention on her fingers in the past. She has listed allergies to codeine and penicillins. Patient notes that Percocet has provided good improvement of similar pain in the past. She denies numbness/tingling, focal weakness, or any other associated symptoms.  Past Medical History:  Diagnosis Date  . Allergic rhinitis   . Asthma   . CKD (chronic kidney disease), stage IV (HCC)   . Esophageal stricture   . GERD (gastroesophageal reflux disease)   . Hypertension   . Scleroderma [facility])     Patient Active Problem List   Diagnosis Date Noted  . CRI (chronic renal insufficiency), stage 3 (moderate) 03/18/2017  . Hypertensive nephropathy 03/18/2017  . Allergic 03/18/2017  . Metabolic bone disease 03/18/2017  . Anemia of renal disease 03/18/2017  . Raynaud's disease without gangrene 03/18/2017  . Hyponatremia with decreased serum osmolality 07/11/2016  . Clostridium difficile  diarrhea 07/11/2016  . Hyponatremia 07/11/2016  . Colitis 06/24/2016  . GERD (gastroesophageal reflux disease) 12/04/2011  . Chronic sinusitis 11/18/2011  . Disorder resulting from impaired renal function 12/30/2009  . 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 Scleroderma (HCC) 06/22/2008  . WEIGHT LOSS-ABNORMAL 06/22/2008  . ESOPHAGEAL STRICTURE 07/23/2006  . HIATAL HERNIA 07/23/2006    Past Surgical History:  Procedure Laterality Date  . CESAREAN SECTION    . COLONOSCOPY WITH PROPOFOL N/A 07/14/2016   Procedure: COLONOSCOPY WITH PROPOFOL;  Surgeon: Rachael Fee, MD;  Location: [facility] ENDOSCOPY;  Service: Endoscopy;  Laterality: N/A;  . HAND SURGERY     left; tendon repair  . TONSILLECTOMY      OB History    No data available       Home Medications    Prior to Admission medications   Medication Sig Start Date End Date Taking? Authorizing Provider  albuterol (PROAIR HFA) 108 (90 Base) MCG/ACT inhaler Inhale 2 puffs into the lungs every 6 (six) hours as needed for wheezing. 11/13/16 04/08/19  Kermit Balo Tysinger, PA-C  albuterol (PROVENTIL) (2.5 MG/3ML) 0.083% nebulizer solution Take 3 mLs (2.5 mg total) by nebulization every 6 (six) hours as needed for wheezing or shortness of breath. 03/24/17   Kermit Balo Tysinger, PA-C  amLODipine (NORVASC) 5 MG tablet Take 5 mg by mouth 2 (two) times daily.  06/05/16   Historical Provider, MD  budesonide-formoterol (SYMBICORT) 160-4.5 MCG/ACT inhaler Inhale 2 puffs into the lungs 2 (two) times daily. 03/03/17   Kermit Balo Tysinger, PA-C  doxycycline (VIBRA-TABS) 100 MG tablet Take 1 tablet (100 mg total) by mouth 2 (two)  times daily. 03/18/17   Kermit Balo Tysinger, PA-C  fluticasone (FLONASE) 50 MCG/ACT nasal spray Place 1 spray into both nostrils daily as needed (for nasal congestion). Reported on 07/16/2016    Historical Provider, MD  lisinopril (PRINIVIL,ZESTRIL) 5 MG tablet Take 1 tablet (5 mg total) by mouth daily. 06/28/16   Leroy Sea, MD  omeprazole (PRILOSEC)  20 MG capsule Take 20 mg by mouth 2 (two) times daily.      Historical Provider, MD  predniSONE (DELTASONE) 10 MG tablet 2 tablets daily for 3 days 03/18/17   Kermit Balo Tysinger, PA-C  pseudoephedrine (SUDAFED) 30 MG tablet Take 30 mg by mouth every 8 (eight) hours as needed for congestion. Reported on 07/16/2016    Historical Provider, MD    Family History Family History  Problem Relation Age of Onset  . Heart failure Mother   . Hepatitis Mother     C  . Dementia Mother   . COPD Mother     emphysema  . Cancer Father     lung with mets to brain  . Diabetes Sister   . Hepatitis Brother   . Cancer Sister     leukemia  . Colon cancer Neg Hx     Social History Social History  Substance Use Topics  . Smoking status: Never Smoker  . Smokeless tobacco: Never Used  . Alcohol use No     Allergies   Avelox [moxifloxacin hcl in nacl]; Codeine; and Penicillins   Review of Systems Review of Systems  Musculoskeletal: Positive for arthralgias and joint swelling.  Skin: Positive for color change.  Allergic/Immunologic: Positive for immunocompromised state.  Neurological: Negative for weakness and numbness.  All other systems reviewed and are negative.  Physical Exam Updated Vital Signs BP 119/66 (BP Location: Left Arm)   Pulse 61   Temp 98 F (36.7 C) (Oral)   Resp 17   Ht  (1.6 m)   Wt 113 lb (51.3 kg)   SpO2 97%   BMI 20.02 kg/m   Physical Exam  Constitutional: She appears well-developed and well-nourished.  HENT:  Head: Normocephalic.  Right Ear: External ear normal.  Left Ear: External ear normal.  Nose: Nose normal.  Mouth/Throat: Oropharynx is clear and moist.  Eyes: Conjunctivae are normal. Right eye exhibits no discharge. Left eye exhibits no discharge.  Neck: Normal range of motion.  Cardiovascular: Regular rhythm and normal heart sounds.  Tachycardia present.   No murmur heard. Pulmonary/Chest: Effort normal and breath sounds normal. No respiratory  distress. She has no wheezes. She has no rales.  Abdominal: Soft. She exhibits no distension. There is no tenderness. There is no rebound and no guarding.  Musculoskeletal: Normal range of motion. She exhibits tenderness.  Chronic appearing changes to multiple fingers consistent with her history of scleroderma.   Left index finger: Diffuse swelling. Erythema extending into dorsum of the left hand. Can range interphalangeal and MP joint. Small draining ulcer at the fingertip. Finger pulp is very tender but soft.  Neurological: She is alert. No cranial nerve deficit. Coordination normal.  Skin: Skin is warm and dry. No rash noted. There is erythema. No pallor.  Psychiatric: She has a normal mood and affect. Her behavior is normal.  Nursing note and vitals reviewed.  ED Treatments / Results  Labs (all labs ordered are listed, but only abnormal results are displayed) Labs Reviewed - No data to display  EKG  EKG Interpretation None       Radiology  No results found.  Procedures Procedures (including critical care time)  DIAGNOSTIC STUDIES: Oxygen Saturation is 97% on RA, normal by my interpretation.    COORDINATION OF CARE: 5:48 PM Discussed treatment plan with pt at bedside and pt agreed to plan.  Medications Ordered in ED Medications - No data to display   Initial Impression / Assessment and Plan / ED Course  I have reviewed the triage vital signs and the nursing notes.  Pertinent labs & imaging results that were available during my care of the patient were reviewed by me and considered in my medical decision making (see chart for details).     60yF with L index finger pain/swelling. No trauma. Likely infected ulceration which resulted from her underlying scleroderma. Draining spontaneously. Can actively range IP and MP joints. Finger tip is not tense. Afebrile. Nontoxic. At this time, I do not feel she needs an emergent hand surgical consultation. Will place on abx. PRN  pain meds. Return precautions.   Final Clinical Impressions(s) / ED Diagnoses   Final diagnoses:  Finger infection  Scleroderma (HCC)    New Prescriptions New Prescriptions   No medications on file   I personally preformed the services scribed in my presence. The recorded information has been reviewed is accurate. Raeford Razor, MD.    Raeford Razor, MD 04/26/17 2029

## 2017-05-04 ENCOUNTER — Telehealth: Payer: Self-pay | Admitting: Medical

## 2017-05-04 MED ORDER — BUDESONIDE-FORMOTEROL FUMARATE 160-4.5 MCG/ACT IN AERO: 2.0000 | INHALATION_SPRAY | Freq: Two times a day (BID) | RESPIRATORY_TRACT | 0 refills | Status: DC

## 2017-05-04 NOTE — Telephone Encounter (Signed)
Pt called for sample Symbicort 160/4.5 #1 given

## 2017-05-05 ENCOUNTER — Encounter: Payer: Self-pay | Admitting: Medical

## 2017-05-05 ENCOUNTER — Ambulatory Visit (INDEPENDENT_AMBULATORY_CARE_PROVIDER_SITE_OTHER): Payer: Medicare PPO | Admitting: Medical

## 2017-05-05 VITALS — BP 126/80 | HR 82 | Temp 98.2°F

## 2017-05-05 DIAGNOSIS — L98499 Non-pressure chronic ulcer of skin of other sites with unspecified severity: Secondary | ICD-10-CM

## 2017-05-05 DIAGNOSIS — M349 Systemic sclerosis, unspecified: Secondary | ICD-10-CM | POA: Diagnosis not present

## 2017-05-05 DIAGNOSIS — I7300 Raynaud's syndrome without gangrene: Secondary | ICD-10-CM

## 2017-05-05 NOTE — Progress Notes (Signed)
Subjective: Chief Complaint  Patient presents with  . possible infection on finger    possible infection on index lt finger    Here for finger infection.  She has hx/o scleroderma, raynaud's and gets finger ulceration from time to time.  Has had pus drainage occasionally but only 1 other time has she had a more significant infection. However, she went to the emergency dept 04/26/17 for red swollen pus draining left index finger.   Was given IV antibiotic, sent home on Clindamycin.  Was taking this but it was tearing up her stomach, pain, cramping, loose stool.  The abdominal symptoms resolved since she finished clindamycin.  Finger is improved but not fully healed.   No other aggravating or relieving factors. No other complaint.  Past Medical History:  Diagnosis Date  . Allergic rhinitis   . Asthma   . CKD (chronic kidney disease), stage IV (HCC)   . Esophageal stricture   . GERD (gastroesophageal reflux disease)   . Hypertension   . Scleroderma [facility])    Current Outpatient Prescriptions on File Prior to Visit  Medication Sig Dispense Refill  . acetaminophen (TYLENOL) 500 MG tablet Take 1,000 mg by mouth every 6 (six) hours as needed for mild pain.    [name] amLODipine (NORVASC) 5 MG tablet Take 5 mg by mouth 2 (two) times daily.     . budesonide-formoterol (SYMBICORT) 160-4.5 MCG/ACT inhaler Inhale 2 puffs into the lungs 2 (two) times daily. 1 Inhaler 0  . fluticasone (FLONASE) 50 MCG/ACT nasal spray Place 1 spray into both nostrils daily as needed (for nasal congestion). Reported on 07/16/2016    . HYDROcodone-acetaminophen (NORCO/VICODIN) 5-325 MG tablet Take 1-2 tablets by mouth every 4 (four) hours as needed. 12 tablet 0  . lisinopril (PRINIVIL,ZESTRIL) 5 MG tablet Take 1 tablet (5 mg total) by mouth daily.    [name] omeprazole (PRILOSEC) 20 MG capsule Take 20 mg by mouth 2 (two) times daily.      . pseudoephedrine (SUDAFED) 30 MG tablet Take 30 mg by mouth every 8 (eight) hours as needed for  congestion. Reported on 07/16/2016    . albuterol (PROAIR HFA) 108 (90 Base) MCG/ACT inhaler Inhale 2 puffs into the lungs every 6 (six) hours as needed for wheezing. (Patient not taking: Reported on 05/05/2017) 1 Inhaler 0  . [DISCONTINUED] famotidine (PEPCID) 20 MG tablet Take 20 mg by mouth 2 (two) times daily.       No current facility-administered medications on file prior to visit.    ROS as in subjective Constitutional: -fever, -chills, -sweats, -unexpected weight change,-fatigue Cardiology:  -chest pain, -palpitations, -edema Respiratory: -cough, -shortness of breath, -wheezing Gastroenterology: -abdominal pain, -nausea, -vomiting, -diarrhea, -constipation Hematology: -bleeding or bruising problems Neurology: -headache, -weakness, -tingling, -numbness     Objective: BP 126/80   Pulse 82   Temp 98.2 F (36.8 C)   gen: wd, wn, nad Skin: left index finger distal phalanx with somewhat tense tissue, but no warmth of specific induration, there is opening of wound distal phalanx end, there is a small amount of pus coming out the opening, no gangrene Finger otherwise neurovascularly intact There is some scarring of other digital fingertips from prior similar ulcerations No red streaking, no other abscess   Assessment: Encounter Diagnoses  Name Primary?  . Ulcer of finger, unspecified ulcer stage (HCC) Yes  . Raynaud's disease without gangrene   . Scleroderma (HCC)       Plan: She has a whole 8-10 day course of doxycycline  oral antibiotic with her today from recent script for tick bite she hasn't taken.  advised she STOP clindamycin as she has done already, begin doxycyline, begin samples of probiotic Align given in office for 1 week supply, and keep finger clean and dry.   If not much improved within 10-14 days recheck, or if worse in the meantime as discussed return sooner.  otherwise discussed with rheumatology at next visit other options for recurrent digital ulcers.   [name]  was seen today for possible infection on finger.  Diagnoses and all orders for this visit:  Ulcer of finger, unspecified ulcer stage (HCC)  Raynaud's disease without gangrene  Scleroderma (HCC)

## 2017-05-27 ENCOUNTER — Telehealth: Payer: Self-pay | Admitting: Medical

## 2017-05-27 MED ORDER — BUDESONIDE-FORMOTEROL FUMARATE 160-4.5 MCG/ACT IN AERO: 2.0000 | INHALATION_SPRAY | Freq: Two times a day (BID) | RESPIRATORY_TRACT | 0 refills | Status: DC

## 2017-05-27 NOTE — Telephone Encounter (Signed)
Pt called for samples of Symbicort, sample given

## 2017-06-28 ENCOUNTER — Telehealth: Payer: Self-pay | Admitting: Medical

## 2017-06-28 MED ORDER — BUDESONIDE-FORMOTEROL FUMARATE 160-4.5 MCG/ACT IN AERO
2.0000 | INHALATION_SPRAY | Freq: Two times a day (BID) | RESPIRATORY_TRACT | 0 refills | Status: DC
Start: 2017-06-28 — End: 2017-07-29

## 2017-06-28 NOTE — Telephone Encounter (Signed)
Pt called for her Symbicort sample

## 2017-07-29 ENCOUNTER — Other Ambulatory Visit: Payer: Self-pay | Admitting: Family Medicine

## 2017-07-29 MED ORDER — BUDESONIDE-FORMOTEROL FUMARATE 160-4.5 MCG/ACT IN AERO: 2.0000 | INHALATION_SPRAY | Freq: Two times a day (BID) | RESPIRATORY_TRACT | 0 refills | Status: DC

## 2017-08-16 ENCOUNTER — Other Ambulatory Visit: Payer: Self-pay

## 2017-08-16 ENCOUNTER — Telehealth: Payer: Self-pay | Admitting: Medical

## 2017-08-16 MED ORDER — BUDESONIDE-FORMOTEROL FUMARATE 160-4.5 MCG/ACT IN AERO: 2.0000 | INHALATION_SPRAY | Freq: Two times a day (BID) | RESPIRATORY_TRACT | 12 refills | Status: DC

## 2017-08-16 NOTE — Telephone Encounter (Signed)
Called and spoke with pt she will p/u sample on 08/17/2017

## 2017-08-16 NOTE — Telephone Encounter (Signed)
Pt called for samples of symbicort 160 4.5. Please call pt at [phone] when ready. Ok to leave message.

## 2017-08-19 DIAGNOSIS — M79643 Pain in unspecified hand: Secondary | ICD-10-CM | POA: Diagnosis not present

## 2017-08-19 DIAGNOSIS — I7300 Raynaud's syndrome without gangrene: Secondary | ICD-10-CM | POA: Diagnosis not present

## 2017-08-19 DIAGNOSIS — M349 Systemic sclerosis, unspecified: Secondary | ICD-10-CM | POA: Diagnosis not present

## 2017-08-19 DIAGNOSIS — N189 Chronic kidney disease, unspecified: Secondary | ICD-10-CM | POA: Diagnosis not present

## 2017-09-01 ENCOUNTER — Other Ambulatory Visit: Payer: Self-pay | Admitting: Rheumatology

## 2017-09-01 DIAGNOSIS — I272 Pulmonary hypertension, unspecified: Secondary | ICD-10-CM

## 2017-09-06 ENCOUNTER — Other Ambulatory Visit (HOSPITAL_COMMUNITY): Payer: Commercial Managed Care - HMO

## 2017-09-15 ENCOUNTER — Telehealth: Payer: Self-pay | Admitting: Medical

## 2017-09-15 MED ORDER — BUDESONIDE-FORMOTEROL FUMARATE 160-4.5 MCG/ACT IN AERO
2.0000 | INHALATION_SPRAY | Freq: Two times a day (BID) | RESPIRATORY_TRACT | 0 refills | Status: DC
Start: 2017-09-15 — End: 2017-10-20

## 2017-09-15 NOTE — Telephone Encounter (Signed)
Pt called her samples Symbicort 160/4.5, sample given

## 2017-09-17 ENCOUNTER — Other Ambulatory Visit: Payer: Self-pay

## 2017-09-17 ENCOUNTER — Ambulatory Visit (HOSPITAL_COMMUNITY): Payer: Medicare PPO | Attending: Cardiology

## 2017-09-17 DIAGNOSIS — M349 Systemic sclerosis, unspecified: Secondary | ICD-10-CM | POA: Insufficient documentation

## 2017-09-17 DIAGNOSIS — I361 Nonrheumatic tricuspid (valve) insufficiency: Secondary | ICD-10-CM | POA: Insufficient documentation

## 2017-09-17 DIAGNOSIS — J45909 Unspecified asthma, uncomplicated: Secondary | ICD-10-CM | POA: Diagnosis not present

## 2017-09-17 DIAGNOSIS — I272 Pulmonary hypertension, unspecified: Secondary | ICD-10-CM | POA: Diagnosis not present

## 2017-09-17 DIAGNOSIS — N189 Chronic kidney disease, unspecified: Secondary | ICD-10-CM | POA: Insufficient documentation

## 2017-09-28 DIAGNOSIS — N183 Chronic kidney disease, stage 3 (moderate): Secondary | ICD-10-CM | POA: Diagnosis not present

## 2017-09-28 DIAGNOSIS — E889 Metabolic disorder, unspecified: Secondary | ICD-10-CM | POA: Diagnosis not present

## 2017-09-28 DIAGNOSIS — N189 Chronic kidney disease, unspecified: Secondary | ICD-10-CM | POA: Diagnosis not present

## 2017-10-07 DIAGNOSIS — I10 Essential (primary) hypertension: Secondary | ICD-10-CM | POA: Diagnosis not present

## 2017-10-07 DIAGNOSIS — N183 Chronic kidney disease, stage 3 (moderate): Secondary | ICD-10-CM | POA: Diagnosis not present

## 2017-10-07 DIAGNOSIS — R300 Dysuria: Secondary | ICD-10-CM | POA: Diagnosis not present

## 2017-10-07 DIAGNOSIS — M9080 Osteopathy in diseases classified elsewhere, unspecified site: Secondary | ICD-10-CM | POA: Diagnosis not present

## 2017-10-07 DIAGNOSIS — E889 Metabolic disorder, unspecified: Secondary | ICD-10-CM | POA: Diagnosis not present

## 2017-10-07 DIAGNOSIS — D631 Anemia in chronic kidney disease: Secondary | ICD-10-CM | POA: Diagnosis not present

## 2017-10-07 DIAGNOSIS — M349 Systemic sclerosis, unspecified: Secondary | ICD-10-CM | POA: Diagnosis not present

## 2017-10-20 ENCOUNTER — Telehealth: Payer: Self-pay | Admitting: Medical

## 2017-10-20 MED ORDER — BUDESONIDE-FORMOTEROL FUMARATE 160-4.5 MCG/ACT IN AERO: 2.0000 | INHALATION_SPRAY | Freq: Two times a day (BID) | RESPIRATORY_TRACT | 0 refills | Status: DC

## 2017-10-20 NOTE — Telephone Encounter (Signed)
Left message for pt, needs appt 

## 2017-10-20 NOTE — Telephone Encounter (Signed)
Pt called for samples of Symbicort #2 given

## 2017-11-23 ENCOUNTER — Telehealth: Payer: Self-pay | Admitting: Medical

## 2017-11-23 MED ORDER — BUDESONIDE-FORMOTEROL FUMARATE 160-4.5 MCG/ACT IN AERO: 2.0000 | INHALATION_SPRAY | Freq: Two times a day (BID) | RESPIRATORY_TRACT | 0 refills | Status: DC

## 2017-11-23 NOTE — Telephone Encounter (Signed)
Pt called for samples of Symbicort, she has appt for med check in December.  #2 given

## 2017-12-14 ENCOUNTER — Encounter: Payer: Self-pay | Admitting: Medical

## 2017-12-14 ENCOUNTER — Ambulatory Visit: Payer: Medicare PPO | Admitting: Medical

## 2017-12-14 VITALS — BP 110/60 | HR 68 | Ht 64.0 in | Wt 112.6 lb

## 2017-12-14 DIAGNOSIS — J4540 Moderate persistent asthma, uncomplicated: Secondary | ICD-10-CM

## 2017-12-14 DIAGNOSIS — J45909 Unspecified asthma, uncomplicated: Secondary | ICD-10-CM | POA: Diagnosis not present

## 2017-12-14 MED ORDER — ALBUTEROL SULFATE HFA 108 (90 BASE) MCG/ACT IN AERS: 2.0000 | INHALATION_SPRAY | Freq: Four times a day (QID) | RESPIRATORY_TRACT | 1 refills | Status: DC | PRN

## 2017-12-14 MED ORDER — PREDNISONE 10 MG PO TABS: ORAL_TABLET | ORAL | 0 refills | Status: DC

## 2017-12-14 MED ORDER — AZITHROMYCIN 250 MG PO TABS: ORAL_TABLET | ORAL | 0 refills | Status: DC

## 2017-12-14 NOTE — Progress Notes (Signed)
Subjective:  [name] is a 61 y.o. female who presents for cough and illness.    Started a week ago with sore throat, drainage, cough, some wheezing, SOB,  Head and chest congestion.   Using Flonase, sudafed.  Using breathing treatment.   Daughter was sick a little over a week ago.   No fever. No other aggravating or relieving factors.  No other c/o.  The following portions of the patient's history were reviewed and updated as appropriate: allergies, current medications, past family history, past medical history, past social history, past surgical history and problem list.  ROS as in subjective  Past Medical History:  Diagnosis Date  . Allergic rhinitis   . Asthma   . CKD (chronic kidney disease), stage IV (HCC)   . Esophageal stricture   . GERD (gastroesophageal reflux disease)   . Hypertension   . Scleroderma (HCC)    Current Outpatient Medications on File Prior to Visit  Medication Sig Dispense Refill  . acetaminophen (TYLENOL) 500 MG tablet Take 1,000 mg by mouth every 6 (six) hours as needed for mild pain.    [name]. amLODipine (NORVASC) 5 MG tablet Take 5 mg by mouth 2 (two) times daily.     . budesonide-formoterol (SYMBICORT) 160-4.5 MCG/ACT inhaler Inhale 2 puffs into the lungs 2 (two) times daily. 2 Inhaler 0  . fluticasone (FLONASE) 50 MCG/ACT nasal spray Place 1 spray into both nostrils daily as needed (for nasal congestion). Reported on 07/16/2016    . HYDROcodone-acetaminophen (NORCO/VICODIN) 5-325 MG tablet Take 1-2 tablets by mouth every 4 (four) hours as needed. 12 tablet 0  . lisinopril (PRINIVIL,ZESTRIL) 5 MG tablet Take 1 tablet (5 mg total) by mouth daily.    [name]. omeprazole (PRILOSEC) 20 MG capsule Take 20 mg by mouth 2 (two) times daily.      . pseudoephedrine (SUDAFED) 30 MG tablet Take 30 mg by mouth every 8 (eight) hours as needed for congestion. Reported on 07/16/2016    . [DISCONTINUED] famotidine (PEPCID) 20 MG tablet Take 20 mg by mouth 2 (two) times daily.         No current facility-administered medications on file prior to visit.    ROS as in subjective  Objective: BP 110/60 (BP Location: Right Arm, Patient Position: Sitting)   Pulse 68   Ht 5\' 4"  (1.626 m)   Wt 112 lb 9.6 oz (51.1 kg)   BMI 19.33 kg/m   General appearance: Alert, WD/WN, no distress, somewhat ill appearing                             Skin: warm, no rash, no diaphoresis                           Head: no sinus tenderness                            Eyes: conjunctiva normal, corneas clear, PERRLA                            Ears: pearly TMs, external ear canals normal                          Nose: septum midline, turbinates swollen, with erythema and clear discharge  Mouth/throat: MMM, tongue normal, no pharyngeal erythema                           Neck: supple, no adenopathy, no thyromegaly, nontender                          Heart: RRR, normal S1, S2, no murmurs                         Lungs: +bronchial breath sounds, +scattered rhonchi, + wheezes, no rales                Extremities: no edema, nontender      Assessment: Encounter Diagnoses  Name Primary?  . Intrinsic asthma Yes  . 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   If not seeing significant improvement in the next 48 hours, then recheck.  She will also call insurer about their preferred preventative inhaler since they don't cover Symbicort.   I gave sample today.  [name] was seen today for acute visit.  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

## 2017-12-23 ENCOUNTER — Encounter: Payer: Self-pay | Admitting: Medical

## 2017-12-23 ENCOUNTER — Ambulatory Visit: Payer: Medicare PPO | Admitting: Medical

## 2017-12-23 VITALS — BP 110/68 | HR 83 | Wt 111.2 lb

## 2017-12-23 DIAGNOSIS — Z1231 Encounter for screening mammogram for malignant neoplasm of breast: Secondary | ICD-10-CM | POA: Diagnosis not present

## 2017-12-23 DIAGNOSIS — D631 Anemia in chronic kidney disease: Secondary | ICD-10-CM

## 2017-12-23 DIAGNOSIS — K449 Diaphragmatic hernia without obstruction or gangrene: Secondary | ICD-10-CM | POA: Diagnosis not present

## 2017-12-23 DIAGNOSIS — I7300 Raynaud's syndrome without gangrene: Secondary | ICD-10-CM | POA: Diagnosis not present

## 2017-12-23 DIAGNOSIS — K219 Gastro-esophageal reflux disease without esophagitis: Secondary | ICD-10-CM

## 2017-12-23 DIAGNOSIS — J45909 Unspecified asthma, uncomplicated: Secondary | ICD-10-CM | POA: Diagnosis not present

## 2017-12-23 DIAGNOSIS — E889 Metabolic disorder, unspecified: Secondary | ICD-10-CM | POA: Diagnosis not present

## 2017-12-23 DIAGNOSIS — I7090 Unspecified atherosclerosis: Secondary | ICD-10-CM

## 2017-12-23 DIAGNOSIS — M898X9 Other specified disorders of bone, unspecified site: Secondary | ICD-10-CM

## 2017-12-23 DIAGNOSIS — Z7185 Encounter for immunization safety counseling: Secondary | ICD-10-CM

## 2017-12-23 DIAGNOSIS — N183 Chronic kidney disease, stage 3 unspecified: Secondary | ICD-10-CM

## 2017-12-23 DIAGNOSIS — Z8719 Personal history of other diseases of the digestive system: Secondary | ICD-10-CM

## 2017-12-23 DIAGNOSIS — M349 Systemic sclerosis, unspecified: Secondary | ICD-10-CM | POA: Diagnosis not present

## 2017-12-23 DIAGNOSIS — R9389 Abnormal findings on diagnostic imaging of other specified body structures: Secondary | ICD-10-CM | POA: Diagnosis not present

## 2017-12-23 DIAGNOSIS — M9080 Osteopathy in diseases classified elsewhere, unspecified site: Secondary | ICD-10-CM

## 2017-12-23 DIAGNOSIS — N189 Chronic kidney disease, unspecified: Secondary | ICD-10-CM

## 2017-12-23 DIAGNOSIS — Z1239 Encounter for other screening for malignant neoplasm of breast: Secondary | ICD-10-CM

## 2017-12-23 DIAGNOSIS — N2889 Other specified disorders of kidney and ureter: Secondary | ICD-10-CM

## 2017-12-23 DIAGNOSIS — J329 Chronic sinusitis, unspecified: Secondary | ICD-10-CM

## 2017-12-23 DIAGNOSIS — Z7189 Other specified counseling: Secondary | ICD-10-CM

## 2017-12-23 NOTE — Patient Instructions (Addendum)
Your diagnosis today includes: Encounter Diagnoses  Name Primary?  . Intrinsic asthma Yes  . Raynaud's disease without gangrene   . Scleroderma (HCC)   . CRI (chronic renal insufficiency), stage 3 (moderate) (HCC)   . Anemia of renal disease   . Metabolic bone disease   . Gastroesophageal reflux disease without esophagitis   . History of colitis   . Chronic sinusitis, unspecified location   . Hiatal hernia   . History of ischemic colitis   . Screening for breast cancer   . Abnormal CT scan, chest   . Vaccine counseling   . Atherosclerosis    I am providing these recommendations below as part of preventative care recommendations since we haven't seen you for a full physical in a while.  Recommendations:  Diet and exercise  Exercise most days per week, at least 30 minutes or more Foods to avoid or limit - fried foods, high sugar foods, white bread, enriched flour, fast food, red meat, large amounts of cheese, processed foods such as little debbie cakes, cookies, pies, donuts, for example   Foods to include in the diet - whole grains such as whole grain pasta, whole grain bread, barley, steel cut oatmeal (not instant oatmeal), avocado, fish, green leafy vegetables, nuts, increased fiber in diet, and using olive oil in small amounts for cooking or as salad dressing vinaigrette.    Preventative care  See your eye doctor yearly for routine vision care.  See your dentist yearly for routine dental care including hygiene visits twice yearly.  You are way past due for mammogram and pap smear.    You can either be referred back to gynecology for this, or you can see one of us here at [facility] for this, but either way you are due now for this!  Call and schedule a mammogram.  I gave you the contact info on this today  I recommend you have a bone density scan for bone health evaluation  I recommend you take Vitamin D 1000 IU daily OTC  I recommend you get 1200mg   calcium either from oral supplement or 4 servings of dairy daily  Vaccines:  I recommend you have a Shingles Vaccine to help prevent shingles or herpes zoster outbreak.   Please call your insurer to inquire about coverage for the Shingrix vaccine given in 2 doses.   Some insurers cover this vaccine after age 60, some cover this after age 60.  If your insurer covers this, then call to schedule appointment to have this vaccine here.  I recommend a yearly Influenza/Flu vaccine, typically in September to help reduce the risk of you and others getting the flu illness  I recommend the Prevnar 13/pneumococcal vaccine as well.  You have had the Pneumococcal 23 vaccine, but given your history of asthma, I recommend you also have the Prevenar 13 vaccine.  Please check insurance coverage for this.  Specific medical conditions:  Asthma and abnormal lung findings  Your CT scan from last year showed emphysematous changes in your lungs  I recommend you have a full pulmonary function test (PFT).   This can either be done at [facility] where we schedule this, or we can refer you to pulmonology for consult for this by the pulmonologist  I also recommend an updated chest xray.  This has been ordered.  [facility] Monday - Friday, 8am - 4: 30pm for this test.   Chronic Kidney Disease and Anemia of  kidney disease  Continue routine follow up with [facility] for chronic kidney disease as usual  Scleroderma, Raynauds  Continue routine follow up with your rheumatologist  Atherosclerosis/cholesterol buildup in your arteries  This was noted on CT scan of pelvis this past year  I recommend you come in for fasting cholesterol lab at your convenience  I also recommend you begin Pravachol at bedtime daily to lower your cholesterol and reduce risk of heart disease  We recommend you see us yearly for a physical

## 2017-12-23 NOTE — Progress Notes (Signed)
Subjective: Chief Complaint  Patient presents with  . med check    med check    Here for med check.   I had requested a physical, but she declined. Of note, I have seen Mrs. Rodrigues for years for acute visits and she has been a patient at this practice with Dr. Susann Givens for years, but has never come in the last 5+ years for preventative care/physical although we have requested this.   This was suppose to be the reason for the visit today.  Medical team:  Dr. Casimer Lanius, rheumatology Dr. Zetta Bills, nephrology at [facility] Not seeing eye doctor and dentist [name], PA-C and Dr. Sharlot Gowda here for primary care In the past has seen gynecology  She is mainly here for f/u on asthma.  She notes much improvement since last visit for asthma exacerbation.   Nebulized albuterol at home helps but also makes her jittery.   She is still using Symbicort BID and mainly relying on samples from [place].  She says she called her insurance company since last visit, and they are suppose to call her back about insurance formulary for preventative inhalers since they don't cover Symbicort.  She has no other c/o today and says she is in a hurry.   Past Medical History:  Diagnosis Date  . Allergic rhinitis   . Asthma   . Atherosclerosis   . Chronic kidney disease (CKD), stage III (moderate) (HCC)   . Esophageal stricture   . GERD (gastroesophageal reflux disease)   . History of anemia due to chronic kidney disease   . History of colitis 2017  . Hypertension   . Metabolic bone disease   . Raynaud disease   . Scleroderma (HCC)    Current Outpatient Medications on File Prior to Visit  Medication Sig Dispense Refill  . acetaminophen (TYLENOL) 500 MG tablet Take 1,000 mg by mouth every 6 (six) hours as needed for mild pain.    [name] albuterol (PROAIR HFA) 108 (90 Base) MCG/ACT inhaler Inhale 2 puffs into the lungs every 6 (six) hours as needed for wheezing. 1 Inhaler 1  . amLODipine (NORVASC) 5 MG  tablet Take 5 mg by mouth 2 (two) times daily.     . budesonide-formoterol (SYMBICORT) 160-4.5 MCG/ACT inhaler Inhale 2 puffs into the lungs 2 (two) times daily. 2 Inhaler 0  . fluticasone (FLONASE) 50 MCG/ACT nasal spray Place 1 spray into both nostrils daily as needed (for nasal congestion). Reported on 07/16/2016    . lisinopril (PRINIVIL,ZESTRIL) 5 MG tablet Take 1 tablet (5 mg total) by mouth daily.    [name] omeprazole (PRILOSEC) 20 MG capsule Take 20 mg by mouth 2 (two) times daily.      . predniSONE (DELTASONE) 10 MG tablet 6/5/4/3/2/1 21 tablet 0  . pseudoephedrine (SUDAFED) 30 MG tablet Take 30 mg by mouth every 8 (eight) hours as needed for congestion. Reported on 07/16/2016    . [DISCONTINUED] famotidine (PEPCID) 20 MG tablet Take 20 mg by mouth 2 (two) times daily.       No current facility-administered medications on file prior to visit.    ROS as in subjective   Objective: BP 110/68   Pulse 83   Wt 111 lb 3.2 oz (50.4 kg)   BMI 19.09 kg/m   General appearance: alert, no distress, WD/WN, white female HEENT: normocephalic, sclerae anicteric, TMs pearly, nares patent, no discharge or erythema, pharynx normal Oral cavity: MMM, no lesions Neck: supple, no lymphadenopathy, no thyromegaly,  no masses, no bruits Heart: RRR, normal S1, S2, no murmurs Lungs: no wheezing today but still decreased breath sounds, no rhonchi, or rales Pulses: 1+ symmetric, upper and lower extremities, normal cap refill Ext: no edema   Assessment: Encounter Diagnoses  Name Primary?  . Intrinsic asthma Yes  . Raynaud's disease without gangrene   . Scleroderma (HCC)   . CRI (chronic renal insufficiency), stage 3 (moderate) (HCC)   . Anemia of renal disease   . Metabolic bone disease   . Gastroesophageal reflux disease without esophagitis   . History of colitis   . Chronic sinusitis, unspecified location   . Hiatal hernia   . History of ischemic colitis   . Screening for breast cancer   . Abnormal  CT scan, chest   . Vaccine counseling   . Atherosclerosis      Plan: Today was more of a review of her chart record and recommendations.  Unfortunately she has not come in for a preventive care visit or physical in the years of seen her.  I even recommended we change her visit to a physical today but she declined.   Her main reason for being here today is follow-up on asthma.  She is improved from last visit but she still has not checked with her insurance about the formulary.  Insurance does not cover Symbicort but she needs to find out what the formulary covers for preventative inhalers.  I advised full PFTs and an updated chest x-ray.  She will consider but did not let me schedule this today  She is way past due for mammogram and Pap smear, we discussed this  I reviewed the October 07, 2017 [facility] notes and labs done that day.  They felt she was stable from the standpoint of anemia, kidney disease, metabolic bone disease, hypertension.  I reviewed the April 2019 Rheumatology notes as well.  She takes amlodipine for Raynaud's disease, was continued on Prilosec for GERD, history of esophageal stricture.  She was referred for echocardiogram and labs were done there as well and reviewed.  Thus we spent the visit reviewing her chart record, recommendations, preventive care recommendations as she is way past due on certain things.  I went over the following recommendations in great detail and will await her decision to follow-up on some of these things  Patient Instructions     Your diagnosis today includes: Encounter Diagnoses  Name Primary?  . Intrinsic asthma Yes  . Raynaud's disease without gangrene   . Scleroderma (HCC)   . CRI (chronic renal insufficiency), stage 3 (moderate) (HCC)   . Anemia of renal disease   . Metabolic bone disease   . Gastroesophageal reflux disease without esophagitis   . History of colitis   . Chronic sinusitis, unspecified location   .  Hiatal hernia   . History of ischemic colitis   . Screening for breast cancer   . Abnormal CT scan, chest   . Vaccine counseling   . Atherosclerosis    I am providing these recommendations below as part of preventative care recommendations since we haven't seen you for a full physical in a while.  Recommendations:  Diet and exercise  Exercise most days per week, at least 30 minutes or more Foods to avoid or limit - fried foods, high sugar foods, white bread, enriched flour, fast food, red meat, large amounts of cheese, processed foods such as little debbie cakes, cookies, pies, donuts, for example   Foods to include  in the diet - whole grains such as whole grain pasta, whole grain bread, barley, steel cut oatmeal (not instant oatmeal), avocado, fish, green leafy vegetables, nuts, increased fiber in diet, and using olive oil in small amounts for cooking or as salad dressing vinaigrette.    Preventative care  See your eye doctor yearly for routine vision care.  See your dentist yearly for routine dental care including hygiene visits twice yearly.  You are way past due for mammogram and pap smear.    You can either be referred back to gynecology for this, or you can see one of [place] here at [facility] for this, but either way you are due now for this!  Call and schedule a mammogram.  I gave you the contact info on this today  I recommend you have a bone density scan for bone health evaluation  I recommend you take Vitamin D 1000 IU daily OTC  I recommend you get 1200mg  calcium either from oral supplement or 4 servings of dairy daily  Vaccines:  I recommend you have a Shingles Vaccine to help prevent shingles or herpes zoster outbreak.   Please call your insurer to inquire about coverage for the Shingrix vaccine given in 2 doses.   Some insurers cover this vaccine after age 7, some cover this after age 78.  If your insurer covers this, then call to schedule appointment  to have this vaccine here.  I recommend a yearly Influenza/Flu vaccine, typically in September to help reduce the risk of you and others getting the flu illness  I recommend the Prevnar 13/pneumococcal vaccine as well.  You have had the Pneumococcal 23 vaccine, but given your history of asthma, I recommend you also have the Prevenar 13 vaccine.  Please check insurance coverage for this.  Specific medical conditions:  Asthma and abnormal lung findings  Your CT scan from last year showed emphysematous changes in your lungs  I recommend you have a full pulmonary function test (PFT).   This can either be done at [facility] where we schedule this, or we can refer you to pulmonology for consult for this by the pulmonologist  I also recommend an updated chest xray.  This has been ordered.  You can go to [facility] Monday - Friday, 8am - 4: 30pm for this test.   Chronic Kidney Disease and Anemia of kidney disease  Continue routine follow up with [facility] for chronic kidney disease as usual  Scleroderma, Raynauds  Continue routine follow up with your rheumatologist  Atherosclerosis/cholesterol buildup in your arteries  This was noted on CT scan of pelvis this past year  I recommend you come in for fasting cholesterol lab at your convenience  I also recommend you begin Pravachol at bedtime daily to lower your cholesterol and reduce risk of heart disease  We recommend you see [place] yearly for a physical     Masa was seen today for med check.  Diagnoses and all orders for this visit:  Intrinsic asthma -     Cancel: DG Chest 2 View; Future -     Pulmonary Function Test; Future -     DG Chest 2 View; Future  Raynaud's disease without gangrene  Scleroderma (HCC)  CRI (chronic renal insufficiency), stage 3 (moderate) (HCC)  Anemia of renal disease  Metabolic bone disease  Gastroesophageal reflux disease without esophagitis  History of  colitis  Chronic sinusitis, unspecified location  Hiatal hernia  History of ischemic colitis  Screening for breast cancer -     Cancel: MM DIGITAL SCREENING BILATERAL; Future -     MM DIGITAL SCREENING BILATERAL; Future  Abnormal CT scan, chest -     Pulmonary Function Test; Future -     DG Chest 2 View; Future  Vaccine counseling  Atherosclerosis -     Lipid panel; Future   F/u with tests as above.

## 2018-01-14 ENCOUNTER — Other Ambulatory Visit: Payer: Self-pay | Admitting: Medical

## 2018-01-14 ENCOUNTER — Other Ambulatory Visit (INDEPENDENT_AMBULATORY_CARE_PROVIDER_SITE_OTHER): Payer: Medicare PPO

## 2018-01-14 DIAGNOSIS — I7090 Unspecified atherosclerosis: Secondary | ICD-10-CM

## 2018-01-14 DIAGNOSIS — I129 Hypertensive chronic kidney disease with stage 1 through stage 4 chronic kidney disease, or unspecified chronic kidney disease: Secondary | ICD-10-CM

## 2018-01-14 LAB — LIPID PANEL
CHOLESTEROL TOTAL: 157 mg/dL (ref 100–199)
Chol/HDL Ratio: 3.3 ratio (ref 0.0–4.4)
HDL: 47 mg/dL (ref >39)
LDL Calculated: 91 mg/dL (ref 0–99)
Triglycerides: 96 mg/dL (ref 0–149)
VLDL Cholesterol Cal: 19 mg/dL (ref 5–40)

## 2018-01-15 LAB — HEPATIC FUNCTION PANEL
ALT: 7 IU/L (ref 0–32)
AST: 19 IU/L (ref 0–40)
Albumin: 4.4 g/dL (ref 3.6–4.8)
Alkaline Phosphatase: 59 IU/L (ref 39–117)
BILIRUBIN TOTAL: 0.3 mg/dL (ref 0.0–1.2)
BILIRUBIN, DIRECT: 0.08 mg/dL (ref 0.00–0.40)
Total Protein: 7.3 g/dL (ref 6.0–8.5)

## 2018-01-17 ENCOUNTER — Other Ambulatory Visit: Payer: Self-pay | Admitting: Medical

## 2018-01-17 MED ORDER — PRAVASTATIN SODIUM 20 MG PO TABS: 20.0000 mg | ORAL_TABLET | Freq: Every evening | ORAL | 0 refills | Status: DC

## 2018-02-17 ENCOUNTER — Ambulatory Visit: Payer: Medicare PPO

## 2018-02-17 ENCOUNTER — Ambulatory Visit
Admission: RE | Admit: 2018-02-17 | Discharge: 2018-02-17 | Disposition: A | Discharge status: Home / Self Care | Payer: Medicare PPO | Source: Ambulatory Visit | Attending: Medical | Admitting: Medical

## 2018-02-17 DIAGNOSIS — I7300 Raynaud's syndrome without gangrene: Secondary | ICD-10-CM | POA: Diagnosis not present

## 2018-02-17 DIAGNOSIS — Z1239 Encounter for other screening for malignant neoplasm of breast: Secondary | ICD-10-CM

## 2018-02-17 DIAGNOSIS — N189 Chronic kidney disease, unspecified: Secondary | ICD-10-CM | POA: Diagnosis not present

## 2018-02-17 DIAGNOSIS — M79643 Pain in unspecified hand: Secondary | ICD-10-CM | POA: Diagnosis not present

## 2018-02-17 DIAGNOSIS — Z1231 Encounter for screening mammogram for malignant neoplasm of breast: Secondary | ICD-10-CM | POA: Diagnosis not present

## 2018-02-17 DIAGNOSIS — M349 Systemic sclerosis, unspecified: Secondary | ICD-10-CM | POA: Diagnosis not present

## 2018-02-18 ENCOUNTER — Other Ambulatory Visit: Payer: Self-pay | Admitting: Medical

## 2018-02-18 DIAGNOSIS — R928 Other abnormal and inconclusive findings on diagnostic imaging of breast: Secondary | ICD-10-CM

## 2018-02-24 ENCOUNTER — Other Ambulatory Visit: Payer: Medicare PPO

## 2018-03-03 ENCOUNTER — Ambulatory Visit
Admission: RE | Admit: 2018-03-03 | Discharge: 2018-03-03 | Disposition: A | Discharge status: Home / Self Care | Payer: Medicare PPO | Source: Ambulatory Visit | Attending: Medical | Admitting: Medical

## 2018-03-03 ENCOUNTER — Other Ambulatory Visit: Payer: Self-pay | Admitting: Medical

## 2018-03-03 ENCOUNTER — Ambulatory Visit: Payer: Medicare PPO

## 2018-03-03 DIAGNOSIS — R928 Other abnormal and inconclusive findings on diagnostic imaging of breast: Secondary | ICD-10-CM

## 2018-03-03 DIAGNOSIS — N63 Unspecified lump in unspecified breast: Secondary | ICD-10-CM

## 2018-03-03 DIAGNOSIS — R922 Inconclusive mammogram: Secondary | ICD-10-CM | POA: Diagnosis not present

## 2018-03-03 DIAGNOSIS — N6310 Unspecified lump in the right breast, unspecified quadrant: Secondary | ICD-10-CM | POA: Diagnosis not present

## 2018-03-04 ENCOUNTER — Other Ambulatory Visit: Payer: Self-pay | Admitting: Medical

## 2018-03-22 ENCOUNTER — Ambulatory Visit: Payer: Medicare PPO | Admitting: Family Medicine

## 2018-03-30 ENCOUNTER — Telehealth: Payer: Self-pay | Admitting: Medical

## 2018-03-30 MED ORDER — BUDESONIDE-FORMOTEROL FUMARATE 160-4.5 MCG/ACT IN AERO: 2.0000 | INHALATION_SPRAY | Freq: Two times a day (BID) | RESPIRATORY_TRACT | 0 refills | Status: DC

## 2018-03-30 NOTE — Telephone Encounter (Signed)
Pt called for sample Symbicort, samples given

## 2018-05-02 ENCOUNTER — Telehealth: Payer: Self-pay | Admitting: Medical

## 2018-05-02 NOTE — Telephone Encounter (Signed)
Pt called for samples of Symbicort, we are out of 160-4.5.  Called rep for samples but will take a week to get in mail.  She said [employer] should cover Symbicort.  Called pharmacy and had them process Symbicort and it did go thru but is $312 for 1 inhaler with insurance.  And she can't afford that.  Per Vincenza Hews ok to give samples 80-4.5.

## 2018-05-16 NOTE — Telephone Encounter (Signed)
Sample of Symbicort was recv'd of the 160/4.5 and was given to pt

## 2018-06-22 ENCOUNTER — Telehealth: Payer: Self-pay | Admitting: Medical

## 2018-06-22 MED ORDER — BUDESONIDE-FORMOTEROL FUMARATE 160-4.5 MCG/ACT IN AERO: 2.0000 | INHALATION_SPRAY | Freq: Two times a day (BID) | RESPIRATORY_TRACT | 0 refills | Status: DC

## 2018-06-22 NOTE — Telephone Encounter (Signed)
Pt called Shauna for sample, 160/4.5 sample given.

## 2018-07-21 DIAGNOSIS — E889 Metabolic disorder, unspecified: Secondary | ICD-10-CM | POA: Diagnosis not present

## 2018-07-21 DIAGNOSIS — N183 Chronic kidney disease, stage 3 (moderate): Secondary | ICD-10-CM | POA: Diagnosis not present

## 2018-07-28 DIAGNOSIS — M9080 Osteopathy in diseases classified elsewhere, unspecified site: Secondary | ICD-10-CM | POA: Diagnosis not present

## 2018-07-28 DIAGNOSIS — N183 Chronic kidney disease, stage 3 (moderate): Secondary | ICD-10-CM | POA: Diagnosis not present

## 2018-07-28 DIAGNOSIS — E889 Metabolic disorder, unspecified: Secondary | ICD-10-CM | POA: Diagnosis not present

## 2018-07-28 DIAGNOSIS — J329 Chronic sinusitis, unspecified: Secondary | ICD-10-CM | POA: Diagnosis not present

## 2018-07-28 DIAGNOSIS — I129 Hypertensive chronic kidney disease with stage 1 through stage 4 chronic kidney disease, or unspecified chronic kidney disease: Secondary | ICD-10-CM | POA: Diagnosis not present

## 2018-07-28 DIAGNOSIS — D631 Anemia in chronic kidney disease: Secondary | ICD-10-CM | POA: Diagnosis not present

## 2018-08-03 ENCOUNTER — Encounter: Payer: Self-pay | Admitting: Medical

## 2018-08-04 ENCOUNTER — Telehealth: Payer: Self-pay | Admitting: Medical

## 2018-08-04 NOTE — Telephone Encounter (Signed)
Spoke with patient and told her that we do not have any samples of symbicort at this time.   Patient wanted me to let Vernona RiegerLaura know to call her when samples come in.

## 2018-08-04 NOTE — Telephone Encounter (Signed)
  Patient states that she normally gets symbicort samples from us and is needing to get another one  Please call patient

## 2018-08-10 ENCOUNTER — Telehealth: Payer: Self-pay | Admitting: Medical

## 2018-08-10 MED ORDER — FLUTICASONE FUROATE-VILANTEROL 200-25 MCG/INH IN AEPB: 1.0000 | INHALATION_SPRAY | Freq: Every day | RESPIRATORY_TRACT | 0 refills | Status: DC

## 2018-08-10 NOTE — Telephone Encounter (Signed)
History is as follows Dulera required P.A. Was denied, need trial Breo & Advair Advair didn't work for pt Symbicort works well but pt can't afford $312 each inhaler.   Per Vincenza HewsShane will try Breo.  Pt informed, she is to call in 1 week and let us know how it worked for her.  #1 sample given

## 2018-08-10 NOTE — Telephone Encounter (Signed)
See telephone call 08/10/18

## 2018-08-10 NOTE — Telephone Encounter (Signed)
Pt called needing samples of Symbicort and advised we still not have any and she is almost out.  Cost is $312 for 1 inhaler and pt can't afford and we have exhausted other ways to help pt.  Pt has used Advair in the past and it didn't work well for her.  Symbicort is hard to get samples of now.  Can we switch pt to something else that we have more samples available of? Pt is willing to try whatever

## 2018-08-10 NOTE — Telephone Encounter (Signed)
She can call her insurance to inquire abut "preferred coverage"  Similar medications include Dulera and Breo inhalers.    If we have a sample of Dulera, she can try this while trying to inquire to insurance

## 2018-08-18 DIAGNOSIS — I7300 Raynaud's syndrome without gangrene: Secondary | ICD-10-CM | POA: Diagnosis not present

## 2018-08-18 DIAGNOSIS — N189 Chronic kidney disease, unspecified: Secondary | ICD-10-CM | POA: Diagnosis not present

## 2018-08-18 DIAGNOSIS — M79643 Pain in unspecified hand: Secondary | ICD-10-CM | POA: Diagnosis not present

## 2018-08-18 DIAGNOSIS — M349 Systemic sclerosis, unspecified: Secondary | ICD-10-CM | POA: Diagnosis not present

## 2018-08-22 ENCOUNTER — Other Ambulatory Visit: Payer: Self-pay | Admitting: Medical

## 2018-08-22 MED ORDER — FLUTICASONE FUROATE-VILANTEROL 200-25 MCG/INH IN AEPB: 1.0000 | INHALATION_SPRAY | Freq: Every day | RESPIRATORY_TRACT | 2 refills | Status: DC

## 2018-08-22 NOTE — Telephone Encounter (Signed)
Breo sent.

## 2018-08-22 NOTE — Telephone Encounter (Signed)
Pt called & states Breo worked well and needs another sample and would like you to call this in to see how much it will cost thru insurance.  I checked online and there is no discount card available with Medicare .  Sample given to hold pt.  Please send in Rx to pharmacy

## 2018-08-23 NOTE — Telephone Encounter (Signed)
Called pharmacy and Breo cost with insurance is [phone]$302 which pt will not be able to afford, so we will have to supply with samples when available.

## 2018-09-05 ENCOUNTER — Other Ambulatory Visit: Payer: Medicare PPO

## 2018-10-29 LAB — GLUCOSE, POCT (MANUAL RESULT ENTRY): POC Glucose: 75 mg/dl (ref 70–99)

## 2018-11-14 ENCOUNTER — Telehealth: Payer: Self-pay | Admitting: Medical

## 2018-11-14 NOTE — Telephone Encounter (Signed)
See if we have some

## 2018-11-14 NOTE — Telephone Encounter (Signed)
  Pt called wants Breo samples  [phone]  Please call

## 2018-11-15 NOTE — Telephone Encounter (Signed)
Patient notified to come by and pick up sample of Breo.

## 2018-11-29 ENCOUNTER — Telehealth: Payer: Self-pay

## 2018-11-29 MED ORDER — FLUTICASONE FUROATE-VILANTEROL 200-25 MCG/INH IN AEPB: 1.0000 | INHALATION_SPRAY | Freq: Every day | RESPIRATORY_TRACT | 1 refills | Status: DC

## 2018-11-29 NOTE — Telephone Encounter (Signed)
Patient called to get samples on Breo. 2 boxes of Virgel BouquetBreo is being supplied to patient.

## 2018-12-06 ENCOUNTER — Encounter: Payer: Self-pay | Admitting: Medical

## 2018-12-06 ENCOUNTER — Ambulatory Visit: Payer: Medicare PPO | Admitting: Medical

## 2018-12-06 VITALS — BP 120/76 | HR 76 | Temp 98.1°F | Resp 16 | Ht 64.0 in | Wt 108.6 lb

## 2018-12-06 DIAGNOSIS — D631 Anemia in chronic kidney disease: Secondary | ICD-10-CM | POA: Diagnosis not present

## 2018-12-06 DIAGNOSIS — Z7952 Long term (current) use of systemic steroids: Secondary | ICD-10-CM

## 2018-12-06 DIAGNOSIS — J45909 Unspecified asthma, uncomplicated: Secondary | ICD-10-CM | POA: Diagnosis not present

## 2018-12-06 DIAGNOSIS — N189 Chronic kidney disease, unspecified: Secondary | ICD-10-CM

## 2018-12-06 DIAGNOSIS — E2839 Other primary ovarian failure: Secondary | ICD-10-CM

## 2018-12-06 DIAGNOSIS — M349 Systemic sclerosis, unspecified: Secondary | ICD-10-CM | POA: Diagnosis not present

## 2018-12-06 DIAGNOSIS — N183 Chronic kidney disease, stage 3 unspecified: Secondary | ICD-10-CM

## 2018-12-06 DIAGNOSIS — J329 Chronic sinusitis, unspecified: Secondary | ICD-10-CM | POA: Diagnosis not present

## 2018-12-06 MED ORDER — PREDNISONE 10 MG PO TABS: ORAL_TABLET | ORAL | 0 refills | Status: DC

## 2018-12-06 MED ORDER — AZITHROMYCIN 500 MG PO TABS: 500.0000 mg | ORAL_TABLET | Freq: Every day | ORAL | 0 refills | Status: AC

## 2018-12-06 MED ORDER — ALBUTEROL SULFATE HFA 108 (90 BASE) MCG/ACT IN AERS: 2.0000 | INHALATION_SPRAY | Freq: Four times a day (QID) | RESPIRATORY_TRACT | 1 refills | Status: DC | PRN

## 2018-12-06 MED ORDER — ALBUTEROL SULFATE (2.5 MG/3ML) 0.083% IN NEBU: 2.5000 mg | INHALATION_SOLUTION | Freq: Four times a day (QID) | RESPIRATORY_TRACT | 1 refills | Status: DC | PRN

## 2018-12-06 NOTE — Progress Notes (Signed)
Subjective: Chief Complaint  Patient presents with  . cough    cough, itchy throat, fever  X Monday am   Yesterday worse with cough, congestion, but had some congestion, sore throat and drainage last week.    Has had sick contact (sister) with recent URI symptoms.   Has had subjective fever.   Fever up to 101 yesterday.   Has had some body aches, chills.  Some nausea.  No vomiting, no diarrhea.  Head and sinuses hurt.  Coughing up some productive mucous green Manson Passey/brown.  No other aggravating or relieving factors. No other complaint.  Past Medical History:  Diagnosis Date  . Allergic rhinitis   . Asthma   . Atherosclerosis   . Chronic kidney disease (CKD), stage III (moderate) (HCC)   . Esophageal stricture   . GERD (gastroesophageal reflux disease)   . History of anemia due to chronic kidney disease   . History of colitis 2017  . Hypertension   . Metabolic bone disease   . Raynaud disease   . Scleroderma (HCC)    Current Outpatient Medications on File Prior to Visit  Medication Sig Dispense Refill  . amLODipine (NORVASC) 5 MG tablet Take 5 mg by mouth 2 (two) times daily.     . fluticasone (FLONASE) 50 MCG/ACT nasal spray Place 1 spray into both nostrils daily as needed (for nasal congestion). Reported on 07/16/2016    . fluticasone furoate-vilanterol (BREO ELLIPTA) 200-25 MCG/INH AEPB Inhale 1 puff into the lungs daily. 28 each 1  . lisinopril (PRINIVIL,ZESTRIL) 5 MG tablet Take 1 tablet (5 mg total) by mouth daily.    [name]. omeprazole (PRILOSEC) 20 MG capsule Take 20 mg by mouth 2 (two) times daily.      . pseudoephedrine (SUDAFED) 30 MG tablet Take 30 mg by mouth every 8 (eight) hours as needed for congestion. Reported on 07/16/2016    . acetaminophen (TYLENOL) 500 MG tablet Take 1,000 mg by mouth every 6 (six) hours as needed for mild pain.    . [DISCONTINUED] famotidine (PEPCID) 20 MG tablet Take 20 mg by mouth 2 (two) times daily.       No current facility-administered medications on  file prior to visit.    ROS as in subjective   Objective: BP 120/76   Pulse 76   Temp 98.1 F (36.7 C) (Oral)   Resp 16   Ht 5\' 4"  (1.626 m)   Wt 108 lb 9.6 oz (49.3 kg)   BMI 18.64 kg/m   General appearance: Alert, WD/WN, no distress,mildly ill appearing                             Skin: warm, no rash, no diaphoresis                           Head: mild sinus tenderness                            Eyes: conjunctiva normal, corneas clear, PERRLA                            Ears: pearly TMs, external ear canals normal                          Nose: septum midline, turbinates swollen, with  erythema and mucoid discharge             Mouth/throat: MMM, tongue normal, mild pharyngeal erythema                           Neck: supple, no adenopathy, no thyromegaly, non tender                          Heart: RRR, normal S1, S2, no murmurs                         Lungs: +bronchial breath sounds, +scattered rhonchi, + mild wheezes, no rales                Extremities: no edema, non tender        Assessment: Encounter Diagnoses  Name Primary?  . Chronic sinusitis, unspecified location Yes  . Intrinsic asthma   . Anemia of renal disease   . CRI (chronic renal insufficiency), stage 3 (moderate) (HCC)   . Scleroderma (HCC)   . Estrogen deficiency   . Long term systemic steroid user      Plan: Begin prednisone and albuterol to help with the asthma flare related to respiratory tract infection.  azithromycin as below, rest, hydration, and call if not improving the next 3 to 4 days  Given her underlying health history I strongly advise she do the bone density screen which she has not yet done.  We have discussed this on several prior occasions but she is yet to do this screening test.  I discussed the fact that with repeated use of prednisone over time and her daily inhaled steroid that she should have a bone density scan for she is higher risk for osteoporosis.  I gave her information to set  up the appointment and I ordered the test  Cyra was seen today for cough.  Diagnoses and all orders for this visit:  Chronic sinusitis, unspecified location  Intrinsic asthma  Anemia of renal disease  CRI (chronic renal insufficiency), stage 3 (moderate) (HCC) -     DG Bone Density; Future  Scleroderma (HCC) -     DG Bone Density; Future  Estrogen deficiency -     DG Bone Density; Future  Long term systemic steroid user -     DG Bone Density; Future  Other orders -     albuterol (PROAIR HFA) 108 (90 Base) MCG/ACT inhaler; Inhale 2 puffs into the lungs every 6 (six) hours as needed for wheezing. -     predniSONE (DELTASONE) 10 MG tablet; 6/5/4/3/2/1 -     azithromycin (ZITHROMAX) 500 MG tablet; Take 1 tablet (500 mg total) by mouth daily for 5 days. -     albuterol (PROVENTIL) (2.5 MG/3ML) 0.083% nebulizer solution; Take 3 mLs (2.5 mg total) by nebulization every 6 (six) hours as needed for wheezing or shortness of breath.

## 2018-12-06 NOTE — Patient Instructions (Signed)
[facility] for your bone density xray.   Their hours are 8am - 4:30 pm Monday - Friday.  Take your insurance card with you.  [facility] [phone]  [address] [place], [place] [address]  [address] [place], [place] [address]

## 2018-12-26 ENCOUNTER — Telehealth: Payer: Self-pay | Admitting: Medical

## 2018-12-26 MED ORDER — FLUTICASONE FUROATE-VILANTEROL 200-25 MCG/INH IN AEPB: 1.0000 | INHALATION_SPRAY | Freq: Every day | RESPIRATORY_TRACT | 0 refills | Status: DC

## 2018-12-26 NOTE — Telephone Encounter (Signed)
Pt called needing samples Breo 200/25 #2 given

## 2019-01-24 ENCOUNTER — Telehealth: Payer: Self-pay | Admitting: Medical

## 2019-01-24 NOTE — Telephone Encounter (Signed)
Pt called for sample Breo, ok per Vincenza Hews

## 2019-02-22 ENCOUNTER — Telehealth: Payer: Self-pay | Admitting: Medical

## 2019-02-22 MED ORDER — FLUTICASONE FUROATE-VILANTEROL 200-25 MCG/INH IN AEPB: 1.0000 | INHALATION_SPRAY | Freq: Every day | RESPIRATORY_TRACT | 0 refills | Status: DC

## 2019-02-22 NOTE — Telephone Encounter (Signed)
Pt called for samples of Breo, ok per Vincenza Hews #2 given

## 2019-03-08 DIAGNOSIS — M349 Systemic sclerosis, unspecified: Secondary | ICD-10-CM | POA: Diagnosis not present

## 2019-03-08 DIAGNOSIS — I7300 Raynaud's syndrome without gangrene: Secondary | ICD-10-CM | POA: Diagnosis not present

## 2019-03-08 DIAGNOSIS — N189 Chronic kidney disease, unspecified: Secondary | ICD-10-CM | POA: Diagnosis not present

## 2019-03-08 DIAGNOSIS — M79643 Pain in unspecified hand: Secondary | ICD-10-CM | POA: Diagnosis not present

## 2019-03-08 DIAGNOSIS — L940 Localized scleroderma [morphea]: Secondary | ICD-10-CM | POA: Diagnosis not present

## 2019-03-13 ENCOUNTER — Encounter: Payer: Self-pay | Admitting: Medical

## 2019-03-13 ENCOUNTER — Ambulatory Visit: Payer: Medicare PPO | Admitting: Medical

## 2019-03-13 ENCOUNTER — Other Ambulatory Visit: Payer: Self-pay

## 2019-03-13 VITALS — BP 110/60 | HR 78 | Temp 98.0°F | Resp 16 | Ht 64.0 in | Wt 104.2 lb

## 2019-03-13 DIAGNOSIS — N183 Chronic kidney disease, stage 3 (moderate): Secondary | ICD-10-CM | POA: Diagnosis not present

## 2019-03-13 DIAGNOSIS — J45909 Unspecified asthma, uncomplicated: Secondary | ICD-10-CM

## 2019-03-13 DIAGNOSIS — J329 Chronic sinusitis, unspecified: Secondary | ICD-10-CM

## 2019-03-13 MED ORDER — ALBUTEROL SULFATE (2.5 MG/3ML) 0.083% IN NEBU: 2.5000 mg | INHALATION_SOLUTION | Freq: Four times a day (QID) | RESPIRATORY_TRACT | 1 refills | Status: DC | PRN

## 2019-03-13 MED ORDER — AZITHROMYCIN 250 MG PO TABS: ORAL_TABLET | ORAL | 0 refills | Status: DC

## 2019-03-13 MED ORDER — BUDESONIDE 0.5 MG/2ML IN SUSP: 0.5000 mg | Freq: Two times a day (BID) | RESPIRATORY_TRACT | 3 refills | Status: DC

## 2019-03-13 NOTE — Progress Notes (Signed)
Subjective:  [name] is a 63 y.o. female who presents for  Chief Complaint  Patient presents with  . allergies    cough, sinus pain, weakness, joint pain X 1 week   Here for 1 week hx/o symptoms.   Started a week ago with sneezing and dry cough.   Daughter was cleaning the house and this flared up her asthma.  So currently has cough, sinus pressure,  Ears feel tight.   No fever, no body aches.   Has been cold and some joint aches ,but its also been cold weather.   No NVD.   No sick contacts.  Nonsmoker.  No other aggravating or relieving factors.  No other complaint.    Past Medical History:  Diagnosis Date  . Allergic rhinitis   . Asthma   . Atherosclerosis   . Chronic kidney disease (CKD), stage III (moderate) (HCC)   . Esophageal stricture   . GERD (gastroesophageal reflux disease)   . History of anemia due to chronic kidney disease   . History of colitis 2017  . Hypertension   . Metabolic bone disease   . Raynaud disease   . Scleroderma (HCC)     Current Outpatient Medications on File Prior to Visit  Medication Sig Dispense Refill  . acetaminophen (TYLENOL) 500 MG tablet Take 1,000 mg by mouth every 6 (six) hours as needed for mild pain.    [name] albuterol (PROAIR HFA) 108 (90 Base) MCG/ACT inhaler Inhale 2 puffs into the lungs every 6 (six) hours as needed for wheezing. 1 Inhaler 1  . amLODipine (NORVASC) 5 MG tablet Take 5 mg by mouth 2 (two) times daily.     . fluticasone (FLONASE) 50 MCG/ACT nasal spray Place 1 spray into both nostrils daily as needed (for nasal congestion). Reported on 07/16/2016    . fluticasone furoate-vilanterol (BREO ELLIPTA) 200-25 MCG/INH AEPB Inhale 1 puff into the lungs daily. 2 each 0  . lisinopril (PRINIVIL,ZESTRIL) 5 MG tablet Take 1 tablet (5 mg total) by mouth daily.    [name] omeprazole (PRILOSEC) 20 MG capsule Take 20 mg by mouth 2 (two) times daily.      . pseudoephedrine (SUDAFED) 30 MG tablet Take 30 mg by mouth every 8 (eight) hours as  needed for congestion. Reported on 07/16/2016    . [DISCONTINUED] famotidine (PEPCID) 20 MG tablet Take 20 mg by mouth 2 (two) times daily.       No current facility-administered medications on file prior to visit.     ROS as in subjective   Objective: BP 110/60   Pulse 78   Temp 98 F (36.7 C) (Oral)   Resp 16   Ht 5\' 4"  (1.626 m)   Wt 104 lb 3.2 oz (47.3 kg)   BMI 17.89 kg/m   General appearance: Alert, well developed, well nourished, no distress                             Skin: warm, no rash                           Head: +mild sinus tenderness,                            Eyes: conjunctiva pink, corneas clear  Ears: flat left tympanic membrane, flat right tympanic membrane, external ear canals normal                          Nose: septum midline, turbinates swollen, with erythema and clear discharge             Mouth/throat: MMM, tongue normal, mild pharyngeal erythema                           Neck: supple, no adenopathy, no thyromegaly, non tender                         Lungs: clear, no wheezes, no rales, no rhonchi        Assessment  Encounter Diagnoses  Name Primary?  . Chronic sinusitis, unspecified location Yes  . Intrinsic asthma       Plan: Discussed diagnosis of sinusitis.   Discussed usual time frame to see improvement. Discussed possible complications or symptoms that would prompt call back or recheck within the next few days.     Medications prescribed:  Complete the course of Zpak antibiotic prescribed today.   Continue rest, hydration, can use Delsym for cough.  Regarding asthma, when she runs out of samples of Breo, change to Pulmicort BID by nebulizer.   Insurance this past year wouldn't cover any preventative asthma inhalers.   We will see if this is covered by insurance.  [name] was seen today for allergies.  Diagnoses and all orders for this visit:  Chronic sinusitis, unspecified location  Intrinsic  asthma  Other orders -     albuterol (PROVENTIL) (2.5 MG/3ML) 0.083% nebulizer solution; Take 3 mLs (2.5 mg total) by nebulization every 6 (six) hours as needed for wheezing or shortness of breath. -     budesonide (PULMICORT) 0.5 MG/2ML nebulizer solution; Take 2 mLs (0.5 mg total) by nebulization 2 (two) times daily. -     azithromycin (ZITHROMAX) 250 MG tablet; 2 tablets day 1, then 1 tablet days 2-4    Patient was advised to call or return if worse or not improving in the next few days.    Patient voiced understanding of diagnosis, recommendations, and treatment plan.

## 2019-03-13 NOTE — Patient Instructions (Signed)
Call insurance to see what the insurance preferred dug is?  Or see what coverage is for the following:   Inhaled steroids include: Flovent Qvar Pulmicort   Inhaled combo steroids and long acting bronchodilators include: Advair Symbicort Lavada Mesi

## 2019-03-14 ENCOUNTER — Other Ambulatory Visit: Payer: Self-pay | Admitting: Rheumatology

## 2019-03-14 ENCOUNTER — Other Ambulatory Visit (HOSPITAL_COMMUNITY): Payer: Self-pay | Admitting: Rheumatology

## 2019-03-14 DIAGNOSIS — M349 Systemic sclerosis, unspecified: Secondary | ICD-10-CM

## 2019-03-20 ENCOUNTER — Other Ambulatory Visit: Payer: Self-pay | Admitting: Rheumatology

## 2019-03-20 DIAGNOSIS — M349 Systemic sclerosis, unspecified: Secondary | ICD-10-CM

## 2019-03-20 DIAGNOSIS — N183 Chronic kidney disease, stage 3 (moderate): Secondary | ICD-10-CM | POA: Diagnosis not present

## 2019-03-20 DIAGNOSIS — M9080 Osteopathy in diseases classified elsewhere, unspecified site: Secondary | ICD-10-CM | POA: Diagnosis not present

## 2019-03-20 DIAGNOSIS — D631 Anemia in chronic kidney disease: Secondary | ICD-10-CM | POA: Diagnosis not present

## 2019-03-20 DIAGNOSIS — I129 Hypertensive chronic kidney disease with stage 1 through stage 4 chronic kidney disease, or unspecified chronic kidney disease: Secondary | ICD-10-CM | POA: Diagnosis not present

## 2019-03-20 DIAGNOSIS — E889 Metabolic disorder, unspecified: Secondary | ICD-10-CM | POA: Diagnosis not present

## 2019-04-03 ENCOUNTER — Other Ambulatory Visit (HOSPITAL_COMMUNITY): Payer: Medicare PPO

## 2019-04-27 ENCOUNTER — Telehealth: Payer: Self-pay | Admitting: Medical

## 2019-04-27 MED ORDER — FLUTICASONE FUROATE-VILANTEROL 200-25 MCG/INH IN AEPB: 1.0000 | INHALATION_SPRAY | Freq: Every day | RESPIRATORY_TRACT | 0 refills | Status: DC

## 2019-04-27 NOTE — Telephone Encounter (Signed)
Called pt and she states the nebulizer isn't working as well as the [employer], states she coughs all the time.  I received Breo samples and ok to give per [facility].  # 4 given Breo 200/25

## 2019-06-15 ENCOUNTER — Other Ambulatory Visit: Payer: Self-pay

## 2019-06-15 ENCOUNTER — Ambulatory Visit
Admission: RE | Admit: 2019-06-15 | Discharge: 2019-06-15 | Disposition: A | Discharge status: Home / Self Care | Payer: Medicare PPO | Source: Ambulatory Visit | Attending: Rheumatology | Admitting: Rheumatology

## 2019-06-15 ENCOUNTER — Telehealth (HOSPITAL_COMMUNITY): Payer: Self-pay

## 2019-06-15 DIAGNOSIS — M349 Systemic sclerosis, unspecified: Secondary | ICD-10-CM

## 2019-06-15 DIAGNOSIS — J8410 Pulmonary fibrosis, unspecified: Secondary | ICD-10-CM | POA: Diagnosis not present

## 2019-06-15 NOTE — Telephone Encounter (Signed)

## 2019-06-16 ENCOUNTER — Ambulatory Visit (HOSPITAL_COMMUNITY): Payer: Medicare PPO | Attending: Cardiology

## 2019-06-16 ENCOUNTER — Other Ambulatory Visit: Payer: Self-pay

## 2019-06-16 DIAGNOSIS — M349 Systemic sclerosis, unspecified: Secondary | ICD-10-CM | POA: Diagnosis not present

## 2019-08-07 ENCOUNTER — Telehealth: Payer: Self-pay | Admitting: Medical

## 2019-08-07 NOTE — Telephone Encounter (Signed)
Pt called for samples Breo, Samples given Audelia Acton has ok'd

## 2019-08-24 ENCOUNTER — Ambulatory Visit: Payer: Medicare PPO | Admitting: Emergency Medicine

## 2019-08-24 ENCOUNTER — Other Ambulatory Visit: Payer: Self-pay

## 2019-08-24 ENCOUNTER — Encounter: Payer: Self-pay | Admitting: Emergency Medicine

## 2019-08-24 DIAGNOSIS — M349 Systemic sclerosis, unspecified: Secondary | ICD-10-CM

## 2019-08-24 DIAGNOSIS — J45909 Unspecified asthma, uncomplicated: Secondary | ICD-10-CM

## 2019-08-24 DIAGNOSIS — R9389 Abnormal findings on diagnostic imaging of other specified body structures: Secondary | ICD-10-CM

## 2019-08-24 NOTE — Progress Notes (Signed)
Subjective:    Patient ID: [name], female    DOB: June 01, 1956, 63 y.o.   MRN: [id number]  HPI 63 year old never smoker with a history of scleroderma and Raynaud's followed by, hypertension on lisinopril, chronic kidney disease, GERD, and asthma since 1981. She is off of immunosuppression, has been for many years. Previously on MTX.  She is referred today for evaluation of an abnormal CT scan of the chest.  She has a hx PNA's before, last 2009. She underwent screening CT chest 06/15/2019 that I reviewed.  This shows mild hyperinflation, peripheral scattered upper lobe predominant fibrotic change some with nodular component.  TTE from 06/16/2019 reviewed, no evidence for PAH in setting of her scleroderma.   Has been fairly well. Uses Breo, minimal albuterol use.  She averages about 1 exacerbation annually  Cleda DaubSpiro 2010 from here >> FEV1 65% predicted.    Review of Systems  Constitutional: Negative for fever and unexpected weight change.  HENT: Negative for congestion, dental problem, ear pain, nosebleeds, postnasal drip, rhinorrhea, sinus pressure, sneezing, sore throat and trouble swallowing.   Eyes: Negative for redness and itching.  Respiratory: Negative for cough, chest tightness, shortness of breath and wheezing.   Cardiovascular: Negative for palpitations and leg swelling.  Gastrointestinal: Negative for nausea and vomiting.  Genitourinary: Negative for dysuria.  Musculoskeletal: Negative for joint swelling.  Skin: Negative for rash.  Neurological: Negative for headaches.  Hematological: Does not bruise/bleed easily.  Psychiatric/Behavioral: Negative for dysphoric mood. The patient is not nervous/anxious.     Past Medical History:  Diagnosis Date  . Allergic rhinitis   . Asthma   . Atherosclerosis   . Chronic kidney disease (CKD), stage III (moderate) (HCC)   . Esophageal stricture   . GERD (gastroesophageal reflux disease)   . History of anemia due to chronic kidney  disease   . History of colitis 2017  . Hypertension   . Metabolic bone disease   . Raynaud disease   . Scleroderma (HCC)      Family History  Problem Relation Age of Onset  . Heart failure Mother   . Hepatitis Mother        C  . Dementia Mother   . COPD Mother        emphysema  . Cancer Father        lung with mets to brain  . Diabetes Sister   . Hepatitis Brother   . Cancer Sister        leukemia  . Colon cancer Neg Hx      Social History   Socioeconomic History  . Marital status: Single    Spouse name: Not on file  . Number of children: Not on file  . Years of education: Not on file  . Highest education level: Not on file  Occupational History  . Not on file  Social Needs  . Financial resource strain: Not on file  . Food insecurity    Worry: Not on file    Inability: Not on file  . Transportation needs    Medical: Not on file    Non-medical: Not on file  Tobacco Use  . Smoking status: Never Smoker  . Smokeless tobacco: Never Used  Substance and Sexual Activity  . Alcohol use: No  . Drug use: No  . Sexual activity: Not on file  Lifestyle  . Physical activity    Days per week: Not on file    Minutes per session: Not on file  .  Stress: Not on file  Relationships  . Social Herbalist on phone: Not on file    Gets together: Not on file    Attends religious service: Not on file    Active member of club or organization: Not on file    Attends meetings of clubs or organizations: Not on file    Relationship status: Not on file  . Intimate partner violence    Fear of current or ex partner: Not on file    Emotionally abused: Not on file    Physically abused: Not on file    Forced sexual activity: Not on file  Other Topics Concern  . Not on file  Social History Narrative  . Not on file     Allergies  Allergen Reactions  . Avelox [Moxifloxacin Hcl In Nacl]     Caused tachycardia  . Codeine Hives and Other (See Comments)    Hallucination,    . Penicillins Hives    Has patient had a PCN reaction causing immediate rash, facial/tongue/throat swelling, SOB or lightheadedness with hypotension:NO Has patient had a PCN reaction causing severe rash involving mucus membranes or skin necrosis:UNSURE Has patient had a PCN reaction that required hospitalization:No Has patient had a PCN reaction occurring within the last 10 years:No If all of the above answers are "NO", then may proceed with Cephalosporin use.      Outpatient Medications Prior to Visit  Medication Sig Dispense Refill  . acetaminophen (TYLENOL) 500 MG tablet Take 1,000 mg by mouth every 6 (six) hours as needed for mild pain.    [name] albuterol (PROAIR HFA) 108 (90 Base) MCG/ACT inhaler Inhale 2 puffs into the lungs every 6 (six) hours as needed for wheezing. 1 Inhaler 1  . albuterol (PROVENTIL) (2.5 MG/3ML) 0.083% nebulizer solution Take 3 mLs (2.5 mg total) by nebulization every 6 (six) hours as needed for wheezing or shortness of breath. 75 mL 1  . amLODipine (NORVASC) 5 MG tablet Take 5 mg by mouth 2 (two) times daily.     . fluticasone (FLONASE) 50 MCG/ACT nasal spray Place 1 spray into both nostrils daily as needed (for nasal congestion). Reported on 07/16/2016    . fluticasone furoate-vilanterol (BREO ELLIPTA) 200-25 MCG/INH AEPB Inhale 1 puff into the lungs daily. 4 each 0  . lisinopril (PRINIVIL,ZESTRIL) 5 MG tablet Take 1 tablet (5 mg total) by mouth daily.    [name] omeprazole (PRILOSEC) 20 MG capsule Take 20 mg by mouth 2 (two) times daily.      . pseudoephedrine (SUDAFED) 30 MG tablet Take 30 mg by mouth every 8 (eight) hours as needed for congestion. Reported on 07/16/2016    . azithromycin (ZITHROMAX) 250 MG tablet 2 tablets day 1, then 1 tablet days 2-4 6 tablet 0  . budesonide (PULMICORT) 0.5 MG/2ML nebulizer solution Take 2 mLs (0.5 mg total) by nebulization 2 (two) times daily. 120 mL 3   No facility-administered medications prior to visit.         Objective:    Physical Exam Vitals:   08/24/19 1612  BP: 108/84  Pulse: 78  SpO2: 100%  Weight: 108 lb (49 kg)  Height: 5\' 3"  (1.6 m)   Gen: Pleasant, thin, in no distress,  normal affect  ENT: No lesions,  mouth clear,  oropharynx clear, no postnasal drip  Neck: No JVD, no stridor  Lungs: No use of accessory muscles, no crackles.  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  This is not in a UIP pattern.  I suspect that this is the sequela of previous pneumonias or due to prior inflammation from her scleroderma.  No clear evidence for active ILD.  She needs surveillance and we will plan to repeat her CT scan of the chest, high-resolution cuts in 1 year.  Sooner if she has any new respiratory findings.  Scleroderma (HCC) Not currently on immunosuppression.  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  Swallowing precautions, GERD precautions advised. She had a surveillance echocardiogram in June that I reviewed, showed normal RV function without any evidence for pulmonary hypertension.  She will need an annual echocardiogram for screening.  Intrinsic asthma Longstanding diagnosis of asthma with documented obstruction on spirometry from 2010.  Currently managed on Breo.  She averages about 1 exacerbation annually, rarely needs her albuterol otherwise.  We will continue Breo, repeat her pulmonary function testing to assess her degree of obstruction.  Flu shot this fall.  Pneumonia vaccine is up-to-date.  Levy Pupa, MD, PhD 08/24/2019, 5:08 PM   Pulmonary [facility] Care [phone] or if no answer [phone]

## 2019-08-24 NOTE — Assessment & Plan Note (Signed)
Not currently on immunosuppression.  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  Swallowing precautions, GERD precautions advised. She had a surveillance echocardiogram in June that I reviewed, showed normal RV function without any evidence for pulmonary hypertension.  She will need an annual echocardiogram for screening.

## 2019-08-24 NOTE — Assessment & Plan Note (Signed)
Longstanding diagnosis of asthma with documented obstruction on spirometry from 2010.  Currently managed on Breo.  She averages about 1 exacerbation annually, rarely needs her albuterol otherwise.  We will continue Breo, repeat her pulmonary function testing to assess her degree of obstruction.  Flu shot this fall.  Pneumonia vaccine is up-to-date.

## 2019-08-24 NOTE — Assessment & Plan Note (Signed)
Mild scattered interstitial changes without any evidence of groundglass or active inflammatory process.  This is not in a UIP pattern.  I suspect that this is the sequela of previous pneumonias or due to prior inflammation from her scleroderma.  No clear evidence for active ILD.  She needs surveillance and we will plan to repeat her CT scan of the chest, high-resolution cuts in 1 year.  Sooner if she has any new respiratory findings.

## 2019-08-24 NOTE — Patient Instructions (Signed)
We will plan to perform a high-resolution CT scan of the chest in June 2021 to compare with your priors We will repeat your echocardiogram in June 2021 Please continue to [facility] once daily as you have been taking it.  Rinse and gargle after using. Keep your albuterol available to use 2 puffs if needed for shortness of breath, chest tightness, wheezing. We will repeat your pulmonary function testing at some point before next visit. Get the flu shot this Fall  Follow with Dr Lamonte Sakai in 6 months or sooner if you have any problems

## 2019-09-11 DIAGNOSIS — M79643 Pain in unspecified hand: Secondary | ICD-10-CM | POA: Diagnosis not present

## 2019-09-11 DIAGNOSIS — N189 Chronic kidney disease, unspecified: Secondary | ICD-10-CM | POA: Diagnosis not present

## 2019-09-11 DIAGNOSIS — M349 Systemic sclerosis, unspecified: Secondary | ICD-10-CM | POA: Diagnosis not present

## 2019-09-11 DIAGNOSIS — J45909 Unspecified asthma, uncomplicated: Secondary | ICD-10-CM | POA: Diagnosis not present

## 2019-09-11 DIAGNOSIS — I7300 Raynaud's syndrome without gangrene: Secondary | ICD-10-CM | POA: Diagnosis not present

## 2019-09-18 DIAGNOSIS — N189 Chronic kidney disease, unspecified: Secondary | ICD-10-CM | POA: Diagnosis not present

## 2019-09-18 DIAGNOSIS — M349 Systemic sclerosis, unspecified: Secondary | ICD-10-CM | POA: Diagnosis not present

## 2019-10-03 ENCOUNTER — Telehealth: Payer: Self-pay | Admitting: Medical

## 2019-10-03 MED ORDER — BREO ELLIPTA 200-25 MCG/INH IN AEPB: 1.0000 | INHALATION_SPRAY | Freq: Every day | RESPIRATORY_TRACT | 0 refills | Status: DC

## 2019-10-03 NOTE — Telephone Encounter (Signed)
Pt called for samples of Breo, advised we have one she can have but is due for appt.  She will call back and schedule

## 2019-10-13 ENCOUNTER — Ambulatory Visit (INDEPENDENT_AMBULATORY_CARE_PROVIDER_SITE_OTHER): Payer: Medicare PPO | Admitting: Family Medicine

## 2019-10-13 ENCOUNTER — Encounter: Payer: Self-pay | Admitting: Family Medicine

## 2019-10-13 ENCOUNTER — Other Ambulatory Visit: Payer: Self-pay

## 2019-10-13 VITALS — BP 100/64 | HR 74 | Temp 98.4°F | Wt 111.8 lb

## 2019-10-13 DIAGNOSIS — R3989 Other symptoms and signs involving the genitourinary system: Secondary | ICD-10-CM | POA: Diagnosis not present

## 2019-10-13 DIAGNOSIS — N3001 Acute cystitis with hematuria: Secondary | ICD-10-CM | POA: Diagnosis not present

## 2019-10-13 LAB — POCT URINALYSIS DIP (PROADVANTAGE DEVICE)
Bilirubin, UA: NEGATIVE
Glucose, UA: NEGATIVE mg/dL
Ketones, POC UA: NEGATIVE mg/dL
Leukocytes, UA: NEGATIVE
Nitrite, UA: NEGATIVE
Protein Ur, POC: 30 mg/dL — AB
Specific Gravity, Urine: 1.02
Urobilinogen, Ur: 0.2
pH, UA: 6 (ref 5.0–8.0)

## 2019-10-13 MED ORDER — DOXYCYCLINE HYCLATE 100 MG PO TABS: 100.0000 mg | ORAL_TABLET | Freq: Two times a day (BID) | ORAL | 0 refills | Status: DC

## 2019-10-13 MED ORDER — SULFAMETHOXAZOLE-TRIMETHOPRIM 800-160 MG PO TABS: 1.0000 | ORAL_TABLET | Freq: Two times a day (BID) | ORAL | 0 refills | Status: DC

## 2019-10-13 NOTE — Progress Notes (Signed)
   Subjective:    Patient ID: [name], female    DOB: Mar 12, 1956, 63 y.o.   MRN: [id number]  HPI She states that earlier today she noted frequency, dysuria but no fever, chills or abdominal pain.   Review of Systems     Objective:   Physical Exam Alert and in no distress.  Urine microscopic did show red blood cells.       Assessment & Plan:  Acute cystitis with hematuria - Plan: doxycycline (VIBRA-TABS) 100 MG tablet, DISCONTINUED: sulfamethoxazole-trimethoprim (BACTRIM DS) 800-160 MG tablet  Possible urinary tract infection - Plan: POCT Urinalysis DIP (Proadvantage Device) She is to return here in 2 weeks for recheck on her urine.

## 2019-10-27 ENCOUNTER — Encounter: Payer: Self-pay | Admitting: Family Medicine

## 2019-10-27 ENCOUNTER — Ambulatory Visit (INDEPENDENT_AMBULATORY_CARE_PROVIDER_SITE_OTHER): Payer: Medicare PPO | Admitting: Family Medicine

## 2019-10-27 ENCOUNTER — Other Ambulatory Visit: Payer: Self-pay

## 2019-10-27 VITALS — BP 98/68 | HR 76 | Temp 97.3°F | Wt 112.6 lb

## 2019-10-27 DIAGNOSIS — Z23 Encounter for immunization: Secondary | ICD-10-CM | POA: Diagnosis not present

## 2019-10-27 DIAGNOSIS — N3001 Acute cystitis with hematuria: Secondary | ICD-10-CM

## 2019-10-27 LAB — POCT URINALYSIS DIP (PROADVANTAGE DEVICE)
Bilirubin, UA: NEGATIVE
Blood, UA: NEGATIVE
Glucose, UA: NEGATIVE mg/dL
Ketones, POC UA: NEGATIVE mg/dL
Leukocytes, UA: NEGATIVE
Nitrite, UA: NEGATIVE
Protein Ur, POC: NEGATIVE mg/dL
Specific Gravity, Urine: 1.01
Urobilinogen, Ur: 0.2
pH, UA: 6 (ref 5.0–8.0)

## 2019-10-27 NOTE — Progress Notes (Signed)
   Subjective:    Patient ID: [name], female    DOB: 04-May-1956, 63 y.o.   MRN: [id number]  HPI She is here for a recheck.  She has had no more urinary symptoms. She is interested in a flu shot. Review of Systems     Objective:   Physical Exam Alert and in no distress.  Her urinalysis is negative.       Assessment & Plan:  Acute cystitis with hematuria - Plan: POCT Urinalysis DIP (Proadvantage Device), CANCELED: Urinalysis Dipstick  Need for influenza vaccination - Plan: Flu Vaccine QUAD 6+ mos PF IM (Fluarix Quad PF)

## 2019-11-02 DIAGNOSIS — N1831 Chronic kidney disease, stage 3a: Secondary | ICD-10-CM | POA: Diagnosis not present

## 2019-11-08 DIAGNOSIS — M9080 Osteopathy in diseases classified elsewhere, unspecified site: Secondary | ICD-10-CM | POA: Diagnosis not present

## 2019-11-08 DIAGNOSIS — E889 Metabolic disorder, unspecified: Secondary | ICD-10-CM | POA: Diagnosis not present

## 2019-11-08 DIAGNOSIS — N1832 Chronic kidney disease, stage 3b: Secondary | ICD-10-CM | POA: Diagnosis not present

## 2019-11-08 DIAGNOSIS — I129 Hypertensive chronic kidney disease with stage 1 through stage 4 chronic kidney disease, or unspecified chronic kidney disease: Secondary | ICD-10-CM | POA: Diagnosis not present

## 2019-11-08 DIAGNOSIS — D631 Anemia in chronic kidney disease: Secondary | ICD-10-CM | POA: Diagnosis not present

## 2019-11-22 ENCOUNTER — Telehealth: Payer: Self-pay | Admitting: Medical

## 2019-11-22 MED ORDER — HYOSCYAMINE SULFATE ER 0.375 MG PO TB12: 0.3750 mg | ORAL_TABLET | Freq: Two times a day (BID) | ORAL | 0 refills | Status: DC

## 2019-11-22 NOTE — Telephone Encounter (Signed)
Let her know that I called in a stomach spasm medication.  If that does not work then she needs to schedule an appointment

## 2019-11-22 NOTE — Telephone Encounter (Signed)
Pt was advised [facility] 

## 2019-11-22 NOTE — Telephone Encounter (Signed)
Pt called and said she spoke wit you on call Saturday about her stomach and she said she is still having the same issues with the pain, and bloating. She wants to see if you could recommend anything else

## 2019-11-29 ENCOUNTER — Telehealth: Payer: Self-pay | Admitting: Medical

## 2019-11-29 NOTE — Telephone Encounter (Signed)
  Pt scheduled for appointment tomorrow She has had diarrhea off and on x 2 weeks and she is calling to see if she can still come in for her appointment  She did speak with Dr. Redmond School about this, she feels like this is colitis Flare up  No other symptoms

## 2019-11-29 NOTE — Telephone Encounter (Signed)
Pt informed

## 2019-11-29 NOTE — Telephone Encounter (Signed)
I am fine with appt unless she has respiratory /covid symptoms

## 2019-11-30 ENCOUNTER — Encounter: Payer: Self-pay | Admitting: Medical

## 2019-11-30 ENCOUNTER — Other Ambulatory Visit: Payer: Self-pay

## 2019-11-30 ENCOUNTER — Ambulatory Visit (INDEPENDENT_AMBULATORY_CARE_PROVIDER_SITE_OTHER): Payer: Medicare PPO | Admitting: Medical

## 2019-11-30 ENCOUNTER — Telehealth: Payer: Self-pay | Admitting: Medical

## 2019-11-30 VITALS — BP 100/60 | HR 74 | Temp 98.5°F | Ht 64.0 in | Wt 113.4 lb

## 2019-11-30 DIAGNOSIS — Z8719 Personal history of other diseases of the digestive system: Secondary | ICD-10-CM | POA: Diagnosis not present

## 2019-11-30 DIAGNOSIS — R197 Diarrhea, unspecified: Secondary | ICD-10-CM | POA: Diagnosis not present

## 2019-11-30 DIAGNOSIS — R109 Unspecified abdominal pain: Secondary | ICD-10-CM

## 2019-11-30 LAB — POCT URINALYSIS DIP (PROADVANTAGE DEVICE)
Bilirubin, UA: NEGATIVE
Blood, UA: NEGATIVE
Glucose, UA: NEGATIVE mg/dL
Ketones, POC UA: NEGATIVE mg/dL
Leukocytes, UA: NEGATIVE
Nitrite, UA: NEGATIVE
Protein Ur, POC: NEGATIVE mg/dL
Specific Gravity, Urine: 1.01
Urobilinogen, Ur: NEGATIVE
pH, UA: 6 (ref 5.0–8.0)

## 2019-11-30 MED ORDER — HYDROCODONE-ACETAMINOPHEN 5-325 MG PO TABS: 1.0000 | ORAL_TABLET | Freq: Four times a day (QID) | ORAL | 0 refills | Status: DC | PRN

## 2019-11-30 NOTE — Patient Instructions (Addendum)
Encounter Diagnoses  Name Primary?  . Diarrhea of presumed infectious origin Yes  . Abdominal pain, unspecified abdominal location     Recommendations:  We will check labs today  Take home the stool collection kit for stool sample and return sample  For the next few days, drink at least 1.5 liter of clear fluids such as water, G2 gatorade, gingerale, soup broth  Try and not eat any solid food the next 24 hours  Then use brat diet - banans, rice, applesauce, toast, soup, etc, bland easily digestible foods in small portions  For the next 4-5 days STOP Lisinopril temporarily  Check your blood pressures, and goal is to remain around 110/65-120/70.  If you are running lower than this, we may need to temporarily cut down on Amlodipine too.  We will call with lab results, and we may need to get a CT scan of your abdomen  If you have much worse symptoms in the next 48 hours, worse pain, fever, bloody stool, uncontrollable nausea or vomiting, then go to the hospital   You have a history of clostridium difficile infection and ischemic bowel issue back in 2017    Ischemic Colitis  Ischemic colitis is damage to the large intestine due to reduced blood flow (ischemia) to the colon. The colon is the last section of the large intestine, where stool is formed. The reduced blood flow may lead to the death of cells (necrosis) in the lining of the colon, damaging the colon and often causing bleeding. Most cases of ischemic colitis clear up in a few days with treatment. In other cases, blood flow does not improve, and parts of the colon start to die. This is extremely serious and even life-threatening. If this happens, surgery may be required. In some cases, parts of the colon may need to be removed. What are the causes? Ischemic colitis results from a decrease in the blood supply to the colon. Many conditions can cause this, such as:  Heart problems that reduce blood flow to the arteries that  supply the colon. These include problems such as coronary heart disease, peripheral vascular disease, atrial fibrillation, and congestive heart failure.  Low blood pressure from: ? An infection that spreads to the blood (sepsis). ? Dehydration or bleeding (shock).  Drugs that narrow blood vessels (vasoconstrictors). Sometimes the cause is not known. What increases the risk? You are more likely to develop this condition if:  You are 110 years of age or older.  You are female.  You have another medical condition, such as: ? Heart disease. ? Diabetes. ? Kidney disease that requires you to be on dialysis. ? A disease that causes blood clots.  You are frequently constipated.  You have had surgery on the heart, blood vessels (such as the aorta), or colon.  You take certain medicines or drugs, such as: ? Medicines that suppress your immune system (immunomodulators). ? Medicines that cause constipation. ? Illegal drugs, such as cocaine or methamphetamines.  You get an extreme amount of exercise from long-distance bike riding or running. What are the signs or symptoms? Symptoms of this condition start suddenly and may include:  Dull pain, usually on the left side of the abdomen.  Tenderness of the abdomen.  Abdomen (abdominal) cramps.  An urgent need to have a bowel movement.  Loose, bloody stools with clots of dark or bright red blood.  Nausea and vomiting.  Fever.  Weakness, fatigue, and confusion. How is this diagnosed? This condition may be diagnosed  based on:  Your symptoms, your medical history, and a physical exam.  Tests to find out more about your condition and to rule out other causes of pain and bleeding. These tests may include: ? Blood tests to check for clotting, blood loss, and low proteins in your blood. ? CT scan of the colon. ? A procedure to examine the inside of your colon using a scope that is passed through the rectum (colonoscopy). Colonoscopy is  the most important diagnostic test. During this test, your health care provider may take a small piece of tissue from your colon to be examined under a microscope (biopsy). How is this treated? You may be hospitalized for treatment. Treatment usually includes:  Not eating or drinking anything. This allows the colon to rest.  IV fluids to maintain blood pressure, regulate blood minerals (electrolytes), and provide nutrition.  Having a tube inserted into your stomach through your nose (nasogastric tube) to drain your stomach.  IV antibiotic medicines. These may be used if an infection is suspected.  Stopping or changing medicines that may be causing the condition. You may need surgery if your condition is severe or if it gets worse or does not get better after a few days. Parts of the colon that will not recover may need to be removed. In some cases, a procedure is also done to attach the healthy part of the colon to the outer wall of the abdomen to drain stool (colostomy). Follow these instructions at home:  Follow instructions from your health care provider about eating or drinking restrictions.  Drink enough fluid to keep your urine clear or pale yellow.  Take over-the-counter and prescription medicines only as told by your health care provider.  Return to your normal activities as told by your health care provider. Ask your health care provider what activities are safe for you.  Do not use any products that contain nicotine or tobacco, such as cigarettes and e-cigarettes. If you need help quitting, ask your health care provider.  Keep all follow-up visits as told by your health care provider. This is important. Contact a health care provider if:  You have blood in your stool.  You have abdominal pain or cramps.  You have constipation.  You have nausea or vomiting. Get help right away if:  You have a moderate to large amount of loose, bloody stools with clots of dark or bright  red blood.  You have severe abdominal pain.  Your abdominal pain has not improved after 24 hours.  You have a fever.  You have not been able to have a bowel movement, and you are in pain and vomiting.  You have shortness of breath.  You are very tired (lethargic) or have confusion. Summary  Ischemic colitis is damage to the large intestine due to reduced blood flow (ischemia) to the colon.  Some of the symptoms of this condition include abdominal pain or tenderness, bloody stools, and an urgent need to have a bowel movement.  Diagnosis usually includes a procedure to examine the inside of the colon using a scope that is passed through the rectum (colonoscopy). This information is not intended to replace advice given to you by your health care provider. Make sure you discuss any questions you have with your health care provider. Document Released: 02/01/2017 Document Revised: 04/07/2019 Document Reviewed: 02/01/2017 Elsevier Patient Education  2020 [employer].      Colitis  Colitis is inflammation of the colon. Colitis may last a short time (  be acute), or it may last a long time (become chronic). What are the causes? This condition may be caused by:  Viruses.  Bacteria.  Reaction to medicine.  Certain autoimmune diseases such as Crohn's disease or ulcerative colitis.  Radiation treatment.  Decreased blood flow to the bowel (ischemia). What are the signs or symptoms? Symptoms of this condition include:  Watery diarrhea.  Passing bloody or tarry stool.  Pain.  Fever.  Vomiting.  Tiredness (fatigue).  Weight loss.  Bloating.  Abdominal pain.  Having fewer bowel movements than usual.  A strong and sudden urge to have a bowel movement.  Feeling like the bowel is not empty after a bowel movement. How is this diagnosed? This condition is diagnosed with a stool test or a blood test. You may also have other tests, such as:  X-rays.  CT  scan.  Colonoscopy.  Endoscopy.  Biopsy. How is this treated? Treatment for this condition depends on the cause. The condition may be treated by:  Resting the bowel. This involves not eating or drinking for a period of time.  Fluids that are given through an IV.  Medicine for pain and diarrhea.  Antibiotic medicines.  Cortisone medicines.  Surgery. Follow these instructions at home: Eating and drinking   Follow instructions from your health care provider about eating or drinking restrictions.  Drink enough fluid to keep your urine pale yellow.  Work with a dietitian to determine which foods cause your condition to flare up.  Avoid foods that cause flare-ups.  Eat a well-balanced diet. General instructions  If you were prescribed an antibiotic medicine, take it as told by your health care provider. Do not stop taking the antibiotic even if you start to feel better.  Take over-the-counter and prescription medicines only as told by your health care provider.  Keep all follow-up visits as told by your health care provider. This is important. Contact a health care provider if:  Your symptoms do not go away.  You develop new symptoms. Get help right away if you:  Have a fever that does not go away with treatment.  Develop chills.  Have extreme weakness, fainting, or dehydration.  Have repeated vomiting.  Develop severe pain in your abdomen.  Pass bloody or tarry stool. Summary  Colitis is inflammation of the colon. Colitis may last a short time (be acute), or it may last a long time (become chronic).  Treatment for this condition depends on the cause and may include resting the bowel, taking medicines, or having surgery.  If you were prescribed an antibiotic medicine, take it as told by your health care provider. Do not stop taking the antibiotic even if you start to feel better.  Get help right away if you develop severe pain in your abdomen.  Keep all  follow-up visits as told by your health care provider. This is important. This information is not intended to replace advice given to you by your health care provider. Make sure you discuss any questions you have with your health care provider. Document Released: 01/21/2005 Document Revised: 06/16/2018 Document Reviewed: 06/16/2018 Elsevier Patient Education  2020 [employer].

## 2019-11-30 NOTE — Progress Notes (Addendum)
Subjective: Chief Complaint  Patient presents with  . Medication Management   Here for diarrhea, belly pain.  He has hx/o scleroderma, raynauds, hypertension, CKD, and hx/o ischemic colitis in 2017.      Having some diarrhea since 11/17/2019.   Had a lot of diarrhea on 11/21 and 11/22, but then since then its been intermittent.  somedays more loose stools, some days less.  Had no loose stool yesterday then this morning it started up again.   No blood in stool.   Has had belly pain all over.   She called in last week the day before thankgsigiving and Dr. Redmond [employer] called out some Levbid.  This makes her sleepy, but has been taking this BID since last week about a week ago.   Had low grade fever the first few days.  Denies any recent URI symptoms, no recent cough, no sore throat, no chills, no chest congestion.  No sick contacts.   No recent travel.  Worried about colitis, 4 years ago.  Had similar symptoms then.  Hasn't eat much other than chicken broth and jello in recent days.    She has had recent antibiotic use end of October with Doxycycline for urinary tract infection.  No other aggravating or relieving factors. No other complaint.   Past Medical History:  Diagnosis Date  . Allergic rhinitis   . Asthma   . Atherosclerosis   . Chronic kidney disease (CKD), stage III (moderate)   . Esophageal stricture   . GERD (gastroesophageal reflux disease)   . History of anemia due to chronic kidney disease   . History of colitis 2017  . Hypertension   . Metabolic bone disease   . Raynaud disease   . Scleroderma ([facility])    Current Outpatient Medications on File Prior to Visit  Medication Sig Dispense Refill  . acetaminophen (TYLENOL) 500 MG tablet Take 1,000 mg by mouth every 6 (six) hours as needed for mild pain.    [name] albuterol (PROAIR HFA) 108 (90 Base) MCG/ACT inhaler Inhale 2 puffs into the lungs every 6 (six) hours as needed for wheezing. 1 Inhaler 1  . albuterol (PROVENTIL) (2.5 MG/3ML)  0.083% nebulizer solution Take 3 mLs (2.5 mg total) by nebulization every 6 (six) hours as needed for wheezing or shortness of breath. 75 mL 1  . amLODipine (NORVASC) 5 MG tablet Take 5 mg by mouth 2 (two) times daily.     . fluticasone (FLONASE) 50 MCG/ACT nasal spray Place 1 spray into both nostrils daily as needed (for nasal congestion). Reported on 07/16/2016    . fluticasone furoate-vilanterol (BREO ELLIPTA) 200-25 MCG/INH AEPB Inhale 1 puff into the lungs daily. 1 each 0  . hyoscyamine (LEVBID) 0.375 MG 12 hr tablet Take 1 tablet (0.375 mg total) by mouth 2 (two) times daily. 10 tablet 0  . omeprazole (PRILOSEC) 20 MG capsule Take 20 mg by mouth 2 (two) times daily.      . pseudoephedrine (SUDAFED) 30 MG tablet Take 30 mg by mouth every 8 (eight) hours as needed for congestion. Reported on 07/16/2016    . [DISCONTINUED] famotidine (PEPCID) 20 MG tablet Take 20 mg by mouth 2 (two) times daily.       No current facility-administered medications on file prior to visit.    ROS as in subjective   Objective: BP 100/60   Pulse 74   Temp 98.5 F (36.9 C)   Ht '5\' 4"'$  (1.626 m)   Wt 113 lb 6.4 oz (51.4  kg)   SpO2 96%   BMI 19.47 kg/m   Wt Readings from Last 3 Encounters:  11/30/19 113 lb 6.4 oz (51.4 kg)  10/27/19 112 lb 9.6 oz (51.1 kg)  10/13/19 111 lb 12.8 oz (50.7 kg)   BP Readings from Last 3 Encounters:  11/30/19 100/60  10/27/19 98/68  10/13/19 100/64    Gen: wd, wn, nad Back nontender Abdomen: somewhat reduced bs, tender LLQ, no rebound, no guardinr, no mass, no organomegaly No skin abnormalities of bruising or erythema Not diaphoretic Pulses 2+ No edema    Assessment: Encounter Diagnoses  Name Primary?  . Diarrhea of presumed infectious origin Yes  . Abdominal pain, unspecified abdominal location   . History of ischemic colitis      Plan: Discussed her concerns.  Reviewed 2017 hospitalization including findings of ischemic colitis and c-diff, no findings of  diverticulosis on colonoscopy.   At that time part of the issue was hypotension.  Recommendations:  We will check labs today  Take home the stool collection kit for stool sample and return sample  For the next few days, drink at least 1.5 liter of clear fluids such as water, G2 gatorade, gingerale, soup broth  Try and not eat any solid food the next 24 hours  Then use brat diet - bananas, rice, applesauce, toast, soup, etc, bland easily digestible foods in small portions  For the next 4-5 days STOP Lisinopril temporarily  Check your blood pressures, and goal is to remain around 110/65-120/70.  If you are running lower than this, we may need to temporarily cut down on Amlodipine too.  We will call with lab results, and we may need to get a CT scan of your abdomen  If you have much worse symptoms in the next 48 hours, worse pain, fever, bloody stool, uncontrollable nausea or vomiting, then go to the hospital   You have a history of clostridium difficile infection and ischemic bowel issue back in 2017   F/u pending labs, possible CT abdomen/pelvis  Nayeliz was seen today for medication management.  Diagnoses and all orders for this visit:  Diarrhea of presumed infectious origin -     Comprehensive metabolic panel -     CBC with Differential -     Cancel: GI Profile, Stool, PCR -     GI Profile, Stool, PCR  Abdominal pain, unspecified abdominal location -     Comprehensive metabolic panel -     CBC with Differential -     Cancel: GI Profile, Stool, PCR -     GI Profile, Stool, PCR  History of ischemic colitis  Other orders -     HYDROcodone-acetaminophen (NORCO) 5-325 MG tablet; Take 1 tablet by mouth every 6 (six) hours as needed.

## 2019-11-30 NOTE — Telephone Encounter (Signed)
Pain, abdominal pain.

## 2019-11-30 NOTE — Telephone Encounter (Signed)
[facility] called and needs pt diagnosis in order to fill her Hydrocodone.

## 2019-11-30 NOTE — Addendum Note (Signed)
Addended by: Edgar Frisk on: 11/30/2019 12:15 PM   Modules accepted: Orders

## 2019-12-01 ENCOUNTER — Emergency Department (HOSPITAL_COMMUNITY): Payer: Medicare PPO

## 2019-12-01 ENCOUNTER — Encounter: Payer: Self-pay | Admitting: Physician Assistant

## 2019-12-01 ENCOUNTER — Ambulatory Visit: Payer: Medicare PPO | Admitting: Nurse Practitioner

## 2019-12-01 ENCOUNTER — Encounter: Payer: Self-pay | Admitting: Nurse Practitioner

## 2019-12-01 ENCOUNTER — Inpatient Hospital Stay (HOSPITAL_COMMUNITY)
Admission: EM | Admit: 2019-12-01 | Discharge: 2019-12-08 | DRG: 391 | Disposition: A | Discharge status: Home Health | Payer: Medicare PPO | Attending: Internal Medicine | Admitting: Internal Medicine

## 2019-12-01 ENCOUNTER — Other Ambulatory Visit: Payer: Self-pay

## 2019-12-01 ENCOUNTER — Encounter (HOSPITAL_COMMUNITY): Payer: Self-pay

## 2019-12-01 VITALS — BP 112/68 | HR 73 | Temp 98.0°F | Ht 63.0 in | Wt 111.6 lb

## 2019-12-01 DIAGNOSIS — I959 Hypotension, unspecified: Secondary | ICD-10-CM | POA: Diagnosis not present

## 2019-12-01 DIAGNOSIS — R109 Unspecified abdominal pain: Secondary | ICD-10-CM | POA: Diagnosis not present

## 2019-12-01 DIAGNOSIS — N183 Chronic kidney disease, stage 3 unspecified: Secondary | ICD-10-CM | POA: Diagnosis present

## 2019-12-01 DIAGNOSIS — I129 Hypertensive chronic kidney disease with stage 1 through stage 4 chronic kidney disease, or unspecified chronic kidney disease: Secondary | ICD-10-CM | POA: Diagnosis present

## 2019-12-01 DIAGNOSIS — J948 Other specified pleural conditions: Secondary | ICD-10-CM | POA: Diagnosis not present

## 2019-12-01 DIAGNOSIS — Z789 Other specified health status: Secondary | ICD-10-CM

## 2019-12-01 DIAGNOSIS — R188 Other ascites: Secondary | ICD-10-CM | POA: Diagnosis not present

## 2019-12-01 DIAGNOSIS — K219 Gastro-esophageal reflux disease without esophagitis: Secondary | ICD-10-CM | POA: Diagnosis present

## 2019-12-01 DIAGNOSIS — R0602 Shortness of breath: Secondary | ICD-10-CM

## 2019-12-01 DIAGNOSIS — J9811 Atelectasis: Secondary | ICD-10-CM | POA: Diagnosis not present

## 2019-12-01 DIAGNOSIS — E8889 Other specified metabolic disorders: Secondary | ICD-10-CM | POA: Diagnosis present

## 2019-12-01 DIAGNOSIS — E860 Dehydration: Secondary | ICD-10-CM | POA: Diagnosis present

## 2019-12-01 DIAGNOSIS — Z8249 Family history of ischemic heart disease and other diseases of the circulatory system: Secondary | ICD-10-CM

## 2019-12-01 DIAGNOSIS — J984 Other disorders of lung: Secondary | ICD-10-CM | POA: Diagnosis not present

## 2019-12-01 DIAGNOSIS — K529 Noninfective gastroenteritis and colitis, unspecified: Secondary | ICD-10-CM | POA: Diagnosis present

## 2019-12-01 DIAGNOSIS — Z79899 Other long term (current) drug therapy: Secondary | ICD-10-CM

## 2019-12-01 DIAGNOSIS — N179 Acute kidney failure, unspecified: Secondary | ICD-10-CM | POA: Diagnosis present

## 2019-12-01 DIAGNOSIS — J328 Other chronic sinusitis: Secondary | ICD-10-CM | POA: Diagnosis present

## 2019-12-01 DIAGNOSIS — J690 Pneumonitis due to inhalation of food and vomit: Secondary | ICD-10-CM | POA: Diagnosis present

## 2019-12-01 DIAGNOSIS — E871 Hypo-osmolality and hyponatremia: Secondary | ICD-10-CM

## 2019-12-01 DIAGNOSIS — I9589 Other hypotension: Secondary | ICD-10-CM | POA: Diagnosis present

## 2019-12-01 DIAGNOSIS — J309 Allergic rhinitis, unspecified: Secondary | ICD-10-CM | POA: Diagnosis present

## 2019-12-01 DIAGNOSIS — J90 Pleural effusion, not elsewhere classified: Secondary | ICD-10-CM | POA: Diagnosis present

## 2019-12-01 DIAGNOSIS — I700 Atherosclerosis of aorta: Secondary | ICD-10-CM | POA: Diagnosis present

## 2019-12-01 DIAGNOSIS — Z20828 Contact with and (suspected) exposure to other viral communicable diseases: Secondary | ICD-10-CM | POA: Diagnosis present

## 2019-12-01 DIAGNOSIS — J9601 Acute respiratory failure with hypoxia: Secondary | ICD-10-CM | POA: Diagnosis not present

## 2019-12-01 DIAGNOSIS — M349 Systemic sclerosis, unspecified: Secondary | ICD-10-CM | POA: Diagnosis not present

## 2019-12-01 DIAGNOSIS — R531 Weakness: Secondary | ICD-10-CM

## 2019-12-01 DIAGNOSIS — Z880 Allergy status to penicillin: Secondary | ICD-10-CM

## 2019-12-01 DIAGNOSIS — I7300 Raynaud's syndrome without gangrene: Secondary | ICD-10-CM | POA: Diagnosis present

## 2019-12-01 DIAGNOSIS — R1310 Dysphagia, unspecified: Secondary | ICD-10-CM | POA: Diagnosis present

## 2019-12-01 DIAGNOSIS — K222 Esophageal obstruction: Secondary | ICD-10-CM | POA: Diagnosis present

## 2019-12-01 DIAGNOSIS — Z7951 Long term (current) use of inhaled steroids: Secondary | ICD-10-CM

## 2019-12-01 DIAGNOSIS — D509 Iron deficiency anemia, unspecified: Secondary | ICD-10-CM | POA: Diagnosis present

## 2019-12-01 DIAGNOSIS — A084 Viral intestinal infection, unspecified: Secondary | ICD-10-CM | POA: Diagnosis not present

## 2019-12-01 DIAGNOSIS — R197 Diarrhea, unspecified: Secondary | ICD-10-CM

## 2019-12-01 DIAGNOSIS — K760 Fatty (change of) liver, not elsewhere classified: Secondary | ICD-10-CM | POA: Diagnosis not present

## 2019-12-01 DIAGNOSIS — Z825 Family history of asthma and other chronic lower respiratory diseases: Secondary | ICD-10-CM

## 2019-12-01 DIAGNOSIS — R0600 Dyspnea, unspecified: Secondary | ICD-10-CM

## 2019-12-01 DIAGNOSIS — D631 Anemia in chronic kidney disease: Secondary | ICD-10-CM | POA: Diagnosis present

## 2019-12-01 DIAGNOSIS — E875 Hyperkalemia: Secondary | ICD-10-CM | POA: Diagnosis present

## 2019-12-01 DIAGNOSIS — Z885 Allergy status to narcotic agent status: Secondary | ICD-10-CM

## 2019-12-01 DIAGNOSIS — Z881 Allergy status to other antibiotic agents status: Secondary | ICD-10-CM

## 2019-12-01 DIAGNOSIS — Z9889 Other specified postprocedural states: Secondary | ICD-10-CM

## 2019-12-01 DIAGNOSIS — J181 Lobar pneumonia, unspecified organism: Secondary | ICD-10-CM | POA: Diagnosis not present

## 2019-12-01 LAB — COMPREHENSIVE METABOLIC PANEL WITH GFR
ALT: 15 U/L (ref 0–44)
AST: 20 U/L (ref 15–41)
Albumin: 3 g/dL — ABNORMAL LOW (ref 3.5–5.0)
Alkaline Phosphatase: 72 U/L (ref 38–126)
Anion gap: 14 (ref 5–15)
BUN: 21 mg/dL (ref 8–23)
CO2: 19 mmol/L — ABNORMAL LOW (ref 22–32)
Calcium: 8.2 mg/dL — ABNORMAL LOW (ref 8.9–10.3)
Chloride: 90 mmol/L — ABNORMAL LOW (ref 98–111)
Creatinine, Ser: 1.96 mg/dL — ABNORMAL HIGH (ref 0.44–1.00)
GFR calc Af Amer: 31 mL/min — ABNORMAL LOW
GFR calc non Af Amer: 27 mL/min — ABNORMAL LOW
Glucose, Bld: 91 mg/dL (ref 70–99)
Potassium: 5.2 mmol/L — ABNORMAL HIGH (ref 3.5–5.1)
Sodium: 123 mmol/L — ABNORMAL LOW (ref 135–145)
Total Bilirubin: 1.1 mg/dL (ref 0.3–1.2)
Total Protein: 6.8 g/dL (ref 6.5–8.1)

## 2019-12-01 LAB — CBC WITH DIFFERENTIAL/PLATELET
Basophils Absolute: 0.1 10*3/uL (ref 0.0–0.2)
Basos: 0 %
EOS (ABSOLUTE): 0 10*3/uL (ref 0.0–0.4)
Eos: 0 %
Hematocrit: 30.6 % — ABNORMAL LOW (ref 34.0–46.6)
Hemoglobin: 9.9 g/dL — ABNORMAL LOW (ref 11.1–15.9)
Immature Grans (Abs): 0.1 10*3/uL (ref 0.0–0.1)
Immature Granulocytes: 1 %
Lymphocytes Absolute: 0.9 10*3/uL (ref 0.7–3.1)
Lymphs: 6 %
MCH: 29.6 pg (ref 26.6–33.0)
MCHC: 32.4 g/dL (ref 31.5–35.7)
MCV: 91 fL (ref 79–97)
Monocytes Absolute: 1.3 10*3/uL — ABNORMAL HIGH (ref 0.1–0.9)
Monocytes: 8 %
Neutrophils Absolute: 14.2 10*3/uL — ABNORMAL HIGH (ref 1.4–7.0)
Neutrophils: 85 %
Platelets: 727 10*3/uL — ABNORMAL HIGH (ref 150–450)
RBC: 3.35 x10E6/uL — ABNORMAL LOW (ref 3.77–5.28)
RDW: 11.9 % (ref 11.7–15.4)
WBC: 16.6 10*3/uL — ABNORMAL HIGH (ref 3.4–10.8)

## 2019-12-01 LAB — COMPREHENSIVE METABOLIC PANEL
ALT: 13 IU/L (ref 0–32)
AST: 18 IU/L (ref 0–40)
Albumin/Globulin Ratio: 1 — ABNORMAL LOW (ref 1.2–2.2)
Albumin: 3.2 g/dL — ABNORMAL LOW (ref 3.8–4.8)
Alkaline Phosphatase: 93 IU/L (ref 39–117)
BUN/Creatinine Ratio: 9 — ABNORMAL LOW (ref 12–28)
BUN: 14 mg/dL (ref 8–27)
Bilirubin Total: 0.4 mg/dL (ref 0.0–1.2)
CO2: 19 mmol/L — ABNORMAL LOW (ref 20–29)
Calcium: 8.2 mg/dL — ABNORMAL LOW (ref 8.7–10.3)
Chloride: 86 mmol/L — ABNORMAL LOW (ref 96–106)
Creatinine, Ser: 1.55 mg/dL — ABNORMAL HIGH (ref 0.57–1.00)
GFR calc Af Amer: 41 mL/min/{1.73_m2} — ABNORMAL LOW (ref >59)
GFR calc non Af Amer: 36 mL/min/{1.73_m2} — ABNORMAL LOW (ref >59)
Globulin, Total: 3.3 g/dL (ref 1.5–4.5)
Glucose: 100 mg/dL — ABNORMAL HIGH (ref 65–99)
Potassium: 5.8 mmol/L — ABNORMAL HIGH (ref 3.5–5.2)
Sodium: 122 mmol/L — ABNORMAL LOW (ref 134–144)
Total Protein: 6.5 g/dL (ref 6.0–8.5)

## 2019-12-01 LAB — CBC
HCT: 30.8 % — ABNORMAL LOW (ref 36.0–46.0)
Hemoglobin: 9.8 g/dL — ABNORMAL LOW (ref 12.0–15.0)
MCH: 29.1 pg (ref 26.0–34.0)
MCHC: 31.8 g/dL (ref 30.0–36.0)
MCV: 91.4 fL (ref 80.0–100.0)
Platelets: 728 10*3/uL — ABNORMAL HIGH (ref 150–400)
RBC: 3.37 MIL/uL — ABNORMAL LOW (ref 3.87–5.11)
RDW: 12.7 % (ref 11.5–15.5)
WBC: 15.4 10*3/uL — ABNORMAL HIGH (ref 4.0–10.5)
nRBC: 0 % (ref 0.0–0.2)

## 2019-12-01 LAB — BRAIN NATRIURETIC PEPTIDE: B Natriuretic Peptide: 137 pg/mL — ABNORMAL HIGH (ref 0.0–100.0)

## 2019-12-01 LAB — LIPASE, BLOOD: Lipase: 24 U/L (ref 11–51)

## 2019-12-01 MED ORDER — IOHEXOL 9 MG/ML PO SOLN: 500.0000 mL | ORAL | Status: DC

## 2019-12-01 MED ORDER — SODIUM CHLORIDE 0.9 % IV BOLUS: 1000.0000 mL | Freq: Once | INTRAVENOUS | Status: DC

## 2019-12-01 MED ORDER — SODIUM CHLORIDE 0.9% FLUSH: 3.0000 mL | Freq: Once | INTRAVENOUS | Status: DC

## 2019-12-01 MED ORDER — SODIUM CHLORIDE 0.9 % IV SOLN
Freq: Once | INTRAVENOUS | Status: AC
  Administered 2019-12-01: 23:00:00 via INTRAVENOUS

## 2019-12-01 NOTE — Telephone Encounter (Signed)
Error

## 2019-12-01 NOTE — ED Provider Notes (Signed)
[facility]-EMERGENCY DEPT Provider Note   CSN: [id number] Arrival date & time: 12/01/19  1456     History   Chief Complaint Chief Complaint  Patient presents with   Abdominal Pain   Diarrhea    HPI [name] is a 63 y.o. female with a past medical history of scleroderma, esophageal stricture, ischemic colitis, CKD 3, hypertension, who presents today for evaluation of diarrhea.  She reports that about 2 weeks ago she ate a suspicious-looking ice, sandwich and since then has had multiple episodes of diarrhea.  She denies any blood in her diarrhea.  She was seen by her PCP yesterday and GI today who referred her here.  Reports significant with fatigue.  She did have a colonoscopy with biopsies in July 2017 concerning for segmental colitis which was suspected to be ischemic at that time.  She reports that she has not vomited since yesterday.  He denies any fevers, cough, or shortness of breath.  No known sick contacts.       HPI  Past Medical History:  Diagnosis Date   Allergic rhinitis    Asthma    Atherosclerosis    Chronic kidney disease (CKD), stage III (moderate)    Esophageal stricture    GERD (gastroesophageal reflux disease)    History of anemia due to chronic kidney disease    History of colitis 2017   Hypertension    Metabolic bone disease    Raynaud disease    Scleroderma ([facility])     Patient Active Problem List   Diagnosis Date Noted   Colitis 12/01/2019   Diarrhea of presumed infectious origin 11/30/2019   Abdominal pain 11/30/2019   Estrogen deficiency 12/06/2018   Long term systemic steroid user 12/06/2018   History of colitis 12/23/2017   History of ischemic colitis 12/23/2017   Abnormal CT scan, chest 12/23/2017   Screening for breast cancer 12/23/2017   Vaccine counseling 12/23/2017   Atherosclerosis 12/23/2017   CRI (chronic renal insufficiency), stage 3 (moderate) 03/18/2017   Hypertensive  nephropathy 03/18/2017   Metabolic bone disease 03/18/2017   Anemia of renal disease 03/18/2017   Raynaud's disease without gangrene 03/18/2017   Hyponatremia 07/11/2016   GERD (gastroesophageal reflux disease) 12/04/2011   Chronic sinusitis 11/18/2011   Disorder resulting from impaired renal function 12/30/2009   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      Home Medications    Prior to Admission medications   Medication Sig Start Date End Date Taking? Authorizing Provider  acetaminophen (TYLENOL) 500 MG tablet Take 1,000 mg by mouth every 6 (six) hours as needed for mild pain.   Yes [provider]  albuterol (PROAIR HFA) 108 (90 Base) MCG/ACT inhaler Inhale 2 puffs into the lungs every 6 (six) hours as needed for wheezing. 12/06/18 04/30/21 Yes Tysinger, Kermit Baloavid S, PA-C  albuterol (PROVENTIL) (2.5 MG/3ML) 0.083% nebulizer solution Take 3 mLs (2.5 mg total) by nebulization every 6 (six) hours as needed for wheezing or shortness of  breath. 03/13/19  Yes Tysinger, Kermit Balo, PA-C  amLODipine (NORVASC) 5 MG tablet Take 5 mg by mouth 2 (two) times daily.  06/05/16  Yes [provider]  fluticasone (FLONASE) 50 MCG/ACT nasal spray Place 1 spray into both nostrils daily as needed (for nasal congestion). Reported on 07/16/2016   Yes [provider]  fluticasone furoate-vilanterol (BREO ELLIPTA) 200-25 MCG/INH AEPB Inhale 1 puff into the lungs daily. 10/03/19  Yes Tysinger, Kermit Balo, PA-C  lisinopril (ZESTRIL) 5 MG tablet Take  5 mg by mouth daily.   Yes [provider]  omeprazole (PRILOSEC) 20 MG capsule Take 20 mg by mouth 2 (two) times daily.     Yes [provider]  pseudoephedrine (SUDAFED) 30 MG tablet Take 30 mg by mouth every 8 (eight) hours as needed for congestion. Reported on 07/16/2016   Yes [provider]  HYDROcodone-acetaminophen (NORCO) 5-325 MG tablet Take 1 tablet by mouth every 6 (six) hours as needed. 11/30/19   Tysinger, Kermit Balo, PA-C  famotidine (PEPCID) 20 MG tablet Take 20 mg by mouth 2 (two) times daily.    03/18/12  [provider]    Family History Family History  Problem Relation Age of Onset   Heart failure Mother    Hepatitis Mother        C   Dementia Mother    COPD Mother        emphysema   Cancer Father        lung with mets to brain   Diabetes Sister    Hepatitis Brother    Cancer Sister        leukemia   Colon cancer Neg Hx     Social History Social History   Tobacco Use   Smoking status: Never Smoker   Smokeless tobacco: Never Used  Substance Use Topics   Alcohol use: No   Drug use: No     Allergies   Avelox [moxifloxacin hcl in nacl], Codeine, and Penicillins   Review of Systems Review of Systems  Constitutional: Negative for chills and fever.       Generally feels unwell.   Respiratory: Negative for shortness of breath.   Cardiovascular: Negative for chest pain.  Gastrointestinal: Positive for abdominal pain (Only before she has diarrhea), diarrhea, nausea and vomiting. Negative for blood in stool.  Musculoskeletal: Negative for back pain.  All other systems reviewed and are negative.    Physical Exam Updated Vital Signs BP 102/62 (BP Location: Right Arm)    Pulse 92    Temp 98.2 F (36.8 C) (Oral)    Resp 13    Ht 5\' 3"  (1.6 m)    Wt 50.6 kg    SpO2 96%    BMI 19.77 kg/m   Physical Exam Vitals signs and nursing note reviewed.  Constitutional:      Comments: Thin, chronically ill appearing    HENT:     Head: Normocephalic and atraumatic.     Mouth/Throat:     Mouth: Mucous membranes are moist.  Eyes:     Conjunctiva/sclera: Conjunctivae normal.  Neck:     Musculoskeletal: Neck supple.  Cardiovascular:     Rate and Rhythm: Normal rate and regular rhythm.     Heart sounds: Normal heart sounds. No murmur.  Pulmonary:     Effort: Pulmonary effort is normal. No respiratory distress.     Breath sounds: Normal breath sounds.  Abdominal:     General: Abdomen is flat. Bowel sounds are  normal.     Palpations: Abdomen is soft.     Tenderness: There is generalized abdominal tenderness.  Skin:    General: Skin is warm and dry.     Coloration: Skin is pale.  Neurological:     General: No focal deficit present.     Mental Status: She is alert.  Psychiatric:        Mood and Affect: Mood normal.        Behavior: Behavior normal.      ED Treatments / Results  Labs (all labs ordered are listed, but only abnormal results are displayed) Labs Reviewed  COMPREHENSIVE METABOLIC PANEL - Abnormal; Notable for the following components:      Result Value   Sodium 123 (*)    Potassium 5.2 (*)    Chloride 90 (*)    CO2 19 (*)    Creatinine, Ser 1.96 (*)    Calcium 8.2 (*)    Albumin 3.0 (*)    GFR calc non Af Amer 27 (*)    GFR calc Af Amer 31 (*)    All other components within normal limits  CBC - Abnormal; Notable for the following components:   WBC 15.4 (*)    RBC 3.37 (*)    Hemoglobin 9.8 (*)    HCT 30.8 (*)    Platelets 728 (*)    All other components within normal limits  BRAIN NATRIURETIC PEPTIDE - Abnormal; Notable for the following components:   B Natriuretic Peptide 137.0 (*)    All other components within normal limits  GASTROINTESTINAL PANEL BY PCR, STOOL (REPLACES STOOL CULTURE)  SARS CORONAVIRUS 2 (TAT 6-24 HRS)  LIPASE, BLOOD  URINALYSIS, ROUTINE W REFLEX MICROSCOPIC    EKG EKG Interpretation  Date/Time:  Friday December 01 2019 18:42:59  EST Ventricular Rate:  94 PR Interval:    QRS Duration: 115 QT Interval:  366 QTC Calculation: 458 R Axis:   -13 Text Interpretation: Incomplete analysis due to missing data in precordial lead(s) Missing lead(s): V6 Confirmed by Kristine Royal [phone]) on 12/01/2019 7:19:06 PM   Radiology Ct Abdomen Pelvis Wo Contrast  Result Date: 12/01/2019 CLINICAL DATA:  Abdominal pain and diarrhea. Pleural effusion. History of scleroderma EXAM: CT CHEST, ABDOMEN AND PELVIS WITHOUT CONTRAST TECHNIQUE: Multidetector CT imaging of the chest, abdomen and pelvis was performed following the standard protocol without IV contrast. COMPARISON:  CT chest 06/15/2019 and CT AP 06/24/2016. FINDINGS: CT CHEST FINDINGS Cardiovascular: The heart size is within normal limits. No pericardial effusion identified. Aortic atherosclerosis. Mediastinum/Nodes: Normal appearance of the thyroid gland. The trachea appears patent and is midline. Thin walled dilated esophagus with air-fluid level is again noted compatible with the clinical history of scleroderma. No enlarged axillary, supraclavicular, or mediastinal adenopathy. Lungs/Pleura: There is a large left pleural effusion, new compared with previous exam. Associated near complete atelectasis of the left lower lobe is identified. Loculated fluid extending along the oblique fissure is noted. Within the right lung there are multifocal areas of interlobular septal thickening and ground-glass attenuation involving the right lower lobe, right middle lobe, and peripheral right upper lobe. Musculoskeletal: No chest wall mass or suspicious bone lesions identified. CT ABDOMEN PELVIS FINDINGS Hepatobiliary: Hepatic steatosis. No focal liver abnormality is seen. No gallstones, gallbladder wall thickening, or biliary dilatation. Pancreas: Unremarkable. No pancreatic ductal dilatation or surrounding inflammatory changes. Spleen: Normal in size without focal abnormality. Adrenals/Urinary Tract: Normal  adrenal glands. Cyst within upper pole of the left kidney is incompletely characterized without IV  contrast measuring 1.1 cm, 65/4. No mass or hydronephrosis identified bilaterally. The urinary bladder is normal. Stomach/Bowel: Stomach is normal. Small bowel loops are unremarkable. Mild diffuse wall thickening involving the colon is noted particularly at the level of the hepatic flexure. At the a patent flexure there is mild pericolonic soft tissue stranding noted as well. No pneumatosis. Vascular/Lymphatic: Aortic atherosclerosis. No abdominopelvic adenopathy. Reproductive: Uterus and bilateral adnexa are unremarkable. Other: No free fluid or fluid collections. Musculoskeletal: No acute or significant osseous findings. IMPRESSION: 1. Large left pleural effusion is identified with near complete atelectasis of the left lower lobe. Etiology is indeterminate. New when compared with 06/15/2019. Consider further evaluation with diagnostic/therapeutic thoracentesis. 2. There are multifocal areas of interlobular septal thickening and ground-glass attenuation within the right lung which may reflect an inflammatory or infectious process. 3. Mild diffuse wall thickening and pericolonic soft tissue stranding involving the colon particularly at the level of the hepatic flexure. Findings compatible with colitis. 4. Aortic atherosclerosis. 5. Thin walled dilated esophagus with air-fluid level compatible with the clinical history of scleroderma. 6. Hepatic steatosis. Aortic Atherosclerosis (ICD10-I70.0). Electronically Signed   By: Kerby Moors [name]   On: 12/01/2019 19:26   Ct Chest Wo Contrast  Result Date: 12/01/2019 CLINICAL DATA:  Abdominal pain and diarrhea. Pleural effusion. History of scleroderma EXAM: CT CHEST, ABDOMEN AND PELVIS WITHOUT CONTRAST TECHNIQUE: Multidetector CT imaging of the chest, abdomen and pelvis was performed following the standard protocol without IV contrast. COMPARISON:  CT chest 06/15/2019 and  CT AP 06/24/2016. FINDINGS: CT CHEST FINDINGS Cardiovascular: The heart size is within normal limits. No pericardial effusion identified. Aortic atherosclerosis. Mediastinum/Nodes: Normal appearance of the thyroid gland. The trachea appears patent and is midline. Thin walled dilated esophagus with air-fluid level is again noted compatible with the clinical history of scleroderma. No enlarged axillary, supraclavicular, or mediastinal adenopathy. Lungs/Pleura: There is a large left pleural effusion, new compared with previous exam. Associated near complete atelectasis of the left lower lobe is identified. Loculated fluid extending along the oblique fissure is noted. Within the right lung there are multifocal areas of interlobular septal thickening and ground-glass attenuation involving the right lower lobe, right middle lobe, and peripheral right upper lobe. Musculoskeletal: No chest wall mass or suspicious bone lesions identified. CT ABDOMEN PELVIS FINDINGS Hepatobiliary: Hepatic steatosis. No focal liver abnormality is seen. No gallstones, gallbladder wall thickening, or biliary dilatation. Pancreas: Unremarkable. No pancreatic ductal dilatation or surrounding inflammatory changes. Spleen: Normal in size without focal abnormality. Adrenals/Urinary Tract: Normal adrenal glands. Cyst within upper pole of the left kidney is incompletely characterized without IV contrast measuring 1.1 cm, 65/4. No mass or hydronephrosis identified bilaterally. The urinary bladder is normal. Stomach/Bowel: Stomach is normal. Small bowel loops are unremarkable. Mild diffuse wall thickening involving the colon is noted particularly at the level of the hepatic flexure. At the a patent flexure there is mild pericolonic soft tissue stranding noted as well. No pneumatosis. Vascular/Lymphatic: Aortic atherosclerosis. No abdominopelvic adenopathy. Reproductive: Uterus and bilateral adnexa are unremarkable. Other: No free fluid or fluid  collections. Musculoskeletal: No acute or significant osseous findings. IMPRESSION: 1. Large left pleural effusion is identified with near complete atelectasis of the left lower lobe. Etiology is indeterminate. New when compared with 06/15/2019. Consider further evaluation with diagnostic/therapeutic thoracentesis. 2. There are multifocal areas of interlobular septal thickening and ground-glass attenuation within the right lung which may reflect an inflammatory or infectious process. 3. Mild diffuse wall thickening and pericolonic soft tissue stranding involving the  colon particularly at the level of the hepatic flexure. Findings compatible with colitis. 4. Aortic atherosclerosis. 5. Thin walled dilated esophagus with air-fluid level compatible with the clinical history of scleroderma. 6. Hepatic steatosis. Aortic Atherosclerosis (ICD10-I70.0). Electronically Signed   By: Signa Kell [name]   On: 12/01/2019 19:26    Procedures .Critical Care Performed by: Cristina Gong, PA-C Authorized by: Cristina Gong, PA-C   Critical care provider statement:    Critical care time (minutes):  45   Critical care was necessary to treat or prevent imminent or life-threatening deterioration of the following conditions:  Metabolic crisis   Critical care was time spent personally by me on the following activities:  Discussions with consultants, evaluation of patient's response to treatment, examination of patient, ordering and performing treatments and interventions, ordering and review of laboratory studies, ordering and review of radiographic studies, pulse oximetry, re-evaluation of patient's condition, obtaining history from patient or surrogate and review of old charts Comments:     Hyponatremia requiring admission   (including critical care time)  Medications Ordered in ED Medications  sodium chloride flush (NS) 0.9 % injection 3 mL (has no administration in time range)  0.9 %  sodium chloride  infusion ( Intravenous New Bag/Given (Non-Interop) 12/01/19 2235)     Initial Impression / Assessment and Plan / ED Course  I have reviewed the triage vital signs and the nursing notes.  Pertinent labs & imaging results that were available during my care of the patient were reviewed by me and considered in my medical decision making (see chart for details).  Clinical Course as of Nov 30 2326  Fri Dec 01, 2019  1710 Chart review, previous EF normal.    [EH]  1734 Was informed that patient refused to drink PO contrast.    [EH]  2033 Spoke with hosptialist who will see patient.    [EH]    Clinical Course User Index [EH] Cristina Gong, PA-C      Patient presents today for evaluation of hyponatremia and dehydration. She was seen at her GI office earlier today and was referred here.  Labs show she has a sodium of 123 with a potassium of 5.2.  Her creatinine is elevated at 1.96 with a GFR of 27.  She does have a leukocytosis at 15.4 with platelets of 728.  BNP is slightly elevated at 137.  Lipase is not elevated.  She is afebrile.  Blood pressures are soft in the high 90s.  CT abdomen pelvis was obtained without contrast due to kidney function.  On scouting imaging there was concern for pulmonary edema therefore CT chest was added on.  CT chest shows a large sided left pleural effusion with near collapse of the left lung with possible septations and groundglass opacities.  This appears new from previous scans.  CT scan of her abdomen and pelvis show findings consistent with colitis.  Her BM while in the department was well formed, does not meet testing requirements for c.diff.  She was given IV fluids and gentle rehydration.    I spoke with Dr. Zola Button who will admit the patient.  Covid test is pending.   Patient remained hemodynamically stable while in my care.   Final Clinical Impressions(s) / ED Diagnoses   Final diagnoses:  Hyponatremia  Dehydration  Pleural effusion  Colitis     ED Discharge Orders    None       Norman Clay 12/01/19 2335    Messick,  Noralyn Pick, MD 12/04/19 1036

## 2019-12-01 NOTE — H&P (Addendum)
History and Physical    [name] AOZ:[id number] DOB: 1956/08/12 DOA: 12/01/2019  PCP:  I have personally briefly reviewed the emergency room  Chief Complaint: Diarrhea for 4 days.  However she had a very small solid stool in the ED  HPI: [name] is a 63 y.o. female with medical history significant for scleroderma, esophageal stricture, hyper tension, chronic kidney disease stage III, asthma and anemia of chronic disease who presented with several loose bowel movements ongoing for the past several days.  Her appetite has been poor and estimates that she eats she has a bout of diarrhea.  She has not noted any blood in the stools.  She has had minimal pain but now the diarrhea is free of pain.. She has not had any fever or chills.  No headaches or dizziness has been ordered She has been generally weak and has had very little energy to move around. She denies any chest pain or shortness of breath at rest. She has a history of colitis but cannot explain whether it was inflammatory or infectious.  She had about 3 years ago for which she was hospitalized but been free of symptoms up until now. ED Course: Noted above.  Review of Systems: As per HPI otherwise 10 point review of systems negative.  SHe denies any significant weight loss during the last few weeks. All of the other review of systems is noted above and is otherwise unremarkable  Past Medical History:  Diagnosis Date   Allergic rhinitis    Asthma    Atherosclerosis    Chronic kidney disease (CKD), stage III (moderate)    Esophageal stricture    GERD (gastroesophageal reflux disease)    History of anemia due to chronic kidney disease    History of colitis 2017   Hypertension    Metabolic bone disease    Raynaud disease    Scleroderma (HCC)     Past Surgical History:  Procedure Laterality Date   CESAREAN SECTION     COLONOSCOPY WITH PROPOFOL N/A 07/14/2016   Procedure: COLONOSCOPY WITH PROPOFOL;   Surgeon: Rachael Fee, MD;  Location: [facility];  Service: Endoscopy;  Laterality: N/A;   HAND SURGERY     left; tendon repair   TONSILLECTOMY       reports that she has never smoked. She has never used smokeless tobacco. She reports that she does not drink alcohol or use drugs.  Allergies  Allergen Reactions   Avelox [Moxifloxacin Hcl In Nacl]     Caused tachycardia   Codeine Hives and Other (See Comments)    Hallucination,    Penicillins Hives    Has patient had a PCN reaction causing immediate rash, facial/tongue/throat swelling, SOB or lightheadedness with hypotension:NO Has patient had a PCN reaction causing severe rash involving mucus membranes or skin necrosis:UNSURE Has patient had a PCN reaction that required hospitalization:No Has patient had a PCN reaction occurring within the last 10 years:No If all of the above answers are "NO", then may proceed with Cephalosporin use.     Family History  Problem Relation Age of Onset   Heart failure Mother    Hepatitis Mother        C   Dementia Mother    COPD Mother        emphysema   Cancer Father        lung with mets to brain   Diabetes Sister    Hepatitis Brother    Cancer Sister  leukemia   Colon cancer Neg Hx     Prior to Admission medications   Medication Sig Start Date End Date Taking? Authorizing Provider  acetaminophen (TYLENOL) 500 MG tablet Take 1,000 mg by mouth every 6 (six) hours as needed for mild pain.   Yes [provider]  albuterol (PROAIR HFA) 108 (90 Base) MCG/ACT inhaler Inhale 2 puffs into the lungs every 6 (six) hours as needed for wheezing. 12/06/18 04/30/21 Yes Tysinger, Camelia Eng, PA-C  albuterol (PROVENTIL) (2.5 MG/3ML) 0.083% nebulizer solution Take 3 mLs (2.5 mg total) by nebulization every 6 (six) hours as needed for wheezing or shortness of breath. 03/13/19  Yes Tysinger, Camelia Eng, PA-C  amLODipine (NORVASC) 5 MG tablet Take 5 mg by mouth 2 (two) times daily.   06/05/16  Yes [provider]  fluticasone (FLONASE) 50 MCG/ACT nasal spray Place 1 spray into both nostrils daily as needed (for nasal congestion). Reported on 07/16/2016   Yes [provider]  fluticasone furoate-vilanterol (BREO ELLIPTA) 200-25 MCG/INH AEPB Inhale 1 puff into the lungs daily. 10/03/19  Yes Tysinger, Camelia Eng, PA-C  lisinopril (ZESTRIL) 5 MG tablet Take 5 mg by mouth daily.   Yes [provider]  omeprazole (PRILOSEC) 20 MG capsule Take 20 mg by mouth 2 (two) times daily.     Yes [provider]  pseudoephedrine (SUDAFED) 30 MG tablet Take 30 mg by mouth every 8 (eight) hours as needed for congestion. Reported on 07/16/2016   Yes [provider]  HYDROcodone-acetaminophen (NORCO) 5-325 MG tablet Take 1 tablet by mouth every 6 (six) hours as needed. 11/30/19   Tysinger, Camelia Eng, PA-C  famotidine (PEPCID) 20 MG tablet Take 20 mg by mouth 2 (two) times daily.    03/18/12  [provider]    Physical Exam: Vitals:   12/01/19 1506 12/01/19 1509 12/01/19 1742  BP: (!) 95/50  (!) 99/55  Pulse: 74  88  Resp: 14  15  Temp: 98.2 F (36.8 C)    TempSrc: Oral    SpO2: 96%  94%  Weight:  50.6 kg   Height:  5\' 3"  (1.6 m)     Constitutional: NAD, calm, comfortable Vitals:   12/01/19 1506 12/01/19 1509 12/01/19 1742  BP: (!) 95/50  (!) 99/55  Pulse: 74  88  Resp: 14  15  Temp: 98.2 F (36.8 C)    TempSrc: Oral    SpO2: 96%  94%  Weight:  50.6 kg   Height:  5\' 3"  (1.6 m)   BMI is low for her height.  Muscle loss is evident. Eyes: PERRLA Ear nose throat: Dry mucous membranes.  No exudate Neck: Supple.  Flat veins.  No carotid bruits Skin: Turgor is poor.  Sclerosis of the hands is noted Chest: Non tender Lungs: Breath sounds are markedly diminished on the left side no wheezing or rales are noted on the other side. Heart: Regular rhythm without S3 or S4 no evident murmurs. Abdomen: Tense but nontender with active bowel sounds.   No hepatosplenomegaly noted. Mood: Depressed Neurologic: Grossly normal motor function some sensory loss in the lower extremities. Extremities: Sclerosis is notable.  L atrophy is evident, Peripheral pulses: Are diminished (Labs on Admission: I have personally reviewed following labs and imaging studies. White count is elevated hemoglobin is low at 9.9 MCV is normal.  Platelet count is elevated higher sodium is 123 potassium is 5.2 creatinine is 1.96 and GFR calculated to be 24 mL/min calcium was 8.2 liver  enzymes are normal albumin is low at 3 lipase is normal at 24  CBC: Recent Labs  Lab 11/30/19 1119 12/01/19 1518  WBC 16.6* 15.4*  NEUTROABS 14.2*  --   HGB 9.9* 9.8*  HCT 30.6* 30.8*  MCV 91 91.4  PLT 727* 728*   Basic Metabolic Panel: Recent Labs  Lab 11/30/19 1119 12/01/19 1518  NA 122* 123*  K 5.8* 5.2*  CL 86* 90*  CO2 19* 19*  GLUCOSE 100* 91  BUN 14 21  CREATININE 1.55* 1.96*  CALCIUM 8.2* 8.2*   GFR: Estimated Creatinine Clearance: 23.8 mL/min (A) (by C-G formula based on SCr of 1.96 mg/dL (H)). Liver Function Tests: Recent Labs  Lab 11/30/19 1119 12/01/19 1518  AST 18 20  ALT 13 15  ALKPHOS 93 72  BILITOT 0.4 1.1  PROT 6.5 6.8  ALBUMIN 3.2* 3.0*   Recent Labs  Lab 12/01/19 1518  LIPASE 24   No results for input(s): AMMONIA in the last 168 hours. Coagulation Profile: No results for input(s): INR, PROTIME in the last 168 hours. Cardiac Enzymes: No results for input(s): CKTOTAL, CKMB, CKMBINDEX, TROPONINI in the last 168 hours. BNP (last 3 results) No results for input(s): PROBNP in the last 8760 hours. HbA1C: No results for input(s): HGBA1C in the last 72 hours. CBG: No results for input(s): GLUCAP in the last 168 hours. Lipid Profile: No results for input(s): CHOL, HDL, LDLCALC, TRIG, CHOLHDL, LDLDIRECT in the last 72 hours. Thyroid Function Tests: No results for input(s): TSH, T4TOTAL, FREET4, T3FREE, THYROIDAB in the last 72  hours. Anemia Panel: No results for input(s): VITAMINB12, FOLATE, FERRITIN, TIBC, IRON, RETICCTPCT in the last 72 hours. Urine analysis:    Component Value Date/Time   COLORURINE YELLOW 07/11/2016 0211   APPEARANCEUR CLEAR 07/11/2016 0211   LABSPEC 1.010 11/30/2019 1203   PHURINE 5.5 07/11/2016 0211   GLUCOSEU NEGATIVE 07/11/2016 0211   GLUCOSEU Negative 02/03/2010 0947   HGBUR NEGATIVE 07/11/2016 0211   BILIRUBINUR negative 11/30/2019 1203   BILIRUBINUR n 07/02/2016 1026   KETONESUR negative 11/30/2019 1203   KETONESUR NEGATIVE 07/11/2016 0211   PROTEINUR negative 11/30/2019 1203   PROTEINUR NEGATIVE 07/11/2016 0211   UROBILINOGEN negative 07/02/2016 1026   UROBILINOGEN 0.2 02/08/2010 0744   NITRITE Negative 11/30/2019 1203   NITRITE NEGATIVE 07/11/2016 0211   LEUKOCYTESUR Negative 11/30/2019 1203    Radiological Exams on Admission: Ct Abdomen Pelvis Wo Contrast  Result Date: 12/01/2019 CLINICAL DATA:  Abdominal pain and diarrhea. Pleural effusion. History of scleroderma EXAM: CT CHEST, ABDOMEN AND PELVIS WITHOUT CONTRAST TECHNIQUE: Multidetector CT imaging of the chest, abdomen and pelvis was performed following the standard protocol without IV contrast. COMPARISON:  CT chest 06/15/2019 and CT AP 06/24/2016. FINDINGS: CT CHEST FINDINGS Cardiovascular: The heart size is within normal limits. No pericardial effusion identified. Aortic atherosclerosis. Mediastinum/Nodes: Normal appearance of the thyroid gland. The trachea appears patent and is midline. Thin walled dilated esophagus with air-fluid level is again noted compatible with the clinical history of scleroderma. No enlarged axillary, supraclavicular, or mediastinal adenopathy. Lungs/Pleura: There is a large left pleural effusion, new compared with previous exam. Associated near complete atelectasis of the left lower lobe is identified. Loculated fluid extending along the oblique fissure is noted. Within the right lung there are  multifocal areas of interlobular septal thickening and ground-glass attenuation involving the right lower lobe, right middle lobe, and peripheral right upper lobe. Musculoskeletal: No chest wall mass or suspicious bone lesions identified. CT ABDOMEN PELVIS FINDINGS Hepatobiliary:  Hepatic steatosis. No focal liver abnormality is seen. No gallstones, gallbladder wall thickening, or biliary dilatation. Pancreas: Unremarkable. No pancreatic ductal dilatation or surrounding inflammatory changes. Spleen: Normal in size without focal abnormality. Adrenals/Urinary Tract: Normal adrenal glands. Cyst within upper pole of the left kidney is incompletely characterized without IV contrast measuring 1.1 cm, 65/4. No mass or hydronephrosis identified bilaterally. The urinary bladder is normal. Stomach/Bowel: Stomach is normal. Small bowel loops are unremarkable. Mild diffuse wall thickening involving the colon is noted particularly at the level of the hepatic flexure. At the a patent flexure there is mild pericolonic soft tissue stranding noted as well. No pneumatosis. Vascular/Lymphatic: Aortic atherosclerosis. No abdominopelvic adenopathy. Reproductive: Uterus and bilateral adnexa are unremarkable. Other: No free fluid or fluid collections. Musculoskeletal: No acute or significant osseous findings. IMPRESSION: 1. Large left pleural effusion is identified with near complete atelectasis of the left lower lobe. Etiology is indeterminate. New when compared with 06/15/2019. Consider further evaluation with diagnostic/therapeutic thoracentesis. 2. There are multifocal areas of interlobular septal thickening and ground-glass attenuation within the right lung which may reflect an inflammatory or infectious process. 3. Mild diffuse wall thickening and pericolonic soft tissue stranding involving the colon particularly at the level of the hepatic flexure. Findings compatible with colitis. 4. Aortic atherosclerosis. 5. Thin walled dilated  esophagus with air-fluid level compatible with the clinical history of scleroderma. 6. Hepatic steatosis. Aortic Atherosclerosis (ICD10-I70.0). Electronically Signed   By: Signa Kell [name]   On: 12/01/2019 19:26   Ct Chest Wo Contrast  Result Date: 12/01/2019 CLINICAL DATA:  Abdominal pain and diarrhea. Pleural effusion. History of scleroderma EXAM: CT CHEST, ABDOMEN AND PELVIS WITHOUT CONTRAST TECHNIQUE: Multidetector CT imaging of the chest, abdomen and pelvis was performed following the standard protocol without IV contrast. COMPARISON:  CT chest 06/15/2019 and CT AP 06/24/2016. FINDINGS: CT CHEST FINDINGS Cardiovascular: The heart size is within normal limits. No pericardial effusion identified. Aortic atherosclerosis. Mediastinum/Nodes: Normal appearance of the thyroid gland. The trachea appears patent and is midline. Thin walled dilated esophagus with air-fluid level is again noted compatible with the clinical history of scleroderma. No enlarged axillary, supraclavicular, or mediastinal adenopathy. Lungs/Pleura: There is a large left pleural effusion, new compared with previous exam. Associated near complete atelectasis of the left lower lobe is identified. Loculated fluid extending along the oblique fissure is noted. Within the right lung there are multifocal areas of interlobular septal thickening and ground-glass attenuation involving the right lower lobe, right middle lobe, and peripheral right upper lobe. Musculoskeletal: No chest wall mass or suspicious bone lesions identified. CT ABDOMEN PELVIS FINDINGS Hepatobiliary: Hepatic steatosis. No focal liver abnormality is seen. No gallstones, gallbladder wall thickening, or biliary dilatation. Pancreas: Unremarkable. No pancreatic ductal dilatation or surrounding inflammatory changes. Spleen: Normal in size without focal abnormality. Adrenals/Urinary Tract: Normal adrenal glands. Cyst within upper pole of the left kidney is incompletely characterized  without IV contrast measuring 1.1 cm, 65/4. No mass or hydronephrosis identified bilaterally. The urinary bladder is normal. Stomach/Bowel: Stomach is normal. Small bowel loops are unremarkable. Mild diffuse wall thickening involving the colon is noted particularly at the level of the hepatic flexure. At the a patent flexure there is mild pericolonic soft tissue stranding noted as well. No pneumatosis. Vascular/Lymphatic: Aortic atherosclerosis. No abdominopelvic adenopathy. Reproductive: Uterus and bilateral adnexa are unremarkable. Other: No free fluid or fluid collections. Musculoskeletal: No acute or significant osseous findings. IMPRESSION: 1. Large left pleural effusion is identified with near complete atelectasis of the left lower  lobe. Etiology is indeterminate. New when compared with 06/15/2019. Consider further evaluation with diagnostic/therapeutic thoracentesis. 2. There are multifocal areas of interlobular septal thickening and ground-glass attenuation within the right lung which may reflect an inflammatory or infectious process. 3. Mild diffuse wall thickening and pericolonic soft tissue stranding involving the colon particularly at the level of the hepatic flexure. Findings compatible with colitis. 4. Aortic atherosclerosis. 5. Thin walled dilated esophagus with air-fluid level compatible with the clinical history of scleroderma. 6. Hepatic steatosis. Aortic Atherosclerosis (ICD10-I70.0). Electronically Signed   By: Signa Kellaylor  Stroud [name]   On: 12/01/2019 19:26   Large pleural effusion on the left and other stigmata of colitis along with scleroderma noted  Assessment/Plan #1 acute on chronic colitis.  We will treat as infectious however further evaluation for inflammatory bowel disease will need to be conducted./We will cover with Flagyl/at this point C. difficile could not be tested because the stools are formed.  However she will be kept on contact isolation #2 large left pleural effusion  etiology will need to be determined.  Therapeutic/diagnostic thoracentesis will need to be conducted #3 history of scleroderma is noted. / We will continue her on her proton pump inhibitors #4 hypotension in a patient who is mostly hypertensive./IV hydration #5 cute kidney injury likely superimposed on stage IV kidney disease. #6. acute kidney injury is likely due to dehydration. #7 hyponatremia which I feel is chronic and I suspect that saline infusion would help.   DVT prophylaxis: Contraindicated due to anemia Code Status: Full Family Communication: Discussed with daughter at the bedside Disposition Plan: Home with home care Consults called:  Admission status: Full   Nancy MarusAhmad T Ayeden Gladman MD [facility] Pager [phone]  If 7PM-7AM, please contact night-coverage [email or web address] Password [email or web address]  12/01/2019, 10:13 PM  Ecg: TO REVIEW

## 2019-12-01 NOTE — ED Notes (Signed)
Lab states that they cannot run the c diff test because the stool is too formed. PA made aware.

## 2019-12-01 NOTE — ED Triage Notes (Signed)
Patient c/o abdominal pain and diarrhea x 2 weeks. Patient states she went to [facility] today and was told to come to the ED for IV fluids. Patient states she has vomited x 2 in 2 weeks also.

## 2019-12-01 NOTE — Progress Notes (Signed)
ASSESSMENT / PLAN:   63 yo female with two week history of intermittent diarrhea after eating suspicious looking ice cream a couple of weeks ago. Rule out infectious enteritis. Additionally, she had antibiotics a month ago for a UTI. Rule out C-diff colitis. Though diarrhea is only intermittent patient complaining of significant weakness / fatigue.  Based on yesterday's labs she has significant electrolyte disturbances, AKI on CKD and her WBC is 16.  She is hemodynamically stable -- She is unable to submit a stool sample right now.  I think the best thing for her at this point is to head over to the ED for further evaluation of probable volume depletion, electrolyte abnormalities, infectious workup.    HPI:     Chief Complaint:   Weakness, diarrhea  [name] is a 63 yo female with PMH significant for scleroderma, HTN, CKD, GERD, ischemic colitis ( 2017). She was admitted in 2017 with 3 weeks of diarrhea and abdominal pain. Colonoscopy with biopsies July 2017 c/w segmental colitis, probably ischemic.  Random biopsies for diarrhea were negative. She recovered and hasn't had any further diarrhea until a couple of weeks ago.   On Friday 11/16/19 [name] ate an ice cream sandwich.  The ice cream did not look quite right as it was dark in certain places but she ate it anyway.  Thrity minutes later she felt nauseated and then 12 hours later woke up in the middle of the night with watery diarrhea.  Watery diarrhea lasted about an hour then the stool turned to be just loose in nature.  She has been having frequent episodes of diarrhea since. No fevers, no nausea / vomiting. She took Doxycycline a month ago for a UTI. She feels very weak with some dizziness. PCP ordered stool studies, she hasn't had a BM yet to submit. She is able to drink fluids at home but just feels so weak   Data Reviewed:  Labs yesterday remarkable for sodium of 122, potassium 5.8, creatinine 1.55, WBC 16.6.    Past  Medical History:  Diagnosis Date  . Allergic rhinitis   . Asthma   . Atherosclerosis   . Chronic kidney disease (CKD), stage III (moderate)   . Esophageal stricture   . GERD (gastroesophageal reflux disease)   . History of anemia due to chronic kidney disease   . History of colitis 2017  . Hypertension   . Metabolic bone disease   . Raynaud disease   . Scleroderma [facility])      Past Surgical History:  Procedure Laterality Date  . CESAREAN SECTION    . COLONOSCOPY WITH PROPOFOL N/A 07/14/2016   Procedure: COLONOSCOPY WITH PROPOFOL;  Surgeon: Rachael Fee, MD;  Location: [facility];  Service: Endoscopy;  Laterality: N/A;  . HAND SURGERY     left; tendon repair  . TONSILLECTOMY     Family History  Problem Relation Age of Onset  . Heart failure Mother   . Hepatitis Mother        C  . Dementia Mother   . COPD Mother        emphysema  . Cancer Father        lung with mets to brain  . Diabetes Sister   . Hepatitis Brother   . Cancer Sister        leukemia  . Colon cancer Neg Hx    Social History   Tobacco  Use  . Smoking status: Never Smoker  . Smokeless tobacco: Never Used  Substance Use Topics  . Alcohol use: No  . Drug use: No   Current Outpatient Medications  Medication Sig Dispense Refill  . acetaminophen (TYLENOL) 500 MG tablet Take 1,000 mg by mouth every 6 (six) hours as needed for mild pain.    [name] albuterol (PROAIR HFA) 108 (90 Base) MCG/ACT inhaler Inhale 2 puffs into the lungs every 6 (six) hours as needed for wheezing. 1 Inhaler 1  . albuterol (PROVENTIL) (2.5 MG/3ML) 0.083% nebulizer solution Take 3 mLs (2.5 mg total) by nebulization every 6 (six) hours as needed for wheezing or shortness of breath. 75 mL 1  . amLODipine (NORVASC) 5 MG tablet Take 5 mg by mouth 2 (two) times daily.     . fluticasone (FLONASE) 50 MCG/ACT nasal spray Place 1 spray into both nostrils daily as needed (for nasal congestion). Reported on 07/16/2016    . fluticasone  furoate-vilanterol (BREO ELLIPTA) 200-25 MCG/INH AEPB Inhale 1 puff into the lungs daily. 1 each 0  . HYDROcodone-acetaminophen (NORCO) 5-325 MG tablet Take 1 tablet by mouth every 6 (six) hours as needed. 10 tablet 0  . omeprazole (PRILOSEC) 20 MG capsule Take 20 mg by mouth 2 (two) times daily.      . pseudoephedrine (SUDAFED) 30 MG tablet Take 30 mg by mouth every 8 (eight) hours as needed for congestion. Reported on 07/16/2016     No current facility-administered medications for this visit.    Allergies  Allergen Reactions  . Avelox [Moxifloxacin Hcl In Nacl]     Caused tachycardia  . Codeine Hives and Other (See Comments)    Hallucination,   . Penicillins Hives    Has patient had a PCN reaction causing immediate rash, facial/tongue/throat swelling, SOB or lightheadedness with hypotension:NO Has patient had a PCN reaction causing severe rash involving mucus membranes or skin necrosis:UNSURE Has patient had a PCN reaction that required hospitalization:No Has patient had a PCN reaction occurring within the last 10 years:No If all of the above answers are "NO", then may proceed with Cephalosporin use.      Review of Systems: Positive for sinus trouble, confusion, fatigue, itching . All other systems reviewed and negative except where noted in HPI.   Serum creatinine: 1.55 mg/dL (H) 11/30/19 1119 Estimated creatinine clearance: 30.1 mL/min (A)   Physical Exam:    Wt Readings from Last 3 Encounters:  12/01/19 111 lb 9.6 oz (50.6 kg)  11/30/19 113 lb 6.4 oz (51.4 kg)  10/27/19 112 lb 9.6 oz (51.1 kg)    BP 112/68   Pulse 73   Temp 98 F (36.7 C)   Ht 5\' 3"  (1.6 m)   Wt 111 lb 9.6 oz (50.6 kg)   BMI 19.77 kg/m  Constitutional:  Pleasant but ill appearing female in no acute distress. Psychiatric: Normal mood and affect. Behavior is normal. EENT: Pupils normal.  Conjunctivae are normal. No scleral icterus. Neck supple.  Cardiovascular: Normal rate, regular rhythm. No edema  Pulmonary/chest: Effort normal and breath sounds normal. No wheezing, rales or rhonchi. Abdominal: Soft, nondistended, mild lower abdominal tenderness. Bowel sounds active throughout. There are no masses palpable. No hepatomegaly. Neurological: Alert and oriented to person place and time. Skin: Skin is warm and dry. No rashes noted.  Tye Savoy, NP  12/01/2019, 1:46 PM  Cc: Carlena Hurl, PA-C

## 2019-12-01 NOTE — Progress Notes (Signed)
Case reviewed with nurse practitioner.  Plans for admission noted

## 2019-12-01 NOTE — ED Notes (Signed)
Attempted to collect urine sample, but sample was contaminated with feces. Will try again with pts next toileting attempt.

## 2019-12-01 NOTE — ED Notes (Signed)
Patient transported to CT 

## 2019-12-01 NOTE — Patient Instructions (Addendum)
If you are age 62 or older, your body mass index should be between 23-30. Your Body mass index is 19.77 kg/m. If this is out of the aforementioned range listed, please consider follow up with your Primary Care Provider.  If you are age 73 or younger, your body mass index should be between 19-25. Your Body mass index is 19.77 kg/m. If this is out of the aformentioned range listed, please consider follow up with your Primary Care Provider.   Your renal function is a little worse.  Your sodium is too low.  Your white count is elevated.  Since you feel so weak, please go the the Emergency Department at [facility].  Thank you for choosing me and [facility].   Tye Savoy, NP

## 2019-12-02 ENCOUNTER — Inpatient Hospital Stay (HOSPITAL_COMMUNITY): Payer: Medicare PPO

## 2019-12-02 DIAGNOSIS — K529 Noninfective gastroenteritis and colitis, unspecified: Secondary | ICD-10-CM

## 2019-12-02 LAB — SARS CORONAVIRUS 2 (TAT 6-24 HRS): SARS Coronavirus 2: NEGATIVE

## 2019-12-02 LAB — URINALYSIS, ROUTINE W REFLEX MICROSCOPIC
Bacteria, UA: NONE SEEN
Bilirubin Urine: NEGATIVE
Glucose, UA: NEGATIVE mg/dL
Ketones, ur: 20 mg/dL — AB
Leukocytes,Ua: NEGATIVE
Nitrite: NEGATIVE
Protein, ur: NEGATIVE mg/dL
Specific Gravity, Urine: 1.006 (ref 1.005–1.030)
pH: 5 (ref 5.0–8.0)

## 2019-12-02 LAB — BODY FLUID CELL COUNT WITH DIFFERENTIAL
Eos, Fluid: 5 %
Lymphs, Fluid: 17 %
Monocyte-Macrophage-Serous Fluid: 2 % — ABNORMAL LOW (ref 50–90)
Neutrophil Count, Fluid: 76 % — ABNORMAL HIGH (ref 0–25)
Total Nucleated Cell Count, Fluid: 987 cu mm (ref 0–1000)

## 2019-12-02 LAB — LACTATE DEHYDROGENASE, PLEURAL OR PERITONEAL FLUID: LD, Fluid: 106 U/L — ABNORMAL HIGH (ref 3–23)

## 2019-12-02 LAB — CBC WITH DIFFERENTIAL/PLATELET
Abs Immature Granulocytes: 0.11 10*3/uL — ABNORMAL HIGH (ref 0.00–0.07)
Basophils Absolute: 0.1 10*3/uL (ref 0.0–0.1)
Basophils Relative: 0 %
Eosinophils Absolute: 0 10*3/uL (ref 0.0–0.5)
Eosinophils Relative: 0 %
HCT: 29.8 % — ABNORMAL LOW (ref 36.0–46.0)
Hemoglobin: 9.1 g/dL — ABNORMAL LOW (ref 12.0–15.0)
Immature Granulocytes: 1 %
Lymphocytes Relative: 3 %
Lymphs Abs: 0.7 10*3/uL (ref 0.7–4.0)
MCH: 28.8 pg (ref 26.0–34.0)
MCHC: 30.5 g/dL (ref 30.0–36.0)
MCV: 94.3 fL (ref 80.0–100.0)
Monocytes Absolute: 1.7 10*3/uL — ABNORMAL HIGH (ref 0.1–1.0)
Monocytes Relative: 9 %
Neutro Abs: 17 10*3/uL — ABNORMAL HIGH (ref 1.7–7.7)
Neutrophils Relative %: 87 %
Platelets: 694 10*3/uL — ABNORMAL HIGH (ref 150–400)
RBC: 3.16 MIL/uL — ABNORMAL LOW (ref 3.87–5.11)
RDW: 12.9 % (ref 11.5–15.5)
WBC: 19.6 10*3/uL — ABNORMAL HIGH (ref 4.0–10.5)
nRBC: 0 % (ref 0.0–0.2)

## 2019-12-02 LAB — PROTEIN, PLEURAL OR PERITONEAL FLUID: Total protein, fluid: 4.1 g/dL

## 2019-12-02 LAB — COMPREHENSIVE METABOLIC PANEL
ALT: 12 U/L (ref 0–44)
AST: 16 U/L (ref 15–41)
Albumin: 2.4 g/dL — ABNORMAL LOW (ref 3.5–5.0)
Alkaline Phosphatase: 64 U/L (ref 38–126)
Anion gap: 17 — ABNORMAL HIGH (ref 5–15)
BUN: 18 mg/dL (ref 8–23)
CO2: 16 mmol/L — ABNORMAL LOW (ref 22–32)
Calcium: 7.9 mg/dL — ABNORMAL LOW (ref 8.9–10.3)
Chloride: 97 mmol/L — ABNORMAL LOW (ref 98–111)
Creatinine, Ser: 1.57 mg/dL — ABNORMAL HIGH (ref 0.44–1.00)
GFR calc Af Amer: 41 mL/min — ABNORMAL LOW (ref >60)
GFR calc non Af Amer: 35 mL/min — ABNORMAL LOW (ref >60)
Glucose, Bld: 59 mg/dL — ABNORMAL LOW (ref 70–99)
Potassium: 5.3 mmol/L — ABNORMAL HIGH (ref 3.5–5.1)
Sodium: 130 mmol/L — ABNORMAL LOW (ref 135–145)
Total Bilirubin: 0.8 mg/dL (ref 0.3–1.2)
Total Protein: 6 g/dL — ABNORMAL LOW (ref 6.5–8.1)

## 2019-12-02 LAB — ALBUMIN, PLEURAL OR PERITONEAL FLUID: Albumin, Fluid: 2.1 g/dL

## 2019-12-02 LAB — GLUCOSE, PLEURAL OR PERITONEAL FLUID: Glucose, Fluid: 72 mg/dL

## 2019-12-02 LAB — HIV ANTIBODY (ROUTINE TESTING W REFLEX): HIV Screen 4th Generation wRfx: NONREACTIVE

## 2019-12-02 MED ORDER — SODIUM CHLORIDE 0.9 % IV BOLUS
500.0000 mL | Freq: Once | INTRAVENOUS | Status: AC
  Administered 2019-12-02: 500 mL via INTRAVENOUS

## 2019-12-02 MED ORDER — METRONIDAZOLE IN NACL 5-0.79 MG/ML-% IV SOLN
500.0000 mg | Freq: Three times a day (TID) | INTRAVENOUS | Status: DC
  Administered 2019-12-02: 500 mg via INTRAVENOUS
  Filled 2019-12-02: qty 100

## 2019-12-02 MED ORDER — SODIUM CHLORIDE 0.9 % IV SOLN
INTRAVENOUS | Status: DC
  Administered 2019-12-02 (×2): via INTRAVENOUS

## 2019-12-02 MED ORDER — LIDOCAINE HCL 1 % IJ SOLN
INTRAMUSCULAR | Status: AC
  Filled 2019-12-02: qty 10

## 2019-12-02 MED ORDER — POTASSIUM CHLORIDE IN NACL 20-0.9 MEQ/L-% IV SOLN
INTRAVENOUS | Status: DC
  Administered 2019-12-02: 08:00:00 via INTRAVENOUS
  Filled 2019-12-02: qty 1000

## 2019-12-02 NOTE — Progress Notes (Signed)
Received report from Clarise Cruz, pt arrived from 3rd floor, ambulatory and stable. VS noted, denies pain. No nausea noted. Pt place on tele and verified. Orders reviewed will cont plan of care. SRP, RN

## 2019-12-02 NOTE — Progress Notes (Signed)
Charge nurse paged MD at [place] and pt nurse paged again at 0900 about active tele order.  Dr. Pietro Cassis responded and stated that he will address tele order when he rounds on pt.

## 2019-12-02 NOTE — Progress Notes (Signed)
PROGRESS NOTE  [name]  DOB: 10-Feb-1956  PCP: Genia Del EUM:[id number]  DOA: 12/01/2019  LOS: 1 day   Chief Complaint  Patient presents with  . Abdominal Pain  . Diarrhea   Brief narrative: [name] is a 63 y.o. female with medical history significant for scleroderma on remission, esophageal stricture, hypertension, chronic kidney disease stage III, asthma and anemia of chronic disease. Shepresented to the ED on 12/4 with complaint of diarrhea, weakness.   2 weeks ago, patient started having profuse diarrhea with nausea, vomiting, poor appetite and it lasted for 7 to 10 days.  She was subsequently started on Imodium by her primary care provider and it slowed down and has almost completely resolved now.  However, her weakness did not improve.  She has very little energy to move around and get short of breath on minimal exertion.  Hence presented to the ED on 12/4.   In the ED, patient was not febrile, blood pressure chronically low in 90s. Work-up showed sodium level low at 123, potassium elevated to 5.2, creatinine elevated to 1.96, albumin 3, lipase 24, WBC count 15.4, hemoglobin 9.8, platelets 728. Urinalysis with clear urine.  CT scan of chest, abdomen and pelvis showed: 1. Large left pleural effusion is identified with near complete atelectasis of the left lower lobe.  2. There are multifocal areas of interlobular septal thickening and ground-glass attenuation within the right lung which may reflect an inflammatory or infectious process. 3. Mild diffuse wall thickening and pericolonic soft tissue stranding involving the colon particularly at the level of the hepatic flexure. Findings compatible with colitis. 4. Aortic atherosclerosis. 5. Thin walled dilated esophagus with air-fluid level compatible with the clinical history of scleroderma. 6. Hepatic steatosis.  Patient was admitted to hospitalist medicine service for further evaluation and management.   Subjective: Patient was seen and examined this morning.  Pleasant middle-aged Caucasian female.  Looks older for age.  On oxygen supplementation.  Diarrhea improved. Daughter at bedside. Not on any medicine for scleroderma at this time.  Assessment/Plan: Patient most likely had viral gastroenteritis which resolved on its own but also caused dehydration, weakness, acute kidney injury and presentation to the ED.  Acute viral gastroenteritis  -Mild diffuse wall thickening and pericolonic soft tissue stranding involving the colon particularly at the level of the hepatic flexure. Findings compatible with colitis. -Clinical symptoms resolved. -No fever, WBC count normal.  -Observe off antibiotics -Patient denies having any chronic diarrhea.  Generalized weakness -Multifactorial : Dehydration, dyselectrolytemia, large pleural effusion. -Encourage appetite, hydration and ambulation.  Hypotension -Patient states that her blood pressure is always low in 90s.  For some reason, she is still taking amlodipine 5 mg twice daily and lisinopril at home.  Both of them have been on hold for now.  I would not resume them unless blood pressure is 140/90 or above.  Acute kidney injury/hyperkalemia -Likely secondary to poor oral intake, fluid loss. -On admission, creatinine was elevated to 1.96 and potassium was elevated to 5.2. -Expect improvement with IV hydration.  Repeat labs tomorrow.  Large left pleural effusion  -Unclear etiology.  I wonder if it is related to scleroderma itself.   -Underwent thoracentesis today 1.5 L of yellow fluid removed.  More fluid could not be removed because of hypotension.  Fluid analysis showed glucose 72, LDH 106, protein 4.1, cytology sent. cultures awaited. -Patient may benefit from repeat paracentesis on Monday/Tuesday.  History of scleroderma -Follows up with rheumatologist Dr. Deanne Coffer.  Not on any therapy for scleroderma at this time.  Esophageal stricture -Related  to scleroderma itself. -Continue PPI.  Mobility: Encourage ambulation Diet: Soft diet Fluid: Normal saline 125 mils per hour DVT prophylaxis:  SCDs Code Status:  Full code Family Communication:  Daughter at bedside Expected Discharge:  Hopefully home in next 2 to 3 days.  Consultants:  IR  Procedures:    Antimicrobials: Anti-infectives (From admission, onward)   Start     Dose/Rate Route Frequency Ordered Stop   12/02/19 0800  metroNIDAZOLE (FLAGYL) IVPB 500 mg  Status:  Discontinued     500 mg 100 mL/hr over 60 Minutes Intravenous Every 8 hours 12/02/19 0458 12/02/19 1047        Code Status: Full Code   Diet Order            DIET SOFT Room service appropriate? Yes; Fluid consistency: Thin  Diet effective now              Infusions:  . sodium chloride 125 mL/hr at 12/02/19 1301    Scheduled Meds: . lidocaine      . sodium chloride flush  3 mL Intravenous Once    PRN meds:    Objective: Vitals:   12/02/19 1223 12/02/19 1305  BP: (!) 80/46 (!) 91/51  Pulse:  86  Resp:  20  Temp:    SpO2:      Intake/Output Summary (Last 24 hours) at 12/02/2019 1548 Last data filed at 12/02/2019 1500 Gross per 24 hour  Intake 762.78 ml  Output -  Net 762.78 ml   Filed Weights   12/01/19 1509  Weight: 50.6 kg   Weight change:  Body mass index is 19.77 kg/m.   Physical Exam: General exam: Appears calm and comfortable.  Skin: No rashes, lesions or ulcers. HEENT: Atraumatic, normocephalic, supple neck, no obvious bleeding Lungs: Diminished air entry on the left side with a pleural effusion, CVS: Regular rate and rhythm, no murmur GI/Abd soft, nontender, nondistended, bowel sound present CNS: Alert, awake, oriented x3 Psychiatry: Mood appropriate Extremities: No pedal edema, no calf tenderness  Data Review: I have personally reviewed the laboratory data and studies available.  Recent Labs  Lab 11/30/19 1119 12/01/19 1518 12/02/19 0758  WBC 16.6*  15.4* 19.6*  NEUTROABS 14.2*  --  17.0*  HGB 9.9* 9.8* 9.1*  HCT 30.6* 30.8* 29.8*  MCV 91 91.4 94.3  PLT 727* 728* 694*   Recent Labs  Lab 11/30/19 1119 12/01/19 1518 12/02/19 0758  NA 122* 123* 130*  K 5.8* 5.2* 5.3*  CL 86* 90* 97*  CO2 19* 19* 16*  GLUCOSE 100* 91 59*  BUN 14 21 18   CREATININE 1.55* 1.96* 1.57*  CALCIUM 8.2* 8.2* 7.9*    Terrilee Croak, MD  [facility] 12/02/2019

## 2019-12-02 NOTE — ED Notes (Signed)
ED TO INPATIENT HANDOFF REPORT  Name/Age/Gender [name] 63 y.o. female  Code Status    Code Status Orders  (From admission, onward)         Start     Ordered   12/02/19 0459  Full code  Continuous     12/02/19 0458        Code Status History    Date Active Date Inactive Code Status Order ID Comments User Context   07/11/2016 0522 07/14/2016 1651 Full Code [id number]  Hillary Bow, DO ED   06/24/2016 0415 06/25/2016 1459 Full Code [id number]  Lorretta Harp, MD ED   Advance Care Planning Activity      Home/SNF/Other Home  Chief Complaint Needs fluids  Level of Care/Admitting Diagnosis ED Disposition    ED Disposition Condition Comment   Admit  Hospital Area: [facility] [[id number]]  Level of Care: Med-Surg [16]  Covid Evaluation: Asymptomatic Screening Protocol (No Symptoms)  Diagnosis: Colitis [540086]  Admitting Physician: Nancy Marus [[id number]]  Attending Physician: HAQ, AHMAD T [id number]  Estimated length of stay: 3 - 4 days  Certification:: I certify this patient will need inpatient services for at least 2 midnights  PT Class (Do Not Modify): Inpatient [101]  PT Acc Code (Do Not Modify): Private [1]       Medical History Past Medical History:  Diagnosis Date  . Allergic rhinitis   . Asthma   . Atherosclerosis   . Chronic kidney disease (CKD), stage III (moderate)   . Esophageal stricture   . GERD (gastroesophageal reflux disease)   . History of anemia due to chronic kidney disease   . History of colitis 2017  . Hypertension   . Metabolic bone disease   . Raynaud disease   . Scleroderma (HCC)     Allergies Allergies  Allergen Reactions  . Avelox [Moxifloxacin Hcl In Nacl]     Caused tachycardia  . Codeine Hives and Other (See Comments)    Hallucination,   . Penicillins Hives    Has patient had a PCN reaction causing immediate rash, facial/tongue/throat swelling, SOB or lightheadedness with hypotension:NO Has patient  had a PCN reaction causing severe rash involving mucus membranes or skin necrosis:UNSURE Has patient had a PCN reaction that required hospitalization:No Has patient had a PCN reaction occurring within the last 10 years:No If all of the above answers are "NO", then may proceed with Cephalosporin use.     IV Location/Drains/Wounds Patient Lines/Drains/Airways Status   Active Line/Drains/Airways    Name:   Placement date:   Placement time:   Site:   Days:   Peripheral IV 12/01/19 Left Antecubital   12/01/19    1803    Antecubital   1          Labs/Imaging Results for orders placed or performed during the hospital encounter of 12/01/19 (from the past 48 hour(s))  Lipase, blood     Status: None   Collection Time: 12/01/19  3:18 PM  Result Value Ref Range   Lipase 24 11 - 51 U/L    Comment: Performed at [facility], [address], [place], [place] [address]  Comprehensive metabolic panel     Status: Abnormal   Collection Time: 12/01/19  3:18 PM  Result Value Ref Range   Sodium 123 (L) 135 - 145 mmol/L   Potassium 5.2 (H) 3.5 - 5.1 mmol/L   Chloride 90 (L) 98 - 111 mmol/L   CO2 19 (L) 22 -  32 mmol/L   Glucose, Bld 91 70 - 99 mg/dL   BUN 21 8 - 23 mg/dL   Creatinine, Ser 1.32 (H) 0.44 - 1.00 mg/dL   Calcium 8.2 (L) 8.9 - 10.3 mg/dL   Total Protein 6.8 6.5 - 8.1 g/dL   Albumin 3.0 (L) 3.5 - 5.0 g/dL   AST 20 15 - 41 U/L   ALT 15 0 - 44 U/L   Alkaline Phosphatase 72 38 - 126 U/L   Total Bilirubin 1.1 0.3 - 1.2 mg/dL   GFR calc non Af Amer 27 (L) >60 mL/min   GFR calc Af Amer 31 (L) >60 mL/min   Anion gap 14 5 - 15    Comment: Performed at [facility], [address], [place], [place] [address]  CBC     Status: Abnormal   Collection Time: 12/01/19  3:18 PM  Result Value Ref Range   WBC 15.4 (H) 4.0 - 10.5 K/uL   RBC 3.37 (L) 3.87 - 5.11 MIL/uL   Hemoglobin 9.8 (L) 12.0 - 15.0 g/dL   HCT 27.2 (L) 53.6 - 64.4 %   MCV 91.4 80.0 - 100.0 fL    MCH 29.1 26.0 - 34.0 pg   MCHC 31.8 30.0 - 36.0 g/dL   RDW 03.4 74.2 - 59.5 %   Platelets 728 (H) 150 - 400 K/uL   nRBC 0.0 0.0 - 0.2 %    Comment: Performed at [facility], [address], [place], [place] [address]  SARS CORONAVIRUS 2 (TAT 6-24 HRS) Nasopharyngeal Nasopharyngeal Swab     Status: None   Collection Time: 12/01/19  6:15 PM   Specimen: Nasopharyngeal Swab  Result Value Ref Range   SARS Coronavirus 2 NEGATIVE NEGATIVE    Comment: (NOTE) SARS-CoV-2 target nucleic acids are NOT DETECTED. The SARS-CoV-2 RNA is generally detectable in upper and lower respiratory specimens during the acute phase of infection. Negative results do not preclude SARS-CoV-2 infection, do not rule out co-infections with other pathogens, and should not be used as the sole basis for treatment or other patient management decisions. Negative results must be combined with clinical observations, patient history, and epidemiological information. The expected result is Negative. Fact Sheet for Patients: HairSlick.no Fact Sheet for Healthcare Providers: quierodirigir.com This test is not yet approved or cleared by the [place] FDA and  has been authorized for detection and/or diagnosis of SARS-CoV-2 by FDA under an Emergency Use Authorization (EUA). This EUA will remain  in effect (meaning this test can be used) for the duration of the COVID-19 declaration under Section 56 4(b)(1) of the Act, 21 U.S.C. section 360bbb-3(b)(1), unless the authorization is terminated or revoked sooner. Performed at [facility], [address] [place], [place] [address]   Brain natriuretic peptide     Status: Abnormal   Collection Time: 12/01/19  6:41 PM  Result Value Ref Range   B Natriuretic Peptide 137.0 (H) 0.0 - 100.0 pg/mL    Comment: Performed at [facility], [address] [place], [place] [address]   Urinalysis, Routine w reflex microscopic     Status: Abnormal   Collection Time: 12/02/19  2:00 AM  Result Value Ref Range   Color, Urine YELLOW YELLOW   APPearance CLEAR CLEAR   Specific Gravity, Urine 1.006 1.005 - 1.030   pH 5.0 5.0 - 8.0   Glucose, UA NEGATIVE NEGATIVE mg/dL   Hgb urine dipstick SMALL (A) NEGATIVE   Bilirubin Urine NEGATIVE  NEGATIVE   Ketones, ur 20 (A) NEGATIVE mg/dL   Protein, ur NEGATIVE NEGATIVE mg/dL   Nitrite NEGATIVE NEGATIVE   Leukocytes,Ua NEGATIVE NEGATIVE   RBC / HPF 0-5 0 - 5 RBC/hpf   WBC, UA 0-5 0 - 5 WBC/hpf   Bacteria, UA NONE SEEN NONE SEEN   Squamous Epithelial / LPF 0-5 0 - 5    Comment: Performed at [facility], [place] [address], [place], [place] [address]   Ct Abdomen Pelvis Wo Contrast  Result Date: 12/01/2019 CLINICAL DATA:  Abdominal pain and diarrhea. Pleural effusion. History of scleroderma EXAM: CT CHEST, ABDOMEN AND PELVIS WITHOUT CONTRAST TECHNIQUE: Multidetector CT imaging of the chest, abdomen and pelvis was performed following the standard protocol without IV contrast. COMPARISON:  CT chest 06/15/2019 and CT AP 06/24/2016. FINDINGS: CT CHEST FINDINGS Cardiovascular: The heart size is within normal limits. No pericardial effusion identified. Aortic atherosclerosis. Mediastinum/Nodes: Normal appearance of the thyroid gland. The trachea appears patent and is midline. Thin walled dilated esophagus with air-fluid level is again noted compatible with the clinical history of scleroderma. No enlarged axillary, supraclavicular, or mediastinal adenopathy. Lungs/Pleura: There is a large left pleural effusion, new compared with previous exam. Associated near complete atelectasis of the left lower lobe is identified. Loculated fluid extending along the oblique fissure is noted. Within the right lung there are multifocal areas of interlobular septal thickening and ground-glass attenuation involving the right lower lobe, right middle  lobe, and peripheral right upper lobe. Musculoskeletal: No chest wall mass or suspicious bone lesions identified. CT ABDOMEN PELVIS FINDINGS Hepatobiliary: Hepatic steatosis. No focal liver abnormality is seen. No gallstones, gallbladder wall thickening, or biliary dilatation. Pancreas: Unremarkable. No pancreatic ductal dilatation or surrounding inflammatory changes. Spleen: Normal in size without focal abnormality. Adrenals/Urinary Tract: Normal adrenal glands. Cyst within upper pole of the left kidney is incompletely characterized without IV contrast measuring 1.1 cm, 65/4. No mass or hydronephrosis identified bilaterally. The urinary bladder is normal. Stomach/Bowel: Stomach is normal. Small bowel loops are unremarkable. Mild diffuse wall thickening involving the colon is noted particularly at the level of the hepatic flexure. At the a patent flexure there is mild pericolonic soft tissue stranding noted as well. No pneumatosis. Vascular/Lymphatic: Aortic atherosclerosis. No abdominopelvic adenopathy. Reproductive: Uterus and bilateral adnexa are unremarkable. Other: No free fluid or fluid collections. Musculoskeletal: No acute or significant osseous findings. IMPRESSION: 1. Large left pleural effusion is identified with near complete atelectasis of the left lower lobe. Etiology is indeterminate. New when compared with 06/15/2019. Consider further evaluation with diagnostic/therapeutic thoracentesis. 2. There are multifocal areas of interlobular septal thickening and ground-glass attenuation within the right lung which may reflect an inflammatory or infectious process. 3. Mild diffuse wall thickening and pericolonic soft tissue stranding involving the colon particularly at the level of the hepatic flexure. Findings compatible with colitis. 4. Aortic atherosclerosis. 5. Thin walled dilated esophagus with air-fluid level compatible with the clinical history of scleroderma. 6. Hepatic steatosis. Aortic  Atherosclerosis (ICD10-I70.0). Electronically Signed   By: Kerby Moors [name]   On: 12/01/2019 19:26   Ct Chest Wo Contrast  Result Date: 12/01/2019 CLINICAL DATA:  Abdominal pain and diarrhea. Pleural effusion. History of scleroderma EXAM: CT CHEST, ABDOMEN AND PELVIS WITHOUT CONTRAST TECHNIQUE: Multidetector CT imaging of the chest, abdomen and pelvis was performed following the standard protocol without IV contrast. COMPARISON:  CT chest 06/15/2019 and CT AP 06/24/2016. FINDINGS: CT CHEST FINDINGS Cardiovascular: The heart size is within normal limits. No pericardial  effusion identified. Aortic atherosclerosis. Mediastinum/Nodes: Normal appearance of the thyroid gland. The trachea appears patent and is midline. Thin walled dilated esophagus with air-fluid level is again noted compatible with the clinical history of scleroderma. No enlarged axillary, supraclavicular, or mediastinal adenopathy. Lungs/Pleura: There is a large left pleural effusion, new compared with previous exam. Associated near complete atelectasis of the left lower lobe is identified. Loculated fluid extending along the oblique fissure is noted. Within the right lung there are multifocal areas of interlobular septal thickening and ground-glass attenuation involving the right lower lobe, right middle lobe, and peripheral right upper lobe. Musculoskeletal: No chest wall mass or suspicious bone lesions identified. CT ABDOMEN PELVIS FINDINGS Hepatobiliary: Hepatic steatosis. No focal liver abnormality is seen. No gallstones, gallbladder wall thickening, or biliary dilatation. Pancreas: Unremarkable. No pancreatic ductal dilatation or surrounding inflammatory changes. Spleen: Normal in size without focal abnormality. Adrenals/Urinary Tract: Normal adrenal glands. Cyst within upper pole of the left kidney is incompletely characterized without IV contrast measuring 1.1 cm, 65/4. No mass or hydronephrosis identified bilaterally. The urinary bladder  is normal. Stomach/Bowel: Stomach is normal. Small bowel loops are unremarkable. Mild diffuse wall thickening involving the colon is noted particularly at the level of the hepatic flexure. At the a patent flexure there is mild pericolonic soft tissue stranding noted as well. No pneumatosis. Vascular/Lymphatic: Aortic atherosclerosis. No abdominopelvic adenopathy. Reproductive: Uterus and bilateral adnexa are unremarkable. Other: No free fluid or fluid collections. Musculoskeletal: No acute or significant osseous findings. IMPRESSION: 1. Large left pleural effusion is identified with near complete atelectasis of the left lower lobe. Etiology is indeterminate. New when compared with 06/15/2019. Consider further evaluation with diagnostic/therapeutic thoracentesis. 2. There are multifocal areas of interlobular septal thickening and ground-glass attenuation within the right lung which may reflect an inflammatory or infectious process. 3. Mild diffuse wall thickening and pericolonic soft tissue stranding involving the colon particularly at the level of the hepatic flexure. Findings compatible with colitis. 4. Aortic atherosclerosis. 5. Thin walled dilated esophagus with air-fluid level compatible with the clinical history of scleroderma. 6. Hepatic steatosis. Aortic Atherosclerosis (ICD10-I70.0). Electronically Signed   By: Signa Kellaylor  Stroud [name]   On: 12/01/2019 19:26    Pending Labs Unresulted Labs (From admission, onward)    Start     Ordered   12/02/19 0500  Comprehensive metabolic panel  Tomorrow morning,   R     12/02/19 0458   12/02/19 0500  CBC  Tomorrow morning,   R     12/02/19 0458   12/02/19 0459  HIV Antibody (routine testing w rflx)  (HIV Antibody (Routine testing w reflex) panel)  Once,   STAT     12/02/19 0458   12/02/19 0459  C difficile quick scan w PCR reflex  (C Difficile quick screen w PCR reflex panel)  Once, for 24 hours,   STAT     12/02/19 0458   12/01/19 1715  Gastrointestinal Panel  by PCR , Stool  (Gastrointestinal Panel by PCR, Stool                                                                                                                                                     *  Does Not include CLOSTRIDIUM DIFFICILE testing.**If CDIFF testing is needed, select the C Difficile Quick Screen w PCR reflex order below)  Once,   STAT     12/01/19 1714          Vitals/Pain Today's Vitals   12/01/19 2300 12/02/19 0207 12/02/19 0208 12/02/19 0500  BP: (!) 94/53 (!) 98/52  (!) 91/50  Pulse: 84 91  84  Resp: 16 16  (!) 27  Temp:      TempSrc:      SpO2: 94% 96%  94%  Weight:      Height:      PainSc:   1      Isolation Precautions Droplet and Enteric precautions (UV disinfection)  Medications Medications  sodium chloride flush (NS) 0.9 % injection 3 mL (has no administration in time range)  metroNIDAZOLE (FLAGYL) IVPB 500 mg (has no administration in time range)  0.9 % NaCl with KCl 20 mEq/ L  infusion (has no administration in time range)  0.9 %  sodium chloride infusion ( Intravenous New Bag/Given (Non-Interop) 12/01/19 2235)  sodium chloride 0.9 % bolus 500 mL (500 mLs Intravenous New Bag/Given 12/02/19 0457)    Mobility walks

## 2019-12-02 NOTE — Progress Notes (Signed)
Report received from [place]

## 2019-12-02 NOTE — ED Notes (Signed)
Pt placed on 2L O2 due to O2 sats dropping into 80's. O2 came up to 96% on 2L

## 2019-12-02 NOTE — Procedures (Addendum)
Ultrasound-guided diagnostic and therapeutic left thoracentesis performed yielding 1.5 liters of slightly hazy, yellow fluid. No immediate complications. Follow-up chest x-ray pending.The fluid was sent to the lab for preordered studies. EBL none. Due to pt's hypotension and this being pt's initial thoracentesis along with acute on chronic kidney disease only the above amount of fluid was removed today.

## 2019-12-02 NOTE — Plan of Care (Signed)
POC initiated 

## 2019-12-02 NOTE — Progress Notes (Signed)
Pt received to room 1322 via stretcher and transferred to bed. Education on use of call bell for needs/safety initiated. Will report to Day RN

## 2019-12-03 ENCOUNTER — Inpatient Hospital Stay (HOSPITAL_COMMUNITY): Payer: Medicare PPO

## 2019-12-03 LAB — COMPREHENSIVE METABOLIC PANEL
ALT: 11 U/L (ref 0–44)
AST: 16 U/L (ref 15–41)
Albumin: 2.1 g/dL — ABNORMAL LOW (ref 3.5–5.0)
Alkaline Phosphatase: 62 U/L (ref 38–126)
Anion gap: 11 (ref 5–15)
BUN: 16 mg/dL (ref 8–23)
CO2: 20 mmol/L — ABNORMAL LOW (ref 22–32)
Calcium: 7.9 mg/dL — ABNORMAL LOW (ref 8.9–10.3)
Chloride: 100 mmol/L (ref 98–111)
Creatinine, Ser: 1.55 mg/dL — ABNORMAL HIGH (ref 0.44–1.00)
GFR calc Af Amer: 41 mL/min — ABNORMAL LOW (ref >60)
GFR calc non Af Amer: 36 mL/min — ABNORMAL LOW (ref >60)
Glucose, Bld: 94 mg/dL (ref 70–99)
Potassium: 5.5 mmol/L — ABNORMAL HIGH (ref 3.5–5.1)
Sodium: 131 mmol/L — ABNORMAL LOW (ref 135–145)
Total Bilirubin: 1 mg/dL (ref 0.3–1.2)
Total Protein: 5.4 g/dL — ABNORMAL LOW (ref 6.5–8.1)

## 2019-12-03 LAB — CBC WITH DIFFERENTIAL/PLATELET
Abs Immature Granulocytes: 0.09 10*3/uL — ABNORMAL HIGH (ref 0.00–0.07)
Basophils Absolute: 0.1 10*3/uL (ref 0.0–0.1)
Basophils Relative: 0 %
Eosinophils Absolute: 0.1 10*3/uL (ref 0.0–0.5)
Eosinophils Relative: 1 %
HCT: 28.3 % — ABNORMAL LOW (ref 36.0–46.0)
Hemoglobin: 9 g/dL — ABNORMAL LOW (ref 12.0–15.0)
Immature Granulocytes: 1 %
Lymphocytes Relative: 6 %
Lymphs Abs: 1 10*3/uL (ref 0.7–4.0)
MCH: 29.1 pg (ref 26.0–34.0)
MCHC: 31.8 g/dL (ref 30.0–36.0)
MCV: 91.6 fL (ref 80.0–100.0)
Monocytes Absolute: 1.5 10*3/uL — ABNORMAL HIGH (ref 0.1–1.0)
Monocytes Relative: 9 %
Neutro Abs: 14.6 10*3/uL — ABNORMAL HIGH (ref 1.7–7.7)
Neutrophils Relative %: 83 %
Platelets: 719 10*3/uL — ABNORMAL HIGH (ref 150–400)
RBC: 3.09 MIL/uL — ABNORMAL LOW (ref 3.87–5.11)
RDW: 13.2 % (ref 11.5–15.5)
WBC: 17.3 10*3/uL — ABNORMAL HIGH (ref 4.0–10.5)
nRBC: 0 % (ref 0.0–0.2)

## 2019-12-03 LAB — MAGNESIUM: Magnesium: 1.7 mg/dL (ref 1.7–2.4)

## 2019-12-03 LAB — GRAM STAIN

## 2019-12-03 LAB — PHOSPHORUS: Phosphorus: <1 mg/dL — CL (ref 2.5–4.6)

## 2019-12-03 MED ORDER — SODIUM CHLORIDE 0.9 % IV SOLN
INTRAVENOUS | Status: DC
  Administered 2019-12-04 – 2019-12-05 (×2): via INTRAVENOUS

## 2019-12-03 MED ORDER — SODIUM PHOSPHATES 45 MMOLE/15ML IV SOLN
30.0000 mmol | Freq: Once | INTRAVENOUS | Status: AC
  Administered 2019-12-03: 30 mmol via INTRAVENOUS
  Filled 2019-12-03: qty 10

## 2019-12-03 MED ORDER — IPRATROPIUM-ALBUTEROL 0.5-2.5 (3) MG/3ML IN SOLN
3.0000 mL | RESPIRATORY_TRACT | Status: DC | PRN
  Administered 2019-12-05: 3 mL via RESPIRATORY_TRACT
  Filled 2019-12-03: qty 3

## 2019-12-03 NOTE — Evaluation (Signed)
Physical Therapy Evaluation Patient Details Name: [name] MRN: [id number] DOB: 12/12/1956 Today's Date: 12/03/2019   History of Present Illness  Pt admitted 2* ongoing diarrhea and weakness and dx with dehydration, acute viral gastroenteritis, and L pleural effusion - Thoracentesis performed 12/5 with 1.5 L drawn off..  Per pt's dtr, probable second Thoracentesis to be performed to remove additional fluid.  Pt with hx of Scleroderma, CKD, anemia  Clinical Impression  Pt admitted as above and presenting with functional mobility limitations 2* generalized weakness, decreased endurance and mild balance deficits.  Pt should progress to dc home with assist of family.    Follow Up Recommendations No PT follow up    Equipment Recommendations  None recommended by PT(Pt states she may want BSC but wants to think about it)    Recommendations for Other Services       Precautions / Restrictions Precautions Precautions: Fall Restrictions Weight Bearing Restrictions: No      Mobility  Bed Mobility Overal bed mobility: Modified Independent             General bed mobility comments: no assist supine to sit  Transfers Overall transfer level: Needs assistance Equipment used: None Transfers: Sit to/from Stand Sit to Stand: Min guard         General transfer comment: steady assist only  Ambulation/Gait Ambulation/Gait assistance: Min assist;Min guard Gait Distance (Feet): 180 Feet(and 15' into bathroom) Assistive device: 1 person hand held assist Gait Pattern/deviations: Step-through pattern;Decreased step length - right;Decreased step length - left;Shuffle;Trunk flexed     General Gait Details: mild general instability with no overt LOB.  Pt reports mild SOB with SaO2 ranging from 69 -100% on 3L O2 but HR maintaining between 104 and 108  Stairs            Wheelchair Mobility    Modified Rankin (Stroke Patients Only)       Balance Overall balance  assessment: Needs assistance Sitting-balance support: No upper extremity supported;Feet supported Sitting balance-Leahy Scale: Good     Standing balance support: No upper extremity supported Standing balance-Leahy Scale: Fair                               Pertinent Vitals/Pain Pain Assessment: No/denies pain    Home Living Family/patient expects to be discharged to:: Private residence Living Arrangements: Children Available Help at Discharge: Family;Available 24 hours/day Type of Home: House Home Access: Stairs to enter Entrance Stairs-Rails: Right Entrance Stairs-Number of Steps: 2 Home Layout: One level Home Equipment: None Additional Comments: Pt states she can get a RW or cane if needed    Prior Function Level of Independence: Independent               Hand Dominance        Extremity/Trunk Assessment   Upper Extremity Assessment Upper Extremity Assessment: Generalized weakness    Lower Extremity Assessment Lower Extremity Assessment: Generalized weakness       Communication   Communication: No difficulties  Cognition Arousal/Alertness: Awake/alert Behavior During Therapy: WFL for tasks assessed/performed Overall Cognitive Status: Within Functional Limits for tasks assessed                                        General Comments      Exercises     Assessment/Plan    PT  Assessment Patient needs continued PT services  PT Problem List Decreased strength;Decreased activity tolerance;Decreased balance;Decreased mobility;Decreased knowledge of use of DME       PT Treatment Interventions DME instruction;Gait training;Stair training;Functional mobility training;Therapeutic activities;Therapeutic exercise;Balance training;Patient/family education    PT Goals (Current goals can be found in the Care Plan section)  Acute Rehab PT Goals Patient Stated Goal: Regain IND PT Goal Formulation: With patient Time For Goal  Achievement: 12/17/19 Potential to Achieve Goals: Good    Frequency Min 3X/week   Barriers to discharge        Co-evaluation               AM-PAC PT "6 Clicks" Mobility  Outcome Measure Help needed turning from your back to your side while in a flat bed without using bedrails?: None Help needed moving from lying on your back to sitting on the side of a flat bed without using bedrails?: None Help needed moving to and from a bed to a chair (including a wheelchair)?: A Little Help needed standing up from a chair using your arms (e.g., wheelchair or bedside chair)?: A Little Help needed to walk in hospital room?: A Little Help needed climbing 3-5 steps with a railing? : A Little 6 Click Score: 20    End of Session Equipment Utilized During Treatment: Gait belt;Oxygen Activity Tolerance: Patient tolerated treatment well;Patient limited by fatigue Patient left: in chair;with call bell/phone within reach;with family/visitor present Nurse Communication: Mobility status PT Visit Diagnosis: Muscle weakness (generalized) (M62.81);Difficulty in walking, not elsewhere classified (R26.2)    Time: 1540-0867 PT Time Calculation (min) (ACUTE ONLY): 23 min   Charges:   PT Evaluation $PT Eval Low Complexity: 1 Low PT Treatments $Gait Training: 8-22 mins        Debe Coder PT Acute Rehabilitation Services Pager [phone] Office [phone]   Jebadiah Imperato 12/03/2019, 12:45 PM

## 2019-12-03 NOTE — Progress Notes (Signed)
Patient desat to 76-80% on room air. Placed on 3L of oxygen via [place] and recovered to 94%.patient denies feeling short of breath. Lungs are diminished with expiratory wheezing. On call provider notified, see new orders.

## 2019-12-03 NOTE — Progress Notes (Signed)
CRITICAL VALUE ALERT  Critical Value:  Phosphorus <1.0  Date & Time Notied:  12/03/19; Abby D, CN    Provider Notified: NA; provider shaw results and orders placed to correct  Orders Received/Actions taken: See above comment    S[place]ittemore, RN

## 2019-12-03 NOTE — Progress Notes (Signed)
PROGRESS NOTE  [name]  DOB: 1956/01/13  PCP: Genia Del TGG:[id number]  DOA: 12/01/2019  LOS: 2 days   Chief Complaint  Patient presents with  . Abdominal Pain  . Diarrhea   Brief narrative: [name] is a 63 y.o. female with medical history significant for scleroderma on remission, esophageal stricture, hypertension, chronic kidney disease stage III, asthma and anemia of chronic disease. Shepresented to the ED on 12/4 with complaint of diarrhea, weakness.   2 weeks ago, patient started having profuse diarrhea with nausea, vomiting, poor appetite and it lasted for 7 to 10 days.  She was subsequently started on Imodium by her primary care provider and it slowed down and has almost completely resolved now.  However, her weakness did not improve.  She has very little energy to move around and get short of breath on minimal exertion.  Hence presented to the ED on 12/4.   In the ED, patient was not febrile, blood pressure chronically low in 90s. Work-up showed sodium level low at 123, potassium elevated to 5.2, creatinine elevated to 1.96, albumin 3, lipase 24, WBC count 15.4, hemoglobin 9.8, platelets 728. Urinalysis with clear urine.  CT scan of chest, abdomen and pelvis showed: 1. Large left pleural effusion is identified with near complete atelectasis of the left lower lobe.  2. There are multifocal areas of interlobular septal thickening and ground-glass attenuation within the right lung which may reflect an inflammatory or infectious process. 3. Mild diffuse wall thickening and pericolonic soft tissue stranding involving the colon particularly at the level of the hepatic flexure. Findings compatible with colitis. 4. Aortic atherosclerosis. 5. Thin walled dilated esophagus with air-fluid level compatible with the clinical history of scleroderma. 6. Hepatic steatosis.  Patient was admitted to hospitalist medicine service for further evaluation and management.  Subjective: Patient was seen and examined this morning.  Pleasant middle-aged Caucasian female.  Looks older for age.   Diarrhea improved.  Remains on oxygen supplementation by nasal cannula.   Labs from this morning noted.  Potassium still remains elevated at 5.5, creatinine remains elevated at 1.55, WBC count slightly better at 17.3 today.  Assessment/Plan: Patient most likely had viral gastroenteritis which resolved on its own but also caused dehydration, weakness, acute kidney injury and presentation to the ED.  Acute viral gastroenteritis  -Mild diffuse wall thickening and pericolonic soft tissue stranding involving the colon particularly at the level of the hepatic flexure. Findings compatible with colitis. -Clinical symptoms resolved. -No fever, WBC count was elevated on admission, improving without antibiotics -Observe off antibiotics -Patient denies having any hx of chronic diarrhea.  Generalized weakness -Multifactorial : Dehydration, dyselectrolytemia, large pleural effusion. -Encourage appetite, hydration and ambulation. -PT evaluation ordered.  Hypotension -Patient states that her blood pressure is always low in 90s.  For some reason, she is still taking amlodipine 5 mg twice daily and lisinopril at home.  Both of them have been on hold for now.  I would not resume them unless blood pressure is 140/90 or above. -Blood pressure somewhat better, on low normal range today.  Acute kidney injury/hyperkalemia -Likely secondary to poor oral intake, fluid loss. -On admission, creatinine was elevated to 1.96 and potassium was elevated to 5.2. -Today, potassium is at 5.5, creatinine at 1.55.  Continue IV hydration.  Large left pleural effusion  -Unclear etiology.  I wonder if it is related to scleroderma itself.   -Underwent thoracentesis on 12/5 1.5 L of yellow fluid removed.  More  fluid could not be removed because of hypotension.  Fluid analysis showed glucose 72, LDH 106,  protein 4.1, cytology sent. cultures awaited. -Patient may benefit from repeat paracentesis on Monday/Tuesday.  History of scleroderma -Follows up with rheumatologist Dr. Kathlene November.  Not on any therapy for scleroderma at this time.  Esophageal stricture -Related to scleroderma itself. -Continue PPI.  Mobility: Encourage ambulation Diet: Soft diet Fluid: NS at 75 mL/h DVT prophylaxis:  SCDs Code Status:  Full code Family Communication:  Daughter at bedside Expected Discharge: Hopefully home in next 2 to 3 days.  Consultants:  IR  Procedures:    Antimicrobials: Anti-infectives (From admission, onward)   Start     Dose/Rate Route Frequency Ordered Stop   12/02/19 0800  metroNIDAZOLE (FLAGYL) IVPB 500 mg  Status:  Discontinued     500 mg 100 mL/hr over 60 Minutes Intravenous Every 8 hours 12/02/19 0458 12/02/19 1047        Code Status: Full Code   Diet Order            DIET SOFT Room service appropriate? Yes; Fluid consistency: Thin  Diet effective now              Infusions:  . sodium chloride 75 mL/hr at 12/03/19 1346    Scheduled Meds: . sodium chloride flush  3 mL Intravenous Once    PRN meds:    Objective: Vitals:   12/03/19 0527 12/03/19 1350  BP: (!) 97/48 (!) 89/50  Pulse: 92 83  Resp: 20 16  Temp: 98.3 F (36.8 C) 98.5 F (36.9 C)  SpO2:  99%   No intake or output data in the 24 hours ending 12/03/19 1527 Filed Weights   12/01/19 1509  Weight: 50.6 kg   Weight change:  Body mass index is 19.77 kg/m.   Physical Exam: General exam: Appears calm and comfortable.  Skin: No rashes, lesions or ulcers. HEENT: Atraumatic, normocephalic, supple neck, no obvious bleeding Lungs: Diminished but improved air entry on the left side with a pleural effusion CVS: Regular rate and rhythm, no murmur GI/Abd soft, nontender, nondistended, bowel sound present CNS: Alert, awake, oriented x3 Psychiatry: Mood appropriate Extremities: No pedal edema, no  calf tenderness.  Data Review: I have personally reviewed the laboratory data and studies available.  Recent Labs  Lab 11/30/19 1119 12/01/19 1518 12/02/19 0758 12/03/19 0503  WBC 16.6* 15.4* 19.6* 17.3*  NEUTROABS 14.2*  --  17.0* 14.6*  HGB 9.9* 9.8* 9.1* 9.0*  HCT 30.6* 30.8* 29.8* 28.3*  MCV 91 91.4 94.3 91.6  PLT 727* 728* 694* 719*   Recent Labs  Lab 11/30/19 1119 12/01/19 1518 12/02/19 0758 12/03/19 0503 12/03/19 0932  NA 122* 123* 130* 131*  --   K 5.8* 5.2* 5.3* 5.5*  --   CL 86* 90* 97* 100  --   CO2 19* 19* 16* 20*  --   GLUCOSE 100* 91 59* 94  --   BUN 14 21 18 16   --   CREATININE 1.55* 1.96* 1.57* 1.55*  --   CALCIUM 8.2* 8.2* 7.9* 7.9*  --   MG  --   --   --   --  1.7    Terrilee Croak, MD  [facility] 12/03/2019

## 2019-12-04 ENCOUNTER — Inpatient Hospital Stay (HOSPITAL_COMMUNITY): Payer: Medicare PPO

## 2019-12-04 LAB — COMPREHENSIVE METABOLIC PANEL
ALT: 10 U/L (ref 0–44)
AST: 14 U/L — ABNORMAL LOW (ref 15–41)
Albumin: 1.8 g/dL — ABNORMAL LOW (ref 3.5–5.0)
Alkaline Phosphatase: 60 U/L (ref 38–126)
Anion gap: 14 (ref 5–15)
BUN: 15 mg/dL (ref 8–23)
CO2: 16 mmol/L — ABNORMAL LOW (ref 22–32)
Calcium: 7.7 mg/dL — ABNORMAL LOW (ref 8.9–10.3)
Chloride: 100 mmol/L (ref 98–111)
Creatinine, Ser: 1.26 mg/dL — ABNORMAL HIGH (ref 0.44–1.00)
GFR calc Af Amer: 53 mL/min — ABNORMAL LOW (ref >60)
GFR calc non Af Amer: 46 mL/min — ABNORMAL LOW (ref >60)
Glucose, Bld: 97 mg/dL (ref 70–99)
Potassium: 5.1 mmol/L (ref 3.5–5.1)
Sodium: 130 mmol/L — ABNORMAL LOW (ref 135–145)
Total Bilirubin: 0.8 mg/dL (ref 0.3–1.2)
Total Protein: 5.1 g/dL — ABNORMAL LOW (ref 6.5–8.1)

## 2019-12-04 LAB — CBC WITH DIFFERENTIAL/PLATELET
Abs Immature Granulocytes: 0.06 10*3/uL (ref 0.00–0.07)
Basophils Absolute: 0.1 10*3/uL (ref 0.0–0.1)
Basophils Relative: 0 %
Eosinophils Absolute: 0.3 10*3/uL (ref 0.0–0.5)
Eosinophils Relative: 2 %
HCT: 26.5 % — ABNORMAL LOW (ref 36.0–46.0)
Hemoglobin: 8.4 g/dL — ABNORMAL LOW (ref 12.0–15.0)
Immature Granulocytes: 1 %
Lymphocytes Relative: 9 %
Lymphs Abs: 1 10*3/uL (ref 0.7–4.0)
MCH: 28.6 pg (ref 26.0–34.0)
MCHC: 31.7 g/dL (ref 30.0–36.0)
MCV: 90.1 fL (ref 80.0–100.0)
Monocytes Absolute: 1 10*3/uL (ref 0.1–1.0)
Monocytes Relative: 9 %
Neutro Abs: 9.1 10*3/uL — ABNORMAL HIGH (ref 1.7–7.7)
Neutrophils Relative %: 79 %
Platelets: 629 10*3/uL — ABNORMAL HIGH (ref 150–400)
RBC: 2.94 MIL/uL — ABNORMAL LOW (ref 3.87–5.11)
RDW: 13.3 % (ref 11.5–15.5)
WBC: 11.6 10*3/uL — ABNORMAL HIGH (ref 4.0–10.5)
nRBC: 0 % (ref 0.0–0.2)

## 2019-12-04 LAB — PHOSPHORUS: Phosphorus: 5.6 mg/dL — ABNORMAL HIGH (ref 2.5–4.6)

## 2019-12-04 LAB — TRIGLYCERIDES, BODY FLUIDS: Triglycerides, Fluid: 36 mg/dL

## 2019-12-04 MED ORDER — FLUTICASONE FUROATE-VILANTEROL 200-25 MCG/INH IN AEPB
1.0000 | INHALATION_SPRAY | Freq: Every day | RESPIRATORY_TRACT | Status: DC
  Administered 2019-12-05 – 2019-12-08 (×4): 1 via RESPIRATORY_TRACT
  Filled 2019-12-04: qty 28

## 2019-12-04 MED ORDER — LIDOCAINE HCL 1 % IJ SOLN
INTRAMUSCULAR | Status: AC
  Filled 2019-12-04: qty 20

## 2019-12-04 MED ORDER — PANTOPRAZOLE SODIUM 40 MG PO TBEC
40.0000 mg | DELAYED_RELEASE_TABLET | Freq: Two times a day (BID) | ORAL | Status: DC
  Administered 2019-12-04 – 2019-12-08 (×8): 40 mg via ORAL
  Filled 2019-12-04 (×9): qty 1

## 2019-12-04 NOTE — Progress Notes (Signed)
Physical Therapy Treatment Patient Details Name: [name] MRN: [id number] DOB: [birth year] Today's Date: 12/04/2019    History of Present Illness Pt admitted 2* ongoing diarrhea and weakness and dx with dehydration, acute viral gastroenteritis, and L pleural effusion - Thoracentesis performed 12/5 with 1.5 L drawn off..  Per pt's dtr, probable second Thoracentesis to be performed to remove additional fluid.  Pt with hx of Scleroderma, CKD, anemia    PT Comments    Progressing with mobility.    Follow Up Recommendations  Home health PT;Supervision - Intermittent     Equipment Recommendations  Rolling walker with 5" wheels    Recommendations for Other Services       Precautions / Restrictions Precautions Precautions: Fall Restrictions Weight Bearing Restrictions: No    Mobility  Bed Mobility Overal bed mobility: Modified Independent                Transfers Overall transfer level: Needs assistance Equipment used: Rolling walker (2 wheeled) Transfers: Sit to/from Stand Sit to Stand: Supervision            Ambulation/Gait Ambulation/Gait assistance: Min guard Gait Distance (Feet): 200 Feet Assistive device: Rolling walker (2 wheeled) Gait Pattern/deviations: Step-through pattern;Decreased stride length         Stairs             Wheelchair Mobility    Modified Rankin (Stroke Patients Only)       Balance Overall balance assessment: Needs assistance           Standing balance-Leahy Scale: Fair                              Cognition Arousal/Alertness: Awake/alert Behavior During Therapy: Flat affect Overall Cognitive Status: Within Functional Limits for tasks assessed                                        Exercises      General Comments        Pertinent Vitals/Pain Pain Assessment: No/denies pain    Home Living                      Prior Function            PT Goals  (current goals can now be found in the care plan section) Progress towards PT goals: Progressing toward goals    Frequency    Min 3X/week      PT Plan Current plan remains appropriate    Co-evaluation              AM-PAC PT "6 Clicks" Mobility   Outcome Measure  Help needed turning from your back to your side while in a flat bed without using bedrails?: None Help needed moving from lying on your back to sitting on the side of a flat bed without using bedrails?: None Help needed moving to and from a bed to a chair (including a wheelchair)?: A Little Help needed standing up from a chair using your arms (e.g., wheelchair or bedside chair)?: A Little Help needed to walk in hospital room?: A Little Help needed climbing 3-5 steps with a railing? : A Little 6 Click Score: 20    End of Session   Activity Tolerance: Patient tolerated treatment well Patient left: in bed;with call bell/phone within reach  PT Visit Diagnosis: Muscle weakness (generalized) (M62.81);Difficulty in walking, not elsewhere classified (R26.2)     Time: 0947-0962 PT Time Calculation (min) (ACUTE ONLY): 23 min  Charges:  $Gait Training: 23-37 mins                        Rebeca Alert, PT Acute Rehabilitation Services Pager: [phone] Office: [phone]

## 2019-12-04 NOTE — Care Management Important Message (Signed)
Important Message  Patient Details IM Letter given to Cookie McGibboney RN to present to the Patient Name: [name] MRN: [id number] Date of Birth: [birth year]   Medicare Important Message Given:  Yes     Kerin Salen 12/04/2019, 12:00 PM

## 2019-12-04 NOTE — Procedures (Signed)
PROCEDURE SUMMARY:  Successful US guided left thoracentesis. Yielded 550 mL of clear yellow fluid. Patient tolerated procedure well. No immediate complications. EBL = trace  Post procedure chest X-ray pending.  Dondrell Loudermilk S Eisley Barber PA-C 12/04/2019 12:15 PM

## 2019-12-04 NOTE — Progress Notes (Signed)
PROGRESS NOTE  [name]  DOB: 10/27/1956  PCP: Caryl Ada ZOX:[id number]  DOA: 12/01/2019  LOS: 3 days   Chief Complaint  Patient presents with  . Abdominal Pain  . Diarrhea   Brief narrative: [name] is a 63 y.o. female with medical history significant for scleroderma on remission, esophageal stricture, hypertension, chronic kidney disease stage III, asthma and anemia of chronic disease. Shepresented to the ED on 12/4 with complaint of diarrhea, weakness.   2 weeks ago, patient started having profuse diarrhea with nausea, vomiting, poor appetite and it lasted for 7 to 10 days.  She was subsequently started on Imodium by her primary care provider and it slowed down and has almost completely resolved now.  However, her weakness did not improve.  She has very little energy to move around and get short of breath on minimal exertion.  Hence presented to the ED on 12/4.   In the ED, patient was not febrile, blood pressure chronically low in 90s. Work-up showed sodium level low at 123, potassium elevated to 5.2, creatinine elevated to 1.96, albumin 3, lipase 24, WBC count 15.4, hemoglobin 9.8, platelets 728. Urinalysis with clear urine.  CT scan of chest, abdomen and pelvis showed: 1. Large left pleural effusion is identified with near complete atelectasis of the left lower lobe.  2. There are multifocal areas of interlobular septal thickening and ground-glass attenuation within the right lung which may reflect an inflammatory or infectious process. 3. Mild diffuse wall thickening and pericolonic soft tissue stranding involving the colon particularly at the level of the hepatic flexure. Findings compatible with colitis. 4. Aortic atherosclerosis. 5. Thin walled dilated esophagus with air-fluid level compatible with the clinical history of scleroderma. 6. Hepatic steatosis.  Patient was admitted to hospitalist medicine service for further evaluation and management.  Subjective: Patient was seen and examined this morning.   Feels better.  Not in distress.   WBC count improving, 11.6 today.  Potassium level improving 5.1 today.  Assessment/Plan: Patient most likely had viral gastroenteritis which resolved on its own but also caused dehydration, weakness, acute kidney injury leading to hospitalization  Acute viral gastroenteritis  -Mild diffuse wall thickening and pericolonic soft tissue stranding involving the colon particularly at the level of the hepatic flexure. Findings compatible with colitis. -Diarrhea and abdominal pain resolved. -No fever, WBC count was elevated on admission, improving without antibiotics -Observe off antibiotics -Patient denies having any hx of chronic diarrhea.  Generalized weakness -Multifactorial : Dehydration, dyselectrolytemia, large pleural effusion. -Encourage appetite, hydration and ambulation. -PT evaluation appreciated.  Home health PT recommended.  Hypotension -Patient states that her blood pressure is always low in 90s.  For some reason, she is still taking amlodipine 5 mg twice daily and lisinopril at home.  Both of them have been on hold for now.  I would not resume them unless blood pressure is 140/90 or above. -Blood pressure somewhat better, on low normal range today.  Acute kidney injury/hyperkalemia -Likely secondary to poor oral intake, fluid loss. -On admission, creatinine was elevated to 1.96 and potassium was elevated to 5.2. -Both of them improving.  Today, creatinine is 1.26 and potassium is 5.1.  Reduce IV normal saline to 50 mL/h  Large left pleural effusion  Acute respiratory with hypoxia -Unclear etiology of pleural effusion.  I wonder if it is related to scleroderma itself.   -Underwent thoracentesis on 12/5 1.5 L of yellow fluid removed.  More fluid could not be removed because  of hypotension.  Fluid analysis showed glucose 72, LDH 106, protein 4.1, cytology sent. cultures awaited. -Patient  underwent repeat paracentesis today, 550 mL removed. -Remains on 2 to 3 L oxygen by nasal cannula.  May need oxygen at discharge.  History of scleroderma -Follows up with rheumatologist Dr. Deanne CofferAryal.  Not on any therapy for scleroderma at this time.  Esophageal stricture -Related to scleroderma itself. -Continue PPI.  Mobility: Encourage ambulation Diet: Soft diet Fluid: NS at 50 mL/h DVT prophylaxis:  SCDs Code Status:  Full code Family Communication:  Patient updating family by herself Expected Discharge: Hopefully home tomorrow with oxygen.  Consultants:  IR  Procedures:    Antimicrobials: Anti-infectives (From admission, onward)   Start     Dose/Rate Route Frequency Ordered Stop   12/02/19 0800  metroNIDAZOLE (FLAGYL) IVPB 500 mg  Status:  Discontinued     500 mg 100 mL/hr over 60 Minutes Intravenous Every 8 hours 12/02/19 0458 12/02/19 1047        Code Status: Full Code   Diet Order            DIET SOFT Room service appropriate? Yes; Fluid consistency: Thin  Diet effective now              Infusions:  . sodium chloride Stopped (12/03/19 1600)    Scheduled Meds: . fluticasone furoate-vilanterol  1 puff Inhalation Daily  . lidocaine      . pantoprazole  40 mg Oral BID  . sodium chloride flush  3 mL Intravenous Once    PRN meds:    Objective: Vitals:   12/04/19 1218 12/04/19 1324  BP: (!) 90/52 (!) 87/52  Pulse:  85  Resp:  15  Temp:  99.7 F (37.6 C)  SpO2:  (!) 85%    Intake/Output Summary (Last 24 hours) at 12/04/2019 1805 Last data filed at 12/04/2019 1700 Gross per 24 hour  Intake 517.64 ml  Output 450 ml  Net 67.64 ml   Filed Weights   12/01/19 1509 12/04/19 1253  Weight: 50.6 kg 49.5 kg   Weight change:  Body mass index is 19.33 kg/m.   Physical Exam: General exam: Appears calm and comfortable.  Thin built. Skin: No rashes, lesions or ulcers. HEENT: Atraumatic, normocephalic, supple neck, no obvious bleeding Lungs:  Diminished air entry on left base because of pleural effusion. CVS: Regular rate and rhythm, no murmur GI/Abd soft, nontender, nondistended, bowel sound present CNS: Alert, awake, oriented x3 Psychiatry: Mood appropriate Extremities: No pedal edema, no calf tenderness.  Data Review: I have personally reviewed the laboratory data and studies available.  Recent Labs  Lab 11/30/19 1119 12/01/19 1518 12/02/19 0758 12/03/19 0503 12/04/19 0350  WBC 16.6* 15.4* 19.6* 17.3* 11.6*  NEUTROABS 14.2*  --  17.0* 14.6* 9.1*  HGB 9.9* 9.8* 9.1* 9.0* 8.4*  HCT 30.6* 30.8* 29.8* 28.3* 26.5*  MCV 91 91.4 94.3 91.6 90.1  PLT 727* 728* 694* 719* 629*   Recent Labs  Lab 11/30/19 1119 12/01/19 1518 12/02/19 0758 12/03/19 0503 12/03/19 0932 12/04/19 0350  NA 122* 123* 130* 131*  --  130*  K 5.8* 5.2* 5.3* 5.5*  --  5.1  CL 86* 90* 97* 100  --  100  CO2 19* 19* 16* 20*  --  16*  GLUCOSE 100* 91 59* 94  --  97  BUN 14 21 18 16   --  15  CREATININE 1.55* 1.96* 1.57* 1.55*  --  1.26*  CALCIUM 8.2* 8.2* 7.9* 7.9*  --  7.7*  MG  --   --   --   --  1.7  --   PHOS  --   --   --   --  <1.0* 5.6*    Lorin Glass, MD  [facility] 12/04/2019

## 2019-12-05 ENCOUNTER — Inpatient Hospital Stay (HOSPITAL_COMMUNITY): Payer: Medicare PPO

## 2019-12-05 LAB — CBC WITH DIFFERENTIAL/PLATELET
Abs Immature Granulocytes: 0.04 10*3/uL (ref 0.00–0.07)
Basophils Absolute: 0 10*3/uL (ref 0.0–0.1)
Basophils Relative: 0 %
Eosinophils Absolute: 0.5 10*3/uL (ref 0.0–0.5)
Eosinophils Relative: 6 %
HCT: 25.1 % — ABNORMAL LOW (ref 36.0–46.0)
Hemoglobin: 8.1 g/dL — ABNORMAL LOW (ref 12.0–15.0)
Immature Granulocytes: 1 %
Lymphocytes Relative: 14 %
Lymphs Abs: 1.1 10*3/uL (ref 0.7–4.0)
MCH: 28.8 pg (ref 26.0–34.0)
MCHC: 32.3 g/dL (ref 30.0–36.0)
MCV: 89.3 fL (ref 80.0–100.0)
Monocytes Absolute: 0.9 10*3/uL (ref 0.1–1.0)
Monocytes Relative: 12 %
Neutro Abs: 5.2 10*3/uL (ref 1.7–7.7)
Neutrophils Relative %: 67 %
Platelets: 574 10*3/uL — ABNORMAL HIGH (ref 150–400)
RBC: 2.81 MIL/uL — ABNORMAL LOW (ref 3.87–5.11)
RDW: 13.5 % (ref 11.5–15.5)
WBC: 7.7 10*3/uL (ref 4.0–10.5)
nRBC: 0 % (ref 0.0–0.2)

## 2019-12-05 LAB — COMPREHENSIVE METABOLIC PANEL
ALT: 9 U/L (ref 0–44)
AST: 11 U/L — ABNORMAL LOW (ref 15–41)
Albumin: 1.9 g/dL — ABNORMAL LOW (ref 3.5–5.0)
Alkaline Phosphatase: 49 U/L (ref 38–126)
Anion gap: 8 (ref 5–15)
BUN: 13 mg/dL (ref 8–23)
CO2: 21 mmol/L — ABNORMAL LOW (ref 22–32)
Calcium: 7.5 mg/dL — ABNORMAL LOW (ref 8.9–10.3)
Chloride: 102 mmol/L (ref 98–111)
Creatinine, Ser: 0.99 mg/dL (ref 0.44–1.00)
GFR calc Af Amer: >60 mL/min (ref >60)
GFR calc non Af Amer: >60 mL/min (ref >60)
Glucose, Bld: 95 mg/dL (ref 70–99)
Potassium: 4.5 mmol/L (ref 3.5–5.1)
Sodium: 131 mmol/L — ABNORMAL LOW (ref 135–145)
Total Bilirubin: 0.9 mg/dL (ref 0.3–1.2)
Total Protein: 5 g/dL — ABNORMAL LOW (ref 6.5–8.1)

## 2019-12-05 LAB — PHOSPHORUS: Phosphorus: 2.5 mg/dL (ref 2.5–4.6)

## 2019-12-05 MED ORDER — IPRATROPIUM-ALBUTEROL 0.5-2.5 (3) MG/3ML IN SOLN
3.0000 mL | Freq: Three times a day (TID) | RESPIRATORY_TRACT | Status: DC
  Administered 2019-12-05 – 2019-12-06 (×5): 3 mL via RESPIRATORY_TRACT
  Filled 2019-12-05 (×5): qty 3

## 2019-12-05 MED ORDER — IOHEXOL 9 MG/ML PO SOLN: 500.0000 mL | ORAL | Status: AC

## 2019-12-05 MED ORDER — IOHEXOL 9 MG/ML PO SOLN
ORAL | Status: AC
  Filled 2019-12-05: qty 1000

## 2019-12-05 MED ORDER — ACETAMINOPHEN 325 MG PO TABS
650.0000 mg | ORAL_TABLET | Freq: Four times a day (QID) | ORAL | Status: DC | PRN
  Administered 2019-12-05: 650 mg via ORAL
  Filled 2019-12-05 (×2): qty 2

## 2019-12-05 NOTE — Progress Notes (Signed)
PROGRESS NOTE  [name]  DOB: [birth year]  PCP: Caryl Ada XIP:[id number]  DOA: 12/01/2019  LOS: 4 days   Chief Complaint  Patient presents with  . Abdominal Pain  . Diarrhea   Brief narrative: [name] is a 63 y.o. female with medical history significant for scleroderma on remission, esophageal stricture, hypertension, chronic kidney disease stage III, asthma and anemia of chronic disease. She presented to the ED on 12/4 with complaint of diarrhea, weakness.   2 weeks ago, patient started having profuse diarrhea with nausea, vomiting, poor appetite and it lasted for 7 to 10 days. She was subsequently started on Imodium by her primary care provider and it slowed down and has almost completely resolved now.  However, her weakness did not improve.  She has very little energy to move around and get short of breath on minimal exertion.  Hence presented to the ED on 12/4.   In the ED, patient was not febrile, blood pressure chronically low in 90s. Work-up showed sodium level low at 123, potassium elevated to 5.2, creatinine elevated to 1.96, albumin 3, lipase 24, WBC count 15.4, hemoglobin 9.8, platelets 728. Urinalysis with clear urine.  CT scan of chest, abdomen and pelvis showed: 1. Large left pleural effusion is identified with near complete atelectasis of the left lower lobe.  2. There are multifocal areas of interlobular septal thickening and ground-glass attenuation within the right lung which may reflect an inflammatory or infectious process. 3. Mild diffuse wall thickening and pericolonic soft tissue stranding involving the colon particularly at the level of the hepatic flexure. Findings compatible with colitis. 4. Aortic atherosclerosis. 5. Thin walled dilated esophagus with air-fluid level compatible with the clinical history of scleroderma. 6. Hepatic steatosis.  Patient was admitted to hospitalist medicine service for further evaluation and management.  Subjective: Patient was seen and examined this morning.   No diarrhea.  Soft bowel movement every day.  But continues to have abdominal tenderness. WBC count improving, normal today.  Electrolytes improved as well.  But overall patient still feels weak.  She feels that her asthma has flared up despite the fact that she is able to breathe today without oxygen supplementation.  I think patient is concerned about not having support at home and hence not agreeable to discharge.  Assessment/Plan: Patient most likely had viral gastroenteritis which resolved on its own but also caused dehydration, weakness, acute kidney injury leading to hospitalization  Acute viral gastroenteritis  -Mild diffuse wall thickening and pericolonic soft tissue stranding involving the colon particularly at the level of the hepatic flexure. Findings compatible with colitis. -Diarrhea at the start and resolved even before presentation. -No fever, WBC count was elevated on admission, improving without antibiotics -Observe off antibiotics. -However patient still has significant tenderness on examination today.  Obtain a CT scan of abdomen today.  Unable to tolerate oral contrast.  Unable to give IV contrast because of AKI. -Patient denies having any hx of chronic diarrhea.  Large left pleural effusion  Acute respiratory failure with hypoxia -Unclear etiology of pleural effusion.  I wonder if it is related to scleroderma itself.   -Underwent thoracentesis twice 12/5 -1500 ml and 12/7 -500 mL of yellow fluid removed.  -Fluid analysis showed transudative pattern.   -Was able to come off oxygen at rest today.  May need on ambulation. -Patient states that she still feels weak and short of breath like an asthma flareup.  I will repeat CT scan of her  chest today.  Patient sees Dr. Delton CoombesByrum as an outpatient.  Consider consultation if significant findings on CT scan.  Generalized weakness -Multifactorial: Dehydration,  dyselectrolytemia, large pleural effusion. -Encourage appetite, hydration and ambulation. -PT evaluation appreciated.  Home health PT recommended.  Hypotension -Patient states that her blood pressure is always low in 90s. But she was still continuing amlodipine 5 mg twice daily and lisinopril at home. Both of them have been on hold for now. I would not resume them unless blood pressure is 140/90 or above. -Blood pressure somewhat better, on low normal range today. -Last echo from June 2020 is normal.  Acute kidney injury/hyperkalemia -Likely secondary to poor oral intake, fluid loss. -On admission, creatinine was elevated to 1.96 and potassium was elevated to 5.2. -Both of them improving.  Today, creatinine is 1.26 and potassium is 5.1.  Reduce IV normal saline to 50 mL/h  History of scleroderma -Follows up with rheumatologist Dr. Deanne CofferAryal.  Not on any therapy for scleroderma at this time.  Esophageal stricture -Related to scleroderma itself. -Continue PPI.  Mobility: Encourage ambulation Diet: Soft diet Fluid: NS at 50 mL/h DVT prophylaxis:  SCDs Code Status:  Full code Family Communication:  Patient updating family by herself Expected Discharge: Hopefully home tomorrow with home health.  No PD requirement at home  Consultants:  IR  Procedures:    Antimicrobials: Anti-infectives (From admission, onward)   Start     Dose/Rate Route Frequency Ordered Stop   12/02/19 0800  metroNIDAZOLE (FLAGYL) IVPB 500 mg  Status:  Discontinued     500 mg 100 mL/hr over 60 Minutes Intravenous Every 8 hours 12/02/19 0458 12/02/19 1047        Code Status: Full Code   Diet Order            DIET SOFT Room service appropriate? Yes; Fluid consistency: Thin  Diet effective now              Infusions:  . sodium chloride 50 mL/hr at 12/04/19 2230    Scheduled Meds: . fluticasone furoate-vilanterol  1 puff Inhalation Daily  . iohexol  500 mL Oral Q1H  . iohexol      .  ipratropium-albuterol  3 mL Nebulization TID  . pantoprazole  40 mg Oral BID  . sodium chloride flush  3 mL Intravenous Once    PRN meds:    Objective: Vitals:   12/05/19 1352 12/05/19 1419  BP: (!) 92/56   Pulse: 84   Resp:    Temp: 98.6 F (37 C)   SpO2: 96% 94%    Intake/Output Summary (Last 24 hours) at 12/05/2019 1603 Last data filed at 12/05/2019 0600 Gross per 24 hour  Intake 734.54 ml  Output 200 ml  Net 534.54 ml   Filed Weights   12/01/19 1509 12/04/19 1253  Weight: 50.6 kg 49.5 kg   Weight change:  Body mass index is 19.33 kg/m.   Physical Exam: General exam: Appears calm and comfortable.  Thin built.  Not on oxygen supplementation today Skin: No rashes, lesions or ulcers. HEENT: Atraumatic, normocephalic, supple neck, no obvious bleeding Lungs: Clear to auscultation bilaterally.  No wheezing, no crackles CVS: Regular rate and rhythm, no murmur GI/Abd soft, nontender, nondistended, bowel sound present CNS: Alert, awake, oriented x3 Psychiatry: Mood appropriate Extremities: No pedal edema, no calf tenderness.  Data Review: I have personally reviewed the laboratory data and studies available.  Recent Labs  Lab 11/30/19 1119 12/01/19 1518 12/02/19 0758 12/03/19 0503 12/04/19 0350  12/05/19 0344  WBC 16.6* 15.4* 19.6* 17.3* 11.6* 7.7  NEUTROABS 14.2*  --  17.0* 14.6* 9.1* 5.2  HGB 9.9* 9.8* 9.1* 9.0* 8.4* 8.1*  HCT 30.6* 30.8* 29.8* 28.3* 26.5* 25.1*  MCV 91 91.4 94.3 91.6 90.1 89.3  PLT 727* 728* 694* 719* 629* 574*   Recent Labs  Lab 12/01/19 1518 12/02/19 0758 12/03/19 0503 12/03/19 0932 12/04/19 0350 12/05/19 0344  NA 123* 130* 131*  --  130* 131*  K 5.2* 5.3* 5.5*  --  5.1 4.5  CL 90* 97* 100  --  100 102  CO2 19* 16* 20*  --  16* 21*  GLUCOSE 91 59* 94  --  97 95  BUN 21 18 16   --  15 13  CREATININE 1.96* 1.57* 1.55*  --  1.26* 0.99  CALCIUM 8.2* 7.9* 7.9*  --  7.7* 7.5*  MG  --   --   --  1.7  --   --   PHOS  --   --   --   <1.0* 5.6* 2.5    , MD  [facility] 12/05/2019

## 2019-12-06 ENCOUNTER — Other Ambulatory Visit: Payer: Self-pay

## 2019-12-06 DIAGNOSIS — M349 Systemic sclerosis, unspecified: Secondary | ICD-10-CM

## 2019-12-06 DIAGNOSIS — I959 Hypotension, unspecified: Secondary | ICD-10-CM

## 2019-12-06 LAB — GI PATHOGEN PANEL BY PCR, STOOL

## 2019-12-06 MED ORDER — MAGIC MOUTHWASH
10.0000 mL | Freq: Three times a day (TID) | ORAL | Status: DC | PRN
  Administered 2019-12-06: 10 mL via ORAL
  Filled 2019-12-06 (×3): qty 10

## 2019-12-06 MED ORDER — IPRATROPIUM-ALBUTEROL 0.5-2.5 (3) MG/3ML IN SOLN
3.0000 mL | Freq: Two times a day (BID) | RESPIRATORY_TRACT | Status: DC
  Administered 2019-12-07 – 2019-12-08 (×3): 3 mL via RESPIRATORY_TRACT
  Filled 2019-12-06 (×3): qty 3

## 2019-12-06 NOTE — Progress Notes (Signed)
Physical Therapy Treatment Patient Details Name: [name] MRN: [id number] DOB: 07-05-1956 Today's Date: 12/06/2019    History of Present Illness Pt admitted 2* ongoing diarrhea and weakness and dx with dehydration, acute viral gastroenteritis, and L pleural effusion - Thoracentesis performed 12/5 with 1.5 L drawn off..  Per pt's dtr, probable second Thoracentesis to be performed to remove additional fluid.  Pt with hx of Scleroderma, CKD, anemia    PT Comments    Progressing with mobility. O2 93% on RA after ambulation.    Follow Up Recommendations  Home health PT;Supervision - Intermittent     Equipment Recommendations  Rolling walker with 5" wheels(pt is requesting one)    Recommendations for Other Services       Precautions / Restrictions Precautions Precautions: Fall Restrictions Weight Bearing Restrictions: No    Mobility  Bed Mobility               General bed mobility comments: oob in recliner  Transfers Overall transfer level: Needs assistance Equipment used: Rolling walker (2 wheeled) Transfers: Sit to/from Stand Sit to Stand: Supervision            Ambulation/Gait Ambulation/Gait assistance: Supervision Gait Distance (Feet): 500 Feet Assistive device: Rolling walker (2 wheeled)       General Gait Details: good gait speed. pt likely doesn't need RW but she prefers to use it   Social research officer, government [employer] (Stroke Patients Only)       Balance                                            Cognition Arousal/Alertness: Awake/alert Behavior During Therapy: WFL for tasks assessed/performed Overall Cognitive Status: Within Functional Limits for tasks assessed                                        Exercises      General Comments        Pertinent Vitals/Pain Pain Assessment: No/denies pain    Home Living                      Prior Function             PT Goals (current goals can now be found in the care plan section) Progress towards PT goals: Progressing toward goals    Frequency    Min 3X/week      PT Plan Current plan remains appropriate    Co-evaluation              AM-PAC PT "6 Clicks" Mobility   Outcome Measure  Help needed turning from your back to your side while in a flat bed without using bedrails?: None Help needed moving from lying on your back to sitting on the side of a flat bed without using bedrails?: None Help needed moving to and from a bed to a chair (including a wheelchair)?: A Little Help needed standing up from a chair using your arms (e.g., wheelchair or bedside chair)?: A Little Help needed to walk in hospital room?: A Little Help needed climbing 3-5 steps with a railing? : A Little 6 Click Score: 20    End of  Session   Activity Tolerance: Patient tolerated treatment well Patient left: in chair;with call bell/phone within reach   PT Visit Diagnosis: Muscle weakness (generalized) (M62.81);Difficulty in walking, not elsewhere classified (R26.2)     Time: 6979-4801 PT Time Calculation (min) (ACUTE ONLY): 20 min  Charges:  $Gait Training: 8-22 mins                        Weston Anna, PT Acute Rehabilitation Services Pager: [phone] Office: [phone]

## 2019-12-06 NOTE — Progress Notes (Signed)
   12/06/19 0911  MEWS Score  Resp 14  Pulse Rate 89  BP (!) 90/50 (MD aware)  SpO2 96 %  O2 Device Nasal Cannula  O2 Flow Rate (L/min) 2 L/min  MEWS Score  MEWS RR 0  MEWS Pulse 0  MEWS Systolic 1  MEWS LOC 0  MEWS Temp 0  MEWS Score 1  MEWS Score Color Green  MEWS Assessment  Is this an acute change? No  MD aware of patients BP's. Asymptomatic. Will continue to monitor.

## 2019-12-06 NOTE — Progress Notes (Addendum)
Enteric precautions discontinued per MD [place] orders.

## 2019-12-06 NOTE — Progress Notes (Signed)
PROGRESS NOTE  [name]  DOB: October 24, 1956  PCP: Genia Del ZJQ:[id number]  DOA: 12/01/2019  LOS: 5 days   Chief Complaint  Patient presents with  . Abdominal Pain  . Diarrhea   Brief narrative: [name] is a 63 y.o. female with medical history significant for scleroderma on remission, esophageal stricture, hypertension, chronic kidney disease stage III, asthma and anemia of chronic disease, presented to the ED on 12/4 with complaint of diarrhea, weakness. In the ED, patient was not febrile, blood pressure chronically low in 90s. Work-up showed sodium level low at 123, potassium elevated to 5.2, creatinine elevated to 1.96, albumin 3, lipase 24, WBC count 15.4, hemoglobin 9.8, platelets 728. Urinalysis with clear urine. CT scan of chest, abdomen and pelvis showed large left pleural effusion is identified with near complete atelectasis of the left lower lobe. Patient was admitted to hospitalist medicine service for further evaluation and management.  Subjective: Patient seen and examined at bedside.  Continues to have generalized weakness, some shortness of breath, with mild abdominal pain, denies any chest pain, nausea/vomiting, fever/chills.  Assessment/Plan:  Possible acute viral gastroenteritis Afebrile, with no leukocytosis (improved with no antibiotics) Mild diffuse wall thickening and pericolonic soft tissue stranding involving the colon particularly at the level of the hepatic flexure. Findings compatible with colitis. Repeat CT abdomen pelvis showed nonspecific groundglass opacities within both lungs, small bilateral pleural effusions, distended esophagus filled with fluid compatible with scleroderma, proximal and mid small bowel wall thickening with focal area of mildly distended bowel, nonspecific but may represent an area of scleroderma changes or enteritis Continue to monitor off antibiotics  Large left pleural effusion  Acute respiratory failure with  hypoxia Resolved Unclear etiology of pleural effusion, ??scleroderma Underwent thoracentesis twice 12/5 -1500 ml and 12/7 -500 mL of yellow fluid removed.  -Fluid analysis showed transudative pattern.   Plan for home O2 eval Patient sees Dr. Delton Coombes as an outpatient.  Consider consultation if significant findings on CT scan  Generalized weakness Multifactorial: Dehydration, large pleural effusion. Encourage hydration and ambulation PT evaluation appreciated.  Home health PT recommended  Chronic hypotension Stable at SBP in the 90s Patient states that her blood pressure is always low in 90s Continue to hold BP meds (amlodipine/lisinopril) Last echo from June 2020 is normal  Chronic hyponatremia Daily BMP  Acute kidney injury Resolved S/p IVF  History of scleroderma Follows up with rheumatologist Dr. Deanne Coffer Not on any therapy for scleroderma at this time  Esophageal stricture Related to scleroderma Continue PPI.    DVT prophylaxis:  SCDs Code Status:  Full code Family Communication: None at bedside Expected Discharge: Likely home 12/07/19  Consultants:  IR  Procedures:  Thoracentesis  Antimicrobials: Anti-infectives (From admission, onward)   Start     Dose/Rate Route Frequency Ordered Stop   12/02/19 0800  metroNIDAZOLE (FLAGYL) IVPB 500 mg  Status:  Discontinued     500 mg 100 mL/hr over 60 Minutes Intravenous Every 8 hours 12/02/19 0458 12/02/19 1047        Code Status: Full Code   Diet Order            DIET SOFT Room service appropriate? Yes; Fluid consistency: Thin  Diet effective now              Infusions:  . sodium chloride 50 mL/hr at 12/05/19 1910    Scheduled Meds: . fluticasone furoate-vilanterol  1 puff Inhalation Daily  . ipratropium-albuterol  3 mL Nebulization TID  .  pantoprazole  40 mg Oral BID  . sodium chloride flush  3 mL Intravenous Once    PRN meds:    Objective: Vitals:   12/06/19 1338 12/06/19 1434  BP: (!) 108/59    Pulse: 85   Resp: 18   Temp: 97.9 F (36.6 C)   SpO2: 95% 97%    Intake/Output Summary (Last 24 hours) at 12/06/2019 1614 Last data filed at 12/06/2019 1600 Gross per 24 hour  Intake 1270 ml  Output -  Net 1270 ml   Filed Weights   12/01/19 1509 12/04/19 1253  Weight: 50.6 kg 49.5 kg   Weight change:  Body mass index is 19.33 kg/m.   Physical Exam:  General: NAD, thin, chronically ill appearing  Cardiovascular: S1, S2 present  Respiratory: CTAB  Abdomen: Soft, nontender, nondistended, bowel sounds present  Musculoskeletal: No bilateral pedal edema noted  Skin: Normal  Psychiatry: Normal mood   Data Review: I have personally reviewed the laboratory data and studies available.  Recent Labs  Lab 11/30/19 1119 12/01/19 1518 12/02/19 0758 12/03/19 0503 12/04/19 0350 12/05/19 0344  WBC 16.6* 15.4* 19.6* 17.3* 11.6* 7.7  NEUTROABS 14.2*  --  17.0* 14.6* 9.1* 5.2  HGB 9.9* 9.8* 9.1* 9.0* 8.4* 8.1*  HCT 30.6* 30.8* 29.8* 28.3* 26.5* 25.1*  MCV 91 91.4 94.3 91.6 90.1 89.3  PLT 727* 728* 694* 719* 629* 574*   Recent Labs  Lab 12/01/19 1518 12/02/19 0758 12/03/19 0503 12/03/19 0932 12/04/19 0350 12/05/19 0344  NA 123* 130* 131*  --  130* 131*  K 5.2* 5.3* 5.5*  --  5.1 4.5  CL 90* 97* 100  --  100 102  CO2 19* 16* 20*  --  16* 21*  GLUCOSE 91 59* 94  --  97 95  BUN 21 18 16   --  15 13  CREATININE 1.96* 1.57* 1.55*  --  1.26* 0.99  CALCIUM 8.2* 7.9* 7.9*  --  7.7* 7.5*  MG  --   --   --  1.7  --   --   PHOS  --   --   --  <1.0* 5.6* 2.5    Alma Friendly, MD  [facility] 12/06/2019

## 2019-12-07 ENCOUNTER — Inpatient Hospital Stay (HOSPITAL_COMMUNITY): Payer: Medicare PPO

## 2019-12-07 LAB — CYTOLOGY - NON PAP

## 2019-12-07 LAB — CULTURE, BODY FLUID W GRAM STAIN -BOTTLE: Culture: NO GROWTH

## 2019-12-07 MED ORDER — SODIUM CHLORIDE 0.9 % IV SOLN
3.0000 g | Freq: Four times a day (QID) | INTRAVENOUS | Status: DC
  Administered 2019-12-07 – 2019-12-08 (×5): 3 g via INTRAVENOUS
  Filled 2019-12-07 (×3): qty 3
  Filled 2019-12-07: qty 8
  Filled 2019-12-07: qty 3
  Filled 2019-12-07 (×2): qty 8
  Filled 2019-12-07: qty 3

## 2019-12-07 NOTE — TOC Progression Note (Signed)
Transition of Care [facility]) - Progression Note    Patient Details  Name: [name] MRN: [id number] Date of Birth: 1956-04-28  Transition of Care [facility]) CM/SW Contact  Purcell Mouton, RN Phone Number: 12/07/2019, 1:49 PM  Clinical Narrative:    Spoke with pt concerning HHPT. Pt will discharge with [facility] and [facility] for DME. Pt is aware.       Expected Discharge Plan and Services                                                 Social Determinants of Health (SDOH) Interventions    Readmission Risk Interventions No flowsheet data found.

## 2019-12-07 NOTE — Progress Notes (Signed)
PHARMACY NOTE -  Unasyn  Pharmacy has been assisting with dosing of Unasyn for aspiration PNA.  Dosage remains stable at 3g IV q6 hr and need for further dosage adjustment appears unlikely at present given SCr at baseline  Note patient reports PCN allergy (rash, >10 yr ago, no severe symptoms), but states she has tolerated amoxicillin. Have instructed RN to monitor patient for any signs of allergic reaction.  Pharmacy will sign off, following peripherally for culture results or dose adjustments. Please reconsult if a change in clinical status warrants re-evaluation of dosage.  Reuel Boom, PharmD, BCPS [phone] 12/07/2019, 11:28 AM

## 2019-12-07 NOTE — Progress Notes (Signed)
PROGRESS NOTE  [name]  DOB: 1956/09/22  PCP: Caryl Ada HCW:[id number]  DOA: 12/01/2019  LOS: 6 days   Chief Complaint  Patient presents with  . Abdominal Pain  . Diarrhea   Brief narrative: NECOLE [name] is a 63 y.o. female with medical history significant for scleroderma on remission, esophageal stricture, hypertension, chronic kidney disease stage III, asthma and anemia of chronic disease, presented to the ED on 12/4 with complaint of diarrhea, weakness. In the ED, patient was not febrile, blood pressure chronically low in 90s. Work-up showed sodium level low at 123, potassium elevated to 5.2, creatinine elevated to 1.96, albumin 3, lipase 24, WBC count 15.4, hemoglobin 9.8, platelets 728. Urinalysis with clear urine. CT scan of chest, abdomen and pelvis showed large left pleural effusion is identified with near complete atelectasis of the left lower lobe. Patient was admitted to hospitalist medicine service for further evaluation and management.  Subjective: Patient seen and examined at bedside, continues to have generalized weakness, some shortness of breath, denies any chest pain, abdominal pain, nausea/vomiting, diarrhea, fever/chills.  Assessment/Plan:  Possible acute viral gastroenteritis Afebrile, with no leukocytosis (improved with no antibiotics) Mild diffuse wall thickening and pericolonic soft tissue stranding involving the colon particularly at the level of the hepatic flexure. Findings compatible with colitis. Repeat CT abdomen pelvis showed nonspecific groundglass opacities within both lungs, small bilateral pleural effusions, distended esophagus filled with fluid compatible with scleroderma, proximal and mid small bowel wall thickening with focal area of mildly distended bowel, nonspecific but may represent an area of scleroderma changes or enteritis Continue to monitor off antibiotics Follow-up with GI as an outpatient  Large left pleural  effusion  Acute respiratory failure with hypoxia likely 2/2 ?Aspiration pneumonia due to history of dysphagia 2/2 scleroderma Unclear etiology of pleural effusion, ??scleroderma Underwent thoracentesis twice 12/5 -1500 ml and 12/7 -500 mL of yellow fluid removed.  -Fluid analysis showed transudative pattern.   Repeat chest x-ray on 12/07/2019 showed persistent airspace consolidation over the right upper lobe likely pneumonia, small bilateral pleural effusions Started on IV Unasyn Patient sees Dr. Lamonte Sakai as an outpatient, follow-up  Generalized weakness Multifactorial: Dehydration, large pleural effusion. Encourage hydration and ambulation PT evaluation appreciated.  Home health PT recommended  Chronic hypotension Stable at SBP in the 90s Patient states that her blood pressure is always low in 90s Continue to hold BP meds (amlodipine/lisinopril) Last echo from June 2020 is normal  Chronic hyponatremia Daily BMP  Acute kidney injury Resolved S/p IVF  History of scleroderma Follows up with rheumatologist Dr. Kathlene November Not on any therapy for scleroderma at this time  Esophageal stricture Related to scleroderma Continue PPI.    DVT prophylaxis:  SCDs Code Status:  Full code Family Communication: None at bedside Expected Discharge: Likely home 12/08/19  Consultants:  IR  Procedures:  Thoracentesis  Antimicrobials: Anti-infectives (From admission, onward)   Start     Dose/Rate Route Frequency Ordered Stop   12/07/19 1200  Ampicillin-Sulbactam (UNASYN) 3 g in sodium chloride 0.9 % 100 mL IVPB     3 g 200 mL/hr over 30 Minutes Intravenous Every 6 hours 12/07/19 1127     12/02/19 0800  metroNIDAZOLE (FLAGYL) IVPB 500 mg  Status:  Discontinued     500 mg 100 mL/hr over 60 Minutes Intravenous Every 8 hours 12/02/19 0458 12/02/19 1047        Code Status: Full Code   Diet Order  DIET SOFT Room service appropriate? Yes; Fluid consistency: Thin  Diet effective  now              Infusions:  . ampicillin-sulbactam (UNASYN) IV 3 g (12/07/19 1234)    Scheduled Meds: . fluticasone furoate-vilanterol  1 puff Inhalation Daily  . ipratropium-albuterol  3 mL Nebulization BID  . pantoprazole  40 mg Oral BID  . sodium chloride flush  3 mL Intravenous Once    PRN meds:    Objective: Vitals:   12/07/19 0724 12/07/19 1329  BP:  (!) 91/55  Pulse:  80  Resp:  20  Temp:  98.6 F (37 C)  SpO2: 96% 99%    Intake/Output Summary (Last 24 hours) at 12/07/2019 1547 Last data filed at 12/06/2019 1725 Gross per 24 hour  Intake 100 ml  Output --  Net 100 ml   Filed Weights   12/01/19 1509 12/04/19 1253  Weight: 50.6 kg 49.5 kg   Weight change:  Body mass index is 19.33 kg/m.   Physical Exam:  General: NAD, thin, chronically ill-appearing  Cardiovascular: S1, S2 present  Respiratory: CTAB  Abdomen: Soft, nontender, nondistended, bowel sounds present  Musculoskeletal: No bilateral pedal edema noted  Skin: Normal  Psychiatry: Normal mood   Data Review: I have personally reviewed the laboratory data and studies available.  Recent Labs  Lab 12/01/19 1518 12/02/19 0758 12/03/19 0503 12/04/19 0350 12/05/19 0344  WBC 15.4* 19.6* 17.3* 11.6* 7.7  NEUTROABS  --  17.0* 14.6* 9.1* 5.2  HGB 9.8* 9.1* 9.0* 8.4* 8.1*  HCT 30.8* 29.8* 28.3* 26.5* 25.1*  MCV 91.4 94.3 91.6 90.1 89.3  PLT 728* 694* 719* 629* 574*   Recent Labs  Lab 12/01/19 1518 12/02/19 0758 12/03/19 0503 12/03/19 0932 12/04/19 0350 12/05/19 0344  NA 123* 130* 131*  --  130* 131*  K 5.2* 5.3* 5.5*  --  5.1 4.5  CL 90* 97* 100  --  100 102  CO2 19* 16* 20*  --  16* 21*  GLUCOSE 91 59* 94  --  97 95  BUN 21 18 16   --  15 13  CREATININE 1.96* 1.57* 1.55*  --  1.26* 0.99  CALCIUM 8.2* 7.9* 7.9*  --  7.7* 7.5*  MG  --   --   --  1.7  --   --   PHOS  --   --   --  <1.0* 5.6* 2.5    , MD  [facility] 12/07/2019

## 2019-12-07 NOTE — Progress Notes (Signed)
Physical Therapy Treatment Patient Details Name: [name] MRN: [id number] DOB: 1956/03/09 Today's Date: 12/07/2019    History of Present Illness Pt admitted 2* ongoing diarrhea and weakness and dx with dehydration, acute viral gastroenteritis, and L pleural effusion - Thoracentesis performed 12/5 with 1.5 L drawn off..  Per pt's dtr, probable second Thoracentesis to be performed to remove additional fluid.  Pt with hx of Scleroderma, CKD, anemia    PT Comments    Pt used bathroom independently and then ambulated in hallway.  Pt tolerated good distance and denies dyspnea.  Pt's SPO2 monitor would not read during ambulation or after session (pt with cold hands).  Follow Up Recommendations  Home health PT;Supervision - Intermittent     Equipment Recommendations  Rolling walker with 5" wheels(per pt request)    Recommendations for Other Services       Precautions / Restrictions Precautions Precautions: Fall    Mobility  Bed Mobility Overal bed mobility: Modified Independent                Transfers Overall transfer level: Modified independent                  Ambulation/Gait Ambulation/Gait assistance: Supervision Gait Distance (Feet): 400 Feet Assistive device: Rolling walker (2 wheeled) Gait Pattern/deviations: Step-through pattern;Decreased stride length     General Gait Details: good gait speed. pt likely doesn't need RW but she prefers to use it, SpO2 98% at rest room air and unable to read the rest of session however pt denies dyspnea and does not appear in any respiratory distress with mobility   Stairs             Wheelchair Mobility    Modified Rankin (Stroke Patients Only)       Balance           Standing balance support: No upper extremity supported Standing balance-Leahy Scale: Good Standing balance comment: pt able to stand at sink and wash hands without support, pt also leaned over toilet and cleaned off seat with  toilet paper without any unsteadiness                            Cognition Arousal/Alertness: Awake/alert Behavior During Therapy: WFL for tasks assessed/performed Overall Cognitive Status: Within Functional Limits for tasks assessed                                        Exercises      General Comments        Pertinent Vitals/Pain Pain Assessment: No/denies pain    Home Living                      Prior Function            PT Goals (current goals can now be found in the care plan section) Progress towards PT goals: Progressing toward goals    Frequency    Min 3X/week      PT Plan Current plan remains appropriate    Co-evaluation              AM-PAC PT "6 Clicks" Mobility   Outcome Measure  Help needed turning from your back to your side while in a flat bed without using bedrails?: None Help needed moving from lying on your back to sitting on  the side of a flat bed without using bedrails?: None Help needed moving to and from a bed to a chair (including a wheelchair)?: None Help needed standing up from a chair using your arms (e.g., wheelchair or bedside chair)?: A Little Help needed to walk in hospital room?: A Little Help needed climbing 3-5 steps with a railing? : A Little 6 Click Score: 21    End of Session   Activity Tolerance: Patient tolerated treatment well Patient left: in chair;with call bell/phone within reach   PT Visit Diagnosis: Muscle weakness (generalized) (M62.81);Difficulty in walking, not elsewhere classified (R26.2)     Time: 8841-6606 PT Time Calculation (min) (ACUTE ONLY): 19 min  Charges:  $Gait Training: 8-22 mins                    Arlyce Dice, DPT Acute Rehabilitation Services Office: [phone]   Trena Platt 12/07/2019, 12:59 PM

## 2019-12-07 NOTE — Care Management Important Message (Signed)
Important Message  Patient Details IM Letter given to Gabriel Earing RN Case Manager to present to the Patient Name: [name] MRN: [id number] Date of Birth: Jun 30, 1956   Medicare Important Message Given:  Yes     Kerin Salen 12/07/2019, 2:45 PM

## 2019-12-07 NOTE — Evaluation (Signed)
Clinical/Bedside Swallow Evaluation Patient Details  Name: [name] MRN: [id number] Date of Birth: 09/01/1956  Today's Date: 12/07/2019 Time: SLP Start Time (ACUTE ONLY): 1218 SLP Stop Time (ACUTE ONLY): 1238 SLP Time Calculation (min) (ACUTE ONLY): 20 min  Past Medical History:  Past Medical History:  Diagnosis Date  . Allergic rhinitis   . Asthma   . Atherosclerosis   . Chronic kidney disease (CKD), stage III (moderate)   . Esophageal stricture   . GERD (gastroesophageal reflux disease)   . History of anemia due to chronic kidney disease   . History of colitis 2017  . Hypertension   . Metabolic bone disease   . Raynaud disease   . Scleroderma [facility])    Past Surgical History:  Past Surgical History:  Procedure Laterality Date  . CESAREAN SECTION    . COLONOSCOPY WITH PROPOFOL N/A 07/14/2016   Procedure: COLONOSCOPY WITH PROPOFOL;  Surgeon: Rachael Fee, MD;  Location: [facility] ENDOSCOPY;  Service: Endoscopy;  Laterality: N/A;  . HAND SURGERY     left; tendon repair  . TONSILLECTOMY     HPI:  [name] is a 63 yo female with h/o scleroderma and esophageal strictures admitted to hospital with respiratory deficits, colitis and recent gastroenteritis.  She underwent a thoracentesis 12/02/2019.  Pt imaging study showed distended esophagus - fluid filled c/w scleroderma.  She has undergone esophageal dilatation - last being 2012.  States prior to dilatation she could not "swallow". Currently she reports oral cavity being uncomfortable and xerostomic.  She uses caution with foods like grits, etc and reports occasional issues with pills lodging in throat.  Pt admits to some weight loss which she attributes to scleroderma.  Swallow evaluation ordered.   Assessment / Plan / Recommendation Clinical Impression  Pt with limited mandibular, labial and lingual ROM due to her scleroderma which impacts her ability to masticate and swallow.  She however compensates well for her multifactorial  dysphagia and primary risk is her esophageal deficits.  She passed 3 ounce water test and takes small boluses of solids without demonstrating severe dysphagia/aspiration.  SLP advised she consume softer foods to compensatie for oral defictis and provided written compensations for reflux *on omeprazole BID for years*, xerostomia and esophageal dysmotility due to her scleroderma.  Pt reported understanding to information, need to follow up with GI and pulmonary as she indicates.  Primary asp risk esophageal for which pt reports understanding to precautions.  THanks for this consult. [name] SLP Visit Diagnosis: Dysphagia, pharyngoesophageal phase (R13.14)    Aspiration Risk  Moderate aspiration risk    Diet Recommendation Dysphagia 3 (Mech soft);Thin liquid   Liquid Administration via: Cup(pt can't seal lips on straw) Medication Administration: (as tolerated) Supervision: Patient able to self feed Compensations: Slow rate;Small sips/bites Postural Changes: Seated upright at 90 degrees;Remain upright for at least 30 minutes after po intake    Other  Recommendations Oral Care Recommendations: Oral care BID   Follow up Recommendations None      Frequency and Duration      n/a      Prognosis   n/a     Swallow Study   General Date of Onset: 12/07/19 HPI: [name] is a 63 yo female with h/o scleroderma and esophageal strictures admitted to hospital with respiratory deficits, colitis and recent gastroenteritis.  She underwent a thoracentesis 12/02/2019.  Pt imaging study showed distended esophagus - fluid filled c/w scleroderma.  She has undergone esophageal dilatation - last being 2012.  States  prior to dilatation she could not "swallow". Currently she reports oral cavity being uncomfortable and xerostomic.  She uses caution with foods like grits, etc and reports occasional issues with pills lodging in throat.  Pt admits to some weight loss which she attributes to scleroderma.  Swallow evaluation  ordered. Type of Study: Bedside Swallow Evaluation Diet Prior to this Study: Dysphagia 3 (soft);Thin liquids Temperature Spikes Noted: No Respiratory Status: Room air History of Recent Intubation: No Behavior/Cognition: Alert;Cooperative Oral Cavity Assessment: Dry Oral Care Completed by SLP: No Oral Cavity - Dentition: Adequate natural dentition Vision: Functional for self-feeding Self-Feeding Abilities: Able to feed self Patient Positioning: Upright in chair Baseline Vocal Quality: Low vocal intensity Volitional Cough: Strong Volitional Swallow: Able to elicit    Oral/Motor/Sensory Function Overall Oral Motor/Sensory Function: Other (comment)(pt with limited mandibular ROM due to her scleroderma, appears with minimal palatal deviation to the right upon phonation but voice is strong)   Ice Chips Ice chips: Not tested   Thin Liquid Thin Liquid: Within functional limits Presentation: Cup;Self Fed Other Comments: inconsistent throat clearing after intake; pt able to pass 3 ounce water test    Nectar Thick Nectar Thick Liquid: Not tested   Honey Thick Honey Thick Liquid: Not tested   Puree Presentation: Self Fed;Spoon Other Comments: odynophagia reported   Solid     Solid: Not tested      [name] 12/07/2019,12:53 PM    [name], [place] Pager [phone] Office [phone]

## 2019-12-08 DIAGNOSIS — J690 Pneumonitis due to inhalation of food and vomit: Secondary | ICD-10-CM

## 2019-12-08 DIAGNOSIS — D509 Iron deficiency anemia, unspecified: Secondary | ICD-10-CM

## 2019-12-08 LAB — CBC WITH DIFFERENTIAL/PLATELET
Abs Immature Granulocytes: 0.04 10*3/uL (ref 0.00–0.07)
Basophils Absolute: 0.1 10*3/uL (ref 0.0–0.1)
Basophils Relative: 1 %
Eosinophils Absolute: 0.7 10*3/uL — ABNORMAL HIGH (ref 0.0–0.5)
Eosinophils Relative: 14 %
HCT: 23.7 % — ABNORMAL LOW (ref 36.0–46.0)
Hemoglobin: 7.5 g/dL — ABNORMAL LOW (ref 12.0–15.0)
Immature Granulocytes: 1 %
Lymphocytes Relative: 23 %
Lymphs Abs: 1.1 10*3/uL (ref 0.7–4.0)
MCH: 27.9 pg (ref 26.0–34.0)
MCHC: 31.6 g/dL (ref 30.0–36.0)
MCV: 88.1 fL (ref 80.0–100.0)
Monocytes Absolute: 0.7 10*3/uL (ref 0.1–1.0)
Monocytes Relative: 14 %
Neutro Abs: 2.3 10*3/uL (ref 1.7–7.7)
Neutrophils Relative %: 47 %
Platelets: 522 10*3/uL — ABNORMAL HIGH (ref 150–400)
RBC: 2.69 MIL/uL — ABNORMAL LOW (ref 3.87–5.11)
RDW: 13.6 % (ref 11.5–15.5)
WBC: 4.9 10*3/uL (ref 4.0–10.5)
nRBC: 0 % (ref 0.0–0.2)

## 2019-12-08 LAB — IRON AND TIBC
Iron: 10 ug/dL — ABNORMAL LOW (ref 28–170)
Saturation Ratios: 7 % — ABNORMAL LOW (ref 10.4–31.8)
TIBC: 151 ug/dL — ABNORMAL LOW (ref 250–450)
UIBC: 141 ug/dL

## 2019-12-08 LAB — BASIC METABOLIC PANEL
Anion gap: 8 (ref 5–15)
BUN: 9 mg/dL (ref 8–23)
CO2: 25 mmol/L (ref 22–32)
Calcium: 7.6 mg/dL — ABNORMAL LOW (ref 8.9–10.3)
Chloride: 100 mmol/L (ref 98–111)
Creatinine, Ser: 1.02 mg/dL — ABNORMAL HIGH (ref 0.44–1.00)
GFR calc Af Amer: >60 mL/min (ref >60)
GFR calc non Af Amer: 59 mL/min — ABNORMAL LOW (ref >60)
Glucose, Bld: 90 mg/dL (ref 70–99)
Potassium: 4.6 mmol/L (ref 3.5–5.1)
Sodium: 133 mmol/L — ABNORMAL LOW (ref 135–145)

## 2019-12-08 LAB — FERRITIN: Ferritin: 142 ng/mL (ref 11–307)

## 2019-12-08 LAB — TYPE AND SCREEN
ABO/RH(D): A POS
Antibody Screen: NEGATIVE

## 2019-12-08 LAB — ABO/RH: ABO/RH(D): A POS

## 2019-12-08 LAB — VITAMIN B12: Vitamin B-12: 860 pg/mL (ref 180–914)

## 2019-12-08 LAB — FOLATE: Folate: 10.8 ng/mL (ref >5.9)

## 2019-12-08 MED ORDER — AMOXICILLIN-POT CLAVULANATE 875-125 MG PO TABS: 1.0000 | ORAL_TABLET | Freq: Two times a day (BID) | ORAL | 0 refills | Status: AC

## 2019-12-08 MED ORDER — FERROUS SULFATE 325 (65 FE) MG PO TBEC: 325.0000 mg | DELAYED_RELEASE_TABLET | Freq: Every day | ORAL | 0 refills | Status: DC

## 2019-12-08 MED ORDER — SODIUM CHLORIDE 0.9 % IV SOLN
510.0000 mg | Freq: Once | INTRAVENOUS | Status: AC
  Administered 2019-12-08: 510 mg via INTRAVENOUS
  Filled 2019-12-08: qty 17

## 2019-12-08 NOTE — Discharge Summary (Addendum)
Discharge Summary  [name] WCB:[id number] DOB: [birth year]  PCP: Carlena Hurl, PA-C  Admit date: 12/01/2019 Discharge date: 12/08/2019  Time spent: 40 mins   Recommendations for Outpatient Follow-up:  1. PCP in 1 week 2. Pulmonology as scheduled 3. Nephrology as scheduled 4. GI as scheduled  Discharge Diagnoses:  Active Hospital Problems   Diagnosis Date Noted  . Colitis 12/01/2019    Resolved Hospital Problems  No resolved problems to display.    Discharge Condition: Stable  Diet recommendation: Regular  Vitals:   12/08/19 0856 12/08/19 1409  BP:  (!) 98/57  Pulse:  79  Resp:  20  Temp:  98.2 F (36.8 C)  SpO2: 95% 98%    History of present illness:  [name] a 63 y.o.femalewith medical history significantfor scleroderma on remission, esophageal stricture, hypertension, chronic kidney disease stage III, asthma and anemia of chronic disease, presented to the ED on 12/4 with complaint of diarrhea, weakness. In the ED, patient was not febrile, blood pressure chronically low in 90s. Work-up showed sodium level low at 123, potassium elevated to 5.2, creatinine elevated to 1.96, albumin 3, lipase 24, WBC count 15.4, hemoglobin 9.8, platelets 728. Urinalysis with clear urine. CT scan of chest, abdomen and pelvis showed large left pleural effusion is identified with near complete atelectasis of the left lower lobe. Patient was admitted to hospitalist medicine service for further evaluation and management.    Today, patient denies any new complaints.  Noted to have a slight drop in hemoglobin, anemia panel revealed iron deficiency anemia.  Plan to receive 1 dose of Feraheme prior to discharge today, and continue daily p.o. iron supplementation.  Patient advised to follow-up with PCP in 1 week, pulmonology and nephrology as scheduled.  Home health PT/OT arranged for patient due to generalized weakness.  Hospital Course:  Active Problems:    Colitis  Possible acute viral gastroenteritis Afebrile, with no leukocytosis (improved with no antibiotics) Mild diffuse wall thickening and pericolonic soft tissue stranding involving the colon particularly at the level of the hepatic flexure. Findings compatible with colitis. Repeat CT abdomen pelvis showed nonspecific groundglass opacities within both lungs, small bilateral pleural effusions, distended esophagus filled with fluid compatible with scleroderma, proximal and mid small bowel wall thickening with focal area of mildly distended bowel, nonspecific but may represent an area of scleroderma changes or enteritis Continue to monitor off antibiotics Follow-up with GI as an outpatient  Large left pleural effusion  Acute respiratory failure with hypoxia likely 2/2 ?Aspiration pneumonia due to history of dysphagia 2/2 scleroderma Unclear etiology of pleural effusion, ??scleroderma Underwent thoracentesis twice 12/5 -1500 ml and 12/7 -500 mL of yellow fluid removed.  -Fluid analysis showed transudative pattern.   Repeat chest x-ray on 12/07/2019 showed persistent airspace consolidation over the right upper lobe likely pneumonia, small bilateral pleural effusions SLP consulted, moderate aspiration risk noted Started on IV Unasyn, switch to p.o. Augmentin for total of 5 days Advised to follow-up with Dr. Lamonte Sakai as an outpatient  Iron deficiency anemia/anemia of chronic disease Hemoglobin noted with gradual decline No evidence of significant bleeding except for mild epistaxis on 12/07/2019 Anemia panel showed iron 10, TIBC 151, sat 7, ferritin 142, folate 10.8, vitamin B12 860 Will give 1 dose of Feraheme prior to discharge and continue p.o. iron supplementation Follow-up with primary care, with repeat labs  Generalized weakness Multifactorial: Dehydration, large pleural effusion. Encourage hydration and ambulation PT evaluation appreciated.  Home health PT/OT recommended  Chronic  hypotension  Stable at SBP in the 90s Patient states that her blood pressure is always low in 90s Advised patient to speak to her nephrologist about continuing BP meds (BP meds may be for her scleroderma/Raynaud's) Last echo from June 2020 is normal  Chronic hyponatremia Stable Follow-up with PCP  Acute kidney injury Resolved S/p IVF  History of scleroderma Follows up with rheumatologist Dr. Deanne Coffer Not on any therapy for scleroderma at this time  Esophageal stricture Related to scleroderma Continue PPI.          Malnutrition Type:      Malnutrition Characteristics:      Nutrition Interventions:      Estimated body mass index is 19.33 kg/m as calculated from the following:   Height as of this encounter: 5\' 3"  (1.6 m).   Weight as of this encounter: 49.5 kg.    Procedures:  Thoracentesis  Consultations:  IR  Discharge Exam: BP (!) 98/57 (BP Location: Right Arm)   Pulse 79   Temp 98.2 F (36.8 C)   Resp 20   Ht 5\' 3"  (1.6 m)   Wt 49.5 kg   SpO2 98%   BMI 19.33 kg/m   General: NAD, thin, chronically ill-appearing Cardiovascular: S1, S2 present Respiratory: CTA B  Discharge Instructions You were cared for by a hospitalist during your hospital stay. If you have any questions about your discharge medications or the care you received while you were in the hospital after you are discharged, you can call the unit and asked to speak with the hospitalist on call if the hospitalist that took care of you is not available. Once you are discharged, your primary care physician will handle any further medical issues. Please note that NO REFILLS for any discharge medications will be authorized once you are discharged, as it is imperative that you return to your primary care physician (or establish a relationship with a primary care physician if you do not have one) for your aftercare needs so that they can reassess your need for medications and monitor your  lab values.  Discharge Instructions    Diet - low sodium heart healthy   Complete by: As directed    Increase activity slowly   Complete by: As directed      Allergies as of 12/08/2019      Reactions   Avelox [moxifloxacin Hcl In Nacl]    Caused tachycardia   Codeine Hives, Other (See Comments)   Hallucination,    Penicillins Hives   No anaphylaxis/SJS symptoms, > 10 yr ago, no hospitalization required States she has tolerated amoxicillin      Medication List    TAKE these medications   acetaminophen 500 MG tablet Commonly known as: TYLENOL Take 1,000 mg by mouth every 6 (six) hours as needed for mild pain.   albuterol 108 (90 Base) MCG/ACT inhaler Commonly known as: ProAir HFA Inhale 2 puffs into the lungs every 6 (six) hours as needed for wheezing.   albuterol (2.5 MG/3ML) 0.083% nebulizer solution Commonly known as: PROVENTIL Take 3 mLs (2.5 mg total) by nebulization every 6 (six) hours as needed for wheezing or shortness of breath.   amLODipine 5 MG tablet Commonly known as: NORVASC Take 5 mg by mouth 2 (two) times daily.   amoxicillin-clavulanate 875-125 MG tablet Commonly known as: Augmentin Take 1 tablet by mouth 2 (two) times daily for 3 days. Start taking on: December 09, 2019   Breo Ellipta 200-25 MCG/INH Aepb Generic drug: fluticasone furoate-vilanterol Inhale 1 puff  into the lungs daily.   ferrous sulfate 325 (65 FE) MG EC tablet Take 1 tablet (325 mg total) by mouth daily with breakfast.   fluticasone 50 MCG/ACT nasal spray Commonly known as: FLONASE Place 1 spray into both nostrils daily as needed (for nasal congestion). Reported on 07/16/2016   HYDROcodone-acetaminophen 5-325 MG tablet Commonly known as: Norco Take 1 tablet by mouth every 6 (six) hours as needed.   lisinopril 5 MG tablet Commonly known as: ZESTRIL Take 5 mg by mouth daily.   omeprazole 20 MG capsule Commonly known as: PRILOSEC Take 20 mg by mouth 2 (two) times daily.    pseudoephedrine 30 MG tablet Commonly known as: SUDAFED Take 30 mg by mouth every 8 (eight) hours as needed for congestion. Reported on 07/16/2016            Durable Medical Equipment  (From admission, onward)         Start     Ordered   12/07/19 1547  For home use only DME 4 wheeled rolling walker with seat  Once    Question:  Patient needs a walker to treat with the following condition  Answer:  Fear for personal safety   12/07/19 1546         Allergies  Allergen Reactions  . Avelox [Moxifloxacin Hcl In Nacl]     Caused tachycardia  . Codeine Hives and Other (See Comments)    Hallucination,   . Penicillins Hives    No anaphylaxis/SJS symptoms, > 10 yr ago, no hospitalization required States she has tolerated amoxicillin    Follow-up Information    [facility] Follow up.   Specialty: Home Health Services Why: Will call you 48 hours after discharge. Please feel free to call them.  Contact information: [address] [place] [address] [phone]        Tysinger, Kermit Balo, PA-C. Schedule an appointment as soon as possible for a visit in 1 week(s).   Specialty: Family Medicine Contact information: [address] [place] [address] [phone]            The results of significant diagnostics from this hospitalization (including imaging, microbiology, ancillary and laboratory) are listed below for reference.    Significant Diagnostic Studies: CT ABDOMEN PELVIS WO CONTRAST  Result Date: 12/05/2019 CLINICAL DATA:  63 year old female with abdominal and pelvic pain. Pleural effusion. History of scleroderma. EXAM: CT CHEST, ABDOMEN AND PELVIS WITHOUT CONTRAST TECHNIQUE: Multidetector CT imaging of the chest, abdomen and pelvis was performed following the standard protocol without IV contrast. COMPARISON:  12/01/2019 CT and prior studies FINDINGS: CT CHEST FINDINGS Cardiovascular: Mild cardiomegaly, and mild coronary artery and  aortic atherosclerotic calcifications again noted. A trace pericardial effusion is again noted. Mediastinum/Nodes: Distended esophagus filled with fluid is again noted. No enlarged lymph nodes. Lungs/Pleura: Increasing ground-glass opacities within both lungs noted, greatest in the RIGHT UPPER lobe. Small bilateral pleural effusions are noted, increased on the RIGHT and decreased on the LEFT. Mild bibasilar atelectasis noted. No discrete mass or pneumothorax. Musculoskeletal: No acute or suspicious bony abnormalities. CT ABDOMEN PELVIS FINDINGS Please note that parenchymal abnormalities may be missed without intravenous contrast. Hepatobiliary: No hepatic or definite gallbladder abnormalities noted. No biliary dilatation. Pancreas: Unremarkable Spleen: Unremarkable Adrenals/Urinary Tract: Punctate nonobstructing bilateral renal calculi and a small LEFT renal cyst are again noted. No hydronephrosis or obstructing urinary calculi. The adrenal glands and bladder are unremarkable. Stomach/Bowel: Proximal and mid small bowel wall thickening identified. More  focally area of mildly distended bowel with probable wall thickening noted within the central pelvis) series 2: Images 96-101). Mild circumferential wall thickening of the colon at the splenic flexure is again identified, dating back to 2017. No other bowel abnormalities are noted. Vascular/Lymphatic: Aortic atherosclerosis. No enlarged abdominal or pelvic lymph nodes. Reproductive: Uterus and bilateral adnexa are unremarkable. Other: A trace amount of ascites is noted within the pelvis. No evidence of pneumoperitoneum or definite focal collection. Musculoskeletal: No acute or suspicious bony abnormalities identified. IMPRESSION: 1. Nonspecific increasing ground-glass opacities within both lungs, greatest in the RIGHT UPPER lobe. This may represent an infectious or inflammatory process. 2. Small bilateral pleural effusions, increased on the RIGHT and decreased on the  LEFT. Trace amount of ascites. 3. Distended esophagus filled with fluid compatible with scleroderma. 4. Proximal and mid small bowel wall thickening with focal area of mildly distended bowel with probable wall thickening within the central pelvis - nonspecific but may represent an area of scleroderma changes or other enteritis. 5. Aortic Atherosclerosis (ICD10-I70.0). Electronically Signed   By: Harmon Pier [name]   On: 12/05/2019 18:12   CT Abdomen Pelvis Wo Contrast  Result Date: 12/01/2019 CLINICAL DATA:  Abdominal pain and diarrhea. Pleural effusion. History of scleroderma EXAM: CT CHEST, ABDOMEN AND PELVIS WITHOUT CONTRAST TECHNIQUE: Multidetector CT imaging of the chest, abdomen and pelvis was performed following the standard protocol without IV contrast. COMPARISON:  CT chest 06/15/2019 and CT AP 06/24/2016. FINDINGS: CT CHEST FINDINGS Cardiovascular: The heart size is within normal limits. No pericardial effusion identified. Aortic atherosclerosis. Mediastinum/Nodes: Normal appearance of the thyroid gland. The trachea appears patent and is midline. Thin walled dilated esophagus with air-fluid level is again noted compatible with the clinical history of scleroderma. No enlarged axillary, supraclavicular, or mediastinal adenopathy. Lungs/Pleura: There is a large left pleural effusion, new compared with previous exam. Associated near complete atelectasis of the left lower lobe is identified. Loculated fluid extending along the oblique fissure is noted. Within the right lung there are multifocal areas of interlobular septal thickening and ground-glass attenuation involving the right lower lobe, right middle lobe, and peripheral right upper lobe. Musculoskeletal: No chest wall mass or suspicious bone lesions identified. CT ABDOMEN PELVIS FINDINGS Hepatobiliary: Hepatic steatosis. No focal liver abnormality is seen. No gallstones, gallbladder wall thickening, or biliary dilatation. Pancreas: Unremarkable. No  pancreatic ductal dilatation or surrounding inflammatory changes. Spleen: Normal in size without focal abnormality. Adrenals/Urinary Tract: Normal adrenal glands. Cyst within upper pole of the left kidney is incompletely characterized without IV contrast measuring 1.1 cm, 65/4. No mass or hydronephrosis identified bilaterally. The urinary bladder is normal. Stomach/Bowel: Stomach is normal. Small bowel loops are unremarkable. Mild diffuse wall thickening involving the colon is noted particularly at the level of the hepatic flexure. At the a patent flexure there is mild pericolonic soft tissue stranding noted as well. No pneumatosis. Vascular/Lymphatic: Aortic atherosclerosis. No abdominopelvic adenopathy. Reproductive: Uterus and bilateral adnexa are unremarkable. Other: No free fluid or fluid collections. Musculoskeletal: No acute or significant osseous findings. IMPRESSION: 1. Large left pleural effusion is identified with near complete atelectasis of the left lower lobe. Etiology is indeterminate. New when compared with 06/15/2019. Consider further evaluation with diagnostic/therapeutic thoracentesis. 2. There are multifocal areas of interlobular septal thickening and ground-glass attenuation within the right lung which may reflect an inflammatory or infectious process. 3. Mild diffuse wall thickening and pericolonic soft tissue stranding involving the colon particularly at the level of the hepatic flexure. Findings compatible with  colitis. 4. Aortic atherosclerosis. 5. Thin walled dilated esophagus with air-fluid level compatible with the clinical history of scleroderma. 6. Hepatic steatosis. Aortic Atherosclerosis (ICD10-I70.0). Electronically Signed   By: Signa Kell [name]   On: 12/01/2019 19:26   DG Chest 1 View  Result Date: 12/04/2019 CLINICAL DATA:  Left-sided thoracentesis. EXAM: CHEST  1 VIEW COMPARISON:  Chest x-ray 11/03/2019. FINDINGS: No evidence of pneumothorax post thoracentesis. Multifocal  pulmonary infiltrates particularly on the right again noted. Small right pleural effusion noted. No pneumothorax. Heart size normal. No acute bony abnormality. IMPRESSION: No evidence of pneumothorax post thoracentesis. Electronically Signed   By: Maisie Fus  Register   On: 12/04/2019 12:43   DG Chest 1 View  Result Date: 12/02/2019 CLINICAL DATA:  Post LEFT thoracentesis EXAM: CHEST  1 VIEW COMPARISON:  Portable exam 1242 hours compared to CT chest of 12/01/2019 FINDINGS: Upper normal heart size. Atherosclerotic calcification aorta. Bibasilar effusions and minimal atelectasis. Persistent opacities identified in RIGHT upper and RIGHT lower lobes. No pneumothorax following thoracentesis. Underlying emphysematous changes and osseous demineralization. IMPRESSION: No pneumothorax following LEFT thoracentesis. Emphysematous changes with persistent infiltrates at RIGHT upper lobe and RIGHT lower lobes. Electronically Signed   By: Ulyses Southward [name]   On: 12/02/2019 12:52   CT CHEST WO CONTRAST  Result Date: 12/05/2019 CLINICAL DATA:  63 year old female with abdominal and pelvic pain. Pleural effusion. History of scleroderma. EXAM: CT CHEST, ABDOMEN AND PELVIS WITHOUT CONTRAST TECHNIQUE: Multidetector CT imaging of the chest, abdomen and pelvis was performed following the standard protocol without IV contrast. COMPARISON:  12/01/2019 CT and prior studies FINDINGS: CT CHEST FINDINGS Cardiovascular: Mild cardiomegaly, and mild coronary artery and aortic atherosclerotic calcifications again noted. A trace pericardial effusion is again noted. Mediastinum/Nodes: Distended esophagus filled with fluid is again noted. No enlarged lymph nodes. Lungs/Pleura: Increasing ground-glass opacities within both lungs noted, greatest in the RIGHT UPPER lobe. Small bilateral pleural effusions are noted, increased on the RIGHT and decreased on the LEFT. Mild bibasilar atelectasis noted. No discrete mass or pneumothorax. Musculoskeletal: No  acute or suspicious bony abnormalities. CT ABDOMEN PELVIS FINDINGS Please note that parenchymal abnormalities may be missed without intravenous contrast. Hepatobiliary: No hepatic or definite gallbladder abnormalities noted. No biliary dilatation. Pancreas: Unremarkable Spleen: Unremarkable Adrenals/Urinary Tract: Punctate nonobstructing bilateral renal calculi and a small LEFT renal cyst are again noted. No hydronephrosis or obstructing urinary calculi. The adrenal glands and bladder are unremarkable. Stomach/Bowel: Proximal and mid small bowel wall thickening identified. More focally area of mildly distended bowel with probable wall thickening noted within the central pelvis) series 2: Images 96-101). Mild circumferential wall thickening of the colon at the splenic flexure is again identified, dating back to 2017. No other bowel abnormalities are noted. Vascular/Lymphatic: Aortic atherosclerosis. No enlarged abdominal or pelvic lymph nodes. Reproductive: Uterus and bilateral adnexa are unremarkable. Other: A trace amount of ascites is noted within the pelvis. No evidence of pneumoperitoneum or definite focal collection. Musculoskeletal: No acute or suspicious bony abnormalities identified. IMPRESSION: 1. Nonspecific increasing ground-glass opacities within both lungs, greatest in the RIGHT UPPER lobe. This may represent an infectious or inflammatory process. 2. Small bilateral pleural effusions, increased on the RIGHT and decreased on the LEFT. Trace amount of ascites. 3. Distended esophagus filled with fluid compatible with scleroderma. 4. Proximal and mid small bowel wall thickening with focal area of mildly distended bowel with probable wall thickening within the central pelvis - nonspecific but may represent an area of scleroderma changes or other enteritis. 5. Aortic  Atherosclerosis (ICD10-I70.0). Electronically Signed   By: Harmon PierJeffrey  Hu [name]   On: 12/05/2019 18:12   CT Chest Wo Contrast  Result Date:  12/01/2019 CLINICAL DATA:  Abdominal pain and diarrhea. Pleural effusion. History of scleroderma EXAM: CT CHEST, ABDOMEN AND PELVIS WITHOUT CONTRAST TECHNIQUE: Multidetector CT imaging of the chest, abdomen and pelvis was performed following the standard protocol without IV contrast. COMPARISON:  CT chest 06/15/2019 and CT AP 06/24/2016. FINDINGS: CT CHEST FINDINGS Cardiovascular: The heart size is within normal limits. No pericardial effusion identified. Aortic atherosclerosis. Mediastinum/Nodes: Normal appearance of the thyroid gland. The trachea appears patent and is midline. Thin walled dilated esophagus with air-fluid level is again noted compatible with the clinical history of scleroderma. No enlarged axillary, supraclavicular, or mediastinal adenopathy. Lungs/Pleura: There is a large left pleural effusion, new compared with previous exam. Associated near complete atelectasis of the left lower lobe is identified. Loculated fluid extending along the oblique fissure is noted. Within the right lung there are multifocal areas of interlobular septal thickening and ground-glass attenuation involving the right lower lobe, right middle lobe, and peripheral right upper lobe. Musculoskeletal: No chest wall mass or suspicious bone lesions identified. CT ABDOMEN PELVIS FINDINGS Hepatobiliary: Hepatic steatosis. No focal liver abnormality is seen. No gallstones, gallbladder wall thickening, or biliary dilatation. Pancreas: Unremarkable. No pancreatic ductal dilatation or surrounding inflammatory changes. Spleen: Normal in size without focal abnormality. Adrenals/Urinary Tract: Normal adrenal glands. Cyst within upper pole of the left kidney is incompletely characterized without IV contrast measuring 1.1 cm, 65/4. No mass or hydronephrosis identified bilaterally. The urinary bladder is normal. Stomach/Bowel: Stomach is normal. Small bowel loops are unremarkable. Mild diffuse wall thickening involving the colon is noted  particularly at the level of the hepatic flexure. At the a patent flexure there is mild pericolonic soft tissue stranding noted as well. No pneumatosis. Vascular/Lymphatic: Aortic atherosclerosis. No abdominopelvic adenopathy. Reproductive: Uterus and bilateral adnexa are unremarkable. Other: No free fluid or fluid collections. Musculoskeletal: No acute or significant osseous findings. IMPRESSION: 1. Large left pleural effusion is identified with near complete atelectasis of the left lower lobe. Etiology is indeterminate. New when compared with 06/15/2019. Consider further evaluation with diagnostic/therapeutic thoracentesis. 2. There are multifocal areas of interlobular septal thickening and ground-glass attenuation within the right lung which may reflect an inflammatory or infectious process. 3. Mild diffuse wall thickening and pericolonic soft tissue stranding involving the colon particularly at the level of the hepatic flexure. Findings compatible with colitis. 4. Aortic atherosclerosis. 5. Thin walled dilated esophagus with air-fluid level compatible with the clinical history of scleroderma. 6. Hepatic steatosis. Aortic Atherosclerosis (ICD10-I70.0). Electronically Signed   By: Signa Kellaylor  Stroud [name]   On: 12/01/2019 19:26   DG Chest Port 1 View  Result Date: 12/07/2019 CLINICAL DATA:  Dyspnea. EXAM: PORTABLE CHEST 1 VIEW COMPARISON:  12/04/2019 FINDINGS: Lungs are adequately inflated demonstrate persistent consolidation over the right upper lobe without significant change. Mild hazy bibasilar opacification as well as mild hazy prominence of the perihilar vessels unchanged. Small bilateral pleural effusions unchanged. Borderline stable cardiomegaly. Remainder of the exam is unchanged. IMPRESSION: 1. Stable airspace consolidation over the right upper lobe likely pneumonia. Stable bibasilar opacification which may be due to infection or atelectasis. Stable small bilateral pleural effusions. 2. Borderline stable  cardiomegaly with suggestion of minimal vascular congestion. Electronically Signed   By: Elberta Fortisaniel  Boyle [name]   On: 12/07/2019 07:48   DG CHEST PORT 1 VIEW  Result Date: 12/03/2019 CLINICAL DATA:  Shortness of  breath. EXAM: PORTABLE CHEST 1 VIEW COMPARISON:  12/02/2019 FINDINGS: Stable cardiomegaly. Airspace disease in right upper lobe and right lung base shows no significant change. Mild increase in airspace opacity is seen in the left lung base. Probable small left pleural effusion noted. IMPRESSION: 1. No significant change in right upper and lower lobe airspace disease. 2. Mild increase in left lower lobe airspace disease. Probable small left pleural effusion. Electronically Signed   By: Danae Orleans [name]   On: 12/03/2019 06:48   US THORACENTESIS ASP PLEURAL SPACE W/IMG GUIDE  Result Date: 12/04/2019 INDICATION: History of diarrhea/colitis, scleroderma, acute on chronic kidney disease, dyspnea, large left pleural effusion. Request for therapeutic left thoracentesis. EXAM: ULTRASOUND GUIDED LEFT THORACENTESIS MEDICATIONS: 1% lidocaine 8 mL COMPLICATIONS: None immediate. PROCEDURE: An ultrasound guided thoracentesis was thoroughly discussed with the patient and questions answered. The benefits, risks, alternatives and complications were also discussed. The patient understands and wishes to proceed with the procedure. Written consent was obtained. Ultrasound was performed to localize and mark an adequate pocket of fluid in the left chest. The area was then prepped and draped in the normal sterile fashion. 1% Lidocaine was used for local anesthesia. Under ultrasound guidance a 6 Fr Safe-T-Centesis catheter was introduced. Thoracentesis was performed. The catheter was removed and a dressing applied. FINDINGS: A total of approximately 550 mL of clear yellow fluid was removed. IMPRESSION: Successful ultrasound guided left thoracentesis yielding 550 mL of pleural fluid. No pneumothorax on post-procedure chest  x-ray. Read by: Corrin Parker, PA-C Electronically Signed   By: Simonne Come [name]   On: 12/04/2019 13:07   US THORACENTESIS ASP PLEURAL SPACE W/IMG GUIDE  Result Date: 12/02/2019 INDICATION: Patient with history of diarrhea/colitis, scleroderma, acute on chronic kidney disease, dyspnea, large left pleural effusion. Request made for diagnostic and therapeutic left thoracentesis. EXAM: ULTRASOUND GUIDED DIAGNOSTIC AND THERAPEUTIC LEFT THORACENTESIS MEDICATIONS: None COMPLICATIONS: None immediate. PROCEDURE: An ultrasound guided thoracentesis was thoroughly discussed with the patient and questions answered. The benefits, risks, alternatives and complications were also discussed. The patient understands and wishes to proceed with the procedure. Written consent was obtained. Ultrasound was performed to localize and mark an adequate pocket of fluid in the left chest. The area was then prepped and draped in the normal sterile fashion. 1% Lidocaine was used for local anesthesia. Under ultrasound guidance a 6 Fr Safe-T-Centesis catheter was introduced. Thoracentesis was performed. The catheter was removed and a dressing applied. FINDINGS: A total of approximately 1.5 liters of slightly hazy, yellow fluid was removed. Samples were sent to the laboratory as requested by the clinical team. Due to patient's hypotension, this being her initial thoracentesis and acute on chronic kidney disease only the above amount of fluid was removed today. IMPRESSION: Successful ultrasound guided diagnostic and therapeutic left thoracentesis yielding 1.5 liters of pleural fluid. Read by: Jeananne Rama, PA-C Electronically Signed   By: Corlis Leak [name]   On: 12/02/2019 12:45    Microbiology: Recent Results (from the past 240 hour(s))  SARS CORONAVIRUS 2 (TAT 6-24 HRS) Nasopharyngeal Nasopharyngeal Swab     Status: None   Collection Time: 12/01/19  6:15 PM   Specimen: Nasopharyngeal Swab  Result Value Ref Range Status   SARS Coronavirus 2  NEGATIVE NEGATIVE Final    Comment: (NOTE) SARS-CoV-2 target nucleic acids are NOT DETECTED. The SARS-CoV-2 RNA is generally detectable in upper and lower respiratory specimens during the acute phase of infection. Negative results do not preclude SARS-CoV-2 infection, do not rule out co-infections  with other pathogens, and should not be used as the sole basis for treatment or other patient management decisions. Negative results must be combined with clinical observations, patient history, and epidemiological information. The expected result is Negative. Fact Sheet for Patients: HairSlick.no Fact Sheet for Healthcare Providers: quierodirigir.com This test is not yet approved or cleared by the [place] FDA and  has been authorized for detection and/or diagnosis of SARS-CoV-2 by FDA under an Emergency Use Authorization (EUA). This EUA will remain  in effect (meaning this test can be used) for the duration of the COVID-19 declaration under Section 56 4(b)(1) of the Act, 21 U.S.C. section 360bbb-3(b)(1), unless the authorization is terminated or revoked sooner. Performed at [facility], [address] [place], [place] [address]   GI pathogen panel by PCR, stool     Status: None   Collection Time: 12/01/19  6:15 PM  Result Value Ref Range Status   Plesiomonas shigelloides NOT DETECTED NOT DETECTED Final   Yersinia enterocolitica NOT DETECTED NOT DETECTED Final   Vibrio NOT DETECTED NOT DETECTED Final   Enteropathogenic E coli NOT DETECTED NOT DETECTED Final   E coli (ETEC) LT/ST NOT DETECTED NOT DETECTED Final   E coli 0157 by PCR Not applicable NOT DETECTED Final   Cryptosporidium by PCR NOT DETECTED NOT DETECTED Final   Entamoeba histolytica NOT DETECTED NOT DETECTED Final   Adenovirus F 40/41 NOT DETECTED NOT DETECTED Final   Norovirus GI/GII NOT DETECTED NOT DETECTED Final   Sapovirus NOT DETECTED NOT DETECTED Final     Comment: (NOTE) Performed At: [facility] [address] [place], [place] [id number] Jolene Schimke MD AO:[id number]    Vibrio cholerae NOT DETECTED NOT DETECTED Final   Campylobacter by PCR NOT DETECTED NOT DETECTED Final   Salmonella by PCR NOT DETECTED NOT DETECTED Final   E coli (STEC) NOT DETECTED NOT DETECTED Final   Enteroaggregative E coli NOT DETECTED NOT DETECTED Final   Shigella by PCR NOT DETECTED NOT DETECTED Final   Cyclospora cayetanensis NOT DETECTED NOT DETECTED Final   Astrovirus NOT DETECTED NOT DETECTED Final   G lamblia by PCR NOT DETECTED NOT DETECTED Final   Rotavirus A by PCR NOT DETECTED NOT DETECTED Final  Culture, body fluid-bottle     Status: None   Collection Time: 12/02/19  4:57 PM   Specimen: Fluid  Result Value Ref Range Status   Specimen Description FLUID  Final   Special Requests NONE  Final   Culture   Final    NO GROWTH 5 DAYS Performed at [facility], [address] [place], [place] [address]    Report Status 12/07/2019 FINAL  Final  Gram stain     Status: None   Collection Time: 12/02/19  4:57 PM   Specimen: Fluid  Result Value Ref Range Status   Specimen Description FLUID  Final   Special Requests NONE  Final   Gram Stain   Final    CYTOSPIN SMEAR WBC PRESENT,BOTH PMN AND MONONUCLEAR NO ORGANISMS SEEN Performed at [facility], [address], [place], [place] [address]    Report Status 12/03/2019 FINAL  Final     Labs: Basic Metabolic Panel: Recent Labs  Lab 12/02/19 0758 12/03/19 0503 12/03/19 0932 12/04/19 0350 12/05/19 0344 12/08/19 0414  NA 130* 131*  --  130* 131* 133*  K 5.3* 5.5*  --  5.1 4.5 4.6  CL 97* 100  --  100 102 100  CO2 16* 20*  --  16* 21* 25  GLUCOSE 59* 94  --  97 95 90  BUN 18 16  --  CREATININE 1.57* 1.55*  --  1.26* 0.99 1.02*  CALCIUM 7.9* 7.9*  --  7.7* 7.5* 7.6*  MG  --   --  1.7  --   --   --   PHOS  --   --  <1.0* 5.6* 2.5  --    Liver Function  Tests: Recent Labs  Lab 12/02/19 0758 12/03/19 0503 12/04/19 0350 12/05/19 0344  AST 16 16 14* 11*  ALT ALKPHOS 64 62 60 49  BILITOT 0.8 1.0 0.8 0.9  PROT 6.0* 5.4* 5.1* 5.0*  ALBUMIN 2.4* 2.1* 1.8* 1.9*   No results for input(s): LIPASE, AMYLASE in the last 168 hours. No results for input(s): AMMONIA in the last 168 hours. CBC: Recent Labs  Lab 12/02/19 0758 12/03/19 0503 12/04/19 0350 12/05/19 0344 12/08/19 0414  WBC 19.6* 17.3* 11.6* 7.7 4.9  NEUTROABS 17.0* 14.6* 9.1* 5.2 2.3  HGB 9.1* 9.0* 8.4* 8.1* 7.5*  HCT 29.8* 28.3* 26.5* 25.1* 23.7*  MCV 94.3 91.6 90.1 89.3 88.1  PLT 694* 719* 629* 574* 522*   Cardiac Enzymes: No results for input(s): CKTOTAL, CKMB, CKMBINDEX, TROPONINI in the last 168 hours. BNP: BNP (last 3 results) Recent Labs    12/01/19 1841  BNP 137.0*    ProBNP (last 3 results) No results for input(s): PROBNP in the last 8760 hours.  CBG: No results for input(s): GLUCAP in the last 168 hours.     Signed:  Briant Cedar, MD [facility] 12/08/2019, 5:27 PM

## 2019-12-08 NOTE — Plan of Care (Signed)

## 2019-12-08 NOTE — Progress Notes (Signed)
[name] to be D/C'd Home per MD order.  Discussed prescriptions and follow up appointments with the patient. Prescriptions given to patient, medication list explained in detail. Pt verbalized understanding.  Allergies as of 12/08/2019      Reactions   Avelox [moxifloxacin Hcl In Nacl]    Caused tachycardia   Codeine Hives, Other (See Comments)   Hallucination,    Penicillins Hives   No anaphylaxis/SJS symptoms, > 10 yr ago, no hospitalization required States she has tolerated amoxicillin      Medication List    TAKE these medications   acetaminophen 500 MG tablet Commonly known as: TYLENOL Take 1,000 mg by mouth every 6 (six) hours as needed for mild pain.   albuterol 108 (90 Base) MCG/ACT inhaler Commonly known as: ProAir HFA Inhale 2 puffs into the lungs every 6 (six) hours as needed for wheezing.   albuterol (2.5 MG/3ML) 0.083% nebulizer solution Commonly known as: PROVENTIL Take 3 mLs (2.5 mg total) by nebulization every 6 (six) hours as needed for wheezing or shortness of breath.   amLODipine 5 MG tablet Commonly known as: NORVASC Take 5 mg by mouth 2 (two) times daily.   amoxicillin-clavulanate 875-125 MG tablet Commonly known as: Augmentin Take 1 tablet by mouth 2 (two) times daily for 3 days. Start taking on: December 09, 2019   Breo Ellipta 200-25 MCG/INH Aepb Generic drug: fluticasone furoate-vilanterol Inhale 1 puff into the lungs daily.   ferrous sulfate 325 (65 FE) MG EC tablet Take 1 tablet (325 mg total) by mouth daily with breakfast.   fluticasone 50 MCG/ACT nasal spray Commonly known as: FLONASE Place 1 spray into both nostrils daily as needed (for nasal congestion). Reported on 07/16/2016   HYDROcodone-acetaminophen 5-325 MG tablet Commonly known as: Norco Take 1 tablet by mouth every 6 (six) hours as needed.   lisinopril 5 MG tablet Commonly known as: ZESTRIL Take 5 mg by mouth daily.   omeprazole 20 MG capsule Commonly known as:  PRILOSEC Take 20 mg by mouth 2 (two) times daily.   pseudoephedrine 30 MG tablet Commonly known as: SUDAFED Take 30 mg by mouth every 8 (eight) hours as needed for congestion. Reported on 07/16/2016            Durable Medical Equipment  (From admission, onward)         Start     Ordered   12/07/19 1547  For home use only DME 4 wheeled rolling walker with seat  Once    Question:  Patient needs a walker to treat with the following condition  Answer:  Fear for personal safety   12/07/19 1546          Vitals:   12/08/19 0856 12/08/19 1409  BP:  (!) 98/57  Pulse:  79  Resp:  20  Temp:  98.2 F (36.8 C)  SpO2: 95% 98%    Skin clean, dry and intact without evidence of skin break down, no evidence of skin tears noted. IV catheter discontinued intact. Site without signs and symptoms of complications. Dressing and pressure applied. Pt denies pain at this time. No complaints noted.  An After Visit Summary was printed and given to the patient. Patient escorted via [place], and D/C home via private auto.  Nonie Hoyer S 12/08/2019 6:00 PM

## 2019-12-11 ENCOUNTER — Telehealth: Payer: Self-pay

## 2019-12-11 DIAGNOSIS — J90 Pleural effusion, not elsewhere classified: Secondary | ICD-10-CM | POA: Diagnosis not present

## 2019-12-11 DIAGNOSIS — L940 Localized scleroderma [morphea]: Secondary | ICD-10-CM | POA: Diagnosis not present

## 2019-12-11 DIAGNOSIS — E871 Hypo-osmolality and hyponatremia: Secondary | ICD-10-CM | POA: Diagnosis not present

## 2019-12-11 DIAGNOSIS — I7300 Raynaud's syndrome without gangrene: Secondary | ICD-10-CM | POA: Diagnosis not present

## 2019-12-11 DIAGNOSIS — R1310 Dysphagia, unspecified: Secondary | ICD-10-CM | POA: Diagnosis not present

## 2019-12-11 DIAGNOSIS — K222 Esophageal obstruction: Secondary | ICD-10-CM | POA: Diagnosis not present

## 2019-12-11 DIAGNOSIS — E860 Dehydration: Secondary | ICD-10-CM | POA: Diagnosis not present

## 2019-12-11 DIAGNOSIS — I959 Hypotension, unspecified: Secondary | ICD-10-CM | POA: Diagnosis not present

## 2019-12-11 DIAGNOSIS — K529 Noninfective gastroenteritis and colitis, unspecified: Secondary | ICD-10-CM | POA: Diagnosis not present

## 2019-12-11 NOTE — Telephone Encounter (Signed)
I called the pt. Per my [facility] TOC report to go over her meds and get her scheduled for a hospital f/u apt. Pt. meds were gone over and reconciled, pt. Is aware of new medications, pt. Said she will call back to schedule hospital f/u, she needs to see when her daughter can bring her.

## 2019-12-12 NOTE — Telephone Encounter (Signed)
Per Juliann Pulse pt. Has been scheduled on Thursday for her hospital f/u.

## 2019-12-14 ENCOUNTER — Encounter: Payer: Self-pay | Admitting: Medical

## 2019-12-14 ENCOUNTER — Telehealth: Payer: Self-pay | Admitting: Medical

## 2019-12-14 ENCOUNTER — Ambulatory Visit (INDEPENDENT_AMBULATORY_CARE_PROVIDER_SITE_OTHER): Payer: Medicare PPO | Admitting: Medical

## 2019-12-14 ENCOUNTER — Other Ambulatory Visit: Payer: Self-pay

## 2019-12-14 VITALS — BP 90/62 | HR 76 | Temp 98.1°F | Ht 63.0 in | Wt 102.6 lb

## 2019-12-14 DIAGNOSIS — Z7185 Encounter for immunization safety counseling: Secondary | ICD-10-CM

## 2019-12-14 DIAGNOSIS — I129 Hypertensive chronic kidney disease with stage 1 through stage 4 chronic kidney disease, or unspecified chronic kidney disease: Secondary | ICD-10-CM

## 2019-12-14 DIAGNOSIS — Z7952 Long term (current) use of systemic steroids: Secondary | ICD-10-CM | POA: Diagnosis not present

## 2019-12-14 DIAGNOSIS — N1832 Chronic kidney disease, stage 3b: Secondary | ICD-10-CM | POA: Diagnosis not present

## 2019-12-14 DIAGNOSIS — D631 Anemia in chronic kidney disease: Secondary | ICD-10-CM

## 2019-12-14 DIAGNOSIS — J45909 Unspecified asthma, uncomplicated: Secondary | ICD-10-CM | POA: Diagnosis not present

## 2019-12-14 DIAGNOSIS — I7090 Unspecified atherosclerosis: Secondary | ICD-10-CM | POA: Diagnosis not present

## 2019-12-14 DIAGNOSIS — N189 Chronic kidney disease, unspecified: Secondary | ICD-10-CM | POA: Diagnosis not present

## 2019-12-14 DIAGNOSIS — I7300 Raynaud's syndrome without gangrene: Secondary | ICD-10-CM | POA: Diagnosis not present

## 2019-12-14 DIAGNOSIS — Z7189 Other specified counseling: Secondary | ICD-10-CM

## 2019-12-14 DIAGNOSIS — M349 Systemic sclerosis, unspecified: Secondary | ICD-10-CM

## 2019-12-14 DIAGNOSIS — K529 Noninfective gastroenteritis and colitis, unspecified: Secondary | ICD-10-CM | POA: Diagnosis not present

## 2019-12-14 DIAGNOSIS — N183 Chronic kidney disease, stage 3 unspecified: Secondary | ICD-10-CM

## 2019-12-14 DIAGNOSIS — R195 Other fecal abnormalities: Secondary | ICD-10-CM | POA: Insufficient documentation

## 2019-12-14 DIAGNOSIS — R531 Weakness: Secondary | ICD-10-CM

## 2019-12-14 MED ORDER — ALBUTEROL SULFATE HFA 108 (90 BASE) MCG/ACT IN AERS: 2.0000 | INHALATION_SPRAY | Freq: Four times a day (QID) | RESPIRATORY_TRACT | 1 refills | Status: DC | PRN

## 2019-12-14 MED ORDER — BREO ELLIPTA 200-25 MCG/INH IN AEPB: 1.0000 | INHALATION_SPRAY | Freq: Every day | RESPIRATORY_TRACT | 3 refills | Status: DC

## 2019-12-14 NOTE — Telephone Encounter (Signed)
As a follow-up from Thursday's visit, here are my recommendations:  I recommend a social work referral/[facility] referral to help with the following and coordination:  Please help make sure she has [facility] follow up within the next 1-2 weeks.   Have her continue plan for pulmonology consult in 01/2020 Call and make sure [name] has follow up appt for her as well  I want to see her back in 6 to 8 weeks for well visit and recheck   In the meantime, have her check insurance coverage for shingles vaccine, Prevnar 13 vaccine, tetanus vaccine as she is due for all of these  She is due for mammogram including left screening, right diagnostic  I have no clue when her last Pap smear was.  She needs this updated either here or with gynecology  Make sure we send copy of today's note to [facility]

## 2019-12-14 NOTE — Progress Notes (Signed)
Subjective: Chief Complaint  Patient presents with  . Follow-up   Here for hospital follow-up.  I saw her last on December 3 for diarrhea, abdominal pain and suspected colitis.  We ultimately got her in with gastroenterology who referred her to the emergency department the same day after we have tried to coordinate with gastroenterology that same morning.  She was admitted from December 4 through December 08, 2019 for colitis.  She has a history of scleroderma, chronic kidney disease stage III, asthma, anemia of chronic disease, hypertension.  She presented originally with diarrhea, weakness, abdominal pain, abnormalities of labs from her visit here.  She had a CT scan showing large left pleural effusion and near complete atelectasis of left lower lobe of lungs,.  She did not receive a dose of Feraheme due to low iron and a drop in hemoglobin.  She was started on iron supplementation.  She was advised to have follow-up at discharge with pulmonology, nephrology, and gastroenterology.  Today she feels much improved.  Finished antibiotic.  Physical therapy and occupational therapy has already come up to the house recently and their second visit is tomorrow.  She still has some loose stool and wants to know what would be okay to take for this.  She also still feels a little stuffy in her chest despite use of inhaler.  Wonders about whether she would benefit from steroid use.  She is compliant with Breo and albuterol  Past Medical History:  Diagnosis Date  . Allergic rhinitis   . Asthma   . Atherosclerosis   . Chronic kidney disease (CKD), stage III (moderate)   . Esophageal stricture   . GERD (gastroesophageal reflux disease)   . History of anemia due to chronic kidney disease   . History of colitis 2017  . Hypertension   . Metabolic bone disease   . Raynaud disease   . Scleroderma ([place])    Current Outpatient Medications on File Prior to Visit  Medication Sig Dispense Refill  .  acetaminophen (TYLENOL) 500 MG tablet Take 1,000 mg by mouth every 6 (six) hours as needed for mild pain.    [name] albuterol (PROVENTIL) (2.5 MG/3ML) 0.083% nebulizer solution Take 3 mLs (2.5 mg total) by nebulization every 6 (six) hours as needed for wheezing or shortness of breath. 75 mL 1  . amLODipine (NORVASC) 5 MG tablet Take 5 mg by mouth 2 (two) times daily.     . ferrous sulfate 325 (65 FE) MG EC tablet Take 1 tablet (325 mg total) by mouth daily with breakfast. 30 tablet 0  . [DISCONTINUED] famotidine (PEPCID) 20 MG tablet Take 20 mg by mouth 2 (two) times daily.       No current facility-administered medications on file prior to visit.   Past Surgical History:  Procedure Laterality Date  . CESAREAN SECTION    . COLONOSCOPY WITH PROPOFOL N/A 07/14/2016   Procedure: COLONOSCOPY WITH PROPOFOL;  Surgeon: Milus Banister, MD;  Location: [facility];  Service: Endoscopy;  Laterality: N/A;  . HAND SURGERY     left; tendon repair  . TONSILLECTOMY       ROS as in subjective   Objective: BP 90/62   Pulse 76   Temp 98.1 F (36.7 C)   Ht 5\' 3"  (1.6 m)   Wt 102 lb 9.6 oz (46.5 kg)   SpO2 97%   BMI 18.17 kg/m   Wt Readings from Last 3 Encounters:  12/14/19 102 lb 9.6 oz (46.5 kg)  12/04/19  109 lb 2 oz (49.5 kg)  12/01/19 111 lb 9.6 oz (50.6 kg)   BP Readings from Last 3 Encounters:  12/14/19 90/62  12/08/19 (!) 98/57  12/01/19 112/68   General: Well-built well-nourished no acute distress Lungs: Somewhat decreased lung sounds in general, particular left lower fields, no wheezing no rhonchi no rales Heart regular rate and rhythm, normal S1-S2 no murmurs No lower extremity edema Pulses 1+ pedal pulses 2+ upper Abdomen nontender today no mass no organomegaly     Assessment: Encounter Diagnoses  Name Primary?  . Colitis Yes  . CRI (chronic renal insufficiency), stage 3 (moderate)   . Hypertensive nephropathy   . Anemia of renal disease   . Scleroderma (HCC)   . Long  term systemic steroid user   . Intrinsic asthma   . Atherosclerosis   . Raynaud's disease without gangrene   . Weakness   . Vaccine counseling   . Loose stools      Plan: I reviewed her hospital report, her imaging from the hospital, labs, medication reconciliation done.  Physical therapy and Occupational Therapy has already started coming out to her home for therapy regarding weakness   She has scheduled follow-up with pulmonology but it is not until February 2021.  She has labs today when she leaves here for nephrology and upcoming appointment with them.   Colitis-much improved with fluids, hydration and antibiotics  Weakness-continue efforts to hydrate, return diet IV to normal, continue using walker support,  Asthma-continue Breo Proventil inhaler, albuterol rescue  We discussed the finding of atherosclerosis.  We discussed the need for statin. I will hold off on this in the short-term until she feels much improved over the next 2 weeks  Loose stools-continue to hydrate well, can begin probiotic, gave samples of probiotic  Hypertension-reviewing back through [place] [facility] notes over the past few years, although her blood pressure would run low, the specialist notes commented that her pressures were well controlled and to continue lisinopril for kidney protection.  Although her amlodipine was listed in the chart record as well, nephrology did not comment on amlodipine specifically in the plan.  Since the hospital discharge she has restarted amlodipine but not lisinopril.  Given her blood pressures chronically run low she does not need to be on the combination she was on previously.  She will continue amlodipine for now but will follow up with her kidney for advice on how to proceed  Anemia of chronic disease, iron deficiency-she received Feraheme in the recent hospitalization per hospital notes.  She is currently on ferrous sulfate supplements and will need to continue  this for at least the next 3 to 4 months.  Her hemoglobin was improved today.  Given her pleural effusion with this hospitalization-advise she follow-up with pulmonology   Other issues-for the many years I have seen her, she mainly would come in for acute visits despite numerous attempts to get her to come in for physical visit and wellness exam.  Thus we never really got opportunities often to fully evaluate her chart and update chart records.  So as a review today...  Cancer screening: March 2019 mammogram reviewed.  She is past due for repeat diagnostic right breast mammogram and left screening at this time.  I reviewed her 2017 colonoscopy report  Cervical cancer screening-I cannot find evidence of any recent Pap smear in the last several years.  CT abdomen pelvis showed uterus and bilateral adnexa unremarkable on CT scan 12/05/2019.  Advised on the  need for updated cervical cancer screening   Vaccines: Due for tetanus Update on flu shot Due for shingles vaccine Due for repeat pneumococcal 23 Due for Prevnar 13   I reviewed EKG findings in the chart record for this hospitalization in from 2017 which were abnormal.  I reviewed June 2020 echocardiogram showing normal systolic function left ventricle, EF 60 to 65%, mild thickening of aortic valve, trace aortic insufficiency.   Next visit here need to repeat CBC, possibly iron as well.   [place] was seen today for follow-up.  Diagnoses and all orders for this visit:  Colitis  CRI (chronic renal insufficiency), stage 3 (moderate) -     Hemoglobin  Hypertensive nephropathy  Anemia of renal disease -     Hemoglobin  Scleroderma (HCC)  Long term systemic steroid user  Intrinsic asthma  Atherosclerosis  Raynaud's disease without gangrene  Weakness  Vaccine counseling  Loose stools  Other orders -     fluticasone furoate-vilanterol (BREO ELLIPTA) 200-25 MCG/INH AEPB; Inhale 1 puff into the lungs daily. -      albuterol (PROAIR HFA) 108 (90 Base) MCG/ACT inhaler; Inhale 2 puffs into the lungs every 6 (six) hours as needed for wheezing.  We will work with her to make sure she follows up with pulmonology, gastroenterology, nephrology She will continue with PT/OT Recommend we see her back within the next month or 2 for wellness exam

## 2019-12-15 DIAGNOSIS — E871 Hypo-osmolality and hyponatremia: Secondary | ICD-10-CM | POA: Diagnosis not present

## 2019-12-15 DIAGNOSIS — I7300 Raynaud's syndrome without gangrene: Secondary | ICD-10-CM | POA: Diagnosis not present

## 2019-12-15 DIAGNOSIS — J90 Pleural effusion, not elsewhere classified: Secondary | ICD-10-CM | POA: Diagnosis not present

## 2019-12-15 DIAGNOSIS — E860 Dehydration: Secondary | ICD-10-CM | POA: Diagnosis not present

## 2019-12-15 DIAGNOSIS — K222 Esophageal obstruction: Secondary | ICD-10-CM | POA: Diagnosis not present

## 2019-12-15 DIAGNOSIS — I959 Hypotension, unspecified: Secondary | ICD-10-CM | POA: Diagnosis not present

## 2019-12-15 DIAGNOSIS — K529 Noninfective gastroenteritis and colitis, unspecified: Secondary | ICD-10-CM | POA: Diagnosis not present

## 2019-12-15 DIAGNOSIS — L940 Localized scleroderma [morphea]: Secondary | ICD-10-CM | POA: Diagnosis not present

## 2019-12-15 DIAGNOSIS — R1310 Dysphagia, unspecified: Secondary | ICD-10-CM | POA: Diagnosis not present

## 2019-12-18 DIAGNOSIS — E871 Hypo-osmolality and hyponatremia: Secondary | ICD-10-CM | POA: Diagnosis not present

## 2019-12-18 DIAGNOSIS — E860 Dehydration: Secondary | ICD-10-CM | POA: Diagnosis not present

## 2019-12-18 DIAGNOSIS — K222 Esophageal obstruction: Secondary | ICD-10-CM | POA: Diagnosis not present

## 2019-12-18 DIAGNOSIS — I959 Hypotension, unspecified: Secondary | ICD-10-CM | POA: Diagnosis not present

## 2019-12-18 DIAGNOSIS — I7300 Raynaud's syndrome without gangrene: Secondary | ICD-10-CM | POA: Diagnosis not present

## 2019-12-18 DIAGNOSIS — K529 Noninfective gastroenteritis and colitis, unspecified: Secondary | ICD-10-CM | POA: Diagnosis not present

## 2019-12-18 DIAGNOSIS — R1310 Dysphagia, unspecified: Secondary | ICD-10-CM | POA: Diagnosis not present

## 2019-12-18 DIAGNOSIS — L940 Localized scleroderma [morphea]: Secondary | ICD-10-CM | POA: Diagnosis not present

## 2019-12-18 DIAGNOSIS — J90 Pleural effusion, not elsewhere classified: Secondary | ICD-10-CM | POA: Diagnosis not present

## 2019-12-18 NOTE — Telephone Encounter (Signed)
Referral has been placed. 

## 2019-12-19 ENCOUNTER — Other Ambulatory Visit: Payer: Self-pay

## 2019-12-19 NOTE — Patient Outreach (Signed)
[facility]) Care Management  [place]  12/19/2019   [name] [birth year] [id number]  Subjective: Telephone call to patient for follow up referral.  Explained to patient reason for the referral.  Patient reports that she goes for her follow ups and that she makes her appointments based on her daughter's day off.  Offered patient help in making appointments with her kidney doctor and GI doctor but patient declined wanting CM to assist.  Patient denies transportation issues to appointments as she likes her daughter to take her.  She is aware of [employer] transportation but wishes not to use them.    Discussed with patient [facility] services and support.  Patient agreeable to nurse calls for follow up at this time.    Patient recently hospitalized for colitis. She reports that she is weak but getting her strength back. She has advanced her diet to things like baked chicken and green beans.  Discussed colitis and diet.  She verbalized understanding.  Patient also has a history of scleroderma which has caused some of her health issues.  Patient also admits to HTN as part of her scleroderma. She takes blood pressure medications for which keeps it under control.  Patient also reports long history of asthma for which her [employer] keeps it controlled.  Patient lives in the home with her daughter who assists her as needed and takes her to her appointments.  Patient states she is gaining her strength back and is able to take care of herself and was even able to do a load of laundry since being home.         Objective:   Encounter Medications:  Outpatient Encounter Medications as of 12/19/2019  Medication Sig  . acetaminophen (TYLENOL) 500 MG tablet Take 1,000 mg by mouth every 6 (six) hours as needed for mild pain.  [name] albuterol (PROAIR HFA) 108 (90 Base) MCG/ACT inhaler Inhale 2 puffs into the lungs every 6 (six) hours as needed for wheezing.  [name] albuterol (PROVENTIL) (2.5 MG/3ML)  0.083% nebulizer solution Take 3 mLs (2.5 mg total) by nebulization every 6 (six) hours as needed for wheezing or shortness of breath.  [name] amLODipine (NORVASC) 5 MG tablet Take 5 mg by mouth 2 (two) times daily.   . ferrous sulfate 325 (65 FE) MG EC tablet Take 1 tablet (325 mg total) by mouth daily with breakfast.  . fluticasone furoate-vilanterol (BREO ELLIPTA) 200-25 MCG/INH AEPB Inhale 1 puff into the lungs daily.  . [DISCONTINUED] famotidine (PEPCID) 20 MG tablet Take 20 mg by mouth 2 (two) times daily.     No facility-administered encounter medications on file as of 12/19/2019.    Functional Status:  In your present state of health, do you have any difficulty performing the following activities: 12/19/2019 12/02/2019  Hearing? N N  Vision? N N  Difficulty concentrating or making decisions? N N  Walking or climbing stairs? N N  Dressing or bathing? N N  Doing errands, shopping? N N  Preparing Food and eating ? N -  Using the Toilet? N -  In the past six months, have you accidently leaked urine? N -  Do you have problems with loss of bowel control? N -  Managing your Medications? N -  Managing your Finances? N -  Housekeeping or managing your Housekeeping? N -  Some recent data might be hidden    Fall/Depression Screening: Fall Risk  12/19/2019  Falls in the past year? 0   PHQ 2/9  Scores 12/19/2019  PHQ - 2 Score 1    Assessment: Patient with recent hospitalization related to colitis.  Patient has home health PT involved.     Plan:  [facility] CM Care Plan Problem One     Most Recent Value  Care Plan Problem One  Recent hospitalization colitis  Role Documenting the Problem One  Care Management Telephonic Coordinator  Care Plan for Problem One  Active  [facility] Long Term Goal   Patient will not experience unplanned hospital readmission within the next 31 days.  [facility] Long Term Goal Start Date  12/19/19  Interventions for Problem One Long Term Goal  Discussed with patient current  clinical condition since hospital discharge,  confirmed that patient has all  medications and denies concerns around medications,  reviewed post-hospital discharge instructions with patient and confirmed that patient has reliable transportation to all scheduled provider appointments post-hospital discharge  [facility] CM Short Term Goal #1   Patient will make follow up appointment with GI and Kidney doctors within 21 days  [facility] CM Short Term Goal #1 Start Date  12/19/19  Interventions for Short Term Goal #1  RN CM discussed importance of follow up appointments.  Patient to call to make appointments.  [facility] CM Short Term Goal #2   Patient will be able to verbalize understanding of colitis disease process and recovery within 29 days.  [facility] CM Short Term Goal #2 Start Date  12/19/19  Interventions for Short Term Goal #2  RN CM discussed with patient colitis and symptoms of colitis and current diet.     RN CM will provide ongoing education and support to patient through phone calls.   RN CM will send welcome packet with consent to patient.   RN CM will send initial barriers letter, assessment, and care plan to primary care physician.   RN CM will contact patient next month and patient agrees to next contact.    [name], RN, MSN [facility] Care Management Care Management Coordinator Direct Line [phone] Cell [phone] Toll Free: [phone]  Fax: [phone]

## 2019-12-21 DIAGNOSIS — K222 Esophageal obstruction: Secondary | ICD-10-CM | POA: Diagnosis not present

## 2019-12-21 DIAGNOSIS — J90 Pleural effusion, not elsewhere classified: Secondary | ICD-10-CM | POA: Diagnosis not present

## 2019-12-21 DIAGNOSIS — K529 Noninfective gastroenteritis and colitis, unspecified: Secondary | ICD-10-CM | POA: Diagnosis not present

## 2019-12-21 DIAGNOSIS — I959 Hypotension, unspecified: Secondary | ICD-10-CM | POA: Diagnosis not present

## 2019-12-21 DIAGNOSIS — E871 Hypo-osmolality and hyponatremia: Secondary | ICD-10-CM | POA: Diagnosis not present

## 2019-12-21 DIAGNOSIS — I7300 Raynaud's syndrome without gangrene: Secondary | ICD-10-CM | POA: Diagnosis not present

## 2019-12-21 DIAGNOSIS — R1310 Dysphagia, unspecified: Secondary | ICD-10-CM | POA: Diagnosis not present

## 2019-12-21 DIAGNOSIS — L940 Localized scleroderma [morphea]: Secondary | ICD-10-CM | POA: Diagnosis not present

## 2019-12-21 DIAGNOSIS — E860 Dehydration: Secondary | ICD-10-CM | POA: Diagnosis not present

## 2019-12-26 ENCOUNTER — Encounter: Payer: Self-pay | Admitting: Primary Care

## 2019-12-26 ENCOUNTER — Ambulatory Visit: Payer: Medicare PPO | Admitting: Primary Care

## 2019-12-26 ENCOUNTER — Other Ambulatory Visit: Payer: Self-pay

## 2019-12-26 ENCOUNTER — Ambulatory Visit (INDEPENDENT_AMBULATORY_CARE_PROVIDER_SITE_OTHER): Payer: Medicare PPO

## 2019-12-26 VITALS — BP 94/60 | HR 75

## 2019-12-26 DIAGNOSIS — J45909 Unspecified asthma, uncomplicated: Secondary | ICD-10-CM

## 2019-12-26 DIAGNOSIS — J690 Pneumonitis due to inhalation of food and vomit: Secondary | ICD-10-CM | POA: Diagnosis not present

## 2019-12-26 DIAGNOSIS — J90 Pleural effusion, not elsewhere classified: Secondary | ICD-10-CM

## 2019-12-26 DIAGNOSIS — J189 Pneumonia, unspecified organism: Secondary | ICD-10-CM | POA: Insufficient documentation

## 2019-12-26 DIAGNOSIS — R918 Other nonspecific abnormal finding of lung field: Secondary | ICD-10-CM | POA: Diagnosis not present

## 2019-12-26 MED ORDER — PREDNISONE 10 MG PO TABS: ORAL_TABLET | ORAL | 0 refills | Status: DC

## 2019-12-26 NOTE — Patient Instructions (Addendum)
Recommendations: Continue Breo one puff once daily Continue Albuterol nebulizer every 6 hours as needed for breakthrough shortness of breath/wheezing  Orders: CXR re: pleural effusion  Follow-up: 1-2 months with Dr. Lamonte [name]     Scleroderma Scleroderma is a rare and long-term (chronic) disease of the immune system. The immune system protects the body by attacking germs that cause illness. If you have scleroderma, your immune system mistakenly attacks your skin and other parts of your body instead. This is called an autoimmune disease. Scleroderma means hardening of the skin. If you have a mild form of this condition, it may affect only your skin (localized scleroderma). If you have a severe form, it may affect your skin and also your blood vessels, lungs, kidneys, heart, and digestive system (systemic scleroderma). What are the causes? The cause of this condition is not known. What increases the risk? You are more likely to develop this condition if:  You have a family history of scleroderma.  You are female.  You are 26-53 years old. What are the signs or symptoms? Symptoms of this condition depend on the type of scleroderma you have. They also vary from person to person. Symptoms of localized scleroderma may include:  Discolored patches of skin (morphea). These may be thick and waxy.  Bands of thick, hard skin on your arms, legs, or face (linear scleroderma).  Tightening of the skin that limits how well you can move your joints.  Open skin sores. Symptoms of systemic scleroderma may include:  Discoloration of the fingers and sometimes the toes (Raynaud's phenomenon). Your fingers or toes may turn blue, white, or red. You may also have tingling or numbness. Exposure to cold often triggers this symptom.  Tightening of the skin of the fingers, hands, arms, neck, and face.  Enlarged blood vessels of the hands, face, and nail beds (telangiectasias).  Calcium deposits under your  skin (calcinosis).  Joint pain.  Heartburn.  High blood pressure.  Constipation.  Trouble swallowing (dysphagia).  Trouble breathing. How is this diagnosed? This condition may be diagnosed based on:  Your symptoms and medical history.  A physical exam.  Tests, such as: ? Imaging studies. This may include X-rays and CT scan. ? Blood tests. ? Lung (pulmonary) function tests. Scleroderma can be hard to diagnose because other diseases have many of the same symptoms. You may need to see specialists as directed by your health care provider. How is this treated? There is no cure for this condition, but treatment can relieve symptoms and prevent complications. Mild symptoms may not need treatment. Treatment may include taking medicines to:  Improve blood flow.  Block production of stomach acid to treat heartburn.  Treat high blood pressure caused by kidney disease.  Relieve joint pain and inflammation.  Treat lung symptoms. Follow these instructions at home:  Medicines  Take over-the-counter and prescription medicines only as told by your health care provider.  Do not drive or use heavy machinery while taking prescription pain medicine. Eating and drinking  Eat a healthy diet.  Drink enough fluid to keep your urine pale yellow.  If you have heartburn: ? Eat smaller meals often. ? Avoid spicy and fatty foods. ? Do not eat meals late in the evening. ? Avoid lying down right after you eat. Lifestyle   If you have Raynaud's phenomenon, cold can trigger your symptoms. To prevent symptoms, you can: ? Wear mittens, a hat, a scarf, and warm footwear. ? Dress in layers during cold weather. ? If possible,  stay indoors during cold weather.  Wear comfortable shoes that are well cushioned.  Protect your skin with sunscreen and moisturizers.  Stretch and exercise regularly.  Maintain a healthy weight. General instructions  Learn as much as you can about scleroderma,  and work closely with your team of health care providers.  Do not use any products that contain nicotine or tobacco, such as cigarettes and e-cigarettes. If you need help quitting, ask your health care provider.  Ask your health care provider how to check your blood pressure at home. Check it as directed by your health care provider.  Make sure you have a good support system at home.  Use skin moisturizer to keep your skin moist.  Keep all follow-up visits as told by your health care provider. This is important. Contact a health care provider if:  Your scleroderma symptoms change or become worse.  You have concerns about your mental health. Get help right away if:  A finger or toe becomes painful or numb.  A finger or toe turns black or a very dark color.  You have: ? Swelling, pain, or tenderness in an arm or leg. ? Trouble swallowing. ? Trouble breathing. ? Chest pain.  You cough up blood. Summary  Scleroderma is a rare and long-term (chronic) disease of the immune system. If you have scleroderma, your immune system mistakenly attacks your skin and other parts of your body.  Symptoms of this condition depend on the type of scleroderma you have. They also vary from person to person.  There is no cure for this condition, but treatment can relieve symptoms and prevent complications. This information is not intended to replace advice given to you by your health care provider. Make sure you discuss any questions you have with your health care provider. Document Released: 03/06/2003 Document Revised: 01/21/2018 Document Reviewed: 01/21/2018 Elsevier Patient Education  2020 Elsevier Inc.   Pleural Effusion Pleural effusion is an abnormal buildup of fluid in the layers of tissue between the lungs and the inside of the chest (pleural space) The two layers of tissue that line the lungs and the inside of the chest are called pleura. Usually, there is no air in the space between the  pleura, only a thin layer of fluid. Some conditions can cause a large amount of fluid to build up, which can cause the lung to collapse if untreated. A pleural effusion is usually caused by another disease that requires treatment. What are the causes? Pleural effusion can be caused by:  Heart failure.  Certain infections, such as pneumonia or tuberculosis.  Cancer.  A blood clot in the lung (pulmonary embolism).  Complications from surgery, such as from open heart surgery.  Liver disease (cirrhosis).  Kidney disease. What are the signs or symptoms? In some cases, pleural effusion may cause no symptoms. If symptoms are present, they may include:  Shortness of breath, especially when lying down.  Chest pain. This may get worse when taking a deep breath.  Fever.  Dry, long-lasting (chronic) cough.  Hiccups.  Rapid breathing. An underlying condition that is causing the pleural effusion (such as heart failure, pneumonia, blood clots, tuberculosis, or cancer) may also cause other symptoms. How is this diagnosed? This condition may be diagnosed based on:  Your symptoms and medical history.  A physical exam.  A chest X-ray.  A procedure to use a needle to remove fluid from the pleural space (thoracentesis). This fluid is tested.  Other imaging studies of the chest, such  as ultrasound or CT scan. How is this treated? Depending on the cause of your condition, treatment may include:  Treating the underlying condition that is causing the effusion. When that condition improves, the effusion will also improve. Examples of treatment for underlying conditions include: ? Antibiotic medicines to treat an infection. ? Diuretics or other heart medicines to treat heart failure.  Thoracentesis.  Placing a thin flexible tube under your skin and into your chest to continuously drain the effusion (indwelling pleural catheter).  Surgery to remove the outer layer of tissue from the  pleural space (decortication).  A procedure to put medicine into the chest cavity to seal the pleural space and prevent fluid buildup (pleurodesis).  Chemotherapy and radiation therapy, if you have cancerous (malignant) pleural effusion. These treatments are typically used to treat cancer. They kill certain cells in the body. Follow these instructions at home:  Take over-the-counter and prescription medicines only as told by your health care provider.  Ask your health care provider what activities are safe for you.  Keep track of how long you are able to do mild exercise (such as walking) before you get short of breath. Write down this information to share with your health care provider. Your ability to exercise should improve over time.  Do not use any products that contain nicotine or tobacco, such as cigarettes and e-cigarettes. If you need help quitting, ask your health care provider.  Keep all follow-up visits as told by your health care provider. This is important. Contact a health care provider if:  The amount of time that you are able to do mild exercise: ? Decreases. ? Does not improve with time.  You have a fever. Get help right away if:  You are short of breath.  You develop chest pain.  You develop a new cough. Summary  Pleural effusion is an abnormal buildup of fluid in the layers of tissue between the lungs and the inside of the chest.  Pleural effusion can have many causes, including heart failure, pulmonary embolism, infections, or cancer.  Symptoms of pleural effusion can include shortness of breath, chest pain, fever, long-lasting (chronic) cough, hiccups, or rapid breathing.  Diagnosis often involves making images of the chest (such as with ultrasound or X-ray) and removing fluid (thoracentesis) to send for testing.  Treatment for pleural effusion depends on what underlying condition is causing it. This information is not intended to replace advice given to  you by your health care provider. Make sure you discuss any questions you have with your health care provider. Document Released: 12/14/2005 Document Revised: 11/26/2017 Document Reviewed: 08/19/2017 Elsevier Patient Education  2020 [employer]

## 2019-12-26 NOTE — Assessment & Plan Note (Addendum)
-   Intermittent shortness of breath and wheezing heard on exam - Continue Breo 200 one puff once daily - Continue Albuterol nebulizer every 6 hours as needed for breakthrough shortness of breath/wheezing - RX prednisone 20mg  x additional 5 days

## 2019-12-26 NOTE — Progress Notes (Signed)
  ID: [name], female    DOB: 06/08/1956, 63 y.o.   MRN: [id number]  No chief complaint on file.   Referring provider: Genia Del  HPI: 63 year old female, never smoked. PMH significant for intrinsic asthma, scleroderma, colitis, GERD, anemia, raynaud's disease. Patient of Dr. Delton Coombes, last seen on 08/24/19. Maintained on Breo 200, prn albuterol hfa.  12/26/2019 Patient presents today for hospital follow-up. She was hospitalized from 12/4-12/11 for viral gastroenteritis, left pleural effusion and possible aspiration pneumonia. She underwent two thoracentesis, fluid consistent with transudative. Repeat CXR on 12/10 showed persistent consolidation over RUL and small bilateral pleural effusions. She was sent home on oral Augmentin.   Reports that she is doing ok. She has intermittent shortness of breath with chest tightness. Symptoms are more apparent at night. States that she has a difficult time coughing up mucus but when she does she feels better. She is compliant with Breo 200 one puff once daily. She uses her nebulizer once daily which is down from three times daily when she first came out of the hospital. Reports continued weakness. She is receiving home physical therapy. Last BM was yesterday, no recent diarrhea. Her weight is down. BP is chronically low in 90s, she is on Amlodipine twice daily and lisinopril. No fevers.    Allergies  Allergen Reactions  . Avelox [Moxifloxacin Hcl In Nacl]     Caused tachycardia  . Codeine Hives and Other (See Comments)    Hallucination,   . Penicillins Hives    No anaphylaxis/SJS symptoms, > 10 yr ago, no hospitalization required States she has tolerated amoxicillin     Immunization History  Administered Date(s) Administered  . H1N1 12/25/2008  . Influenza Split 11/12/2015, 11/07/2016  . Influenza Whole 09/27/2009  . Influenza,inj,Quad PF,6+ Mos 10/30/2017, 10/29/2018, 10/27/2019  . Pneumococcal Polysaccharide-23  12/04/2011    Past Medical History:  Diagnosis Date  . Allergic rhinitis   . Asthma   . Atherosclerosis   . Chronic kidney disease (CKD), stage III (moderate)   . Esophageal stricture   . GERD (gastroesophageal reflux disease)   . History of anemia due to chronic kidney disease   . History of colitis 2017  . Hypertension   . Metabolic bone disease   . Raynaud disease   . Scleroderma (HCC)     Tobacco History: Social History   Tobacco Use  Smoking Status Never Smoker  Smokeless Tobacco Never Used   Counseling given: Not Answered   Outpatient Medications Prior to Visit  Medication Sig Dispense Refill  . acetaminophen (TYLENOL) 500 MG tablet Take 500 mg by mouth every 6 (six) hours as needed for mild pain.     [name] albuterol (PROAIR HFA) 108 (90 Base) MCG/ACT inhaler Inhale 2 puffs into the lungs every 6 (six) hours as needed for wheezing. 18 g 1  . albuterol (PROVENTIL) (2.5 MG/3ML) 0.083% nebulizer solution Take 3 mLs (2.5 mg total) by nebulization every 6 (six) hours as needed for wheezing or shortness of breath. 75 mL 1  . amLODipine (NORVASC) 5 MG tablet Take 5 mg by mouth 2 (two) times daily.     . ferrous sulfate 325 (65 FE) MG EC tablet Take 1 tablet (325 mg total) by mouth daily with breakfast. 30 tablet 0  . fluticasone furoate-vilanterol (BREO ELLIPTA) 200-25 MCG/INH AEPB Inhale 1 puff into the lungs daily. 1 each 3  . lisinopril (ZESTRIL) 5 MG tablet Take 5 mg by mouth daily.    [name]  omeprazole (PRILOSEC) 20 MG capsule Take 20 mg by mouth daily.     No facility-administered medications prior to visit.    Review of Systems  Review of Systems  Constitutional: Negative for fever.  Respiratory: Positive for wheezing.   Cardiovascular: Negative.   Neurological: Positive for weakness.    Physical Exam  BP 94/60 (BP Location: Left Arm, Cuff Size: Small)   Pulse 75   SpO2 100%  Physical Exam Constitutional:      General: She is not in acute distress.     Appearance: Normal appearance.     Comments: Thin adult female   Cardiovascular:     Rate and Rhythm: Normal rate and regular rhythm.  Pulmonary:     Effort: Pulmonary effort is normal. No respiratory distress.     Breath sounds: No stridor. Wheezing present. No rhonchi.     Comments: RUL expiratory wheeze Neurological:     Mental Status: She is alert.  Psychiatric:        Mood and Affect: Mood normal.        Behavior: Behavior normal.        Thought Content: Thought content normal.        Judgment: Judgment normal.      Lab Results:  CBC    Component Value Date/Time   WBC 4.9 12/08/2019 0414   RBC 2.69 (L) 12/08/2019 0414   HGB 7.5 (L) 12/08/2019 0414   HGB 9.9 (L) 11/30/2019 1119   HCT 23.7 (L) 12/08/2019 0414   HCT 30.6 (L) 11/30/2019 1119   PLT 522 (H) 12/08/2019 0414   PLT 727 (H) 11/30/2019 1119   MCV 88.1 12/08/2019 0414   MCV 91 11/30/2019 1119   MCH 27.9 12/08/2019 0414   MCHC 31.6 12/08/2019 0414   RDW 13.6 12/08/2019 0414   RDW 11.9 11/30/2019 1119   LYMPHSABS 1.1 12/08/2019 0414   LYMPHSABS 0.9 11/30/2019 1119   MONOABS 0.7 12/08/2019 0414   EOSABS 0.7 (H) 12/08/2019 0414   EOSABS 0.0 11/30/2019 1119   BASOSABS 0.1 12/08/2019 0414   BASOSABS 0.1 11/30/2019 1119    BMET    Component Value Date/Time   NA 133 (L) 12/08/2019 0414   NA 122 (L) 11/30/2019 1119   K 4.6 12/08/2019 0414   CL 100 12/08/2019 0414   CO2 25 12/08/2019 0414   GLUCOSE 90 12/08/2019 0414   BUN 9 12/08/2019 0414   BUN 14 11/30/2019 1119   CREATININE 1.02 (H) 12/08/2019 0414   CALCIUM 7.6 (L) 12/08/2019 0414   GFRNONAA 59 (L) 12/08/2019 0414   GFRAA >60 12/08/2019 0414    BNP    Component Value Date/Time   BNP 137.0 (H) 12/01/2019 1841    ProBNP    Component Value Date/Time   PROBNP >3200.0 (H) 02/04/2010 0540    Imaging: CT ABDOMEN PELVIS WO CONTRAST  Result Date: 12/05/2019 CLINICAL DATA:  63 year old female with abdominal and pelvic pain. Pleural effusion.  History of scleroderma. EXAM: CT CHEST, ABDOMEN AND PELVIS WITHOUT CONTRAST TECHNIQUE: Multidetector CT imaging of the chest, abdomen and pelvis was performed following the standard protocol without IV contrast. COMPARISON:  12/01/2019 CT and prior studies FINDINGS: CT CHEST FINDINGS Cardiovascular: Mild cardiomegaly, and mild coronary artery and aortic atherosclerotic calcifications again noted. A trace pericardial effusion is again noted. Mediastinum/Nodes: Distended esophagus filled with fluid is again noted. No enlarged lymph nodes. Lungs/Pleura: Increasing ground-glass opacities within both lungs noted, greatest in the RIGHT UPPER lobe. Small bilateral pleural effusions are noted,  increased on the RIGHT and decreased on the LEFT. Mild bibasilar atelectasis noted. No discrete mass or pneumothorax. Musculoskeletal: No acute or suspicious bony abnormalities. CT ABDOMEN PELVIS FINDINGS Please note that parenchymal abnormalities may be missed without intravenous contrast. Hepatobiliary: No hepatic or definite gallbladder abnormalities noted. No biliary dilatation. Pancreas: Unremarkable Spleen: Unremarkable Adrenals/Urinary Tract: Punctate nonobstructing bilateral renal calculi and a small LEFT renal cyst are again noted. No hydronephrosis or obstructing urinary calculi. The adrenal glands and bladder are unremarkable. Stomach/Bowel: Proximal and mid small bowel wall thickening identified. More focally area of mildly distended bowel with probable wall thickening noted within the central pelvis) series 2: Images 96-101). Mild circumferential wall thickening of the colon at the splenic flexure is again identified, dating back to 2017. No other bowel abnormalities are noted. Vascular/Lymphatic: Aortic atherosclerosis. No enlarged abdominal or pelvic lymph nodes. Reproductive: Uterus and bilateral adnexa are unremarkable. Other: A trace amount of ascites is noted within the pelvis. No evidence of pneumoperitoneum or  definite focal collection. Musculoskeletal: No acute or suspicious bony abnormalities identified. IMPRESSION: 1. Nonspecific increasing ground-glass opacities within both lungs, greatest in the RIGHT UPPER lobe. This may represent an infectious or inflammatory process. 2. Small bilateral pleural effusions, increased on the RIGHT and decreased on the LEFT. Trace amount of ascites. 3. Distended esophagus filled with fluid compatible with scleroderma. 4. Proximal and mid small bowel wall thickening with focal area of mildly distended bowel with probable wall thickening within the central pelvis - nonspecific but may represent an area of scleroderma changes or other enteritis. 5. Aortic Atherosclerosis (ICD10-I70.0). Electronically Signed   By: Harmon Pier [name]   On: 12/05/2019 18:12   CT Abdomen Pelvis Wo Contrast  Result Date: 12/01/2019 CLINICAL DATA:  Abdominal pain and diarrhea. Pleural effusion. History of scleroderma EXAM: CT CHEST, ABDOMEN AND PELVIS WITHOUT CONTRAST TECHNIQUE: Multidetector CT imaging of the chest, abdomen and pelvis was performed following the standard protocol without IV contrast. COMPARISON:  CT chest 06/15/2019 and CT AP 06/24/2016. FINDINGS: CT CHEST FINDINGS Cardiovascular: The heart size is within normal limits. No pericardial effusion identified. Aortic atherosclerosis. Mediastinum/Nodes: Normal appearance of the thyroid gland. The trachea appears patent and is midline. Thin walled dilated esophagus with air-fluid level is again noted compatible with the clinical history of scleroderma. No enlarged axillary, supraclavicular, or mediastinal adenopathy. Lungs/Pleura: There is a large left pleural effusion, new compared with previous exam. Associated near complete atelectasis of the left lower lobe is identified. Loculated fluid extending along the oblique fissure is noted. Within the right lung there are multifocal areas of interlobular septal thickening and ground-glass attenuation  involving the right lower lobe, right middle lobe, and peripheral right upper lobe. Musculoskeletal: No chest wall mass or suspicious bone lesions identified. CT ABDOMEN PELVIS FINDINGS Hepatobiliary: Hepatic steatosis. No focal liver abnormality is seen. No gallstones, gallbladder wall thickening, or biliary dilatation. Pancreas: Unremarkable. No pancreatic ductal dilatation or surrounding inflammatory changes. Spleen: Normal in size without focal abnormality. Adrenals/Urinary Tract: Normal adrenal glands. Cyst within upper pole of the left kidney is incompletely characterized without IV contrast measuring 1.1 cm, 65/4. No mass or hydronephrosis identified bilaterally. The urinary bladder is normal. Stomach/Bowel: Stomach is normal. Small bowel loops are unremarkable. Mild diffuse wall thickening involving the colon is noted particularly at the level of the hepatic flexure. At the a patent flexure there is mild pericolonic soft tissue stranding noted as well. No pneumatosis. Vascular/Lymphatic: Aortic atherosclerosis. No abdominopelvic adenopathy. Reproductive: Uterus and bilateral adnexa are  unremarkable. Other: No free fluid or fluid collections. Musculoskeletal: No acute or significant osseous findings. IMPRESSION: 1. Large left pleural effusion is identified with near complete atelectasis of the left lower lobe. Etiology is indeterminate. New when compared with 06/15/2019. Consider further evaluation with diagnostic/therapeutic thoracentesis. 2. There are multifocal areas of interlobular septal thickening and ground-glass attenuation within the right lung which may reflect an inflammatory or infectious process. 3. Mild diffuse wall thickening and pericolonic soft tissue stranding involving the colon particularly at the level of the hepatic flexure. Findings compatible with colitis. 4. Aortic atherosclerosis. 5. Thin walled dilated esophagus with air-fluid level compatible with the clinical history of  scleroderma. 6. Hepatic steatosis. Aortic Atherosclerosis (ICD10-I70.0). Electronically Signed   By: Kerby Moors [name]   On: 12/01/2019 19:26   DG Chest 1 View  Result Date: 12/04/2019 CLINICAL DATA:  Left-sided thoracentesis. EXAM: CHEST  1 VIEW COMPARISON:  Chest x-ray 11/03/2019. FINDINGS: No evidence of pneumothorax post thoracentesis. Multifocal pulmonary infiltrates particularly on the right again noted. Small right pleural effusion noted. No pneumothorax. Heart size normal. No acute bony abnormality. IMPRESSION: No evidence of pneumothorax post thoracentesis. Electronically Signed   By: Marcello Moores  Register   On: 12/04/2019 12:43   DG Chest 1 View  Result Date: 12/02/2019 CLINICAL DATA:  Post LEFT thoracentesis EXAM: CHEST  1 VIEW COMPARISON:  Portable exam 1242 hours compared to CT chest of 12/01/2019 FINDINGS: Upper normal heart size. Atherosclerotic calcification aorta. Bibasilar effusions and minimal atelectasis. Persistent opacities identified in RIGHT upper and RIGHT lower lobes. No pneumothorax following thoracentesis. Underlying emphysematous changes and osseous demineralization. IMPRESSION: No pneumothorax following LEFT thoracentesis. Emphysematous changes with persistent infiltrates at RIGHT upper lobe and RIGHT lower lobes. Electronically Signed   By: Lavonia Dana [name]   On: 12/02/2019 12:52   Chest xray  Result Date: 12/26/2019 CLINICAL DATA:  History of pneumonia.  Subsequent encounter. EXAM: CHEST - 2 VIEW COMPARISON:  CT chest 12/05/2019. Single-view of the chest 12/04/2019 and 12/07/2019. FINDINGS: Airspace opacities in the right upper lobe and lung bases have markedly improved since the comparison examination. Mild streaky opacities persist. No pneumothorax or pleural effusion. Heart size is normal. Atherosclerosis noted. No acute or focal bony abnormality. Calcifications about the right coracoid process may be secondary to old trauma or degenerative change. IMPRESSION: Marked  improvement in bilateral airspace disease with residual streaky opacities noted. Electronically Signed   By: Inge Rise [name]   On: 12/26/2019 10:43   CT CHEST WO CONTRAST  Result Date: 12/05/2019 CLINICAL DATA:  63 year old female with abdominal and pelvic pain. Pleural effusion. History of scleroderma. EXAM: CT CHEST, ABDOMEN AND PELVIS WITHOUT CONTRAST TECHNIQUE: Multidetector CT imaging of the chest, abdomen and pelvis was performed following the standard protocol without IV contrast. COMPARISON:  12/01/2019 CT and prior studies FINDINGS: CT CHEST FINDINGS Cardiovascular: Mild cardiomegaly, and mild coronary artery and aortic atherosclerotic calcifications again noted. A trace pericardial effusion is again noted. Mediastinum/Nodes: Distended esophagus filled with fluid is again noted. No enlarged lymph nodes. Lungs/Pleura: Increasing ground-glass opacities within both lungs noted, greatest in the RIGHT UPPER lobe. Small bilateral pleural effusions are noted, increased on the RIGHT and decreased on the LEFT. Mild bibasilar atelectasis noted. No discrete mass or pneumothorax. Musculoskeletal: No acute or suspicious bony abnormalities. CT ABDOMEN PELVIS FINDINGS Please note that parenchymal abnormalities may be missed without intravenous contrast. Hepatobiliary: No hepatic or definite gallbladder abnormalities noted. No biliary dilatation. Pancreas: Unremarkable Spleen: Unremarkable Adrenals/Urinary Tract: Punctate nonobstructing bilateral renal  calculi and a small LEFT renal cyst are again noted. No hydronephrosis or obstructing urinary calculi. The adrenal glands and bladder are unremarkable. Stomach/Bowel: Proximal and mid small bowel wall thickening identified. More focally area of mildly distended bowel with probable wall thickening noted within the central pelvis) series 2: Images 96-101). Mild circumferential wall thickening of the colon at the splenic flexure is again identified, dating back to  2017. No other bowel abnormalities are noted. Vascular/Lymphatic: Aortic atherosclerosis. No enlarged abdominal or pelvic lymph nodes. Reproductive: Uterus and bilateral adnexa are unremarkable. Other: A trace amount of ascites is noted within the pelvis. No evidence of pneumoperitoneum or definite focal collection. Musculoskeletal: No acute or suspicious bony abnormalities identified. IMPRESSION: 1. Nonspecific increasing ground-glass opacities within both lungs, greatest in the RIGHT UPPER lobe. This may represent an infectious or inflammatory process. 2. Small bilateral pleural effusions, increased on the RIGHT and decreased on the LEFT. Trace amount of ascites. 3. Distended esophagus filled with fluid compatible with scleroderma. 4. Proximal and mid small bowel wall thickening with focal area of mildly distended bowel with probable wall thickening within the central pelvis - nonspecific but may represent an area of scleroderma changes or other enteritis. 5. Aortic Atherosclerosis (ICD10-I70.0). Electronically Signed   By: Harmon Pier [name]   On: 12/05/2019 18:12   CT Chest Wo Contrast  Result Date: 12/01/2019 CLINICAL DATA:  Abdominal pain and diarrhea. Pleural effusion. History of scleroderma EXAM: CT CHEST, ABDOMEN AND PELVIS WITHOUT CONTRAST TECHNIQUE: Multidetector CT imaging of the chest, abdomen and pelvis was performed following the standard protocol without IV contrast. COMPARISON:  CT chest 06/15/2019 and CT AP 06/24/2016. FINDINGS: CT CHEST FINDINGS Cardiovascular: The heart size is within normal limits. No pericardial effusion identified. Aortic atherosclerosis. Mediastinum/Nodes: Normal appearance of the thyroid gland. The trachea appears patent and is midline. Thin walled dilated esophagus with air-fluid level is again noted compatible with the clinical history of scleroderma. No enlarged axillary, supraclavicular, or mediastinal adenopathy. Lungs/Pleura: There is a large left pleural effusion,  new compared with previous exam. Associated near complete atelectasis of the left lower lobe is identified. Loculated fluid extending along the oblique fissure is noted. Within the right lung there are multifocal areas of interlobular septal thickening and ground-glass attenuation involving the right lower lobe, right middle lobe, and peripheral right upper lobe. Musculoskeletal: No chest wall mass or suspicious bone lesions identified. CT ABDOMEN PELVIS FINDINGS Hepatobiliary: Hepatic steatosis. No focal liver abnormality is seen. No gallstones, gallbladder wall thickening, or biliary dilatation. Pancreas: Unremarkable. No pancreatic ductal dilatation or surrounding inflammatory changes. Spleen: Normal in size without focal abnormality. Adrenals/Urinary Tract: Normal adrenal glands. Cyst within upper pole of the left kidney is incompletely characterized without IV contrast measuring 1.1 cm, 65/4. No mass or hydronephrosis identified bilaterally. The urinary bladder is normal. Stomach/Bowel: Stomach is normal. Small bowel loops are unremarkable. Mild diffuse wall thickening involving the colon is noted particularly at the level of the hepatic flexure. At the a patent flexure there is mild pericolonic soft tissue stranding noted as well. No pneumatosis. Vascular/Lymphatic: Aortic atherosclerosis. No abdominopelvic adenopathy. Reproductive: Uterus and bilateral adnexa are unremarkable. Other: No free fluid or fluid collections. Musculoskeletal: No acute or significant osseous findings. IMPRESSION: 1. Large left pleural effusion is identified with near complete atelectasis of the left lower lobe. Etiology is indeterminate. New when compared with 06/15/2019. Consider further evaluation with diagnostic/therapeutic thoracentesis. 2. There are multifocal areas of interlobular septal thickening and ground-glass attenuation within the right lung  which may reflect an inflammatory or infectious process. 3. Mild diffuse wall  thickening and pericolonic soft tissue stranding involving the colon particularly at the level of the hepatic flexure. Findings compatible with colitis. 4. Aortic atherosclerosis. 5. Thin walled dilated esophagus with air-fluid level compatible with the clinical history of scleroderma. 6. Hepatic steatosis. Aortic Atherosclerosis (ICD10-I70.0). Electronically Signed   By: Signa Kell [name]   On: 12/01/2019 19:26   DG Chest Port 1 View  Result Date: 12/07/2019 CLINICAL DATA:  Dyspnea. EXAM: PORTABLE CHEST 1 VIEW COMPARISON:  12/04/2019 FINDINGS: Lungs are adequately inflated demonstrate persistent consolidation over the right upper lobe without significant change. Mild hazy bibasilar opacification as well as mild hazy prominence of the perihilar vessels unchanged. Small bilateral pleural effusions unchanged. Borderline stable cardiomegaly. Remainder of the exam is unchanged. IMPRESSION: 1. Stable airspace consolidation over the right upper lobe likely pneumonia. Stable bibasilar opacification which may be due to infection or atelectasis. Stable small bilateral pleural effusions. 2. Borderline stable cardiomegaly with suggestion of minimal vascular congestion. Electronically Signed   By: Elberta Fortis [name]   On: 12/07/2019 07:48   DG CHEST PORT 1 VIEW  Result Date: 12/03/2019 CLINICAL DATA:  Shortness of breath. EXAM: PORTABLE CHEST 1 VIEW COMPARISON:  12/02/2019 FINDINGS: Stable cardiomegaly. Airspace disease in right upper lobe and right lung base shows no significant change. Mild increase in airspace opacity is seen in the left lung base. Probable small left pleural effusion noted. IMPRESSION: 1. No significant change in right upper and lower lobe airspace disease. 2. Mild increase in left lower lobe airspace disease. Probable small left pleural effusion. Electronically Signed   By: Danae Orleans [name]   On: 12/03/2019 06:48   US THORACENTESIS ASP PLEURAL SPACE W/IMG GUIDE  Result Date:  12/04/2019 INDICATION: History of diarrhea/colitis, scleroderma, acute on chronic kidney disease, dyspnea, large left pleural effusion. Request for therapeutic left thoracentesis. EXAM: ULTRASOUND GUIDED LEFT THORACENTESIS MEDICATIONS: 1% lidocaine 8 mL COMPLICATIONS: None immediate. PROCEDURE: An ultrasound guided thoracentesis was thoroughly discussed with the patient and questions answered. The benefits, risks, alternatives and complications were also discussed. The patient understands and wishes to proceed with the procedure. Written consent was obtained. Ultrasound was performed to localize and mark an adequate pocket of fluid in the left chest. The area was then prepped and draped in the normal sterile fashion. 1% Lidocaine was used for local anesthesia. Under ultrasound guidance a 6 Fr Safe-T-Centesis catheter was introduced. Thoracentesis was performed. The catheter was removed and a dressing applied. FINDINGS: A total of approximately 550 mL of clear yellow fluid was removed. IMPRESSION: Successful ultrasound guided left thoracentesis yielding 550 mL of pleural fluid. No pneumothorax on post-procedure chest x-ray. Read by: Corrin Parker, PA-C Electronically Signed   By: Simonne Come [name]   On: 12/04/2019 13:07   US THORACENTESIS ASP PLEURAL SPACE W/IMG GUIDE  Result Date: 12/02/2019 INDICATION: Patient with history of diarrhea/colitis, scleroderma, acute on chronic kidney disease, dyspnea, large left pleural effusion. Request made for diagnostic and therapeutic left thoracentesis. EXAM: ULTRASOUND GUIDED DIAGNOSTIC AND THERAPEUTIC LEFT THORACENTESIS MEDICATIONS: None COMPLICATIONS: None immediate. PROCEDURE: An ultrasound guided thoracentesis was thoroughly discussed with the patient and questions answered. The benefits, risks, alternatives and complications were also discussed. The patient understands and wishes to proceed with the procedure. Written consent was obtained. Ultrasound was performed to  localize and mark an adequate pocket of fluid in the left chest. The area was then prepped and draped in the normal sterile fashion.  1% Lidocaine was used for local anesthesia. Under ultrasound guidance a 6 Fr Safe-T-Centesis catheter was introduced. Thoracentesis was performed. The catheter was removed and a dressing applied. FINDINGS: A total of approximately 1.5 liters of slightly hazy, yellow fluid was removed. Samples were sent to the laboratory as requested by the clinical team. Due to patient's hypotension, this being her initial thoracentesis and acute on chronic kidney disease only the above amount of fluid was removed today. IMPRESSION: Successful ultrasound guided diagnostic and therapeutic left thoracentesis yielding 1.5 liters of pleural fluid. Read by: Jeananne Rama, PA-C Electronically Signed   By: Corlis Leak [name]   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 Mild streaky opacities persist. No pneumothorax or pleural effusion - FU OV in 4-8 weeks with repeat CXR to ensure complete resolution of opacity   Pleural effusion - Most likely d/t recent pneumonia - S/p thoracentesis x2, fluid transudative - CXR today showed no residual effusion   Glenford Bayley, NP 12/26/2019

## 2019-12-26 NOTE — Assessment & Plan Note (Signed)
-   Most likely d/t recent pneumonia - S/p thoracentesis x2, fluid transudative - CXR today showed no residual effusion

## 2019-12-26 NOTE — Assessment & Plan Note (Addendum)
-   Clinically improved - Repeat CXR today showed marked improvement in RUL and lung base opacities. Mild streaky opacities persist. No pneumothorax or pleural effusion - FU OV in 4-8 weeks with repeat CXR to ensure complete resolution of opacity

## 2019-12-26 NOTE — Progress Notes (Signed)
Please let patient know pneumonia has improved, residual opacity noted. No pleural effusion. Recommend repeat CXR in 4-6 weeks.

## 2019-12-27 DIAGNOSIS — E871 Hypo-osmolality and hyponatremia: Secondary | ICD-10-CM | POA: Diagnosis not present

## 2019-12-27 DIAGNOSIS — I7300 Raynaud's syndrome without gangrene: Secondary | ICD-10-CM | POA: Diagnosis not present

## 2019-12-27 DIAGNOSIS — K222 Esophageal obstruction: Secondary | ICD-10-CM | POA: Diagnosis not present

## 2019-12-27 DIAGNOSIS — I959 Hypotension, unspecified: Secondary | ICD-10-CM | POA: Diagnosis not present

## 2019-12-27 DIAGNOSIS — L940 Localized scleroderma [morphea]: Secondary | ICD-10-CM | POA: Diagnosis not present

## 2019-12-27 DIAGNOSIS — R1310 Dysphagia, unspecified: Secondary | ICD-10-CM | POA: Diagnosis not present

## 2019-12-27 DIAGNOSIS — J90 Pleural effusion, not elsewhere classified: Secondary | ICD-10-CM | POA: Diagnosis not present

## 2019-12-27 DIAGNOSIS — K529 Noninfective gastroenteritis and colitis, unspecified: Secondary | ICD-10-CM | POA: Diagnosis not present

## 2019-12-27 DIAGNOSIS — E860 Dehydration: Secondary | ICD-10-CM | POA: Diagnosis not present

## 2019-12-27 NOTE — Progress Notes (Signed)
Called spoke with patient, advised of cxr results / recs as stated by [facility] NP.  Pt verbalized understanding and denied any questions.  Patient is scheduled to see Dr Lamonte Sakai on 02/08/20 and will have cxr at appt.

## 2020-01-01 ENCOUNTER — Other Ambulatory Visit: Payer: Self-pay

## 2020-01-01 NOTE — Patient Outreach (Signed)
[facility]) Care Management  [place]  01/01/2020   ENSLEE BIBBINS 1956-08-14 [id number]  Subjective: Telephone call to patient for follow up. She states that she is doing a whole lot better.  She states she saw the pulmonologist last week and will see kidney doctor nest week.  Discussed diet and overall status and continued recovery. Patient denies any further questions or needs.    Objective:   Encounter Medications:  Outpatient Encounter Medications as of 01/01/2020  Medication Sig  . acetaminophen (TYLENOL) 500 MG tablet Take 500 mg by mouth every 6 (six) hours as needed for mild pain.   [name] albuterol (PROAIR HFA) 108 (90 Base) MCG/ACT inhaler Inhale 2 puffs into the lungs every 6 (six) hours as needed for wheezing.  [name] albuterol (PROVENTIL) (2.5 MG/3ML) 0.083% nebulizer solution Take 3 mLs (2.5 mg total) by nebulization every 6 (six) hours as needed for wheezing or shortness of breath.  [name] amLODipine (NORVASC) 5 MG tablet Take 5 mg by mouth 2 (two) times daily.   . ferrous sulfate 325 (65 FE) MG EC tablet Take 1 tablet (325 mg total) by mouth daily with breakfast.  . fluticasone furoate-vilanterol (BREO ELLIPTA) 200-25 MCG/INH AEPB Inhale 1 puff into the lungs daily.  [name] lisinopril (ZESTRIL) 5 MG tablet Take 5 mg by mouth daily.  [name] omeprazole (PRILOSEC) 20 MG capsule Take 20 mg by mouth daily.  . predniSONE (DELTASONE) 10 MG tablet Take 2 tabs x 5 days  . [DISCONTINUED] famotidine (PEPCID) 20 MG tablet Take 20 mg by mouth 2 (two) times daily.     No facility-administered encounter medications on file as of 01/01/2020.    Functional Status:  In your present state of health, do you have any difficulty performing the following activities: 12/19/2019 12/02/2019  Hearing? N N  Vision? N N  Difficulty concentrating or making decisions? N N  Walking or climbing stairs? N N  Dressing or bathing? N N  Doing errands, shopping? N N  Preparing Food and eating ? N -   Using the Toilet? N -  In the past six months, have you accidently leaked urine? N -  Do you have problems with loss of bowel control? N -  Managing your Medications? N -  Managing your Finances? N -  Housekeeping or managing your Housekeeping? N -  Some recent data might be hidden    Fall/Depression Screening: Fall Risk  12/19/2019  Falls in the past year? 0   PHQ 2/9 Scores 12/19/2019  PHQ - 2 Score 1    Assessment: Patient continues to recover and working on follow up appointments.    Plan:  [facility] CM Care Plan Problem One     Most Recent Value  Care Plan Problem One  Recent hospitalization colitis  Role Documenting the Problem One  Care Management Telephonic Coordinator  Care Plan for Problem One  Active  THN Long Term Goal   Patient will not experience unplanned hospital readmission within the next 31 days.  THN Long Term Goal Start Date  12/19/19  Interventions for Problem One Long Term Goal  RN CM discussed diet and overall status.  Patient followed up with pulomonologist last week.    THN CM Short Term Goal #1   Patient will make follow up appointment with GI and Kidney doctors within 21 days  THN CM Short Term Goal #1 Start Date  12/19/19  Interventions for Short Term Goal #1  Patient to see kideny doctor  next week and has not seenGI yet due to blood work needed.   THN CM Short Term Goal #2   Patient will be able to verbalize understanding of colitis disease process and recovery within 29 days.  THN CM Short Term Goal #2 Start Date  12/19/19  Interventions for Short Term Goal #2  Patient reports she continues to advance her diet and stay away from spicy and fried foods.       RN CM will contact patient again in the month of January and patient agreeable.   [name], RN, MSN [facility] Care Management Care Management Coordinator Direct Line [phone] Cell [phone] Toll Free: [phone]  Fax: [phone]

## 2020-01-02 DIAGNOSIS — N1832 Chronic kidney disease, stage 3b: Secondary | ICD-10-CM | POA: Diagnosis not present

## 2020-01-03 DIAGNOSIS — I959 Hypotension, unspecified: Secondary | ICD-10-CM | POA: Diagnosis not present

## 2020-01-03 DIAGNOSIS — J90 Pleural effusion, not elsewhere classified: Secondary | ICD-10-CM | POA: Diagnosis not present

## 2020-01-03 DIAGNOSIS — K529 Noninfective gastroenteritis and colitis, unspecified: Secondary | ICD-10-CM | POA: Diagnosis not present

## 2020-01-03 DIAGNOSIS — K222 Esophageal obstruction: Secondary | ICD-10-CM | POA: Diagnosis not present

## 2020-01-03 DIAGNOSIS — L940 Localized scleroderma [morphea]: Secondary | ICD-10-CM | POA: Diagnosis not present

## 2020-01-03 DIAGNOSIS — I7300 Raynaud's syndrome without gangrene: Secondary | ICD-10-CM | POA: Diagnosis not present

## 2020-01-03 DIAGNOSIS — R1310 Dysphagia, unspecified: Secondary | ICD-10-CM | POA: Diagnosis not present

## 2020-01-03 DIAGNOSIS — E871 Hypo-osmolality and hyponatremia: Secondary | ICD-10-CM | POA: Diagnosis not present

## 2020-01-03 DIAGNOSIS — E860 Dehydration: Secondary | ICD-10-CM | POA: Diagnosis not present

## 2020-01-09 DIAGNOSIS — K222 Esophageal obstruction: Secondary | ICD-10-CM | POA: Diagnosis not present

## 2020-01-09 DIAGNOSIS — K529 Noninfective gastroenteritis and colitis, unspecified: Secondary | ICD-10-CM | POA: Diagnosis not present

## 2020-01-09 DIAGNOSIS — E871 Hypo-osmolality and hyponatremia: Secondary | ICD-10-CM | POA: Diagnosis not present

## 2020-01-09 DIAGNOSIS — I959 Hypotension, unspecified: Secondary | ICD-10-CM | POA: Diagnosis not present

## 2020-01-09 DIAGNOSIS — R1310 Dysphagia, unspecified: Secondary | ICD-10-CM | POA: Diagnosis not present

## 2020-01-09 DIAGNOSIS — E860 Dehydration: Secondary | ICD-10-CM | POA: Diagnosis not present

## 2020-01-09 DIAGNOSIS — J90 Pleural effusion, not elsewhere classified: Secondary | ICD-10-CM | POA: Diagnosis not present

## 2020-01-09 DIAGNOSIS — I7300 Raynaud's syndrome without gangrene: Secondary | ICD-10-CM | POA: Diagnosis not present

## 2020-01-09 DIAGNOSIS — L940 Localized scleroderma [morphea]: Secondary | ICD-10-CM | POA: Diagnosis not present

## 2020-01-10 DIAGNOSIS — R1310 Dysphagia, unspecified: Secondary | ICD-10-CM | POA: Diagnosis not present

## 2020-01-10 DIAGNOSIS — I959 Hypotension, unspecified: Secondary | ICD-10-CM | POA: Diagnosis not present

## 2020-01-10 DIAGNOSIS — K222 Esophageal obstruction: Secondary | ICD-10-CM | POA: Diagnosis not present

## 2020-01-10 DIAGNOSIS — K529 Noninfective gastroenteritis and colitis, unspecified: Secondary | ICD-10-CM | POA: Diagnosis not present

## 2020-01-10 DIAGNOSIS — I7300 Raynaud's syndrome without gangrene: Secondary | ICD-10-CM | POA: Diagnosis not present

## 2020-01-10 DIAGNOSIS — E871 Hypo-osmolality and hyponatremia: Secondary | ICD-10-CM | POA: Diagnosis not present

## 2020-01-10 DIAGNOSIS — J90 Pleural effusion, not elsewhere classified: Secondary | ICD-10-CM | POA: Diagnosis not present

## 2020-01-10 DIAGNOSIS — L940 Localized scleroderma [morphea]: Secondary | ICD-10-CM | POA: Diagnosis not present

## 2020-01-10 DIAGNOSIS — E860 Dehydration: Secondary | ICD-10-CM | POA: Diagnosis not present

## 2020-01-15 DIAGNOSIS — L940 Localized scleroderma [morphea]: Secondary | ICD-10-CM | POA: Diagnosis not present

## 2020-01-15 DIAGNOSIS — J90 Pleural effusion, not elsewhere classified: Secondary | ICD-10-CM | POA: Diagnosis not present

## 2020-01-15 DIAGNOSIS — K222 Esophageal obstruction: Secondary | ICD-10-CM | POA: Diagnosis not present

## 2020-01-15 DIAGNOSIS — E871 Hypo-osmolality and hyponatremia: Secondary | ICD-10-CM | POA: Diagnosis not present

## 2020-01-15 DIAGNOSIS — K529 Noninfective gastroenteritis and colitis, unspecified: Secondary | ICD-10-CM | POA: Diagnosis not present

## 2020-01-15 DIAGNOSIS — I7300 Raynaud's syndrome without gangrene: Secondary | ICD-10-CM | POA: Diagnosis not present

## 2020-01-15 DIAGNOSIS — I959 Hypotension, unspecified: Secondary | ICD-10-CM | POA: Diagnosis not present

## 2020-01-15 DIAGNOSIS — R1310 Dysphagia, unspecified: Secondary | ICD-10-CM | POA: Diagnosis not present

## 2020-01-15 DIAGNOSIS — E860 Dehydration: Secondary | ICD-10-CM | POA: Diagnosis not present

## 2020-01-17 ENCOUNTER — Other Ambulatory Visit: Payer: Self-pay

## 2020-01-17 DIAGNOSIS — K529 Noninfective gastroenteritis and colitis, unspecified: Secondary | ICD-10-CM | POA: Diagnosis not present

## 2020-01-17 DIAGNOSIS — I7300 Raynaud's syndrome without gangrene: Secondary | ICD-10-CM | POA: Diagnosis not present

## 2020-01-17 DIAGNOSIS — R1310 Dysphagia, unspecified: Secondary | ICD-10-CM | POA: Diagnosis not present

## 2020-01-17 DIAGNOSIS — E860 Dehydration: Secondary | ICD-10-CM | POA: Diagnosis not present

## 2020-01-17 DIAGNOSIS — J90 Pleural effusion, not elsewhere classified: Secondary | ICD-10-CM | POA: Diagnosis not present

## 2020-01-17 DIAGNOSIS — E871 Hypo-osmolality and hyponatremia: Secondary | ICD-10-CM | POA: Diagnosis not present

## 2020-01-17 DIAGNOSIS — K222 Esophageal obstruction: Secondary | ICD-10-CM | POA: Diagnosis not present

## 2020-01-17 DIAGNOSIS — I959 Hypotension, unspecified: Secondary | ICD-10-CM | POA: Diagnosis not present

## 2020-01-17 DIAGNOSIS — L940 Localized scleroderma [morphea]: Secondary | ICD-10-CM | POA: Diagnosis not present

## 2020-01-17 NOTE — Patient Outreach (Signed)
[facility]) Care Management  [place]  01/17/2020   [name] December 02, 1956 [id number]  Subjective: Return call to patient.  She has a question about her bill and speech therapy.  She did not remember getting speech therapy. Reviewed hospital notes and patient did have speech therapy. Patient remembers now have a swallowing evaluation.  Patient states she is doing well and has been to her doctors for follow up.  She states she did not see the GI doctor again as she has been fine.  Patient states that she is eating well and has no problems with her stomach.  Patient states that home health last visit is today.  Patient states she has some problems affording her Breo.  Discussed [facility] pharmacy referral for medication assistance. Patient is agreeable. Patient denies any other needs.     Objective:   Encounter Medications:  Outpatient Encounter Medications as of 01/17/2020  Medication Sig  . acetaminophen (TYLENOL) 500 MG tablet Take 500 mg by mouth every 6 (six) hours as needed for mild pain.   [name] albuterol (PROAIR HFA) 108 (90 Base) MCG/ACT inhaler Inhale 2 puffs into the lungs every 6 (six) hours as needed for wheezing.  [name] albuterol (PROVENTIL) (2.5 MG/3ML) 0.083% nebulizer solution Take 3 mLs (2.5 mg total) by nebulization every 6 (six) hours as needed for wheezing or shortness of breath.  [name] amLODipine (NORVASC) 5 MG tablet Take 5 mg by mouth 2 (two) times daily.   . ferrous sulfate 325 (65 FE) MG EC tablet Take 1 tablet (325 mg total) by mouth daily with breakfast.  . fluticasone furoate-vilanterol (BREO ELLIPTA) 200-25 MCG/INH AEPB Inhale 1 puff into the lungs daily.  [name] lisinopril (ZESTRIL) 5 MG tablet Take 5 mg by mouth daily.  [name] omeprazole (PRILOSEC) 20 MG capsule Take 20 mg by mouth daily.  . predniSONE (DELTASONE) 10 MG tablet Take 2 tabs x 5 days  . [DISCONTINUED] famotidine (PEPCID) 20 MG tablet Take 20 mg by mouth 2 (two) times daily.     No  facility-administered encounter medications on file as of 01/17/2020.    Functional Status:  In your present state of health, do you have any difficulty performing the following activities: 12/19/2019 12/02/2019  Hearing? N N  Vision? N N  Difficulty concentrating or making decisions? N N  Walking or climbing stairs? N N  Dressing or bathing? N N  Doing errands, shopping? N N  Preparing Food and eating ? N -  Using the Toilet? N -  In the past six months, have you accidently leaked urine? N -  Do you have problems with loss of bowel control? N -  Managing your Medications? N -  Managing your Finances? N -  Housekeeping or managing your Housekeeping? N -  Some recent data might be hidden    Fall/Depression Screening: Fall Risk  12/19/2019  Falls in the past year? 0   PHQ 2/9 Scores 12/19/2019  PHQ - 2 Score 1    Assessment: Patient continues to recover post colitis.    Plan:  [facility] CM Care Plan Problem One     Most Recent Value  Care Plan Problem One  Recent hospitalization colitis  Role Documenting the Problem One  Care Management Telephonic Coordinator  Care Plan for Problem One  Active  THN Long Term Goal   Patient will not experience unplanned hospital readmission within the next 31 days.  THN Long Term Goal Start Date  12/19/19  [facility] CM  Short Term Goal #1   Patient will make follow up appointment with GI and Kidney doctors within 21 days  THN CM Short Term Goal #1 Start Date  12/19/19  [facility] CM Short Term Goal #1 Met Date  01/17/20  Interventions for Short Term Goal #1  Patient felt she did not need to see GI  THN CM Short Term Goal #2   Patient will be able to verbalize understanding of colitis disease process and recovery within 29 days.  THN CM Short Term Goal #2 Start Date  12/19/19  [facility] CM Short Term Goal #2 Met Date  01/17/20  Interventions for Short Term Goal #2  Patient reports she continues to watch her diet and denies any further problems with colitis.       RN  CM will refer to pharmacy for medication assistance. RN CM will contact patient in the month of February and patient agreeable.    Jone Baseman, RN, MSN [place] Management Care Management Coordinator Direct Line [phone] Cell [phone] Toll Free: [phone]  Fax: [phone]

## 2020-01-18 ENCOUNTER — Other Ambulatory Visit: Payer: Self-pay | Admitting: Pharmacist

## 2020-01-18 NOTE — Patient Outreach (Signed)
[facility]) Care Management  [facility]  01/18/2020  [name] 11-23-1956 [id number]   Reason for referral: Medication Assistance with [name]  Referral source: [facility] RN Current insurance: [employer]  Outreach:  Unsuccessful telephone call attempt #1 to patient.   Spoke with family member who requests that I call back tomorrow morning as patient is not feeling well today and just fell asleep.    Plan:  Will f/u again with patient tomorrow morning  Haynes Hoehn, PharmD, [facility] Clinical Pharmacist [facility] [employer] [phone]

## 2020-01-19 ENCOUNTER — Ambulatory Visit: Payer: Self-pay | Admitting: Pharmacist

## 2020-01-19 ENCOUNTER — Other Ambulatory Visit: Payer: Self-pay | Admitting: Pharmacist

## 2020-01-19 NOTE — Patient Outreach (Signed)
[facility]) Care Management  [place]   01/19/2020  [name] 12-Mar-1956 [id number]  Reason for referral: Medication Assistance with Adair Patter  Referral source: [facility] RN Current insurance: [facility]  PMHx includes but not limited to:  Raynaud's disease, CKD-III, anemia, asthma, scleroderma, colitis, GERD, recent hospitalization 12/4-12/11 for viral gastroenteritis, left pleural effusion, and possible aspiration PNA s/p thoracentesis x2    Outreach:  Successful telephone call with patient.  HIPAA identifiers verified.   Subjective:  Patient reports she is feeling "fine" today and is currently using a sample of Breo from pulmonary office.  She states she was told copay would be $300 which she cannot afford.  She reports she is on the 200-64mg/inh day/dose.  She reports in the past when she had to use a sample with the lower dose, she had worsening breathing.  She also reports she has a nebulizer machine but it is "almost 63years old."  She states she needs new tubing and machine parts and would love to have a new machine.  Objective: The 10-year ASCVD risk score (Mikey BussingDC Jr., et al., 2013) is: 2.7%   Values used to calculate the score:     Age: [age]years     Sex: Female     Is Non-Hispanic African American: No     Diabetic: No     Tobacco smoker: No     Systolic Blood Pressure: 94 mmHg     Is BP treated: Yes     HDL Cholesterol: 47 mg/dL     Total Cholesterol: 157 mg/dL  Lab Results  Component Value Date   CREATININE 1.02 (H) 12/08/2019   CREATININE 0.99 12/05/2019   CREATININE 1.26 (H) 12/04/2019    No results found for: HGBA1C  Lipid Panel     Component Value Date/Time   CHOL 157 01/14/2018 0851   TRIG 96 01/14/2018 0851   HDL 47 01/14/2018 0851   CHOLHDL 3.3 01/14/2018 0851   LDLCALC 91 01/14/2018 0851    BP Readings from Last 3 Encounters:  12/26/19 94/60  12/14/19 90/62  12/08/19 (!) 98/57    Allergies  Allergen Reactions   . Avelox [Moxifloxacin Hcl In Nacl]     Caused tachycardia  . Codeine Hives and Other (See Comments)    Hallucination,   . Penicillins Hives    No anaphylaxis/SJS symptoms, > 10 yr ago, no hospitalization required States she has tolerated amoxicillin     Medications Reviewed Today    Reviewed by WMartyn Ehrich NP (Nurse Practitioner) on 12/26/19 at 1716  Med List Status: <None>  Medication Order Taking? Sig Documenting Provider Last Dose Status Informant  acetaminophen (TYLENOL) 500 MG tablet [id number] Take 500 mg by mouth every 6 (six) hours as needed for mild pain.  [provider] Taking Active Self  albuterol (PROAIR HFA) 108 (90 Base) MCG/ACT inhaler [id number] Inhale 2 puffs into the lungs every 6 (six) hours as needed for wheezing. Tysinger, DCamelia Eng PA-C Taking Active   albuterol (PROVENTIL) (2.5 MG/3ML) 0.083% nebulizer solution [id number] Take 3 mLs (2.5 mg total) by nebulization every 6 (six) hours as needed for wheezing or shortness of breath. Tysinger, DCamelia Eng PA-C Taking Active Self  amLODipine (NORVASC) 5 MG tablet [id number] Take 5 mg by mouth 2 (two) times daily.  [provider] Taking Active Self           Med Note (Nyoka Cowden FELICIA D   Tue Jun 23, 2016 10:28 PM)          Discontinued 03/18/12 0948 (Error)   ferrous sulfate 325 (65 FE) MG EC tablet [id number] Yes Take 1 tablet (325 mg total) by mouth daily with breakfast. Alma Friendly, MD Taking Active   fluticasone furoate-vilanterol (BREO ELLIPTA) 200-25 MCG/INH AEPB [id number] Yes Inhale 1 puff into the lungs daily. Tysinger, Camelia Eng, PA-C Taking Active   lisinopril (ZESTRIL) 5 MG tablet [id number] Yes Take 5 mg by mouth daily. [provider] Taking Active   omeprazole (PRILOSEC) 20 MG capsule [id number] Yes Take 20 mg by mouth daily. [provider] Taking Active   predniSONE (DELTASONE) 10 MG tablet [id number]  Take 2 tabs x 5 days Martyn Ehrich, NP   Active           Assessment: Drugs sorted by system:  Hematologic: ferrous sulfate  Cardiovascular: amlodipine, lisinopril  Pulmonary/Allergy: albuterol inhaler and nebulizer, fluticasone-vilanterol inhaler  Gastrointestinal: omeprazole  Medication Review Findings:  . Amlodipine usual dose is once daily, patient currently taking BID however this may be prescribed off-label for Reynauds, suggest monitoring BP closely   Medication Assistance Findings:  Medication assistance needs identified: Elizebeth Koller  Extra Help:  May be eligible for Full Extra Help Low Income Subsidy based on reported income and assets  Patient Assistance Programs: Adair Patter made by [place] requirement met: Yes o Out-of-pocket prescription expenditure met:   No ($600 ) - Patient has not met application requirements to apply for this program at this time.   - Reviewed program with patient.  Can substitute to [facility] and apply via [employer] if Extra Help denied OR see if there is substitution on [place] with lower Tier / cost  Plan: . Assisted patient with applying for Extra Help.  . Will route note to pulmonology office re: nebulizer needs.   . Will f/u with patient in 4 weeks re: Extra Help   Ralene Bathe, PharmD, [place] [phone]

## 2020-01-22 ENCOUNTER — Ambulatory Visit: Payer: Medicare PPO

## 2020-01-31 ENCOUNTER — Other Ambulatory Visit: Payer: Self-pay

## 2020-01-31 NOTE — Patient Outreach (Signed)
[facility]) Care Management  01/31/2020  [name] Jul 11, 1956 [id number]   Telephone call to patient for disease management follow up. No answer.  HIPAA compliant voice message left.    Plan: RN CM will attempt again next month and send a letter.   [name], RN, MSN [facility] Care Management Care Management Coordinator Direct Line [phone] Cell [phone] Toll Free: [phone]  Fax: [phone]

## 2020-02-08 ENCOUNTER — Ambulatory Visit (INDEPENDENT_AMBULATORY_CARE_PROVIDER_SITE_OTHER): Payer: Medicare PPO

## 2020-02-08 ENCOUNTER — Ambulatory Visit: Payer: Medicare PPO | Admitting: Emergency Medicine

## 2020-02-08 ENCOUNTER — Encounter: Payer: Self-pay | Admitting: Emergency Medicine

## 2020-02-08 ENCOUNTER — Other Ambulatory Visit: Payer: Self-pay

## 2020-02-08 VITALS — BP 118/60 | HR 82 | Ht 64.0 in | Wt 102.0 lb

## 2020-02-08 DIAGNOSIS — R9389 Abnormal findings on diagnostic imaging of other specified body structures: Secondary | ICD-10-CM

## 2020-02-08 DIAGNOSIS — J849 Interstitial pulmonary disease, unspecified: Secondary | ICD-10-CM

## 2020-02-08 DIAGNOSIS — J90 Pleural effusion, not elsewhere classified: Secondary | ICD-10-CM

## 2020-02-08 DIAGNOSIS — J45909 Unspecified asthma, uncomplicated: Secondary | ICD-10-CM | POA: Diagnosis not present

## 2020-02-08 NOTE — Assessment & Plan Note (Signed)
Continue Breo.  Very rare albuterol use.  Try to continue to treat exacerbating factors including GERD, rhinitis.

## 2020-02-08 NOTE — Assessment & Plan Note (Signed)
Interstitial disease, not in a UIP or NSIP pattern so difficult to ascribe to her scleroderma.  We have suspected that her scarring is related to recurrent pneumonias.  She was admitted with another pneumonia in December 2020.  We will plan to repeat her CT scan of the chest to assess for resolution of her groundglass infiltrates, degree of residual interstitial disease/scar following this pneumonia.  She will need to continue her aspiration, reflux precautions.

## 2020-02-08 NOTE — Assessment & Plan Note (Signed)
Resolved on chest x-ray today.  She required thoracentesis twice during December when she was admitted for pneumonia.  The fluid was transudative

## 2020-02-08 NOTE — Progress Notes (Signed)
Subjective:    Patient ID: [name], female    DOB: 10-07-1956, 64 y.o.   MRN: [id number]  HPI 64 year old never smoker with a history of scleroderma and Raynaud's followed by, hypertension on lisinopril, chronic kidney disease, GERD, and asthma since 1981. She is off of immunosuppression, has been for many years. Previously on MTX.  She is referred today for evaluation of an abnormal CT scan of the chest.  She has a hx PNA's before, last 2009. She underwent screening CT chest 06/15/2019 that I reviewed.  This shows mild hyperinflation, peripheral scattered upper lobe predominant fibrotic change some with nodular component.  TTE from 06/16/2019 reviewed, no evidence for PAH in setting of her scleroderma.   Has been fairly well. Uses Breo, minimal albuterol use.  She averages about 1 exacerbation annually  Cleda Daub 2010 from here >> FEV1 65% predicted.   ROV 02/08/20 --64 year old woman with a history of scleroderma and Raynaud's (previously on methotrexate), hypertension, chronic kidney disease, GERD and longstanding asthma with documented obstruction by spirometry 2010.  Multiple prior pneumonias, suspected sequela from aspiration with some scar.  She has been seen for an abnormal CT chest that showed this but no evidence of a UIP or NSIP pattern.  She was unfortunately admitted between 12/4 and 12/11 for possible aspiration pneumonia, viral gastroenteritis.  She had a left effusion that was tapped twice and was found to be transudative.  She completed a course of oral Augmentin.  Repeat chest x-ray done today reviewed by me shows hyperinflation, no pneumothorax or effusion, stable interstitial changes consistent with scarring. Her breathing is much improved - back to baseline. She is on Breo, uses albuterol very rarely now.   Last Ct chest was 12/05/2019, review and shows B GGI consistent with pneumonitis / PNA.    Review of Systems  Constitutional: Negative for fever and unexpected weight  change.  HENT: Negative for congestion, dental problem, ear pain, nosebleeds, postnasal drip, rhinorrhea, sinus pressure, sneezing, sore throat and trouble swallowing.   Eyes: Negative for redness and itching.  Respiratory: Negative for cough, chest tightness, shortness of breath and wheezing.   Cardiovascular: Negative for palpitations and leg swelling.  Gastrointestinal: Negative for nausea and vomiting.  Genitourinary: Negative for dysuria.  Musculoskeletal: Negative for joint swelling.  Skin: Negative for rash.  Neurological: Negative for headaches.  Hematological: Does not bruise/bleed easily.  Psychiatric/Behavioral: Negative for dysphoric mood. The patient is not nervous/anxious.     Past Medical History:  Diagnosis Date  . Allergic rhinitis   . Asthma   . Atherosclerosis   . Chronic kidney disease (CKD), stage III (moderate)   . Esophageal stricture   . GERD (gastroesophageal reflux disease)   . History of anemia due to chronic kidney disease   . History of colitis 2017  . Hypertension   . Metabolic bone disease   . Raynaud disease   . Scleroderma (HCC)      Family History  Problem Relation Age of Onset  . Heart failure Mother   . Hepatitis Mother        C  . Dementia Mother   . COPD Mother        emphysema  . Cancer Father        lung with mets to brain  . Diabetes Sister   . Hepatitis Brother   . Cancer Sister        leukemia  . Colon cancer Neg Hx      Social History  Socioeconomic History  . Marital status: Single    Spouse name: Not on file  . Number of children: Not on file  . Years of education: Not on file  . Highest education level: Not on file  Occupational History  . Not on file  Tobacco Use  . Smoking status: Never Smoker  . Smokeless tobacco: Never Used  Substance and Sexual Activity  . Alcohol use: No  . Drug use: No  . Sexual activity: Not on file  Other Topics Concern  . Not on file  Social History Narrative  . Not on file    Social Determinants of Health   Financial Resource Strain:   . Difficulty of Paying Living Expenses: Not on file  Food Insecurity: No Food Insecurity  . Worried About Charity fundraiser in the Last Year: Never true  . Ran Out of Food in the Last Year: Never true  Transportation Needs: No Transportation Needs  . Lack of Transportation (Medical): No  . Lack of Transportation (Non-Medical): No  Physical Activity:   . Days of Exercise per Week: Not on file  . Minutes of Exercise per Session: Not on file  Stress:   . Feeling of Stress : Not on file  Social Connections:   . Frequency of Communication with Friends and Family: Not on file  . Frequency of Social Gatherings with Friends and Family: Not on file  . Attends Religious Services: Not on file  . Active Member of Clubs or Organizations: Not on file  . Attends Archivist Meetings: Not on file  . Marital Status: Not on file  Intimate Partner Violence:   . Fear of Current or Ex-Partner: Not on file  . Emotionally Abused: Not on file  . Physically Abused: Not on file  . Sexually Abused: Not on file     Allergies  Allergen Reactions  . Avelox [Moxifloxacin Hcl In Nacl]     Caused tachycardia  . Codeine Hives and Other (See Comments)    Hallucination,   . Penicillins Hives    No anaphylaxis/SJS symptoms, > 10 yr ago, no hospitalization required States she has tolerated amoxicillin      Outpatient Medications Prior to Visit  Medication Sig Dispense Refill  . acetaminophen (TYLENOL) 500 MG tablet Take 500 mg by mouth every 6 (six) hours as needed for mild pain.     [name] albuterol (PROAIR HFA) 108 (90 Base) MCG/ACT inhaler Inhale 2 puffs into the lungs every 6 (six) hours as needed for wheezing. 18 g 1  . albuterol (PROVENTIL) (2.5 MG/3ML) 0.083% nebulizer solution Take 3 mLs (2.5 mg total) by nebulization every 6 (six) hours as needed for wheezing or shortness of breath. 75 mL 1  . amLODipine (NORVASC) 5 MG tablet  Take 5 mg by mouth 2 (two) times daily.     . fluticasone furoate-vilanterol (BREO ELLIPTA) 200-25 MCG/INH AEPB Inhale 1 puff into the lungs daily. 1 each 3  . lisinopril (ZESTRIL) 5 MG tablet Take 5 mg by mouth daily.    [name] omeprazole (PRILOSEC) 20 MG capsule Take 20 mg by mouth daily.    . ferrous sulfate 325 (65 FE) MG EC tablet Take 1 tablet (325 mg total) by mouth daily with breakfast. 30 tablet 0   No facility-administered medications prior to visit.        Objective:   Physical Exam Vitals:   02/08/20 1141 02/08/20 1142  BP:  118/60  Pulse:  82  SpO2:  97%  Weight: 102 lb (46.3 kg)   Height: 5\' 4"  (1.626 m)    Gen: Pleasant, thin, in no distress,  normal affect  ENT: No lesions,  mouth clear,  oropharynx clear, no postnasal drip  Neck: No JVD, no stridor  Lungs: No use of accessory muscles, no crackles.  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  We have suspected that her scarring is related to recurrent pneumonias.  She was admitted with another pneumonia in December 2020.  We will plan to repeat her CT scan of the chest to assess for resolution of her groundglass infiltrates, degree of residual interstitial disease/scar following this pneumonia.  She will need to continue her aspiration, reflux precautions.  Intrinsic asthma Continue Breo.  Very rare albuterol use.  Try to continue to treat exacerbating factors including GERD, rhinitis.  Pleural effusion Resolved on chest x-ray today.  She required thoracentesis twice during December when she was admitted for  pneumonia.  The fluid was transudative  January, MD, PhD 02/08/2020, 1:25 PM [facility] [phone] or if no answer [phone]

## 2020-02-08 NOTE — Patient Instructions (Signed)
We will plan to repeat your CT scan of the chest in June 2021 Please continue your Breo as you have been taking it.  Rinse and gargle after using. Keep your albuterol available use 2 puffs if you have shortness of breath, chest tightness, wheezing. Practice swallowing precautions to avoid aspiration or getting choked. Follow with Dr Delton Coombes in June after your CT scan so that we can review together.  Call us sooner if you have any trouble with your breathing, chest discomfort.

## 2020-02-19 ENCOUNTER — Other Ambulatory Visit: Payer: Self-pay | Admitting: Pharmacist

## 2020-02-19 ENCOUNTER — Ambulatory Visit: Payer: Self-pay | Admitting: Pharmacist

## 2020-02-19 NOTE — Patient Outreach (Signed)
[facility]) Care Management  [facility] 02/19/2020  Caitlyn STENERSON [birth year] [id number]  F/u call to patient regarding Extra Help.  Successful call with [name].  She reports she did receive approval letter from [facility] for Full Extra Help.  She has not requested a refill of Breo Ellipta inhaler yet as she still has about 10 days left of sample supply.    3-way call to [facility] pharmacy.  Refill for [facility] confirmed as $9.20.  Pharmacy will get it ready today.    Reviewed with patient that she can also request a 3 month supply to receive same co-pay for [facility] as 1 month supply.  She voiced understanding.   Patient has not had new nebulizer ordered for her yet.  I encouraged her to reach out to pulmonary office about this.   We also discussed cyproheptadine as patient was on this in the past.  She is out of medication but was told by [facility] this is no longer on formulary.  She is going to reach out to rheumatology provider who has prescribed this in the past for her as she is very cautious about any substitutions.    Plan: Will route note to pulmonologist re: Extra Help benefits and nebulizer request.    Will close [facility] pharmacy case but am happy to assist in the future again as needed.   Haynes Hoehn, PharmD, [facility] Clinical Pharmacist [facility] [employer] [phone]

## 2020-02-23 IMAGING — DX DG CHEST 1V PORT
1 series · 1 of 1 positions shown · non-contrast
Comparison: 12/02/2019

CLINICAL DATA: Shortness of breath.

EXAM:
PORTABLE CHEST 1 VIEW

[chest ap]
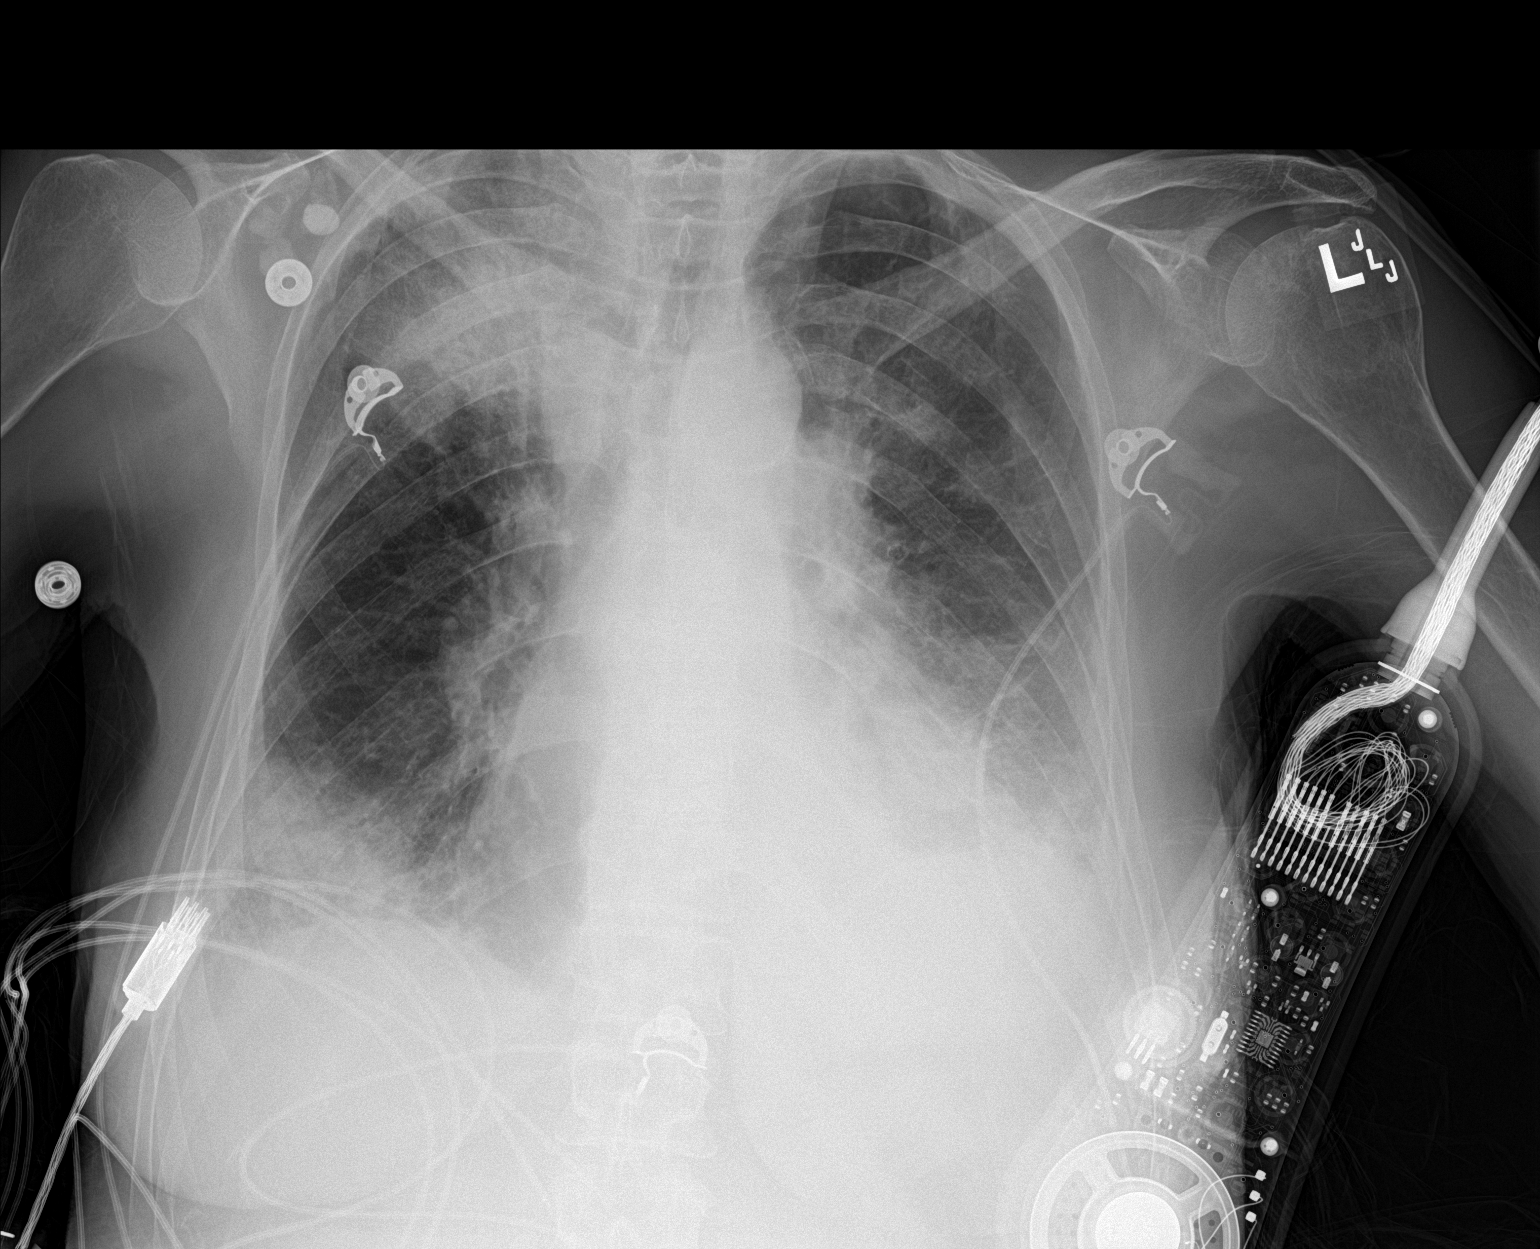

[1 of 1 positions shown; findings below may reference images not displayed]

FINDINGS: Stable cardiomegaly. Airspace disease in right upper lobe and right
lung base shows no significant change. Mild increase in airspace
opacity is seen in the left lung base. Probable small left pleural
effusion noted.
IMPRESSION: 1. No significant change in right upper and lower lobe airspace
disease.
2. Mild increase in left lower lobe airspace disease. Probable small
left pleural effusion.

## 2020-02-24 IMAGING — US US THORACENTESIS ASP PLEURAL SPACE W/IMG GUIDE
1 series · 8 of 8 positions shown · non-contrast
Comparison: none

INDICATION: History of diarrhea/colitis, scleroderma, acute on chronic kidney
disease, dyspnea, large left pleural effusion. Request for
therapeutic left thoracentesis.

[Series 1: us thoracentesis asp pleural space w/img guide · 8 of 8 slices shown]
[im 1/8]
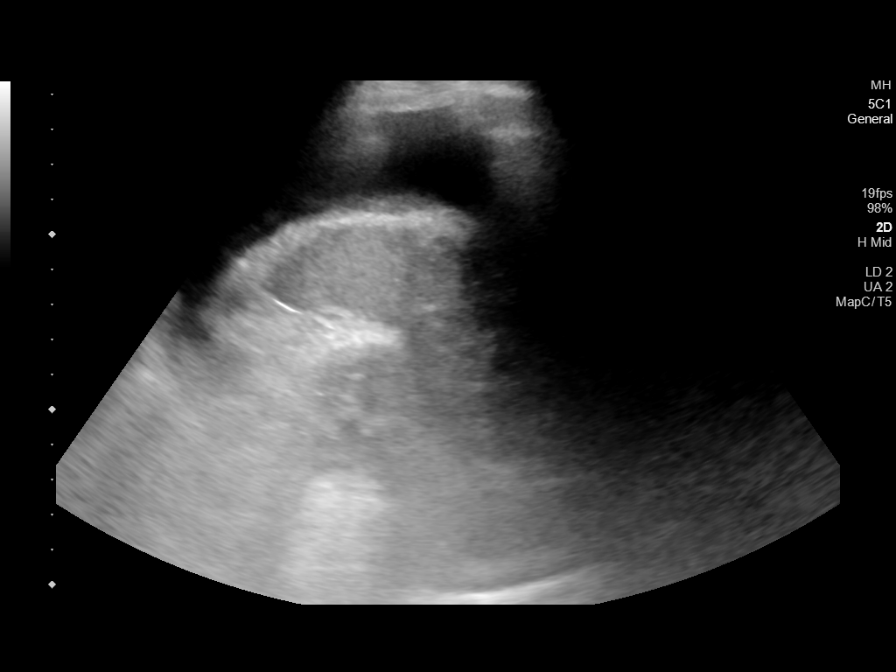
[im 2/8]
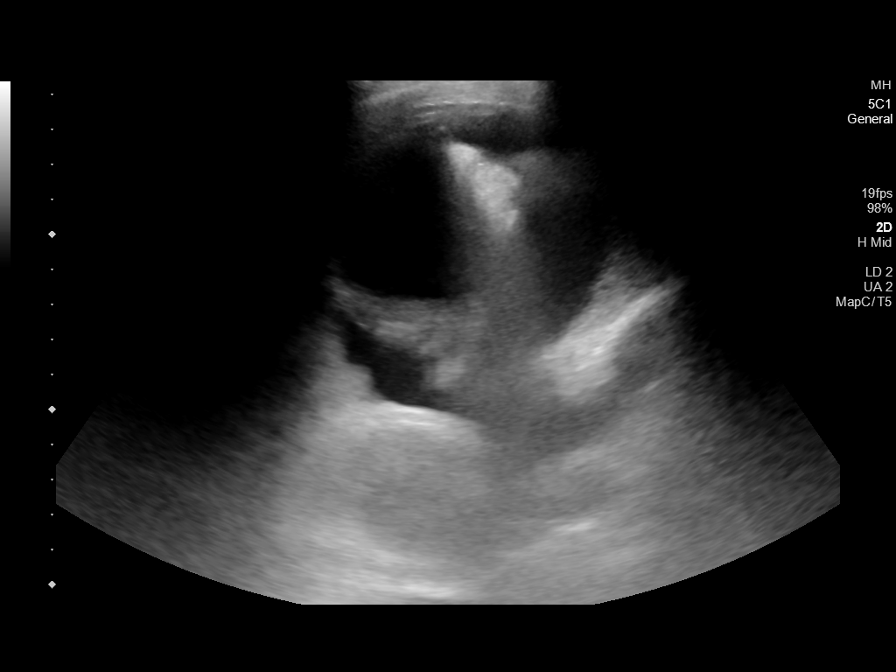
[im 3/8]
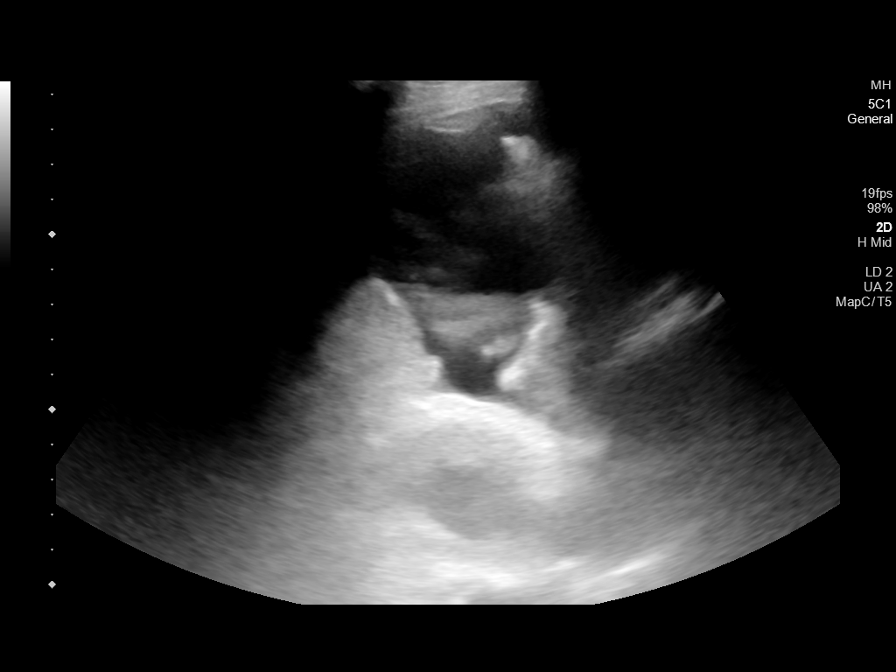
[im 4/8]
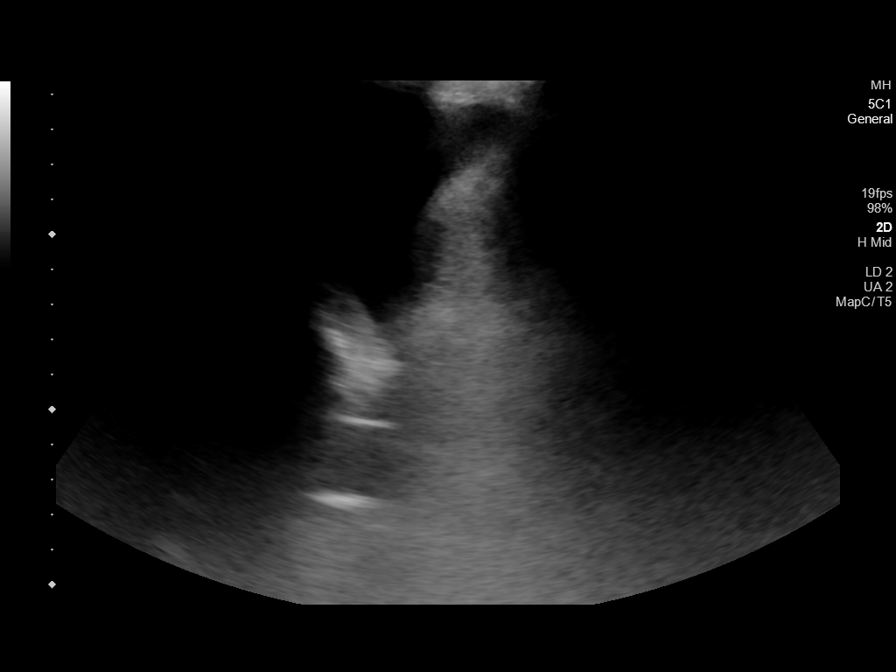
[im 5/8]
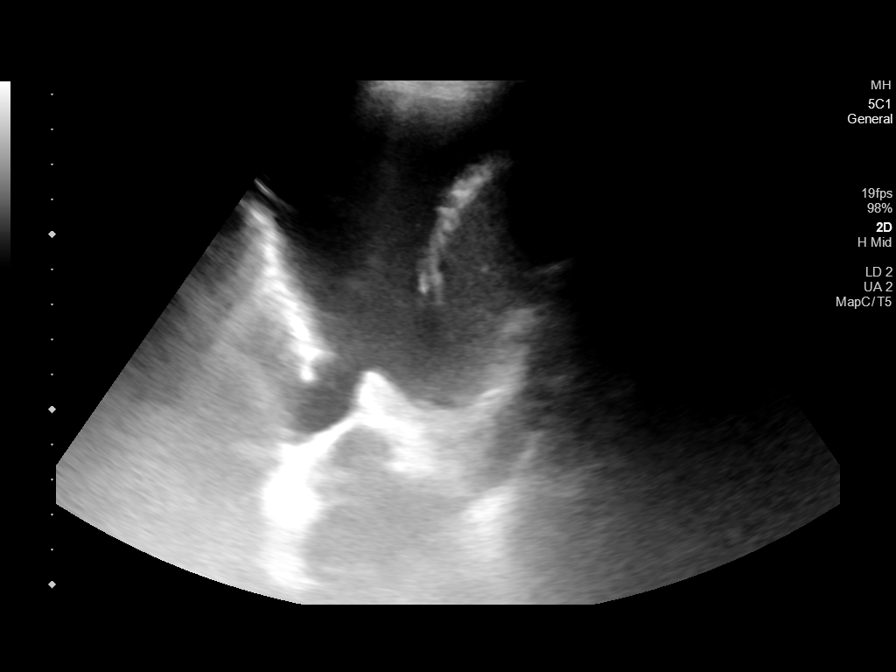
[im 6/8]
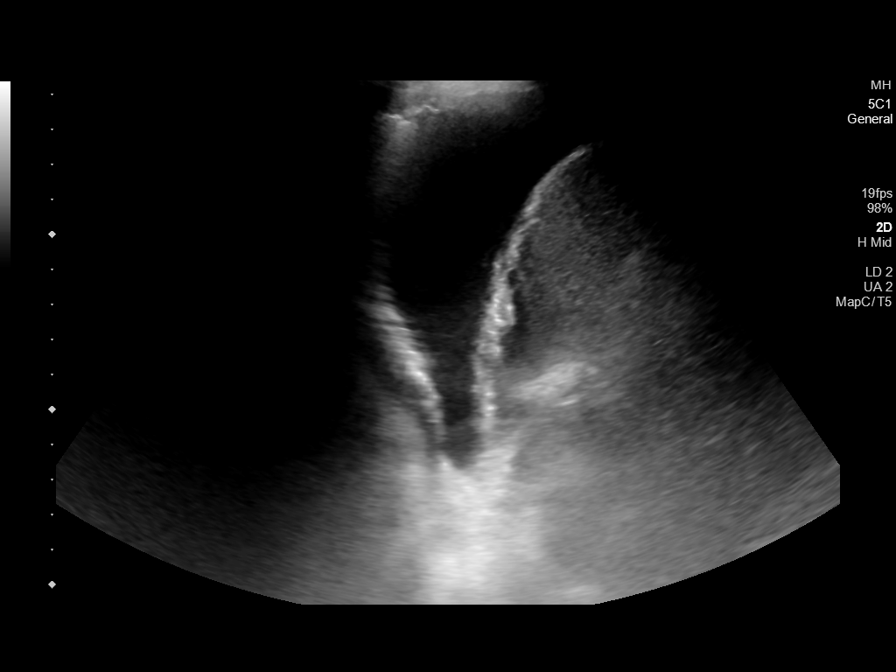
[im 7/8]
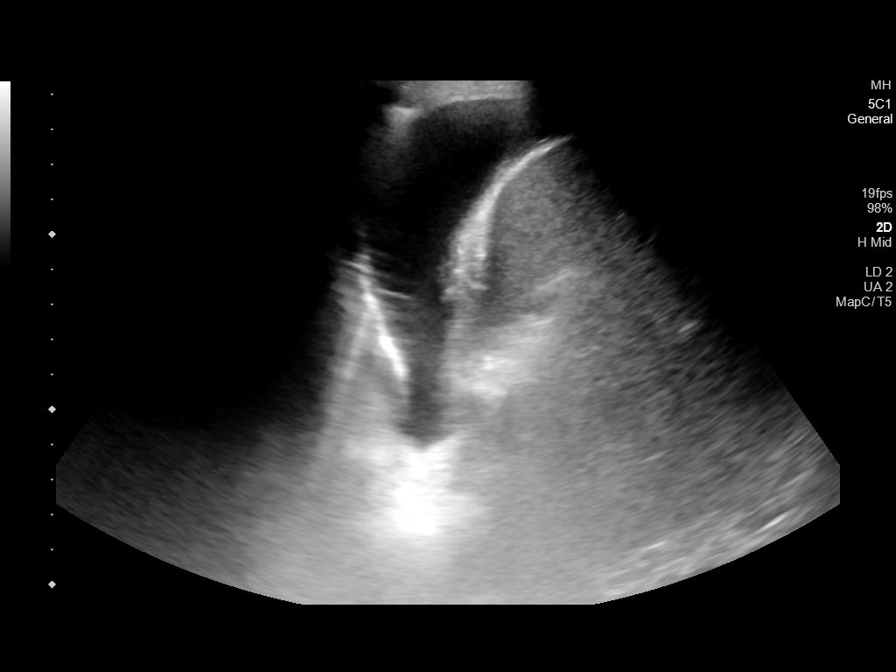
[im 8/8]
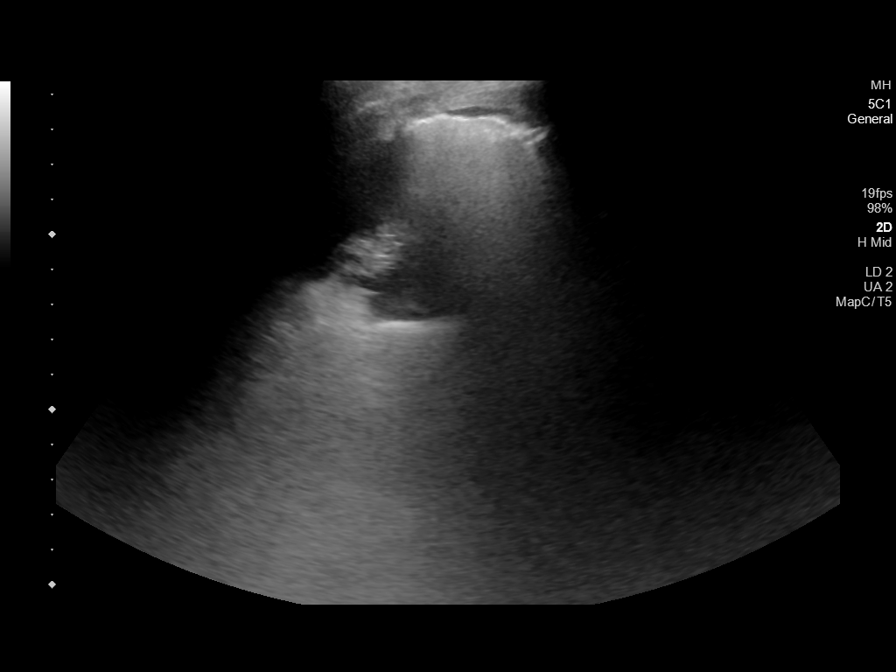

[8 of 8 positions shown; findings below may reference images not displayed]

EXAM:
ULTRASOUND GUIDED LEFT THORACENTESIS

MEDICATIONS:
1% lidocaine 8 mL

COMPLICATIONS:
None immediate.

PROCEDURE:
An ultrasound guided thoracentesis was thoroughly discussed with the
patient and questions answered. The benefits, risks, alternatives
and complications were also discussed. The patient understands and
wishes to proceed with the procedure. Written consent was obtained.

Ultrasound was performed to localize and mark an adequate pocket of
fluid in the left chest. The area was then prepped and draped in the
normal sterile fashion. 1% Lidocaine was used for local anesthesia.
Under ultrasound guidance a 6 Fr Safe-T-Centesis catheter was
introduced. Thoracentesis was performed. The catheter was removed
and a dressing applied.
FINDINGS: A total of approximately 550 mL of clear yellow fluid was removed.
IMPRESSION: Successful ultrasound guided left thoracentesis yielding 550 mL of
pleural fluid.

No pneumothorax on post-procedure chest x-ray.

## 2020-02-27 IMAGING — DX DG CHEST 1V PORT
1 series · 1 of 1 positions shown · non-contrast
Comparison: 12/04/2019

CLINICAL DATA: Dyspnea.

EXAM:
PORTABLE CHEST 1 VIEW

[chest ap]
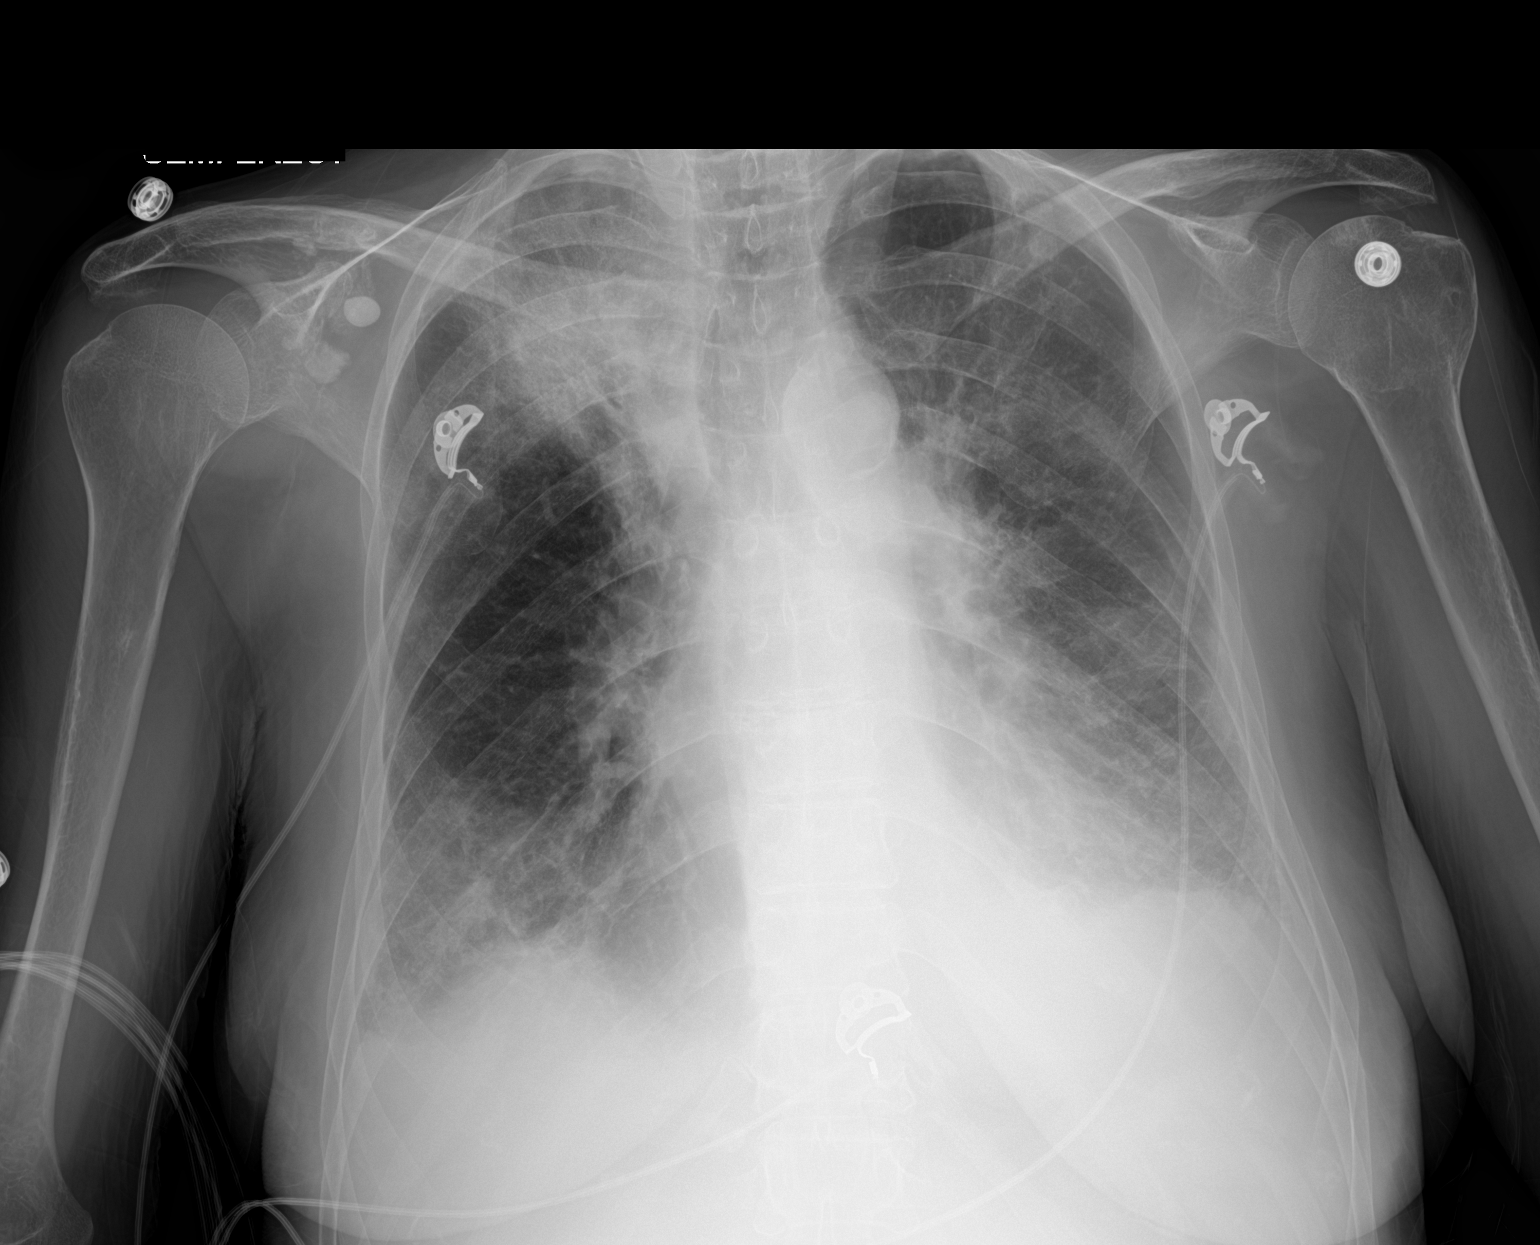

[1 of 1 positions shown; findings below may reference images not displayed]

FINDINGS: Lungs are adequately inflated demonstrate persistent consolidation
over the right upper lobe without significant change. Mild hazy
bibasilar opacification as well as mild hazy prominence of the
perihilar vessels unchanged. Small bilateral pleural effusions
unchanged. Borderline stable cardiomegaly. Remainder of the exam is
unchanged.
IMPRESSION: 1. Stable airspace consolidation over the right upper lobe likely
pneumonia. Stable bibasilar opacification which may be due to
infection or atelectasis. Stable small bilateral pleural effusions.

2. Borderline stable cardiomegaly with suggestion of minimal
vascular congestion.

## 2020-02-28 ENCOUNTER — Other Ambulatory Visit: Payer: Self-pay

## 2020-02-28 NOTE — Patient Outreach (Signed)
[name]) Care Management  [name]  02/28/2020   [name] [birth year] [id number]  Subjective: Telephone call to patient for disease management follow up.  Patient reports she is doing fine.  She states she is staying safe.  Discussed COVID-19 and vaccine.  She is considering the [employer] vaccine for [employer].  Patient reports that her blood pressure is doing good with last check 100/62.  Discussed blood pressure control. Patient has upcoming appointment with her Rheumatologist next week.  Patient was able to get her Breo with help of [facility] pharmacist and patient is grateful.  Patient denies any concerns.   Objective:   Encounter Medications:  Outpatient Encounter Medications as of 02/28/2020  Medication Sig  . acetaminophen (TYLENOL) 500 MG tablet Take 500 mg by mouth every 6 (six) hours as needed for mild pain.   [name] albuterol (PROAIR HFA) 108 (90 Base) MCG/ACT inhaler Inhale 2 puffs into the lungs every 6 (six) hours as needed for wheezing.  [name] albuterol (PROVENTIL) (2.5 MG/3ML) 0.083% nebulizer solution Take 3 mLs (2.5 mg total) by nebulization every 6 (six) hours as needed for wheezing or shortness of breath.  [name] amLODipine (NORVASC) 5 MG tablet Take 5 mg by mouth 2 (two) times daily.   . ferrous sulfate 325 (65 FE) MG EC tablet Take 1 tablet (325 mg total) by mouth daily with breakfast.  . fluticasone furoate-vilanterol (BREO ELLIPTA) 200-25 MCG/INH AEPB Inhale 1 puff into the lungs daily.  [name] lisinopril (ZESTRIL) 5 MG tablet Take 5 mg by mouth daily.  [name] omeprazole (PRILOSEC) 20 MG capsule Take 20 mg by mouth daily.  . [DISCONTINUED] famotidine (PEPCID) 20 MG tablet Take 20 mg by mouth 2 (two) times daily.     No facility-administered encounter medications on file as of 02/28/2020.    Functional Status:  In your present state of health, do you have any difficulty performing the following activities: 12/19/2019 12/02/2019  Hearing? N N  Vision? N N   Difficulty concentrating or making decisions? N N  Walking or climbing stairs? N N  Dressing or bathing? N N  Doing errands, shopping? N N  Preparing Food and eating ? N -  Using the Toilet? N -  In the past six months, have you accidently leaked urine? N -  Do you have problems with loss of bowel control? N -  Managing your Medications? N -  Managing your Finances? N -  Housekeeping or managing your Housekeeping? N -  Some recent data might be hidden    Fall/Depression Screening: Fall Risk  01/17/2020 12/19/2019  Falls in the past year? 0 0   PHQ 2/9 Scores 12/19/2019  PHQ - 2 Score 1    Assessment: Patient continues to manage chronic illnesses. No recent hospitalizations.   Plan:  [facility] CM Care Plan Problem One     Most Recent Value  Care Plan Problem One  Recent hospitalization colitis  Role Documenting the Problem One  Care Management Telephonic Coordinator  Care Plan for Problem One  Active  [facility] Long Term Goal   Patient will not experience unplanned hospital readmission within the next 31 days.  [facility] Long Term Goal Start Date  12/19/19  [facility] Long Term Goal Met Date  02/28/20  [facility] CM Short Term Goal #1   Over the next 30 days, patient will demonstrate and/or verbalize understanding of self-health management for HTN.   [facility] CM Short Term Goal #1 Start Date  02/28/20  Interventions for Short Term Goal #1  Over the next 90 days, patient will demonstrate and/or verbalize understanding of self-health management for HTN.      RN CM will contact patient in May and patient agreeable.  Caitlyn Baseman, RN, MSN [place] Management Care Management Coordinator Direct Line [phone] Cell [phone] Toll Free: [phone]  Fax: [phone]

## 2020-03-07 DIAGNOSIS — I7300 Raynaud's syndrome without gangrene: Secondary | ICD-10-CM | POA: Diagnosis not present

## 2020-03-07 DIAGNOSIS — D649 Anemia, unspecified: Secondary | ICD-10-CM | POA: Diagnosis not present

## 2020-03-07 DIAGNOSIS — M349 Systemic sclerosis, unspecified: Secondary | ICD-10-CM | POA: Diagnosis not present

## 2020-03-07 DIAGNOSIS — J45909 Unspecified asthma, uncomplicated: Secondary | ICD-10-CM | POA: Diagnosis not present

## 2020-03-07 DIAGNOSIS — N189 Chronic kidney disease, unspecified: Secondary | ICD-10-CM | POA: Diagnosis not present

## 2020-03-07 DIAGNOSIS — M79643 Pain in unspecified hand: Secondary | ICD-10-CM | POA: Diagnosis not present

## 2020-03-17 IMAGING — DX DG CHEST 2V
2 series · 2 of 2 positions shown · non-contrast
Comparison: CT chest 12/05/2019. Single-view of the chest
12/04/2019 and 12/07/2019.

CLINICAL DATA: History of pneumonia.  Subsequent encounter.

EXAM:
CHEST - 2 VIEW

[chest pa]
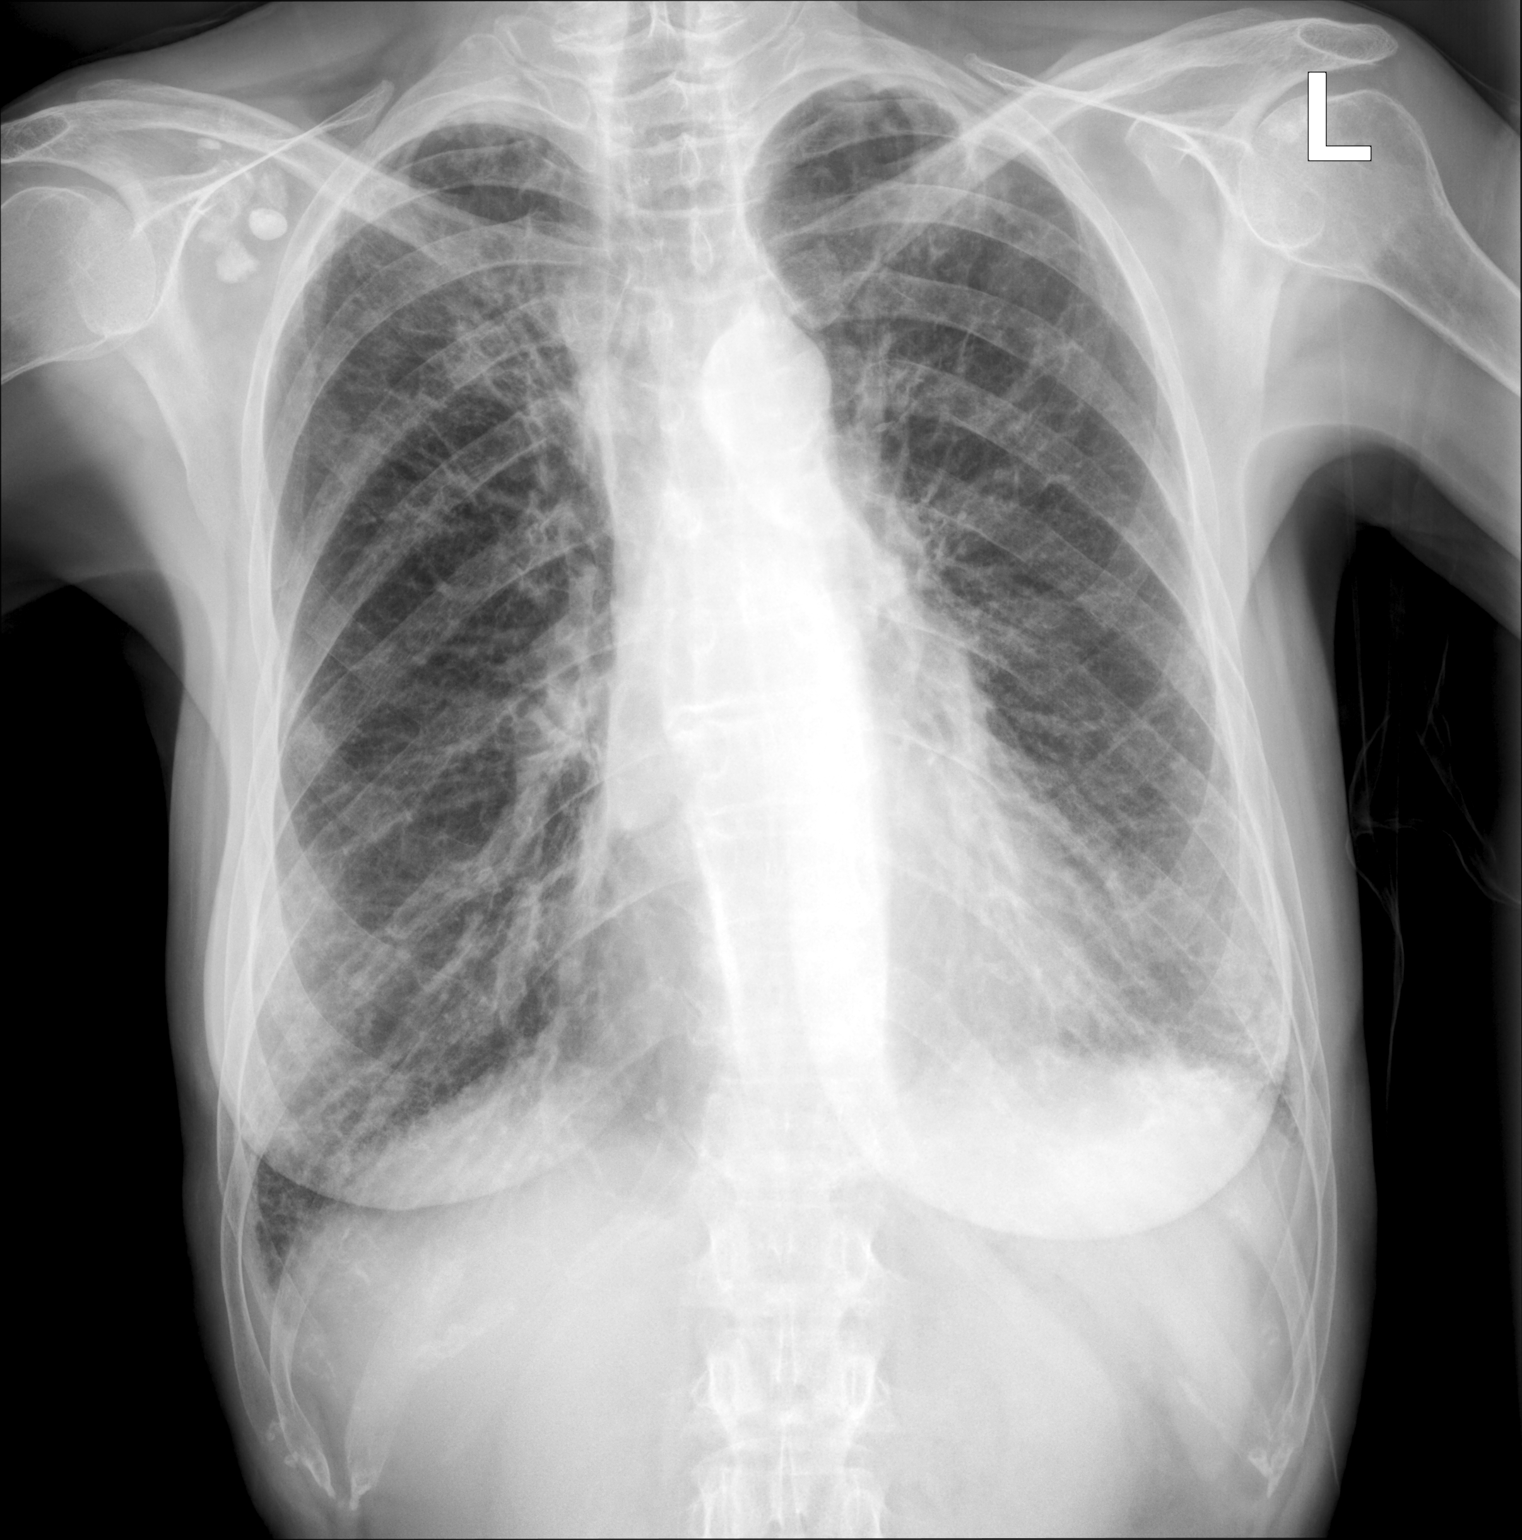

[chest lat]
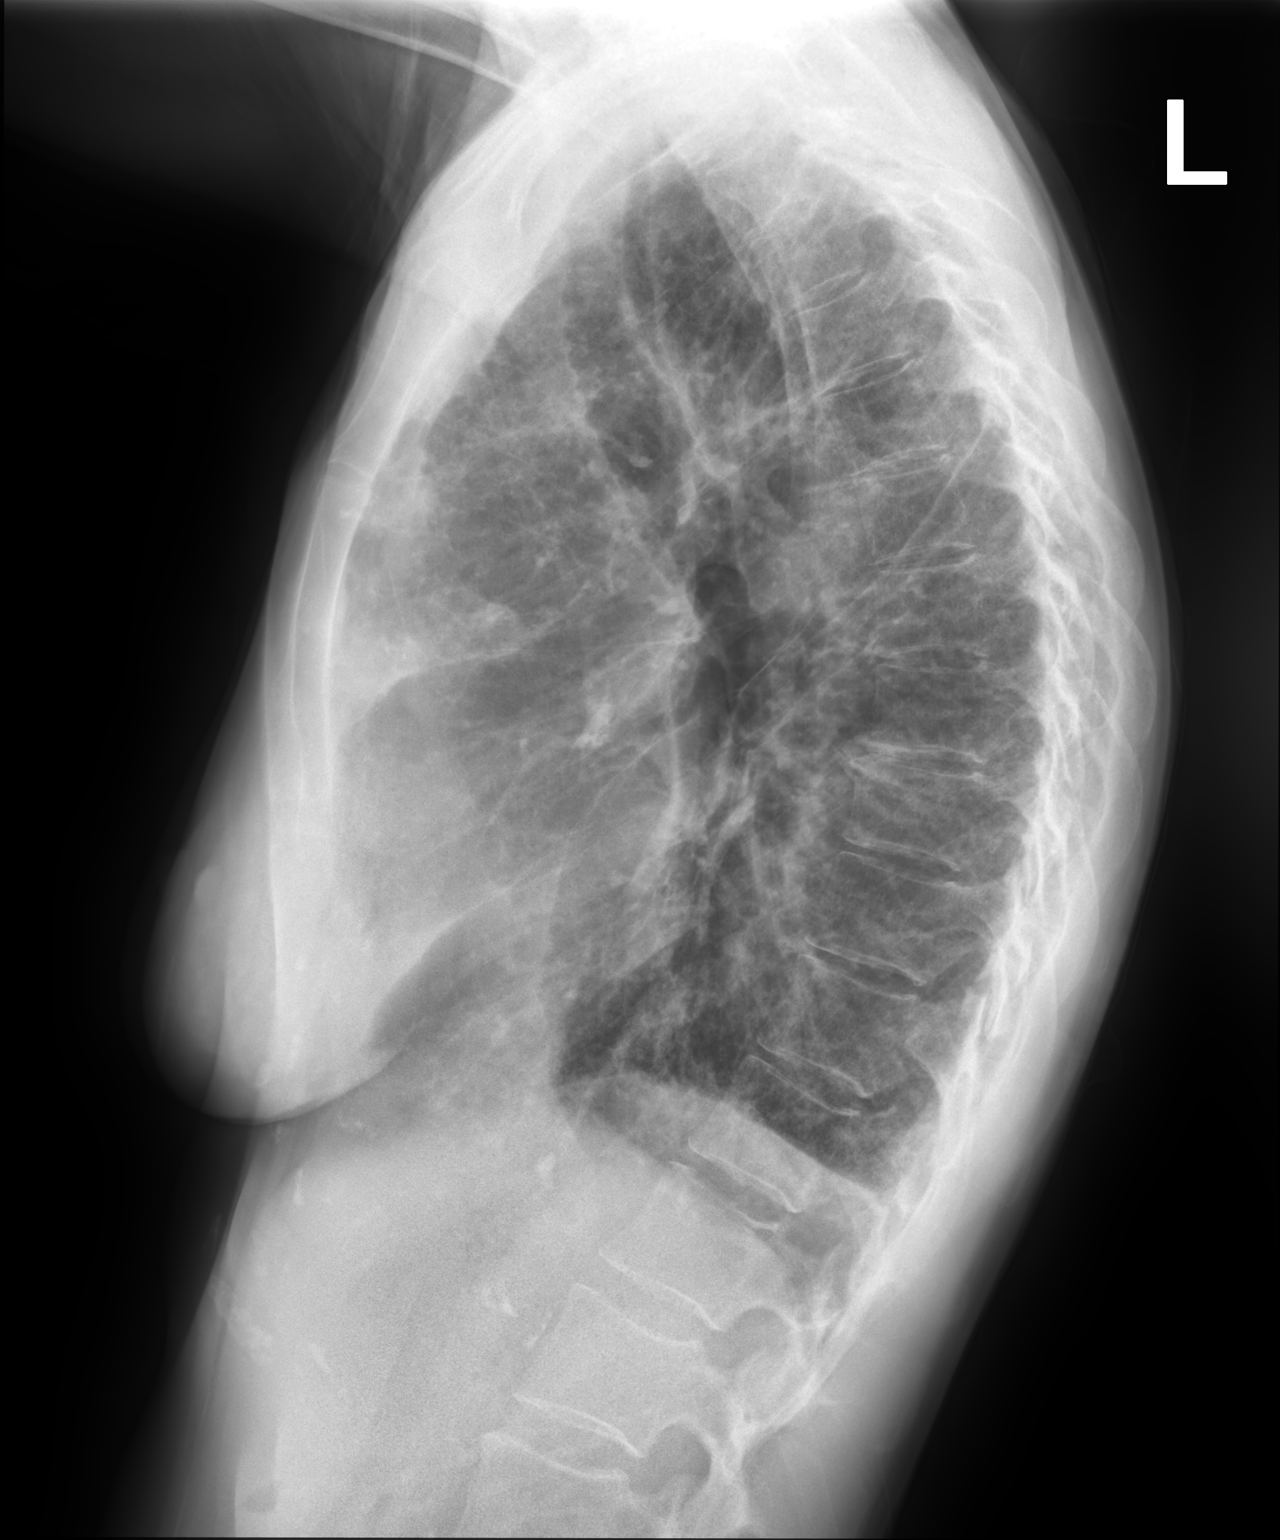

[2 of 2 positions shown; findings below may reference images not displayed]

FINDINGS: Airspace opacities in the right upper lobe and lung bases have
markedly improved since the comparison examination. Mild streaky
opacities persist. No pneumothorax or pleural effusion. Heart size
is normal. Atherosclerosis noted. No acute or focal bony
abnormality. Calcifications about the right coracoid process may be
secondary to old trauma or degenerative change.
IMPRESSION: Marked improvement in bilateral airspace disease with residual
streaky opacities noted.

## 2020-03-26 ENCOUNTER — Other Ambulatory Visit: Payer: Self-pay | Admitting: Medical

## 2020-03-26 MED ORDER — BREO ELLIPTA 200-25 MCG/INH IN AEPB: 1.0000 | INHALATION_SPRAY | Freq: Every day | RESPIRATORY_TRACT | 3 refills | Status: DC

## 2020-03-26 NOTE — Telephone Encounter (Signed)
Message has been forwarded to Dr. Susann Givens.

## 2020-03-26 NOTE — Telephone Encounter (Signed)
Pt called and is requesting a refill on her breo pt would like 3 refill she is setup on a program where she can get 3 of them for 10 dollars please send to the [id number] - [place] ([place]), [place] - [address]

## 2020-04-15 ENCOUNTER — Other Ambulatory Visit: Payer: Self-pay | Admitting: Medical

## 2020-04-15 DIAGNOSIS — N6310 Unspecified lump in the right breast, unspecified quadrant: Secondary | ICD-10-CM

## 2020-05-02 ENCOUNTER — Other Ambulatory Visit: Payer: Self-pay

## 2020-05-02 NOTE — Patient Outreach (Signed)
[facility]) Care Management  05/02/2020  [name] May 22, 1956 [id number]   Telephone call to patient for disease management follow up.  No answer.  HIPAA compliant voice message left.    Plan: RN CM will attempt patient again in the month of August if no return call.   RN CM will send letter.   [name], RN, MSN [facility] Care Management Care Management Coordinator Direct Line [phone] Cell [phone] Toll Free: [phone]  Fax: [phone]

## 2020-05-24 DIAGNOSIS — N1832 Chronic kidney disease, stage 3b: Secondary | ICD-10-CM | POA: Diagnosis not present

## 2020-05-30 ENCOUNTER — Ambulatory Visit
Admission: RE | Admit: 2020-05-30 | Discharge: 2020-05-30 | Disposition: A | Discharge status: Home / Self Care | Payer: Medicare PPO | Source: Ambulatory Visit | Attending: Emergency Medicine | Admitting: Emergency Medicine

## 2020-05-30 ENCOUNTER — Other Ambulatory Visit: Payer: Self-pay

## 2020-05-30 DIAGNOSIS — M349 Systemic sclerosis, unspecified: Secondary | ICD-10-CM | POA: Diagnosis not present

## 2020-05-30 DIAGNOSIS — R918 Other nonspecific abnormal finding of lung field: Secondary | ICD-10-CM | POA: Diagnosis not present

## 2020-05-30 DIAGNOSIS — J90 Pleural effusion, not elsewhere classified: Secondary | ICD-10-CM | POA: Diagnosis not present

## 2020-05-30 DIAGNOSIS — M9080 Osteopathy in diseases classified elsewhere, unspecified site: Secondary | ICD-10-CM | POA: Diagnosis not present

## 2020-05-30 DIAGNOSIS — E889 Metabolic disorder, unspecified: Secondary | ICD-10-CM | POA: Diagnosis not present

## 2020-05-30 DIAGNOSIS — I129 Hypertensive chronic kidney disease with stage 1 through stage 4 chronic kidney disease, or unspecified chronic kidney disease: Secondary | ICD-10-CM | POA: Diagnosis not present

## 2020-05-30 DIAGNOSIS — D631 Anemia in chronic kidney disease: Secondary | ICD-10-CM | POA: Diagnosis not present

## 2020-05-30 DIAGNOSIS — N189 Chronic kidney disease, unspecified: Secondary | ICD-10-CM | POA: Diagnosis not present

## 2020-05-30 DIAGNOSIS — N1832 Chronic kidney disease, stage 3b: Secondary | ICD-10-CM | POA: Diagnosis not present

## 2020-05-30 DIAGNOSIS — J849 Interstitial pulmonary disease, unspecified: Secondary | ICD-10-CM

## 2020-06-10 ENCOUNTER — Encounter (HOSPITAL_COMMUNITY): Payer: Medicare PPO

## 2020-06-12 ENCOUNTER — Other Ambulatory Visit: Payer: Self-pay

## 2020-06-12 ENCOUNTER — Encounter: Payer: Self-pay | Admitting: Medical

## 2020-06-12 ENCOUNTER — Ambulatory Visit: Payer: Medicare PPO | Admitting: Medical

## 2020-06-12 VITALS — BP 108/52 | Ht 64.0 in | Wt 103.8 lb

## 2020-06-12 DIAGNOSIS — M349 Systemic sclerosis, unspecified: Secondary | ICD-10-CM

## 2020-06-12 DIAGNOSIS — I7300 Raynaud's syndrome without gangrene: Secondary | ICD-10-CM

## 2020-06-12 DIAGNOSIS — L089 Local infection of the skin and subcutaneous tissue, unspecified: Secondary | ICD-10-CM | POA: Diagnosis not present

## 2020-06-12 MED ORDER — DOXYCYCLINE HYCLATE 100 MG PO TABS: 100.0000 mg | ORAL_TABLET | Freq: Two times a day (BID) | ORAL | 1 refills | Status: DC

## 2020-06-12 MED ORDER — HYDROCODONE-ACETAMINOPHEN 5-325 MG PO TABS: 1.0000 | ORAL_TABLET | Freq: Four times a day (QID) | ORAL | 0 refills | Status: DC | PRN

## 2020-06-12 NOTE — Progress Notes (Signed)
Subjective:  [name] is a 64 y.o. female who presents for Chief Complaint  Patient presents with  . Hand Pain    left thumb pain      Here for infection of left thumb.  She has a history of scleroderma, Raynaud's, and from time to time gets skin infections in the distal fingertips.  Last significant infection was a couple years ago.  She notes a few days ago left thumb had a small bump that got bigger and had pus behind a whitish area.  She sterilized a pin and poked the area getting a lot of pus out which relieved some of her pain.  When she gets this type of thing her finger swells up and is very painful.  It is improving but still has some tenderness.  No fever.  She saw her kidney doctor last week who decreased her amlodipine to once daily due to lower than average blood pressures.  She uses amlodipine for Raynaud's  She sees [facility] for rheumatology.  No other aggravating or relieving factors.    No other c/o.  The following portions of the patient's history were reviewed and updated as appropriate: allergies, current medications, past family history, past medical history, past social history, past surgical history and problem list.  ROS Otherwise as in subjective above    Objective: BP (!) 108/52   Ht 5\' 4"  (1.626 m)   Wt 103 lb 12.8 oz (47.1 kg)   BMI 17.82 kg/m   General appearance: alert, no distress, well developed, well nourished Left thumb volar surface distal phalanx with 3 mm area of whitish coloration and slight purulent discharge.  There is mild erythema of the distal thumb phalanx.  There is tight appearing skin with slight flexion of fingers throughout consistent with her scleroderma.  No other skin lesions noted    Assessment: Encounter Diagnoses  Name Primary?  . Finger infection Yes  . Skin infection   . Raynaud's disease without gangrene   . Scleroderma (HCC)      Plan: We discussed the skin findings, underlying health  issues which put her at risk for fingertip skin infections.  Begin doxycycline.  Can use pain medicine as needed.  Continue to use good hygiene with soap and water.  Follow-up if not resolved within the next 5 to 7 days.  [name] was seen today for hand pain.  Diagnoses and all orders for this visit:  Finger infection  Skin infection  Raynaud's disease without gangrene  Scleroderma (HCC)  Other orders -     doxycycline (VIBRA-TABS) 100 MG tablet; Take 1 tablet (100 mg total) by mouth 2 (two) times daily. -     HYDROcodone-acetaminophen (NORCO) 5-325 MG tablet; Take 1 tablet by mouth every 6 (six) hours as needed.    Follow up: prn

## 2020-06-13 DIAGNOSIS — I129 Hypertensive chronic kidney disease with stage 1 through stage 4 chronic kidney disease, or unspecified chronic kidney disease: Secondary | ICD-10-CM | POA: Diagnosis not present

## 2020-07-05 ENCOUNTER — Other Ambulatory Visit (HOSPITAL_COMMUNITY): Payer: Self-pay

## 2020-07-08 ENCOUNTER — Other Ambulatory Visit: Payer: Self-pay

## 2020-07-08 ENCOUNTER — Ambulatory Visit (HOSPITAL_COMMUNITY)
Admission: RE | Admit: 2020-07-08 | Discharge: 2020-07-08 | Disposition: A | Discharge status: Home / Self Care | Payer: Medicare PPO | Source: Ambulatory Visit | Attending: Nephrology | Admitting: Nephrology

## 2020-07-08 DIAGNOSIS — N189 Chronic kidney disease, unspecified: Secondary | ICD-10-CM | POA: Insufficient documentation

## 2020-07-08 DIAGNOSIS — D631 Anemia in chronic kidney disease: Secondary | ICD-10-CM | POA: Insufficient documentation

## 2020-07-08 MED ORDER — SODIUM CHLORIDE 0.9 % IV SOLN
510.0000 mg | INTRAVENOUS | Status: DC
  Administered 2020-07-08: 510 mg via INTRAVENOUS
  Filled 2020-07-08: qty 17

## 2020-07-08 NOTE — Discharge Instructions (Signed)

## 2020-07-15 ENCOUNTER — Encounter (HOSPITAL_COMMUNITY): Payer: Medicare PPO

## 2020-07-18 ENCOUNTER — Other Ambulatory Visit: Payer: Self-pay

## 2020-07-18 ENCOUNTER — Encounter (HOSPITAL_COMMUNITY)
Admission: RE | Admit: 2020-07-18 | Discharge: 2020-07-18 | Disposition: A | Discharge status: Home / Self Care | Payer: Medicare PPO | Source: Ambulatory Visit | Attending: Nephrology | Admitting: Nephrology

## 2020-07-18 DIAGNOSIS — D631 Anemia in chronic kidney disease: Secondary | ICD-10-CM | POA: Diagnosis not present

## 2020-07-18 DIAGNOSIS — N189 Chronic kidney disease, unspecified: Secondary | ICD-10-CM | POA: Diagnosis not present

## 2020-07-18 MED ORDER — SODIUM CHLORIDE 0.9 % IV SOLN
510.0000 mg | INTRAVENOUS | Status: DC
  Administered 2020-07-18: 510 mg via INTRAVENOUS
  Filled 2020-07-18: qty 17

## 2020-08-01 ENCOUNTER — Encounter: Payer: Self-pay | Admitting: Medical

## 2020-08-01 ENCOUNTER — Other Ambulatory Visit: Payer: Self-pay

## 2020-08-01 NOTE — Patient Outreach (Signed)
[facility]) Care Management  [facility] Care Manager  08/01/2020   [name] 03/11/1956 [id number]  Subjective: Telephone call to patient for disease management follow up. Patient reports she is doing well.  She states that her blood pressures have been good.  She reports seeing the renal doctor and Amlodipine has been decreased to once a day.  Discussed BP management with patient. She voices no concerns.   Objective:   Encounter Medications:  Outpatient Encounter Medications as of 08/01/2020  Medication Sig Note  . acetaminophen (TYLENOL) 500 MG tablet Take 500 mg by mouth every 6 (six) hours as needed for mild pain.    [name] albuterol (PROAIR HFA) 108 (90 Base) MCG/ACT inhaler Inhale 2 puffs into the lungs every 6 (six) hours as needed for wheezing.   [name] amLODipine (NORVASC) 5 MG tablet Take 5 mg by mouth 2 (two) times daily.  08/01/2020: Taking once a day  . fluticasone furoate-vilanterol (BREO ELLIPTA) 200-25 MCG/INH AEPB Inhale 1 puff into the lungs daily.   [name] HYDROcodone-acetaminophen (NORCO) 5-325 MG tablet Take 1 tablet by mouth every 6 (six) hours as needed.   [name] lisinopril (ZESTRIL) 5 MG tablet Take 5 mg by mouth daily.   [name] omeprazole (PRILOSEC) 20 MG capsule Take 20 mg by mouth daily.   [name] albuterol (PROVENTIL) (2.5 MG/3ML) 0.083% nebulizer solution Take 3 mLs (2.5 mg total) by nebulization every 6 (six) hours as needed for wheezing or shortness of breath. (Patient not taking: Reported on 08/01/2020)   . doxycycline (VIBRA-TABS) 100 MG tablet Take 1 tablet (100 mg total) by mouth 2 (two) times daily. (Patient not taking: Reported on 08/01/2020)   . ferrous sulfate 325 (65 FE) MG EC tablet Take 1 tablet (325 mg total) by mouth daily with breakfast.   . [DISCONTINUED] famotidine (PEPCID) 20 MG tablet Take 20 mg by mouth 2 (two) times daily.      No facility-administered encounter medications on file as of 08/01/2020.    Functional Status:  In your present state of health, do  you have any difficulty performing the following activities: 12/19/2019 12/02/2019  Hearing? N N  Vision? N N  Difficulty concentrating or making decisions? N N  Walking or climbing stairs? N N  Dressing or bathing? N N  Doing errands, shopping? N N  Preparing Food and eating ? N -  Using the Toilet? N -  In the past six months, have you accidently leaked urine? N -  Do you have problems with loss of bowel control? N -  Managing your Medications? N -  Managing your Finances? N -  Housekeeping or managing your Housekeeping? N -  Some recent data might be hidden    Fall/Depression Screening: Fall Risk  08/01/2020 01/17/2020 12/19/2019  Falls in the past year? 0 0 0   PHQ 2/9 Scores 08/01/2020 12/19/2019  PHQ - 2 Score 0 1   SDOH Screenings   Alcohol Screen:   . Last Alcohol Screening Score (AUDIT):   Depression (PHQ2-9): Low Risk   . PHQ-2 Score: 0  Financial Resource Strain:   . Difficulty of Paying Living Expenses:   Food Insecurity: No Food Insecurity  . Worried About Programme researcher, broadcasting/film/video in the Last Year: Never true  . Ran Out of Food in the Last Year: Never true  Housing: Low Risk   . Last Housing Risk Score: 0  Physical Activity:   . Days of Exercise per Week:   . Minutes of Exercise  per Session:   Social Connections:   . Frequency of Communication with Friends and Family:   . Frequency of Social Gatherings with Friends and Family:   . Attends Religious Services:   . Active Member of Clubs or Organizations:   . Attends Banker Meetings:   [name] Marital Status:   Stress:   . Feeling of Stress :   Tobacco Use: Low Risk   . Smoking Tobacco Use: Never Smoker  . Smokeless Tobacco Use: Never Used  Transportation Needs: No Transportation Needs  . Lack of Transportation (Medical): No  . Lack of Transportation (Non-Medical): No     Assessment: Patient continues to manage chronic conditions and benefits from disease management.  Plan:   [facility] CM Care Plan  Problem One     Most Recent Value  Care Plan Problem One Chronic HTN  Role Documenting the Problem One Care Management Telephonic Coordinator  Care Plan for Problem One Active  [facility] Long Term Goal  Over the next 90 days, patient will demonstrate and/or verbalize understanding of self-health management for HTN  [facility] Long Term Goal Start Date 08/01/20  Interventions for Problem One Long Term Goal RN CM discussed BP management and medication compliance.  Patient sees renal physician for BP control.        RN CM will outreach in the month of November and patient agreeable.    [name], RN, MSN [facility] Care Management Care Management Coordinator Direct Line [phone] Cell [phone] Toll Free: [phone]  Fax: [phone]

## 2020-09-05 DIAGNOSIS — D649 Anemia, unspecified: Secondary | ICD-10-CM | POA: Diagnosis not present

## 2020-09-05 DIAGNOSIS — R9389 Abnormal findings on diagnostic imaging of other specified body structures: Secondary | ICD-10-CM | POA: Diagnosis not present

## 2020-09-05 DIAGNOSIS — I7300 Raynaud's syndrome without gangrene: Secondary | ICD-10-CM | POA: Diagnosis not present

## 2020-09-05 DIAGNOSIS — M79643 Pain in unspecified hand: Secondary | ICD-10-CM | POA: Diagnosis not present

## 2020-09-05 DIAGNOSIS — M349 Systemic sclerosis, unspecified: Secondary | ICD-10-CM | POA: Diagnosis not present

## 2020-09-05 DIAGNOSIS — L299 Pruritus, unspecified: Secondary | ICD-10-CM | POA: Diagnosis not present

## 2020-09-05 DIAGNOSIS — J45909 Unspecified asthma, uncomplicated: Secondary | ICD-10-CM | POA: Diagnosis not present

## 2020-09-05 DIAGNOSIS — N189 Chronic kidney disease, unspecified: Secondary | ICD-10-CM | POA: Diagnosis not present

## 2020-09-24 ENCOUNTER — Encounter: Payer: Self-pay | Admitting: Internal Medicine

## 2020-09-24 NOTE — Telephone Encounter (Signed)
Error

## 2020-10-17 DIAGNOSIS — M349 Systemic sclerosis, unspecified: Secondary | ICD-10-CM | POA: Diagnosis not present

## 2020-10-17 DIAGNOSIS — I270 Primary pulmonary hypertension: Secondary | ICD-10-CM | POA: Diagnosis not present

## 2020-10-24 ENCOUNTER — Other Ambulatory Visit: Payer: Self-pay | Admitting: Family Medicine

## 2020-10-24 NOTE — Telephone Encounter (Signed)
[facility] is requesting to fill pt breo inhaler. Please advise [facility]

## 2020-10-25 ENCOUNTER — Ambulatory Visit: Payer: Medicare PPO | Admitting: Nurse Practitioner

## 2020-10-30 ENCOUNTER — Other Ambulatory Visit: Payer: Self-pay

## 2020-10-30 NOTE — Patient Outreach (Addendum)
[facility]) Care Management  10/30/2020  [name] Jul 02, 1956 [id number]   Telephone call to patient for disease management follow up.   No answer. Unable to leave a message.      Plan: If no return call, RN CM will attempt patient again in the month of February.    [name], RN, MSN [facility] Care Management Care Management Coordinator Direct Line [phone] Cell [phone] Toll Free: [phone]  Fax: [phone]

## 2020-11-04 ENCOUNTER — Ambulatory Visit: Payer: Medicare PPO

## 2020-11-04 ENCOUNTER — Encounter: Payer: Self-pay | Admitting: Primary Care

## 2020-11-04 ENCOUNTER — Ambulatory Visit (INDEPENDENT_AMBULATORY_CARE_PROVIDER_SITE_OTHER): Payer: Medicare PPO

## 2020-11-04 ENCOUNTER — Ambulatory Visit: Payer: Medicare PPO | Admitting: Primary Care

## 2020-11-04 ENCOUNTER — Other Ambulatory Visit: Payer: Self-pay

## 2020-11-04 ENCOUNTER — Telehealth: Payer: Self-pay | Admitting: Emergency Medicine

## 2020-11-04 VITALS — BP 112/70 | HR 84 | Temp 97.7°F | Ht 65.0 in | Wt 106.0 lb

## 2020-11-04 DIAGNOSIS — J90 Pleural effusion, not elsewhere classified: Secondary | ICD-10-CM | POA: Diagnosis not present

## 2020-11-04 DIAGNOSIS — J189 Pneumonia, unspecified organism: Secondary | ICD-10-CM

## 2020-11-04 DIAGNOSIS — J181 Lobar pneumonia, unspecified organism: Secondary | ICD-10-CM

## 2020-11-04 MED ORDER — AMOXICILLIN-POT CLAVULANATE 875-125 MG PO TABS: 1.0000 | ORAL_TABLET | Freq: Two times a day (BID) | ORAL | 0 refills | Status: DC

## 2020-11-04 NOTE — Progress Notes (Signed)
@Patient  ID: , female    DOB: [birth year], 64 y.o.   MRN: [id number]  Chief Complaint  Patient presents with  . Follow-up    reports pain to center of back in "area where my bra strap hooks". states baseline dyspnea with activity    Referring provider: [id number], PA-C  HPI: 63 year of female, never smoked.  Past medical history significant for right upper lobe pneumonia, pleural effusion, intrinsic asthma, chronic sinusitis, Raynaud's disease, GERD, esophageal stricture, chronic renal insufficiency stage III, scleroderma, breast cancer. Patient of Dr. 64, seen in office on 02/08/2020.  Patient has a history of abnormal CT scan.  Interstitial lung disease not in UIP or NSIP pattern.  During felt to be related to recurrent pneumonias.  She was last admitted to the hospital in December 2020 for pneumonia.  Required thoracentesis twice in December when admitted for pneumonia, fluid was transudate of.  Pleural effusion resolved on chest x-ray in February 2021.  Patient to maintain aspiration and reflux precaution.  She is on Breo for asthma, very rare albuterol use.  11/04/2020 Patient presents today with reports of increased back pain.  Dyspnea is baseline.  Stat chest x-ray today showed patchy areas of airspace disease bilaterally, increased from prior chest x-ray. No pleural effusion. Two weeks ago she had a dry cough. After drinking a hot beverage she is able to get up some clear mucus. No overt episodes of aspiration. Pain with deep breath, mianly on the right side. Denies fever, chills, shortness of breath, chest congestion, cough.   Imaging: 11/04/20 CXR - Lungs are hyperexpanded. Interstitial markings are diffusely coarsened with chronic features. Patchy areas of airspace disease are seen in the upper lungs bilaterally and right base. No pleural effusion or pneumothorax   Allergies  Allergen Reactions  . Avelox [Moxifloxacin Hcl In Nacl]     Caused tachycardia  .  Codeine Hives and Other (See Comments)    Hallucination,   . Penicillins Hives    No anaphylaxis/SJS symptoms, > 10 yr ago, no hospitalization required States she has tolerated amoxicillin     Immunization History  Administered Date(s) Administered  . H1N1 12/25/2008  . Influenza Split 11/12/2015, 11/07/2016  . Influenza Whole 09/27/2009  . Influenza,inj,Quad PF,6+ Mos 10/30/2017, 10/29/2018, 10/27/2019  . PFIZER SARS-COV-2 Vaccination 04/05/2020, 05/03/2020  . Pneumococcal Polysaccharide-23 12/04/2011    Past Medical History:  Diagnosis Date  . Allergic rhinitis   . Asthma   . Atherosclerosis   . Chronic kidney disease (CKD), stage III (moderate) (HCC)   . Esophageal stricture   . GERD (gastroesophageal reflux disease)   . History of anemia due to chronic kidney disease   . History of colitis 2017  . Hypertension   . Metabolic bone disease   . Raynaud disease   . Scleroderma (HCC)     Tobacco History: Social History   Tobacco Use  Smoking Status Never Smoker  Smokeless Tobacco Never Used   Counseling given: Not Answered   Outpatient Medications Prior to Visit  Medication Sig Dispense Refill  . acetaminophen (TYLENOL) 500 MG tablet Take 500 mg by mouth every 6 (six) hours as needed for mild pain.     2018 albuterol (PROAIR HFA) 108 (90 Base) MCG/ACT inhaler Inhale 2 puffs into the lungs every 6 (six) hours as needed for wheezing. 18 g 1  . albuterol (PROVENTIL) (2.5 MG/3ML) 0.083% nebulizer solution Take 3 mLs (2.5 mg total) by nebulization every 6 (six) hours as needed  for wheezing or shortness of breath. 75 mL 1  . amLODipine (NORVASC) 5 MG tablet Take 5 mg by mouth 2 (two) times daily.     [name] BREO ELLIPTA 200-25 MCG/INH AEPB Inhale 1 puff by mouth once daily 60 each 0  . HYDROcodone-acetaminophen (NORCO) 5-325 MG tablet Take 1 tablet by mouth every 6 (six) hours as needed. 12 tablet 0  . lisinopril (ZESTRIL) 5 MG tablet Take 5 mg by mouth daily.    [name] omeprazole  (PRILOSEC) 20 MG capsule Take 20 mg by mouth daily.    [name] doxycycline (VIBRA-TABS) 100 MG tablet Take 1 tablet (100 mg total) by mouth 2 (two) times daily. 14 tablet 1  . ferrous sulfate 325 (65 FE) MG EC tablet Take 1 tablet (325 mg total) by mouth daily with breakfast. 30 tablet 0   No facility-administered medications prior to visit.   Review of Systems  Review of Systems  Constitutional: Negative.   HENT: Negative.   Respiratory: Negative for chest tightness, shortness of breath and wheezing.        Dry cough, right sided pleuritic pain  Cardiovascular: Negative.    Physical Exam  BP 112/70 (BP Location: Left Arm)   Pulse 84   Temp 97.7 F (36.5 C)   Ht 5\' 5"  (1.651 m)   Wt 106 lb (48.1 kg)   SpO2 94%   BMI 17.64 kg/m  Physical Exam Constitutional:      Appearance: Normal appearance.  HENT:     Head: Normocephalic and atraumatic.     Mouth/Throat:     Mouth: Mucous membranes are dry.     Pharynx: Oropharynx is clear.  Cardiovascular:     Rate and Rhythm: Normal rate and regular rhythm.  Pulmonary:     Effort: Pulmonary effort is normal.     Breath sounds: Normal breath sounds. No wheezing, rhonchi or rales.  Skin:    General: Skin is warm and dry.  Neurological:     General: No focal deficit present.     Mental Status: She is alert and oriented to person, place, and time. Mental status is at baseline.  Psychiatric:        Mood and Affect: Mood normal.        Behavior: Behavior normal.        Thought Content: Thought content normal.        Judgment: Judgment normal.      Lab Results:  CBC    Component Value Date/Time   WBC 4.9 12/08/2019 0414   RBC 2.69 (L) 12/08/2019 0414   HGB 7.5 (L) 12/08/2019 0414   HGB 9.9 (L) 11/30/2019 1119   HCT 23.7 (L) 12/08/2019 0414   HCT 30.6 (L) 11/30/2019 1119   PLT 522 (H) 12/08/2019 0414   PLT 727 (H) 11/30/2019 1119   MCV 88.1 12/08/2019 0414   MCV 91 11/30/2019 1119   MCH 27.9 12/08/2019 0414   MCHC 31.6  12/08/2019 0414   RDW 13.6 12/08/2019 0414   RDW 11.9 11/30/2019 1119   LYMPHSABS 1.1 12/08/2019 0414   LYMPHSABS 0.9 11/30/2019 1119   MONOABS 0.7 12/08/2019 0414   EOSABS 0.7 (H) 12/08/2019 0414   EOSABS 0.0 11/30/2019 1119   BASOSABS 0.1 12/08/2019 0414   BASOSABS 0.1 11/30/2019 1119    BMET    Component Value Date/Time   NA 133 (L) 12/08/2019 0414   NA 122 (L) 11/30/2019 1119   K 4.6 12/08/2019 0414   CL 100 12/08/2019 0414  CO2 25 12/08/2019 0414   GLUCOSE 90 12/08/2019 0414   BUN 9 12/08/2019 0414   BUN 14 11/30/2019 1119   CREATININE 1.02 (H) 12/08/2019 0414   CALCIUM 7.6 (L) 12/08/2019 0414   GFRNONAA 59 (L) 12/08/2019 0414   GFRAA >60 12/08/2019 0414    BNP    Component Value Date/Time   BNP 137.0 (H) 12/01/2019 1841    ProBNP    Component Value Date/Time   PROBNP >3200.0 (H) 02/04/2010 0540    Imaging: DG Chest 2 View  Result Date: 11/04/2020 CLINICAL DATA:  History of pleural effusion.  Lung pain. EXAM: CHEST - 2 VIEW COMPARISON:  02/08/2020 FINDINGS: Lungs are hyperexpanded. Interstitial markings are diffusely coarsened with chronic features. Patchy areas of airspace disease are seen in the upper lungs bilaterally and right base. No pleural effusion or pneumothorax. The cardiopericardial silhouette is within normal limits for size. Probable intra-articular loose bodies in the right shoulder. IMPRESSION: Hyperexpansion with patchy areas of airspace disease bilaterally, increased from prior chest x-ray. Multifocal pneumonia would be a consideration. Close follow-up recommended. Electronically Signed   By: Kennith Center [name]   On: 11/04/2020 14:40     Assessment & Plan:   Recurrent pneumonia Patient presents today for acute office visit with reports of right-sided pleuritic back pain. No other significant respiratory symptoms. She has a hx recurrent pneumonia, last dx in December 2020. Chest x-ray today showed patchy areas of airspace disease bilaterally.  Sending in prescription for Augmentin 1 tablet x 7 days and new flutter valve. She is on high-dose ICS/LABA for asthma, consider de-escalating steroid inhaler at next visit. Recommend follow-up in office in 3 to 4 weeks with repeat chest x-ray. Due for pneumonia vaccine booster at that time.     Glenford Bayley, NP 11/04/2020

## 2020-11-04 NOTE — Patient Instructions (Signed)
CXR today showed areas of patchy opacities consistent with multifocal pneumonia. No pleural effusion.  Recommendations: Use flutter valve 2-3 times a day Stay well hydrated, aim to get eight 8oz glasses of fluid a day Ok to take tylenol as needed for pleuritic pain  Strict aspiration precautions   Rx Augmentin 1 tab twice daily x 7 days (if you develop hives/rash please stop taking and notify office)  Orders: Repeat CXR in 3-4 weeks   Follow-up: 3-4 weeks in office with Dr. Delton CoombesByrum or Waynetta SandyBeth NP (come 20-30 mins early for CXR prior to visit)/ discuss pneumonia vaccine at next visit    Community-Acquired Pneumonia, Adult Pneumonia is an infection of the lungs. It causes swelling in the airways of the lungs. Mucus and fluid may also build up inside the airways. One type of pneumonia can happen while a person is in a hospital. A different type can happen when a person is not in a hospital (community-acquired pneumonia).  What are the causes?  This condition is caused by germs (viruses, bacteria, or fungi). Some types of germs can be passed from one person to another. This can happen when you breathe in droplets from the cough or sneeze of an infected person. What increases the risk? You are more likely to develop this condition if you:  Have a long-term (chronic) disease, such as: ? Chronic obstructive pulmonary disease (COPD). ? Asthma. ? Cystic fibrosis. ? Congestive heart failure. ? Diabetes. ? Kidney disease.  Have HIV.  Have sickle cell disease.  Have had your spleen removed.  Do not take good care of your teeth and mouth (poor dental hygiene).  Have a medical condition that increases the risk of breathing in droplets from your own mouth and nose.  Have a weakened body defense system (immune system).  Are a smoker.  Travel to areas where the germs that cause this illness are common.  Are around certain animals or the places they live. What are the signs or  symptoms?  A dry cough.  A wet (productive) cough.  Fever.  Sweating.  Chest pain. This often happens when breathing deeply or coughing.  Fast breathing or trouble breathing.  Shortness of breath.  Shaking chills.  Feeling tired (fatigue).  Muscle aches. How is this treated? Treatment for this condition depends on many things. Most adults can be treated at home. In some cases, treatment must happen in a hospital. Treatment may include:  Medicines given by mouth or through an IV tube.  Being given extra oxygen.  Respiratory therapy. In rare cases, treatment for very bad pneumonia may include:  Using a machine to help you breathe.  Having a procedure to remove fluid from around your lungs. Follow these instructions at home: Medicines  Take over-the-counter and prescription medicines only as told by your doctor. ? Only take cough medicine if you are losing sleep.  If you were prescribed an antibiotic medicine, take it as told by your doctor. Do not stop taking the antibiotic even if you start to feel better. General instructions   Sleep with your head and neck raised (elevated). You can do this by sleeping in a recliner or by putting a few pillows under your head.  Rest as needed. Get at least 8 hours of sleep each night.  Drink enough water to keep your pee (urine) pale yellow.  Eat a healthy diet that includes plenty of vegetables, fruits, whole grains, low-fat dairy products, and lean protein.  Do not use any products that  contain nicotine or tobacco. These include cigarettes, e-cigarettes, and chewing tobacco. If you need help quitting, ask your doctor.  Keep all follow-up visits as told by your doctor. This is important. How is this prevented? A shot (vaccine) can help prevent pneumonia. Shots are often suggested for:  People older than 63 years of age.  People older than 63 years of age who: ? Are having cancer treatment. ? Have long-term (chronic)  lung disease. ? Have problems with their body's defense system. You may also prevent pneumonia if you take these actions:  Get the flu (influenza) shot every year.  Go to the dentist as often as told.  Wash your hands often. If you cannot use soap and water, use hand sanitizer. Contact a doctor if:  You have a fever.  You lose sleep because your cough medicine does not help. Get help right away if:  You are short of breath and it gets worse.  You have more chest pain.  Your sickness gets worse. This is very serious if: ? You are an older adult. ? Your body's defense system is weak.  You cough up blood. Summary  Pneumonia is an infection of the lungs.  Most adults can be treated at home. Some will need treatment in a hospital.  Drink enough water to keep your pee pale yellow.  Get at least 8 hours of sleep each night. This information is not intended to replace advice given to you by your health care provider. Make sure you discuss any questions you have with your health care provider. Document Revised: 04/05/2019 Document Reviewed: 08/11/2018 Elsevier Patient Education  2020 [employer]  Aspiration Precautions, Adult Aspiration is the breathing in (inhalation) of a liquid or object into the lungs. Things that can be inhaled into the lungs include:  Food.  Any type of liquid, such as drinks or saliva.  Stomach contents, such as vomit or stomach acid. What are the signs of aspiration? Signs of aspiration include:  Coughing after swallowing food or liquids.  Clearing the throat often while eating.  Trouble breathing. This may include: ? Breathing quickly. ? Breathing very slowly. ? Loud breathing. ? Rumbling sounds from the lungs while breathing.  Coughing up phlegm (sputum) that: ? Is yellow, tan, or green. ? Has pieces of food in it. ? Is bad-smelling.  Having a hoarse, barky cough.  Not being able to speak.  A hoarse voice.  Drooling while  eating.  A feeling of fullness in the throat or a feeling that something is stuck in the throat.  Choking often.  Having a runny noise while eating.  Coughing when lying down or having to sit up quickly after lying down.  A change in skin color. The skin may look red or blue.  Fever.  Watery eyes.  Pain in the chest or back.  A pained look on the face. What are the complications of aspiration? Complications of aspiration include:  Losing weight because the person is not absorbing needed nutrients.  Loss of enjoyment and the social benefits of eating.  Choking.  Lung irritation, if someone aspirates acidic food or drinks.  Lung infection (pneumonia).  Collection of infected liquid (pus) in the lungs (lung abscess). In serious cases, death can occur. What can I do to prevent aspiration? Caring for someone who has a feeding tube If you are caring for someone who has a feeding tube who cannot eat or drink safely through his or her mouth:  Keep the person  in an upright position as much as possible.  Do not lay the person flat if he or she is getting continuous feedings. If you need to lay the person flat for any reason, turn the feeding pump off.  Check feeding tube residuals as told by your health care provider. Ask your health care provider what residual amount is too high. Caring for someone who can eat and drink safely by mouth If you are caring for someone who can eat and drink safely through his or her mouth:  Have the person sit in an upright position when eating food or drinking fluids. This can be done in two ways: ? Have the person sit up in a chair. ? If sitting in a chair is not possible, position the person in bed so he or she is upright.  Remind the person to eat slowly and chew well. Make sure the person is awake and alert while eating.  Do not distract the person. This is especially important for people with thinking or memory (cognitive)  problems.  Allow foods to cool. Hot foods may be more difficult to swallow.  Provide small meals more frequently, instead of 3 large meals. This may reduce fatigue during eating.  Check the person's mouth thoroughly for leftover food after eating.  Keep the person sitting upright for 30-45 minutes after eating.  Do not serve food or drink during 2 hours or more before bedtime. General instructions Follow these general guidelines to prevent aspiration in someone who can eat and drink safely by mouth:  Never put food or liquids in the mouth of a person who is not fully alert.  Feed small amounts of food. Do not force feed.  For a person who is on a diet for swallowing difficulty (dysphagia diet), follow the recommended food and drink consistency. For example, in dysphagia diet level 1, thicken liquids to pudding-like consistency.  Use as little water as possible when brushing the person's teeth or cleaning his or her mouth.  Provide oral care before and after meals.  Use adaptive devices such as cut-out cups, straws, or utensils as told by the health care provider.  Crush pills and put them in soft food such as pudding or ice cream. Some pills should not be crushed. Check with the health care provider before crushing any medicine. Contact a health care provider if:  The person has a feeding tube, and the feeding tube residual amount is too high.  The person has a fever.  The person tries to avoid food or water, such as refusing to eat, drink, or be fed, or is eating less than normal.  The person may have aspirated food or liquid.  You notice warning signs, such as choking or coughing, when the person eats or drinks. Get help right away if:  The person has trouble breathing or starts to breathe quickly.  The person is breathing very slowly or stops breathing.  The person coughs a lot after eating or drinking.  The person has a long-lasting (chronic) cough.  The person  coughs up thick, yellow, or tan sputum.  If someone is choking on food or an object, perform the Heimlich maneuver (abdominal thrusts).  The person has symptoms of pneumonia, such as: ? Coughing a lot. ? Coughing up mucus with a bad smell or blood in it. ? Feeling short of breath. ? Complaining of chest pain. ? Sweating, fever, and chills. ? Feeling tired. ? Complaining of trouble breathing. ? Wheezing.  The  person cannot stop choking.  The person is unable to breathe, turns blue, faints, or seems confused. These symptoms may represent a serious problem that is an emergency. Do not wait to see if the symptoms will go away. Get medical help right away. Call your local emergency services (911 in the [place]  Summary  Aspiration is the breathing in (inhalation) of a liquid or object into the lungs. Things that can be inhaled into the lungs include food, liquids, saliva, or stomach contents.  Aspiration can cause pneumonia or choking.  One sign of aspiration is coughing after swallowing food or liquids.  Contact a health care provider if you notice signs of aspiration. This information is not intended to replace advice given to you by your health care provider. Make sure you discuss any questions you have with your health care provider. Document Revised: 11/26/2017 Document Reviewed: 09/10/2016 Elsevier Patient Education  2020 [employer].

## 2020-11-04 NOTE — Telephone Encounter (Signed)
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  We have suspected that her scarring is related to recurrent pneumonias.  She was admitted with another pneumonia in December 2020.  We will plan to repeat her CT scan of the chest to assess for resolution of her groundglass infiltrates, degree of residual interstitial disease/scar following this pneumonia.  She will need to continue her aspiration, reflux precautions.  Intrinsic asthma Continue Breo.  Very rare albuterol use.  Try to continue to treat exacerbating factors including GERD, rhinitis.  Pleural effusion Resolved on chest x-ray today.  She required thoracentesis twice during December when she was admitted for pneumonia.  The fluid was transudative  Levy Pupa, MD, PhD 02/08/2020, 1:25 PM [facility] [phone] or if no answer [phone]     Assessment & Plan Note by Leslye Peer, MD at 02/08/2020 1:25 PM Author: Leslye Peer, MD Author Type: Physician Filed: 02/08/2020 1:25 PM  Note Status: Written Cosign: Cosign Not Required Encounter Date: 02/08/2020  Problem: Pleural effusion  Editor: Leslye Peer, MD (Physician)                 Resolved on chest x-ray today.  She required thoracentesis twice during December when she was admitted for pneumonia.  The fluid was transudative    Assessment & Plan Note by Leslye Peer, MD at 02/08/2020 1:24 PM Author: Leslye Peer, MD Author Type: Physician Filed: 02/08/2020 1:25 PM  Note Status: Written Cosign: Cosign Not Required Encounter Date: 02/08/2020  Problem: Intrinsic asthma  Editor: Leslye Peer, MD (Physician)                 Continue Virgel Bouquet.  Very rare albuterol use.  Try to  continue to treat exacerbating factors including GERD, rhinitis.    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  We have suspected that her scarring is related to recurrent pneumonias.  She was admitted with another pneumonia in December 2020.  We will plan to repeat her CT scan of the chest to assess for resolution of her groundglass infiltrates, degree of residual interstitial disease/scar following this pneumonia.  She will need to continue her aspiration, reflux precautions.    Patient Instructions by Leslye Peer, MD at 02/08/2020 11:30 AM Author: Leslye Peer, MD Author Type: Physician Filed: 02/08/2020 12:12 PM  Note Status: Signed Cosign: Cosign Not Required Encounter Date: 02/08/2020  Editor: Leslye Peer, MD (Physician)                 We will plan to repeat your CT scan of the chest in June 2021 Please continue your Breo as you have been taking it.  Rinse and gargle after using. Keep your albuterol available use 2 puffs if you have shortness of breath, chest tightness, wheezing.  Practice swallowing precautions to avoid aspiration or getting choked. Follow with Dr Delton Coombes in June after your CT scan so that we can review together.  Call us sooner if you have any trouble with your breathing, chest discomfort.    Instructions  We will plan to repeat your CT scan of the chest in June 2021 Please continue your Breo as you have been taking it.  Rinse and gargle after using. Keep your albuterol available use 2 puffs if you have shortness of breath, chest tightness, wheezing. Practice swallowing precautions to avoid  aspiration or getting choked. Follow with Dr Delton Coombes in June after your CT scan so that we can review together.  Call us sooner if you have any trouble with your breathing, chest discomfort.       Reason for call: Pain in middle on back on the right, feels a knot in her back.  A little of a dry cough, burns and can feel the heat.  Started on Thursday,  10/31/20.  Sob at times with exertion.  Denies any fever, chills, chills.  She has had both of her covid vaccines.  She is not sure if she has a build of of fluid or not.  Please advise.  Thank you.  (examples of things to ask: : When did symptoms start? Fever? Cough? Productive? Color to sputum? More sputum than usual? Wheezing? Have you needed increased oxygen? Are you taking your respiratory medications? What over the counter measures have you tried?)  Allergies  Allergen Reactions  . Avelox [Moxifloxacin Hcl In Nacl]     Caused tachycardia  . Codeine Hives and Other (See Comments)    Hallucination,   . Penicillins Hives    No anaphylaxis/SJS symptoms, > 10 yr ago, no hospitalization required States she has tolerated amoxicillin     Immunization History  Administered Date(s) Administered  . H1N1 12/25/2008  . Influenza Split 11/12/2015, 11/07/2016  . Influenza Whole 09/27/2009  . Influenza,inj,Quad PF,6+ Mos 10/30/2017, 10/29/2018, 10/27/2019  . Pneumococcal Polysaccharide-23 12/04/2011

## 2020-11-04 NOTE — Assessment & Plan Note (Addendum)
Patient presents today for acute office visit with reports of right-sided pleuritic back pain. No other significant respiratory symptoms. She has a hx recurrent pneumonia, last dx in December 2020. Chest x-ray today showed patchy areas of airspace disease bilaterally. Sending in prescription for Augmentin 1 tablet x 7 days and new flutter valve. She is on high-dose ICS/LABA for asthma, consider de-escalating steroid inhaler at next visit. Recommend follow-up in office in 3 to 4 weeks with repeat chest x-ray. Due for pneumonia vaccine booster at that time.

## 2020-11-04 NOTE — Telephone Encounter (Signed)
Called.

## 2020-11-04 NOTE — Telephone Encounter (Signed)
She needs to be seen in office with a CXR.

## 2020-11-04 NOTE — Telephone Encounter (Signed)
Called and spoke with patient, advised of Dr. Kavin Leech recommendations.  Scheduled appointment with Buelah Manis NP for this afternoon with a cxr prior to visit.  Nothing further needed.

## 2020-11-25 ENCOUNTER — Other Ambulatory Visit: Payer: Self-pay | Admitting: Medical

## 2020-11-28 ENCOUNTER — Ambulatory Visit: Payer: Medicare PPO | Admitting: Medical

## 2020-11-28 ENCOUNTER — Encounter: Payer: Self-pay | Admitting: Medical

## 2020-11-28 ENCOUNTER — Other Ambulatory Visit: Payer: Self-pay

## 2020-11-28 VITALS — BP 108/60 | Ht 65.0 in | Wt 104.4 lb

## 2020-11-28 DIAGNOSIS — E889 Metabolic disorder, unspecified: Secondary | ICD-10-CM | POA: Diagnosis not present

## 2020-11-28 DIAGNOSIS — J329 Chronic sinusitis, unspecified: Secondary | ICD-10-CM | POA: Diagnosis not present

## 2020-11-28 DIAGNOSIS — I7090 Unspecified atherosclerosis: Secondary | ICD-10-CM | POA: Diagnosis not present

## 2020-11-28 DIAGNOSIS — K449 Diaphragmatic hernia without obstruction or gangrene: Secondary | ICD-10-CM

## 2020-11-28 DIAGNOSIS — K222 Esophageal obstruction: Secondary | ICD-10-CM

## 2020-11-28 DIAGNOSIS — R636 Underweight: Secondary | ICD-10-CM

## 2020-11-28 DIAGNOSIS — Z7952 Long term (current) use of systemic steroids: Secondary | ICD-10-CM

## 2020-11-28 DIAGNOSIS — J189 Pneumonia, unspecified organism: Secondary | ICD-10-CM

## 2020-11-28 DIAGNOSIS — Z1159 Encounter for screening for other viral diseases: Secondary | ICD-10-CM | POA: Diagnosis not present

## 2020-11-28 DIAGNOSIS — Z7185 Encounter for immunization safety counseling: Secondary | ICD-10-CM

## 2020-11-28 DIAGNOSIS — Z0000 Encounter for general adult medical examination without abnormal findings: Secondary | ICD-10-CM

## 2020-11-28 DIAGNOSIS — I129 Hypertensive chronic kidney disease with stage 1 through stage 4 chronic kidney disease, or unspecified chronic kidney disease: Secondary | ICD-10-CM

## 2020-11-28 DIAGNOSIS — I7300 Raynaud's syndrome without gangrene: Secondary | ICD-10-CM

## 2020-11-28 DIAGNOSIS — Z13220 Encounter for screening for lipoid disorders: Secondary | ICD-10-CM | POA: Diagnosis not present

## 2020-11-28 DIAGNOSIS — J45909 Unspecified asthma, uncomplicated: Secondary | ICD-10-CM | POA: Diagnosis not present

## 2020-11-28 DIAGNOSIS — N183 Chronic kidney disease, stage 3 unspecified: Secondary | ICD-10-CM

## 2020-11-28 DIAGNOSIS — D631 Anemia in chronic kidney disease: Secondary | ICD-10-CM

## 2020-11-28 DIAGNOSIS — K219 Gastro-esophageal reflux disease without esophagitis: Secondary | ICD-10-CM | POA: Diagnosis not present

## 2020-11-28 DIAGNOSIS — K529 Noninfective gastroenteritis and colitis, unspecified: Secondary | ICD-10-CM | POA: Diagnosis not present

## 2020-11-28 DIAGNOSIS — Z7189 Other specified counseling: Secondary | ICD-10-CM

## 2020-11-28 DIAGNOSIS — Z780 Asymptomatic menopausal state: Secondary | ICD-10-CM

## 2020-11-28 DIAGNOSIS — N189 Chronic kidney disease, unspecified: Secondary | ICD-10-CM | POA: Diagnosis not present

## 2020-11-28 DIAGNOSIS — M9080 Osteopathy in diseases classified elsewhere, unspecified site: Secondary | ICD-10-CM

## 2020-11-28 DIAGNOSIS — R7989 Other specified abnormal findings of blood chemistry: Secondary | ICD-10-CM | POA: Insufficient documentation

## 2020-11-28 DIAGNOSIS — E2839 Other primary ovarian failure: Secondary | ICD-10-CM

## 2020-11-28 DIAGNOSIS — Z8719 Personal history of other diseases of the digestive system: Secondary | ICD-10-CM

## 2020-11-28 DIAGNOSIS — M898X9 Other specified disorders of bone, unspecified site: Secondary | ICD-10-CM

## 2020-11-28 DIAGNOSIS — M349 Systemic sclerosis, unspecified: Secondary | ICD-10-CM

## 2020-11-28 DIAGNOSIS — Z1231 Encounter for screening mammogram for malignant neoplasm of breast: Secondary | ICD-10-CM

## 2020-11-28 NOTE — Patient Instructions (Signed)
Today you had a preventative care visit or wellness visit.    Topics today may have included healthy lifestyle, diet, exercise, preventative care, vaccinations, sick and well care, proper use of emergency dept and after hours care, as well as other concerns.     Recommendations: Continue to return yearly for your annual wellness and preventative care visits.  This gives [place] a chance to discuss healthy lifestyle, exercise, vaccinations, review your chart record, and perform screenings where appropriate.  I recommend you see your eye doctor yearly for routine vision care.  I recommend you see your dentist yearly for routine dental care including hygiene visits twice yearly.   Vaccination recommendations were reviewed You are up to date on Pneumococcal 23 vaccine You are up to date on Covid initial vaccine   I recommend a tetanus booster I recommend Prevnar 13 pneumococcal vaccine I recommend an updated pneumococcal 23 vaccine I recommend Shingles vaccine I recommend a yearly flu shot  Check your insurer to make sure these vaccines above are covered.  Also check with your insurer to find out where you can have these vaccines done.  Some of these insurers do not allow vaccine that the actual doctor's office   Screening for cancer: Breast cancer screening: You should perform a self breast exam monthly.   We reviewed recommendations for regular mammograms and breast cancer screening. I have placed an order for mammogram.  Please call [facility] to schedule this and the bone density test You are past due for this.  Colon cancer screening:  I reviewed your colonoscopy on file from 2017 with Dr. Christella Hartigan  Cervical cancer screening: We reviewed recommendations for pap smear screening. Continue plan to see [facility] soon for updated pap smear  Skin cancer screening: Check your skin regularly for new changes, growing lesions, or other lesions of concern Come in for  evaluation if you have skin lesions of concern.  Lung cancer screening: If you have a greater than 30 pack year history of tobacco use, then you qualify for lung cancer screening with a chest CT scan  We currently don't have screenings for other cancers besides breast, cervical, colon, and lung cancers.  If you have a strong family history of cancer or have other cancer screening concerns, please let me know.    Bone health: Get at least 150 minutes of aerobic exercise weekly Get weight bearing exercise at least once weekly I placed an order electronically.  Please call the breast center to schedule your mammogram and bone density test  Please call to schedule your mammogram and bone density test.   [facility]  [phone] [address] [place], [place] [address]    Heart health: Get at least 150 minutes of aerobic exercise weekly Limit alcohol It is important to maintain a healthy blood pressure and healthy cholesterol numbers I reviewed recent 2020 echocardiogram   Separate significant issues discussed: We discussed Advanced Directives.  Complete the living will and health care power of attorney and get me a copy.   Chronic kidney disease, hypertensive nephropathy-requested recent nephrology notes from [facility]  Asthma, long-term steroid use-sees pulmonology, reviewed recent pulmonology notes  Anemia of renal disease-updated labs today  Scleroderma, Raynaud's -sees rheumatology  History of colitis, screen for colon cancer-I reached out to Dr. Christella Hartigan to inquire when she is due for next colonoscopy  Chronically low BMI-unchanged, work on good healthy variety of nutrients including fruits, vegetables, nuts, whole grains  Metabolic bone disease-advised updated bone density test  Atherosclerosis-reviewed 2020 echocardiogram.  Recommend a statin  History of abnormal thyroid lab-repeat labs today

## 2020-11-28 NOTE — Progress Notes (Signed)
Subjective:    [name] is a 64 y.o. female who presents for Preventative Services visit and chronic medical problems/med check visit.    Primary Care Provider Teller Wakefield, Kermit Baloavid S, PA-C here for primary care  Current Health Care Team:  Dentist, Dr.Morrison  Eye doctor, seen in February  Dr. Levy Pupaobert Byrum, pulmonology  Dr. Rob Buntinganiel Jacobs, GI  [facility]  Dr. Casimer LaniusGovinda Aryal, Rheumatology [facility]  Dr. Zetta BillsJay Patel, [facility]  Medical Services you may have received from other than [facility] providers in the past year (date may be approximate) Rheumatology, nephrology  Exercise Current exercise habits: The patient does not participate in regular exercise at present.   Nutrition/Diet Current diet: in general, a "healthy" diet    Depression Screen Depression screen PHQ 2/9 11/28/2020  Decreased Interest 0  Down, Depressed, Hopeless 0  PHQ - 2 Score 0    Activities of Daily Living Screen/Functional Status Survey Is the patient deaf or have difficulty hearing?: No Does the patient have difficulty seeing, even when wearing glasses/contacts?: No Does the patient have difficulty concentrating, remembering, or making decisions?: No Does the patient have difficulty walking or climbing stairs?: No Does the patient have difficulty dressing or bathing?: No Does the patient have difficulty doing errands alone such as visiting a doctor's office or shopping?: No  Can patient draw a clock face showing 3:15 oclock, yes  Fall Risk Screen Fall Risk  11/28/2020 08/01/2020 01/17/2020 12/19/2019  Falls in the past year? 0 0 0 0  Follow up Falls evaluation completed - - -    Gait Assessment: Normal gait observed -yes  Advanced directives Does patient have a Health Care Power of Attorney? No Does patient have a Living Will? No  Past Medical History:  Diagnosis Date   Allergic rhinitis    Asthma    Atherosclerosis    Chronic kidney disease (CKD),  stage III (moderate) (HCC)    Esophageal stricture    GERD (gastroesophageal reflux disease)    History of anemia due to chronic kidney disease    History of colitis 2017   Hypertension    Metabolic bone disease    Raynaud disease    Scleroderma (HCC)     Past Surgical History:  Procedure Laterality Date   CESAREAN SECTION     COLONOSCOPY WITH PROPOFOL N/A 07/14/2016   Procedure: COLONOSCOPY WITH PROPOFOL;  Surgeon: Rachael Feeaniel P Jacobs, MD;  Location: [facility];  Service: Endoscopy;  Laterality: N/A;   HAND SURGERY     left; tendon repair   TONSILLECTOMY      Social History   Socioeconomic History   Marital status: Single    Spouse name: Not on file   Number of children: Not on file   Years of education: Not on file   Highest education level: Not on file  Occupational History   Not on file  Tobacco Use   Smoking status: Never Smoker   Smokeless tobacco: Never Used  Vaping Use   Vaping Use: Never used  Substance and Sexual Activity   Alcohol use: No   Drug use: No   Sexual activity: Not on file  Other Topics Concern   Not on file  Social History Narrative   Not on file   Social Determinants of Health   Financial Resource Strain:    Difficulty of Paying Living Expenses: Not on file  Food Insecurity: No Food Insecurity   Worried About Running Out of Food in the Last Year: Never  true   Ran Out of Food in the Last Year: Never true  Transportation Needs: No Transportation Needs   Lack of Transportation (Medical): No   Lack of Transportation (Non-Medical): No  Physical Activity:    Days of Exercise per Week: Not on file   Minutes of Exercise per Session: Not on file  Stress:    Feeling of Stress : Not on file  Social Connections:    Frequency of Communication with Friends and Family: Not on file   Frequency of Social Gatherings with Friends and Family: Not on file   Attends Religious Services: Not on file   Active Member of  Clubs or Organizations: Not on file   Attends Banker Meetings: Not on file   Marital Status: Not on file  Intimate Partner Violence:    Fear of Current or Ex-Partner: Not on file   Emotionally Abused: Not on file   Physically Abused: Not on file   Sexually Abused: Not on file    Family History  Problem Relation Age of Onset   Heart failure Mother    Hepatitis Mother        C   Dementia Mother    COPD Mother        emphysema   Cancer Father        lung with mets to brain   Diabetes Sister    Hepatitis Brother    Cancer Sister        leukemia   Colon cancer Neg Hx      Current Outpatient Medications:    acetaminophen (TYLENOL) 500 MG tablet, Take 500 mg by mouth every 6 (six) hours as needed for mild pain. , Disp: , Rfl:    albuterol (PROAIR HFA) 108 (90 Base) MCG/ACT inhaler, Inhale 2 puffs into the lungs every 6 (six) hours as needed for wheezing., Disp: 18 g, Rfl: 1   albuterol (PROVENTIL) (2.5 MG/3ML) 0.083% nebulizer solution, Take 3 mLs (2.5 mg total) by nebulization every 6 (six) hours as needed for wheezing or shortness of breath., Disp: 75 mL, Rfl: 1   amLODipine (NORVASC) 5 MG tablet, Take 5 mg by mouth 2 (two) times daily. , Disp: , Rfl:    BREO ELLIPTA 200-25 MCG/INH AEPB, Inhale 1 puff by mouth once daily, Disp: 60 each, Rfl: 0   lisinopril (ZESTRIL) 5 MG tablet, Take 5 mg by mouth daily., Disp: , Rfl:    omeprazole (PRILOSEC) 20 MG capsule, Take 20 mg by mouth daily., Disp: , Rfl:    amoxicillin-clavulanate (AUGMENTIN) 875-125 MG tablet, Take 1 tablet by mouth 2 (two) times daily. (Patient not taking: Reported on 11/28/2020), Disp: 14 tablet, Rfl: 0   HYDROcodone-acetaminophen (NORCO) 5-325 MG tablet, Take 1 tablet by mouth every 6 (six) hours as needed. (Patient not taking: Reported on 11/28/2020), Disp: 12 tablet, Rfl: 0  Allergies  Allergen Reactions   Avelox [Moxifloxacin Hcl In Nacl]     Caused tachycardia   Codeine  Hives and Other (See Comments)    Hallucination,    Penicillins Hives    No anaphylaxis/SJS symptoms, > 10 yr ago, no hospitalization required States she has tolerated amoxicillin     History reviewed: allergies, current medications, past family history, past medical history, past social history, past surgical history and problem list  Chronic issues discussed: Hypertension-compliant with lisinopril 5 mg and amlodipine 5 mg daily  Asthma-compliant with albuterol and Breo, doing better from recent flareup in the past few weeks  GERD-doing fine  on 20 mg omeprazole  She sees her specialist regularly for kidney, Raynaud's, scleroderma  Acute issues discussed: none  Objective:      Biometrics BP 108/60    Ht 5\' 5"  (1.651 m)    Wt 104 lb 6.4 oz (47.4 kg)    BMI 17.37 kg/m   Cognitive Testing  Alert? Yes  Normal Appearance?Yes  Oriented to person? Yes  Place? Yes   Time? Yes  Recall of three objects?  Yes  Can perform simple calculations? Yes  Displays appropriate judgment?Yes  Can read the correct time from a watch face?Yes  General appearance: alert, no distress, WD/WN, caucasian female  Nutritional Status: Inadequate calore intake? yes Loss of muscle mass? no Loss of fat beneath skin? no Localized or general edema? no Diminished functional status? no  Other pertinent exam: Tight appearing skin of fingers, face/mouth externally c/w scleroderma HEENT: normocephalic, sclerae anicteric, TMs pearly, nares patent, no discharge or erythema, pharynx normal Oral cavity: small airway, MMM, no lesions Neck: supple, no lymphadenopathy, no thyromegaly, no masses no bruits Heart: 2/6 brief systolic murmur heard best in upper left sternal border, RRR, normal S1, S2 Lungs: CTA bilaterally, no wheezes, rhonchi, or rales Abdomen: +bs, soft, non tender, non distended, no masses, no hepatomegaly, no splenomegaly Musculoskeletal: nontender, no swelling, no obvious  deformity Extremities: no edema, no cyanosis, no clubbing Pulses: 2+ symmetric, upper and lower extremities, normal cap refill Neurological: alert, oriented x 3, CN2-12 intact, strength normal upper extremities and lower extremities, sensation normal throughout, DTRs 2+ throughout, no cerebellar signs, gait normal Psychiatric: normal affect, behavior normal, pleasant  Breast/gyn/rectal - deferred to gyn    Assessment:   Encounter Diagnoses  Name Primary?   Encounter for health maintenance examination in adult Yes   Medicare annual wellness visit, subsequent    Atherosclerosis    Raynaud's disease without gangrene    Chronic sinusitis, unspecified location    Intrinsic asthma    Recurrent pneumonia    Colitis    ESOPHAGEAL STRICTURE    Gastroesophageal reflux disease without esophagitis    Metabolic bone disease    Anemia of renal disease    CRI (chronic renal insufficiency), stage 3 (moderate) (HCC)    Hypertensive nephropathy    Hiatal hernia    Estrogen deficiency    History of colitis    History of ischemic colitis    Long term systemic steroid user    Scleroderma (HCC)    Encounter for screening mammogram for malignant neoplasm of breast    Vaccine counseling    Post-menopausal    Underweight    Advanced directives, counseling/discussion    Screening for lipid disorders    Encounter for hepatitis C screening test for low risk patient    Abnormal thyroid blood test      Plan:   A preventative services visit was completed today.  During the course of the visit today, we discussed and counseled about appropriate screening and preventive services.  A health risk assessment was established today that included a review of current medications, allergies, social history, family history, medical and preventative health history, biometrics, and preventative screenings to identify potential safety concerns or impairments.  Today you had a  preventative care visit or wellness visit.    Topics today may have included healthy lifestyle, diet, exercise, preventative care, vaccinations, sick and well care, proper use of emergency dept and after hours care, as well as other concerns.     Recommendations: Continue to return yearly  for your annual wellness and preventative care visits.  This gives [place] a chance to discuss healthy lifestyle, exercise, vaccinations, review your chart record, and perform screenings where appropriate.  I recommend you see your eye doctor yearly for routine vision care.  I recommend you see your dentist yearly for routine dental care including hygiene visits twice yearly.   Vaccination recommendations were reviewed You are up to date on Pneumococcal 23 vaccine You are up to date on Covid initial vaccine   I recommend a tetanus booster I recommend Prevnar 13 pneumococcal vaccine I recommend an updated pneumococcal 23 vaccine I recommend Shingles vaccine I recommend a yearly flu shot  Check your insurer to make sure these vaccines above are covered.  Also check with your insurer to find out where you can have these vaccines done.  Some of these insurers do not allow vaccine that the actual doctor's office   Screening for cancer: Breast cancer screening: You should perform a self breast exam monthly.   We reviewed recommendations for regular mammograms and breast cancer screening. I have placed an order for mammogram.  Please call  imaging to schedule this and the bone density test You are past due for this.  Colon cancer screening:  I reviewed your colonoscopy on file from 2017 with Dr. Christella Hartigan  Cervical cancer screening: We reviewed recommendations for pap smear screening. Continue plan to see [facility] soon for updated pap smear  Skin cancer screening: Check your skin regularly for new changes, growing lesions, or other lesions of concern Come in for evaluation if you have  skin lesions of concern.  Lung cancer screening: If you have a greater than 30 pack year history of tobacco use, then you qualify for lung cancer screening with a chest CT scan  We currently don't have screenings for other cancers besides breast, cervical, colon, and lung cancers.  If you have a strong family history of cancer or have other cancer screening concerns, please let me know.    Bone health: Get at least 150 minutes of aerobic exercise weekly Get weight bearing exercise at least once weekly I placed an order electronically.  Please call the breast center to schedule your mammogram and bone density test  Please call to schedule your mammogram and bone density test.   [facility]  [id number] [address] [place], [place] [address]    Heart health: Get at least 150 minutes of aerobic exercise weekly Limit alcohol It is important to maintain a healthy blood pressure and healthy cholesterol numbers I reviewed recent 2020 echocardiogram   Separate significant issues discussed: We discussed Advanced Directives.  Complete the living will and health care power of attorney and get me a copy.   Chronic kidney disease, hypertensive nephropathy-requested recent nephrology notes from [facility]  Asthma, long-term steroid use-sees pulmonology, reviewed recent pulmonology notes  Anemia of renal disease-updated labs today  Scleroderma, Raynaud's -sees rheumatology  History of colitis, screen for colon cancer-I reached out to Dr. Christella Hartigan to inquire when she is due for next colonoscopy  Chronically low BMI-unchanged, work on good healthy variety of nutrients including fruits, vegetables, nuts, whole grains  Metabolic bone disease-advised updated bone density test  Atherosclerosis-reviewed 2020 echocardiogram.  Recommend a statin  History of abnormal thyroid lab-repeat labs today  [place] was seen today for medicare  wellness.  Diagnoses and all orders for this visit:  Encounter for health maintenance examination in adult -  TSH -     Comprehensive metabolic panel -     CBC with Differential/Platelet -     Lipid panel -     Hepatitis C antibody -     MM Digital Diagnostic Bilat; Future -     DG Bone Density; Future  Medicare annual wellness visit, subsequent  Atherosclerosis  Raynaud's disease without gangrene  Chronic sinusitis, unspecified location  Intrinsic asthma  Recurrent pneumonia  Colitis  ESOPHAGEAL STRICTURE  Gastroesophageal reflux disease without esophagitis  Metabolic bone disease  Anemia of renal disease -     CBC with Differential/Platelet  CRI (chronic renal insufficiency), stage 3 (moderate) (HCC)  Hypertensive nephropathy  Hiatal hernia  Estrogen deficiency  History of colitis  History of ischemic colitis  Long term systemic steroid user  Scleroderma (HCC)  Encounter for screening mammogram for malignant neoplasm of breast -     MM Digital Diagnostic Bilat; Future  Vaccine counseling  Post-menopausal -     DG Bone Density; Future  Underweight  Advanced directives, counseling/discussion  Screening for lipid disorders -     Lipid panel  Encounter for hepatitis C screening test for low risk patient -     Hepatitis C antibody  Abnormal thyroid blood test -     TSH    Medicare Attestation A preventative services visit was completed today.  During the course of the visit the patient was educated and counseled about appropriate screening and preventive services.  A health risk assessment was established with the patient that included a review of current medications, allergies, social history, family history, medical and preventative health history, biometrics, and preventative screenings to identify potential safety concerns or impairments.  A personalized plan was printed today for the patient's records and use.   Personalized health  advice and education was given today to reduce health risks and promote self management and wellness.  Information regarding end of life planning was discussed today.  [name], PA-C   11/28/2020

## 2020-11-29 ENCOUNTER — Other Ambulatory Visit: Payer: Self-pay | Admitting: Medical

## 2020-11-29 ENCOUNTER — Telehealth: Payer: Self-pay

## 2020-11-29 LAB — COMPREHENSIVE METABOLIC PANEL
ALT: 8 IU/L (ref 0–32)
AST: 21 IU/L (ref 0–40)
Albumin/Globulin Ratio: 1.7 (ref 1.2–2.2)
Albumin: 4.6 g/dL (ref 3.8–4.8)
Alkaline Phosphatase: 56 IU/L (ref 44–121)
BUN/Creatinine Ratio: 15 (ref 12–28)
BUN: 21 mg/dL (ref 8–27)
Bilirubin Total: 0.3 mg/dL (ref 0.0–1.2)
CO2: 24 mmol/L (ref 20–29)
Calcium: 9.4 mg/dL (ref 8.7–10.3)
Chloride: 97 mmol/L (ref 96–106)
Creatinine, Ser: 1.38 mg/dL — ABNORMAL HIGH (ref 0.57–1.00)
GFR calc Af Amer: 47 mL/min/{1.73_m2} — ABNORMAL LOW (ref >59)
GFR calc non Af Amer: 41 mL/min/{1.73_m2} — ABNORMAL LOW (ref >59)
Globulin, Total: 2.7 g/dL (ref 1.5–4.5)
Glucose: 83 mg/dL (ref 65–99)
Potassium: 4.8 mmol/L (ref 3.5–5.2)
Sodium: 136 mmol/L (ref 134–144)
Total Protein: 7.3 g/dL (ref 6.0–8.5)

## 2020-11-29 LAB — CBC WITH DIFFERENTIAL/PLATELET
Basophils Absolute: 0.1 10*3/uL (ref 0.0–0.2)
Basos: 2 %
EOS (ABSOLUTE): 0.6 10*3/uL — ABNORMAL HIGH (ref 0.0–0.4)
Eos: 11 %
Hematocrit: 38.1 % (ref 34.0–46.6)
Hemoglobin: 12.3 g/dL (ref 11.1–15.9)
Immature Grans (Abs): 0 10*3/uL (ref 0.0–0.1)
Immature Granulocytes: 0 %
Lymphocytes Absolute: 1.7 10*3/uL (ref 0.7–3.1)
Lymphs: 30 %
MCH: 31.1 pg (ref 26.6–33.0)
MCHC: 32.3 g/dL (ref 31.5–35.7)
MCV: 96 fL (ref 79–97)
Monocytes Absolute: 0.5 10*3/uL (ref 0.1–0.9)
Monocytes: 10 %
Neutrophils Absolute: 2.6 10*3/uL (ref 1.4–7.0)
Neutrophils: 47 %
Platelets: 228 10*3/uL (ref 150–450)
RBC: 3.96 x10E6/uL (ref 3.77–5.28)
RDW: 11.5 % — ABNORMAL LOW (ref 11.7–15.4)
WBC: 5.6 10*3/uL (ref 3.4–10.8)

## 2020-11-29 LAB — LIPID PANEL
Chol/HDL Ratio: 3.4 ratio (ref 0.0–4.4)
Cholesterol, Total: 168 mg/dL (ref 100–199)
HDL: 50 mg/dL (ref >39)
LDL Chol Calc (NIH): 99 mg/dL (ref 0–99)
Triglycerides: 103 mg/dL (ref 0–149)
VLDL Cholesterol Cal: 19 mg/dL (ref 5–40)

## 2020-11-29 LAB — HEPATITIS C ANTIBODY: Hep C Virus Ab: <0.1 s/co ratio (ref 0.0–0.9)

## 2020-11-29 LAB — TSH: TSH: 4.11 u[IU]/mL (ref 0.450–4.500)

## 2020-11-29 MED ORDER — ALBUTEROL SULFATE HFA 108 (90 BASE) MCG/ACT IN AERS: 2.0000 | INHALATION_SPRAY | Freq: Four times a day (QID) | RESPIRATORY_TRACT | 1 refills | Status: DC | PRN

## 2020-11-29 MED ORDER — ROSUVASTATIN CALCIUM 10 MG PO TABS: 10.0000 mg | ORAL_TABLET | Freq: Every day | ORAL | 3 refills | Status: DC

## 2020-11-29 MED ORDER — BREO ELLIPTA 200-25 MCG/INH IN AEPB: INHALATION_SPRAY | RESPIRATORY_TRACT | 5 refills | Status: DC

## 2020-11-29 NOTE — Telephone Encounter (Signed)
Recall entered in epic. 

## 2020-11-29 NOTE — Telephone Encounter (Signed)
-----   Message from Hilarie Fredrickson, MD sent at 11/29/2020  9:11 AM EST ----- My patient had colonoscopy in the hospital in 2017 with Dr. Christella Hartigan.  The examination was complete with excellent preparation.  Based on this information her follow-up colonoscopy should be 2027.  Bonita Quin, please make recall colonoscopy for 2027.Thank you,Dr. Marina Goodell ----- Message ----- From: Rachael Fee, MD Sent: 11/29/2020   8:01 AM EST To: Hilarie Fredrickson, MD, Kermit Balo Tysinger, PA-C  Hey, thank you for reaching out.  She is actually a primary GI patient of Dr. Marina Goodell here in our office.  Sorry, I know it is kind of confusing when reviewing her records.  I will forward this to him for his input.  Thanks  Jonny Ruiz, FYI below.  ----- Message ----- From: Genia Del Sent: 11/28/2020  10:45 AM EST To: Rachael Fee, MD  Hello Dr. Christella Hartigan  I hope you are doing well.  Can you look and see when Ms. Molchan is due for repeat colonoscopy  Thanks [name], PA-C

## 2020-12-02 ENCOUNTER — Encounter: Payer: Self-pay | Admitting: Primary Care

## 2020-12-02 ENCOUNTER — Ambulatory Visit (INDEPENDENT_AMBULATORY_CARE_PROVIDER_SITE_OTHER): Payer: Medicare PPO

## 2020-12-02 ENCOUNTER — Other Ambulatory Visit: Payer: Self-pay

## 2020-12-02 ENCOUNTER — Ambulatory Visit: Payer: Medicare PPO | Admitting: Primary Care

## 2020-12-02 VITALS — BP 98/64 | HR 85 | Ht 65.0 in | Wt 104.4 lb

## 2020-12-02 DIAGNOSIS — Z23 Encounter for immunization: Secondary | ICD-10-CM | POA: Diagnosis not present

## 2020-12-02 DIAGNOSIS — J189 Pneumonia, unspecified organism: Secondary | ICD-10-CM | POA: Diagnosis not present

## 2020-12-02 DIAGNOSIS — R079 Chest pain, unspecified: Secondary | ICD-10-CM

## 2020-12-02 DIAGNOSIS — J90 Pleural effusion, not elsewhere classified: Secondary | ICD-10-CM | POA: Diagnosis not present

## 2020-12-02 DIAGNOSIS — J45909 Unspecified asthma, uncomplicated: Secondary | ICD-10-CM

## 2020-12-02 LAB — CBC WITH DIFFERENTIAL/PLATELET
Basophils Absolute: 0.1 10*3/uL (ref 0.0–0.1)
Basophils Relative: 1.3 % (ref 0.0–3.0)
Eosinophils Absolute: 0.9 10*3/uL — ABNORMAL HIGH (ref 0.0–0.7)
Eosinophils Relative: 11.1 % — ABNORMAL HIGH (ref 0.0–5.0)
HCT: 36.8 % (ref 36.0–46.0)
Hemoglobin: 12.1 g/dL (ref 12.0–15.0)
Lymphocytes Relative: 18.9 % (ref 12.0–46.0)
Lymphs Abs: 1.6 10*3/uL (ref 0.7–4.0)
MCHC: 32.9 g/dL (ref 30.0–36.0)
MCV: 95.5 fl (ref 78.0–100.0)
Monocytes Absolute: 0.6 10*3/uL (ref 0.1–1.0)
Monocytes Relative: 7.5 % (ref 3.0–12.0)
Neutro Abs: 5.1 10*3/uL (ref 1.4–7.7)
Neutrophils Relative %: 61.2 % (ref 43.0–77.0)
Platelets: 237 10*3/uL (ref 150.0–400.0)
RBC: 3.85 Mil/uL — ABNORMAL LOW (ref 3.87–5.11)
RDW: 12.6 % (ref 11.5–15.5)
WBC: 8.3 10*3/uL (ref 4.0–10.5)

## 2020-12-02 NOTE — Assessment & Plan Note (Addendum)
-   Stable; Continue Breo Elipta 1 puff daily  - Checking CBC with diff and IgE

## 2020-12-02 NOTE — Progress Notes (Signed)
@Patient  ID: , female    DOB: [birth year], 64 y.o.   MRN: [id number]  Chief Complaint  Patient presents with  . Follow-up    Chest xray performed today.  Pt states she is feeling better than she did at last visit. States she still has some SOB and also has an occ cough.    Referring provider: [id number], PA-C  HPI: 64 year of female, never smoked. Past medical history significant for right upper lobe pneumonia, pleural effusion, intrinsic asthma, chronic sinusitis, Raynaud's disease, GERD, esophageal stricture, chronic renal insufficiency stage III, scleroderma, breast cancer. Patient of Dr. 63.  Patient has a history of abnormal CT scan.  Interstitial lung disease not in UIP or NSIP pattern.  During felt to be related to recurrent pneumonias.  She was last admitted to the hospital in December 2020 for pneumonia.  Required thoracentesis twice in December when admitted for pneumonia, fluid was transudate of.  Pleural effusion resolved on chest x-ray in February 2021.  Patient to maintain aspiration and reflux precaution.  She is on Breo for asthma.   Previous [facility] pulmonary encounter: 11/04/2020 Patient presents today with reports of increased back pain.  Dyspnea is baseline.  Stat chest x-ray today showed patchy areas of airspace disease bilaterally, increased from prior chest x-ray. No pleural effusion. Two weeks ago she had a dry cough. After drinking a hot beverage she is able to get up some clear mucus. No overt episodes of aspiration. Pain with deep breath, mianly on the right side. Denies fever, chills, shortness of breath, chest congestion, cough.   12/02/2020- Interim hx  Patient presents today for 1 month follow-up pneumonia. She is feeling a good deal better. Dyspnea has improved. She has an occasional coughing fit but nothing day to day. She continues to have some intermittent posterior back pain. She denies overt aspiration. She only experiences cough after  drinking hot beverages. Continues BREO 1 puff daily. Rare SABA use.   Imaging: 05/30/20 CT chest wo contrast- 1) Resolution of ground-glass and septal thickening but with development of numerous nodules in the bilateral upper lobes some areas of nodularity. Suspect that this is post infectious or inflammatory scarring with some areas suggesting active inflammation or infection. Aspiration related changes are also considered at the lung bases.  2) Patulous partially fluid-filled in debris-filled esophagus less fluid-filled than but still with debris on the current study likely related to esophageal dysmotility in the setting of scleroderma.This could contribute to aspiration.  11/04/20 CXR - Lungs are hyperexpanded. Interstitial markings are diffusely coarsened with chronic features. Patchy areas of airspace disease are seen in the upper lungs bilaterally and right base. No pleural effusion or pneumothorax  12/02/20 CXR- Patchy opacities bilaterally with similar appearance compared to the CT study of May 30, 2020 and without gross change compared to 11/04/2020. Findings could represent sequela of infection superimposed on emphysema or perhaps even sequela of prior COVID-19 related infection  Allergies  Allergen Reactions  . Avelox [Moxifloxacin Hcl In Nacl]     Caused tachycardia  . Codeine Hives and Other (See Comments)    Hallucination,   . Penicillins Hives    No anaphylaxis/SJS symptoms, > 10 yr ago, no hospitalization required States she has tolerated amoxicillin     Immunization History  Administered Date(s) Administered  . H1N1 12/25/2008  . Influenza Split 11/12/2015, 11/07/2016  . Influenza Whole 09/27/2009  . Influenza,inj,Quad PF,6+ Mos 10/30/2017, 10/29/2018, 10/27/2019  . PFIZER SARS-COV-2 Vaccination 04/05/2020, 05/03/2020  .  Pneumococcal Polysaccharide-23 12/04/2011    Past Medical History:  Diagnosis Date  . Allergic rhinitis   . Asthma   . Atherosclerosis   .  Chronic kidney disease (CKD), stage III (moderate) (HCC)   . Esophageal stricture   . GERD (gastroesophageal reflux disease)   . History of anemia due to chronic kidney disease   . History of colitis 2017  . Hypertension   . Metabolic bone disease   . Raynaud disease   . Scleroderma (HCC)     Tobacco History: Social History   Tobacco Use  Smoking Status Never Smoker  Smokeless Tobacco Never Used   Counseling given: Not Answered   Outpatient Medications Prior to Visit  Medication Sig Dispense Refill  . acetaminophen (TYLENOL) 500 MG tablet Take 500 mg by mouth every 6 (six) hours as needed for mild pain.     [name] albuterol (PROAIR HFA) 108 (90 Base) MCG/ACT inhaler Inhale 2 puffs into the lungs every 6 (six) hours as needed for wheezing. 18 g 1  . albuterol (PROVENTIL) (2.5 MG/3ML) 0.083% nebulizer solution Take 3 mLs (2.5 mg total) by nebulization every 6 (six) hours as needed for wheezing or shortness of breath. 75 mL 1  . amLODipine (NORVASC) 5 MG tablet Take 5 mg by mouth daily.     . fluticasone furoate-vilanterol (BREO ELLIPTA) 200-25 MCG/INH AEPB Inhale 1 puff by mouth once daily 28 each 5  . HYDROcodone-acetaminophen (NORCO) 5-325 MG tablet Take 1 tablet by mouth every 6 (six) hours as needed. 12 tablet 0  . lisinopril (ZESTRIL) 5 MG tablet Take 5 mg by mouth daily.    [name] omeprazole (PRILOSEC) 20 MG capsule Take 20 mg by mouth daily.    . rosuvastatin (CRESTOR) 10 MG tablet Take 1 tablet (10 mg total) by mouth daily. (Patient not taking: Reported on 12/02/2020) 90 tablet 3  . amoxicillin-clavulanate (AUGMENTIN) 875-125 MG tablet Take 1 tablet by mouth 2 (two) times daily. (Patient not taking: Reported on 11/28/2020) 14 tablet 0   No facility-administered medications prior to visit.   Review of Systems  Review of Systems  Constitutional: Negative.   HENT: Negative.   Respiratory: Positive for cough. Negative for chest tightness, shortness of breath and wheezing.    Cardiovascular: Negative.   Musculoskeletal: Positive for back pain.   Physical Exam  BP 98/64 (BP Location: Left Arm, Cuff Size: Normal)   Pulse 85   Ht 5\' 5"  (1.651 m)   Wt 104 lb 6.4 oz (47.4 kg)   SpO2 100%   BMI 17.37 kg/m  Physical Exam Constitutional:      Appearance: Normal appearance.  HENT:     Head: Normocephalic and atraumatic.     Mouth/Throat:     Mouth: Mucous membranes are moist.     Pharynx: Oropharynx is clear.     Comments: Small oral cavity  Cardiovascular:     Rate and Rhythm: Normal rate and regular rhythm.  Pulmonary:     Effort: Pulmonary effort is normal.     Breath sounds: Normal breath sounds. No rhonchi or rales.     Comments: CTA Musculoskeletal:        General: Normal range of motion.  Skin:    General: Skin is warm and dry.  Neurological:     General: No focal deficit present.     Mental Status: She is alert and oriented to person, place, and time. Mental status is at baseline.  Psychiatric:        Mood  and Affect: Mood normal.        Behavior: Behavior normal.        Thought Content: Thought content normal.        Judgment: Judgment normal.      Lab Results:  CBC    Component Value Date/Time   WBC 5.6 11/28/2020 1129   WBC 4.9 12/08/2019 0414   RBC 3.96 11/28/2020 1129   RBC 2.69 (L) 12/08/2019 0414   HGB 12.3 11/28/2020 1129   HCT 38.1 11/28/2020 1129   PLT 228 11/28/2020 1129   MCV 96 11/28/2020 1129   MCH 31.1 11/28/2020 1129   MCH 27.9 12/08/2019 0414   MCHC 32.3 11/28/2020 1129   MCHC 31.6 12/08/2019 0414   RDW 11.5 (L) 11/28/2020 1129   LYMPHSABS 1.7 11/28/2020 1129   MONOABS 0.7 12/08/2019 0414   EOSABS 0.6 (H) 11/28/2020 1129   BASOSABS 0.1 11/28/2020 1129    BMET    Component Value Date/Time   NA 136 11/28/2020 1129   K 4.8 11/28/2020 1129   CL 97 11/28/2020 1129   CO2 24 11/28/2020 1129   GLUCOSE 83 11/28/2020 1129   GLUCOSE 90 12/08/2019 0414   BUN 21 11/28/2020 1129   CREATININE 1.38 (H)  11/28/2020 1129   CALCIUM 9.4 11/28/2020 1129   GFRNONAA 41 (L) 11/28/2020 1129   GFRAA 47 (L) 11/28/2020 1129    BNP    Component Value Date/Time   BNP 137.0 (H) 12/01/2019 1841    ProBNP    Component Value Date/Time   PROBNP >3200.0 (H) 02/04/2010 0540    Imaging: DG Chest 2 View  Result Date: 12/02/2020 CLINICAL DATA:  Multifocal pneumonia follow-up EXAM: CHEST - 2 VIEW COMPARISON:  November 04, 2020, May 30, 2020 FINDINGS: Trachea midline. Cardiomediastinal contours elongated with hyperinflation. Patchy opacities bilaterally with similar appearance accounting for differences in technique compared to the CT study of May 30, 2020 and without gross change compared to 11/04/2020. No sign of pleural effusion. On limited assessment no acute skeletal process. IMPRESSION: Patchy opacities bilaterally with similar appearance compared to the CT study of May 30, 2020 and without gross change compared to 11/04/2020. Findings could represent sequela of infection superimposed on emphysema or perhaps even sequela of prior COVID-19 related infection. Given nodular appearance would suggest follow-up chest CT to assess for any changes. No lobar consolidative process or sign of effusion. Electronically Signed   By: Donzetta Kohut [name]   On: 12/02/2020 14:04   DG Chest 2 View  Result Date: 11/04/2020 CLINICAL DATA:  History of pleural effusion.  Lung pain. EXAM: CHEST - 2 VIEW COMPARISON:  02/08/2020 FINDINGS: Lungs are hyperexpanded. Interstitial markings are diffusely coarsened with chronic features. Patchy areas of airspace disease are seen in the upper lungs bilaterally and right base. No pleural effusion or pneumothorax. The cardiopericardial silhouette is within normal limits for size. Probable intra-articular loose bodies in the right shoulder. IMPRESSION: Hyperexpansion with patchy areas of airspace disease bilaterally, increased from prior chest x-ray. Multifocal pneumonia would be a consideration.  Close follow-up recommended. Electronically Signed   By: Kennith Center [name]   On: 11/04/2020 14:40     Assessment & Plan:   Recurrent pneumonia - Patient was treated for suspected multifocal pneumonia in early November 2021 with course of oral Augmentin. She has had past abnormal CT imaging, interstitial lung disease not in UIP or NSIP patter. Felt to be d/t reccuerrent PNA, possible aspiration. She is clinically feeling better. She has very rare  coughing fits and intermittent posterior back pain. Repeat CXR today showed patchy opacities bilaterally similar in appearance to previous. Needs CT chest wo contrast. Checking sputum culture and D-dimer. FU in 3 months with Dr. Delton CoombesByrum.   Intrinsic asthma - Stable; Continue Breo Elipta 1 puff daily  - Checking CBC with diff and IgE   Glenford BayleyElizabeth W Braylie Badami, NP 12/02/2020

## 2020-12-02 NOTE — Assessment & Plan Note (Addendum)
-   Patient was treated for suspected multifocal pneumonia in early November 2021 with course of oral Augmentin. She has had past abnormal CT imaging, interstitial lung disease not in UIP or NSIP patter. Felt to be d/t reccuerrent PNA, possible aspiration. She is clinically feeling better. She has very rare coughing fits and intermittent posterior back pain. Repeat CXR today showed patchy opacities bilaterally similar in appearance to previous. Needs CT chest wo contrast. Checking sputum culture and D-dimer. FU in 3 months with Dr. Delton Coombes.

## 2020-12-02 NOTE — Patient Instructions (Addendum)
Office treatment: High dose influenza vaccine re: hx asthma/recurrent pneumonia   Order: Labs (ordered) Sputum culture (ordered) CT chest wo contrast re: recurrent pneumonia (ordered)  Follow-up: 3 months with Dr. Delton Coombes

## 2020-12-03 LAB — IGE: IgE (Immunoglobulin E), Serum: 36 kU/L (ref <=114)

## 2020-12-03 LAB — D-DIMER, QUANTITATIVE: D-Dimer, Quant: 0.56 mcg/mL FEU — ABNORMAL HIGH (ref <0.50)

## 2020-12-03 NOTE — Progress Notes (Signed)
CT chest cancelled and CTA ordered.  Nothing further needed.

## 2020-12-03 NOTE — Addendum Note (Signed)
Addended by: FULTON-BRAME, Jshaun Abernathy N on: 12/03/2020 03:03 PM   Modules accepted: Orders  

## 2020-12-03 NOTE — Progress Notes (Signed)
Can you cancel CT chest and order CTA? D-dimer was slightly elevated, would rather this imaging to look at both the lungs and rule out a blood clot

## 2020-12-04 NOTE — Addendum Note (Signed)
Addended by: Glenford Bayley on: 12/04/2020 11:45 AM   Modules accepted: Orders

## 2020-12-05 ENCOUNTER — Inpatient Hospital Stay: Admission: RE | Admit: 2020-12-05 | Payer: Medicare PPO | Source: Ambulatory Visit

## 2020-12-17 ENCOUNTER — Other Ambulatory Visit: Payer: Self-pay | Admitting: Medical

## 2020-12-17 DIAGNOSIS — R928 Other abnormal and inconclusive findings on diagnostic imaging of breast: Secondary | ICD-10-CM

## 2020-12-23 ENCOUNTER — Other Ambulatory Visit: Payer: Self-pay

## 2020-12-23 ENCOUNTER — Ambulatory Visit
Admission: RE | Admit: 2020-12-23 | Discharge: 2020-12-23 | Disposition: A | Discharge status: Home / Self Care | Payer: Medicare PPO | Source: Ambulatory Visit | Attending: Primary Care | Admitting: Primary Care

## 2020-12-23 ENCOUNTER — Other Ambulatory Visit: Payer: Medicare PPO

## 2020-12-23 DIAGNOSIS — R7989 Other specified abnormal findings of blood chemistry: Secondary | ICD-10-CM | POA: Diagnosis not present

## 2020-12-23 DIAGNOSIS — R079 Chest pain, unspecified: Secondary | ICD-10-CM

## 2020-12-23 MED ORDER — IOPAMIDOL (ISOVUE-370) INJECTION 76%
75.0000 mL | Freq: Once | INTRAVENOUS | Status: AC | PRN
  Administered 2020-12-23: 16:00:00 75 mL via INTRAVENOUS

## 2020-12-30 ENCOUNTER — Other Ambulatory Visit: Payer: Self-pay | Admitting: Medical

## 2020-12-30 NOTE — Telephone Encounter (Signed)
Is this okay to refill? 

## 2021-01-10 NOTE — Progress Notes (Signed)
Please let patient know CT showed no evidence of PE. Resolution of multifocal lung nodules and cleaning of left upper lobe since prior exam. No new or progressive nodules. She has some scarring in lungs from previous infection. Chronic findings of esophagus related scleroderma.

## 2021-01-28 ENCOUNTER — Telehealth: Payer: Self-pay | Admitting: Medical

## 2021-01-28 NOTE — Telephone Encounter (Signed)
Per Dr. Marina Goodell, her next colonoscpoy should be 2027  Lets try and close her other gaps.   Hilarie Fredrickson, MD  Rachael Fee, MD; Aleen Campi Particia Jasper, RN My patient had colonoscopy in the hospital in 2017 with Dr. Christella Hartigan. The examination was complete with excellent preparation. Based on this information her follow-up colonoscopy should be 2027. Bonita Quin, please make recall colonoscopy for 2027. Thank you, Dr. Marina Goodell

## 2021-01-29 ENCOUNTER — Other Ambulatory Visit: Payer: Self-pay

## 2021-01-29 NOTE — Patient Instructions (Signed)
Goals    . [facility]-Make and Keep All Appointments     Timeframe:  Long-Range Goal Priority:  High Start Date:     01/29/21                        Expected End Date:   08/27/21                    Follow Up Date 05/27/21   - keep a calendar with appointment dates    Why is this important?    Part of staying healthy is seeing the doctor for follow-up care.   If you forget your appointments, there are some things you can do to stay on track.    Notes: Continue physician appointments as scheduled.      [name] and Manage My Blood Pressure-Hypertension     Timeframe:  Long-Range Goal Priority:  High Start Date:   01/29/21                          Expected End Date:  08/27/21                     Follow Up Date 05/27/21    - check blood pressure daily    Why is this important?    You won't feel high blood pressure, but it can still hurt your blood vessels.   High blood pressure can cause heart or kidney problems. It can also cause a stroke.   Making lifestyle changes like losing a little weight or eating less salt will help.   Checking your blood pressure at home and at different times of the day can help to control blood pressure.   If the doctor prescribes medicine remember to take it the way the doctor ordered.   Call the office if you cannot afford the medicine or if there are questions about it.     Notes: Continue to check blood pressure and write in a log for physicians.

## 2021-01-29 NOTE — Telephone Encounter (Signed)
Noted  

## 2021-01-29 NOTE — Patient Outreach (Signed)
[facility]) Care Management  [facility] Care Manager  01/29/2021   [name] [birth year] [id number]  Subjective: Telephone call to patient. She is doing good.  She reports her blood pressure has been good with the highest 102/60. Patient denies any problems with dizziness.  Discussed BP management and following up with physicians.  She verbalized understanding.     Objective:   Encounter Medications:  Outpatient Encounter Medications as of 01/29/2021  Medication Sig Note  . BREO ELLIPTA 200-25 MCG/INH AEPB Inhale 1 puff by mouth once daily   . acetaminophen (TYLENOL) 500 MG tablet Take 500 mg by mouth every 6 (six) hours as needed for mild pain.    [name] albuterol (PROAIR HFA) 108 (90 Base) MCG/ACT inhaler Inhale 2 puffs into the lungs every 6 (six) hours as needed for wheezing.   [name] albuterol (PROVENTIL) (2.5 MG/3ML) 0.083% nebulizer solution Take 3 mLs (2.5 mg total) by nebulization every 6 (six) hours as needed for wheezing or shortness of breath.   [name] amLODipine (NORVASC) 5 MG tablet Take 5 mg by mouth daily.  08/01/2020: Taking once a day  . HYDROcodone-acetaminophen (NORCO) 5-325 MG tablet Take 1 tablet by mouth every 6 (six) hours as needed.   [name] lisinopril (ZESTRIL) 5 MG tablet Take 5 mg by mouth daily.   [name] omeprazole (PRILOSEC) 20 MG capsule Take 20 mg by mouth daily.   . rosuvastatin (CRESTOR) 10 MG tablet Take 1 tablet (10 mg total) by mouth daily. (Patient not taking: Reported on 12/02/2020)   . [DISCONTINUED] famotidine (PEPCID) 20 MG tablet Take 20 mg by mouth 2 (two) times daily.      No facility-administered encounter medications on file as of 01/29/2021.    Functional Status:  In your present state of health, do you have any difficulty performing the following activities: 01/29/2021 11/28/2020  Hearing? N N  Vision? N N  Difficulty concentrating or making decisions? N N  Walking or climbing stairs? N N  Dressing or bathing? N N  Doing errands, shopping? N N   Preparing Food and eating ? N N  Using the Toilet? N N  In the past six months, have you accidently leaked urine? N N  Do you have problems with loss of bowel control? N N  Managing your Medications? N N  Managing your Finances? N N  Housekeeping or managing your Housekeeping? N N  Some recent data might be hidden    Fall/Depression Screening: Fall Risk  01/29/2021 11/28/2020 08/01/2020  Falls in the past year? 0 0 0  Follow up - Falls evaluation completed -   PHQ 2/9 Scores 01/29/2021 11/28/2020 08/01/2020 12/19/2019  PHQ - 2 Score 0 0 0 1    Assessment: Patient managing chronic conditions.   Goals Addressed            This Visit's Progress   . THN-Make and Keep All Appointments       Timeframe:  Long-Range Goal Priority:  High Start Date:     01/29/21                        Expected End Date:   08/27/21                    Follow Up Date 05/27/21   - keep a calendar with appointment dates    Why is this important?    Part of staying healthy is seeing the doctor for follow-up  care.   If you forget your appointments, there are some things you can do to stay on track.    Notes: Continue physician appointments as scheduled.      Denman George and Manage My Blood Pressure-Hypertension       Timeframe:  Long-Range Goal Priority:  High Start Date:   01/29/21                          Expected End Date:  08/27/21                     Follow Up Date 05/27/21    - check blood pressure daily    Why is this important?    You won't feel high blood pressure, but it can still hurt your blood vessels.   High blood pressure can cause heart or kidney problems. It can also cause a stroke.   Making lifestyle changes like losing a little weight or eating less salt will help.   Checking your blood pressure at home and at different times of the day can help to control blood pressure.   If the doctor prescribes medicine remember to take it the way the doctor ordered.   Call the office if you  cannot afford the medicine or if there are questions about it.     Notes: Continue to check blood pressure and write in a log for physicians.         Plan: RN CM will follow up in May. Follow-up:  Patient agrees to Care Plan and Follow-up.   [name], RN, MSN [facility] Care Management Care Management Coordinator Direct Line [phone] Cell [phone] Toll Free: [phone]  Fax: [phone]

## 2021-02-14 DIAGNOSIS — N1832 Chronic kidney disease, stage 3b: Secondary | ICD-10-CM | POA: Diagnosis not present

## 2021-02-19 DIAGNOSIS — D631 Anemia in chronic kidney disease: Secondary | ICD-10-CM | POA: Diagnosis not present

## 2021-02-19 DIAGNOSIS — I129 Hypertensive chronic kidney disease with stage 1 through stage 4 chronic kidney disease, or unspecified chronic kidney disease: Secondary | ICD-10-CM | POA: Diagnosis not present

## 2021-02-19 DIAGNOSIS — N1832 Chronic kidney disease, stage 3b: Secondary | ICD-10-CM | POA: Diagnosis not present

## 2021-02-19 DIAGNOSIS — E889 Metabolic disorder, unspecified: Secondary | ICD-10-CM | POA: Diagnosis not present

## 2021-02-19 DIAGNOSIS — M9080 Osteopathy in diseases classified elsewhere, unspecified site: Secondary | ICD-10-CM | POA: Diagnosis not present

## 2021-04-03 ENCOUNTER — Other Ambulatory Visit: Payer: Self-pay

## 2021-04-03 ENCOUNTER — Ambulatory Visit: Payer: Medicare PPO | Admitting: Emergency Medicine

## 2021-04-03 ENCOUNTER — Encounter: Payer: Self-pay | Admitting: Emergency Medicine

## 2021-04-03 DIAGNOSIS — J310 Chronic rhinitis: Secondary | ICD-10-CM

## 2021-04-03 DIAGNOSIS — J189 Pneumonia, unspecified organism: Secondary | ICD-10-CM

## 2021-04-03 DIAGNOSIS — J45909 Unspecified asthma, uncomplicated: Secondary | ICD-10-CM | POA: Diagnosis not present

## 2021-04-03 NOTE — Assessment & Plan Note (Signed)
Continue fluticasone nasal spray.

## 2021-04-03 NOTE — Progress Notes (Signed)
Subjective:    Patient ID: [name], female    DOB: [birth year], 65 y.o.   MRN: [id number]  HPI  ROV 02/08/20 --65 year old woman with a history of scleroderma and Raynaud's (previously on methotrexate), hypertension, chronic kidney disease, GERD and longstanding asthma with documented obstruction by spirometry 2010.  Multiple prior pneumonias, suspected sequela from aspiration with some scar.  She has been seen for an abnormal CT chest that showed this but no evidence of a UIP or NSIP pattern.  She was unfortunately admitted between 12/4 and 12/11 for possible aspiration pneumonia, viral gastroenteritis.  She had a left effusion that was tapped twice and was found to be transudative.  She completed a course of oral Augmentin.  Repeat chest x-ray done today reviewed by me shows hyperinflation, no pneumothorax or effusion, stable interstitial changes consistent with scarring. Her breathing is much improved - back to baseline. She is on Breo, uses albuterol very rarely now.   Last Ct chest was 12/05/2019, review and shows B GGI consistent with pneumonitis / PNA.   ROV 04/03/21 --this is a follow-up visit for 65 year old woman with a history of scleroderma, Raynaud's phenomenon previously on methotrexate.  Also with hypertension, CKD, GERD.  We follow her for longstanding asthma with documented obstruction on spirometry.  She is also had multiple prior pneumonias from aspiration, some associated scar by chest imaging.  She was seen in our office in November and December 2021 with back discomfort, some increased bilateral infiltrates on chest x-ray for which she was treated with Augmentin for presumed recurrent pneumonia.  CT scan of the chest on 12/23/2020 reviewed by me, showed no evidence of pulmonary embolism, dilated fluid-filled esophagus without esophageal mass, resolutions of some apical upper lobe nodularity and groundglass infiltrate.  There were scattered areas of subpleural reticulation and  multifocal lung scar, possible peripheral bronchiolectasis all unchanged. Today she reports that she is feeling better, is breathing better. She is having some B intermittent low back pain. She has minimal cough, no mucous.  She is managed on Breo, Flonase, Prilosec once daily.  Albuterol which she uses very rarely.  She is on lisinopril  MDM: Reviewed pulmonary notes from 11/04/2020, 12/02/2020   Review of Systems  Constitutional: Negative for fever and unexpected weight change.  HENT: Negative for congestion, dental problem, ear pain, nosebleeds, postnasal drip, rhinorrhea, sinus pressure, sneezing, sore throat and trouble swallowing.   Eyes: Negative for redness and itching.  Respiratory: Negative for cough, chest tightness, shortness of breath and wheezing.   Cardiovascular: Negative for palpitations and leg swelling.  Gastrointestinal: Negative for nausea and vomiting.  Genitourinary: Negative for dysuria.  Musculoskeletal: Negative for joint swelling.  Skin: Negative for rash.  Neurological: Negative for headaches.  Hematological: Does not bruise/bleed easily.  Psychiatric/Behavioral: Negative for dysphoric mood. The patient is not nervous/anxious.     Past Medical History:  Diagnosis Date  . Allergic rhinitis   . Asthma   . Atherosclerosis   . Chronic kidney disease (CKD), stage III (moderate) (HCC)   . Esophageal stricture   . GERD (gastroesophageal reflux disease)   . History of anemia due to chronic kidney disease   . History of colitis 2017  . Hypertension   . Metabolic bone disease   . Raynaud disease   . Scleroderma (HCC)      Family History  Problem Relation Age of Onset  . Heart failure Mother   . Hepatitis Mother        C  .  Dementia Mother   . COPD Mother        emphysema  . Cancer Father        lung with mets to brain  . Diabetes Sister   . Hepatitis Brother   . Cancer Sister        leukemia  . Colon cancer Neg Hx      Social History    Socioeconomic History  . Marital status: Single    Spouse name: Not on file  . Number of children: Not on file  . Years of education: Not on file  . Highest education level: Not on file  Occupational History  . Not on file  Tobacco Use  . Smoking status: Never Smoker  . Smokeless tobacco: Never Used  Vaping Use  . Vaping Use: Never used  Substance and Sexual Activity  . Alcohol use: No  . Drug use: No  . Sexual activity: Not on file  Other Topics Concern  . Not on file  Social History Narrative  . Not on file   Social Determinants of Health   Financial Resource Strain: Not on file  Food Insecurity: No Food Insecurity  . Worried About Programme researcher, broadcasting/film/video in the Last Year: Never true  . Ran Out of Food in the Last Year: Never true  Transportation Needs: No Transportation Needs  . Lack of Transportation (Medical): No  . Lack of Transportation (Non-Medical): No  Physical Activity: Not on file  Stress: Not on file  Social Connections: Not on file  Intimate Partner Violence: Not on file     Allergies  Allergen Reactions  . Avelox [Moxifloxacin Hcl In Nacl]     Caused tachycardia  . Codeine Hives and Other (See Comments)    Hallucination,   . Penicillins Hives    No anaphylaxis/SJS symptoms, > 10 yr ago, no hospitalization required States she has tolerated amoxicillin      Outpatient Medications Prior to Visit  Medication Sig Dispense Refill  . acetaminophen (TYLENOL) 325 MG tablet Take 325 mg by mouth every 6 (six) hours as needed for mild pain.    [name] albuterol (PROAIR HFA) 108 (90 Base) MCG/ACT inhaler Inhale 2 puffs into the lungs every 6 (six) hours as needed for wheezing. 18 g 1  . amLODipine (NORVASC) 5 MG tablet Take 5 mg by mouth daily.     [name] BREO ELLIPTA 200-25 MCG/INH AEPB Inhale 1 puff by mouth once daily 60 each 11  . fluticasone (FLONASE) 50 MCG/ACT nasal spray Place 1 spray into both nostrils daily.    [name] lisinopril (ZESTRIL) 5 MG tablet Take 5 mg by  mouth daily.    [name] omeprazole (PRILOSEC) 20 MG capsule Take 20 mg by mouth daily.    . pseudoephedrine (SUDAFED) 30 MG tablet Take 30 mg by mouth every 6 (six) hours as needed for congestion.    [name] albuterol (PROVENTIL) (2.5 MG/3ML) 0.083% nebulizer solution Take 3 mLs (2.5 mg total) by nebulization every 6 (six) hours as needed for wheezing or shortness of breath. 75 mL 1  . HYDROcodone-acetaminophen (NORCO) 5-325 MG tablet Take 1 tablet by mouth every 6 (six) hours as needed. 12 tablet 0  . rosuvastatin (CRESTOR) 10 MG tablet Take 1 tablet (10 mg total) by mouth daily. (Patient not taking: Reported on 12/02/2020) 90 tablet 3   No facility-administered medications prior to visit.        Objective:   Physical Exam Vitals:   04/03/21 1614  BP: [name])  100/56  Pulse: 74  Temp: 98.3 F (36.8 C)  TempSrc: Temporal  SpO2: 98%  Weight: 109 lb 6.4 oz (49.6 kg)  Height: 5\' 5"  (1.651 m)   Gen: Pleasant, thin, in no distress,  normal affect  ENT: No lesions,  mouth clear,  oropharynx clear, no postnasal drip  Neck: No JVD, no stridor  Lungs: No use of accessory muscles, no crackles.  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  Continue to practice swallowing and aspiration precautions  Intrinsic asthma Well-managed on Breo, plan to continue.  Minimal albuterol use.  Chronic rhinitis Continue fluticasone nasal spray  12/2020, MD, PhD 04/03/2021, 4:53 PM [facility] [phone] or if no answer [phone]

## 2021-04-03 NOTE — Assessment & Plan Note (Signed)
Well-managed on [place], plan to continue.  Minimal albuterol use.

## 2021-04-03 NOTE — Patient Instructions (Signed)
Please continue Breo 1 inhalation once daily.  Rinse and gargle after using. Keep your albuterol available to use 2 puffs when needed for shortness of breath, chest tightness, wheezing. Continue your fluticasone nasal spray (Flonase) 2 sprays each nostril once daily. Continue your omeprazole (Prilosec) 20 mg once daily. Continue to practice your swallowing precautions and aspiration precautions. Follow with nephrology and rheumatology as planned Follow with Dr. Delton Coombes in 12 months or sooner if you have any problems.

## 2021-04-03 NOTE — Assessment & Plan Note (Signed)
She has recovered from her most recent pneumonia and November 2021, improvement on her CT chest from 11/2020.  Continue to practice swallowing and aspiration precautions

## 2021-04-10 DIAGNOSIS — J45909 Unspecified asthma, uncomplicated: Secondary | ICD-10-CM | POA: Diagnosis not present

## 2021-04-10 DIAGNOSIS — I7300 Raynaud's syndrome without gangrene: Secondary | ICD-10-CM | POA: Diagnosis not present

## 2021-04-10 DIAGNOSIS — D649 Anemia, unspecified: Secondary | ICD-10-CM | POA: Diagnosis not present

## 2021-04-10 DIAGNOSIS — I351 Nonrheumatic aortic (valve) insufficiency: Secondary | ICD-10-CM | POA: Diagnosis not present

## 2021-04-10 DIAGNOSIS — M79643 Pain in unspecified hand: Secondary | ICD-10-CM | POA: Diagnosis not present

## 2021-04-10 DIAGNOSIS — L299 Pruritus, unspecified: Secondary | ICD-10-CM | POA: Diagnosis not present

## 2021-04-10 DIAGNOSIS — M349 Systemic sclerosis, unspecified: Secondary | ICD-10-CM | POA: Diagnosis not present

## 2021-04-10 DIAGNOSIS — N189 Chronic kidney disease, unspecified: Secondary | ICD-10-CM | POA: Diagnosis not present

## 2021-04-30 ENCOUNTER — Other Ambulatory Visit: Payer: Self-pay

## 2021-04-30 NOTE — Patient Outreach (Signed)
[facility]) Care Management  04/30/2021  [name] November 16, 1956 [id number]   Telephone call to patient for disease management follow up.   No answer.  HIPAA compliant voice message left.    Plan: If no return call, RN CM will attempt patient again in the month of August.   Caitlyn Faust J Ellory Khurana, RN, MSN [facility] Care Management Care Management Coordinator Direct Line [phone] Cell [phone] Toll Free: [phone]  Fax: [phone]

## 2021-05-05 ENCOUNTER — Telehealth: Payer: Self-pay

## 2021-05-05 ENCOUNTER — Encounter: Payer: Self-pay | Admitting: Medical

## 2021-05-05 ENCOUNTER — Other Ambulatory Visit: Payer: Self-pay

## 2021-05-05 ENCOUNTER — Other Ambulatory Visit: Payer: Medicare PPO

## 2021-05-05 ENCOUNTER — Telehealth: Payer: Medicare PPO | Admitting: Medical

## 2021-05-05 VITALS — Ht 64.5 in | Wt 108.0 lb

## 2021-05-05 DIAGNOSIS — J40 Bronchitis, not specified as acute or chronic: Secondary | ICD-10-CM

## 2021-05-05 DIAGNOSIS — J329 Chronic sinusitis, unspecified: Secondary | ICD-10-CM | POA: Diagnosis not present

## 2021-05-05 DIAGNOSIS — J4541 Moderate persistent asthma with (acute) exacerbation: Secondary | ICD-10-CM | POA: Diagnosis not present

## 2021-05-05 MED ORDER — CLARITHROMYCIN 500 MG PO TABS: 500.0000 mg | ORAL_TABLET | Freq: Two times a day (BID) | ORAL | 0 refills | Status: DC

## 2021-05-05 MED ORDER — ALBUTEROL SULFATE (2.5 MG/3ML) 0.083% IN NEBU: 2.5000 mg | INHALATION_SOLUTION | Freq: Four times a day (QID) | RESPIRATORY_TRACT | 3 refills | Status: DC | PRN

## 2021-05-05 MED ORDER — PREDNISONE 10 MG PO TABS: ORAL_TABLET | ORAL | 0 refills | Status: DC

## 2021-05-05 NOTE — Progress Notes (Signed)
Subjective:     Patient ID: [name], female   DOB: 13-Aug-1956, 65 y.o.   MRN: [id number]  This visit type was conducted due to national recommendations for restrictions regarding the COVID-19 Pandemic (e.g. social distancing) in an effort to limit this patient's exposure and mitigate transmission in our community.  Due to their co-morbid illnesses, this patient is at least at moderate risk for complications without adequate follow up.  This format is felt to be most appropriate for this patient at this time.    Documentation for virtual audio and video telecommunications through [place] encounter:  The patient was located at home. The provider was located in the office. The patient did consent to this visit and is aware of possible charges through their insurance for this visit.  The other persons participating in this telemedicine service were none. Time spent on call was 20 minutes and in review of previous records 20 minutes total.  This virtual service is not related to other E/M service within previous 7 days.   HPI Chief Complaint  Patient presents with  . Cough    Cough with headache and stuffy nose    Virtual consult for illness.  Started 3 weeks ago, and has continued with sinus pressure, congestion, thick mucous.  Feels like she has had low grade fever. Had clear mucous initially but now colored.  Her albuterol for neb has expired.   Has neb machine at home.   Has some tightness and burning in chest.  Has mild soreness in roof of mouth.  initially had dry hacky cough.  No change in smell or taste.  No NVD.   Using sudafed some, limited per order from kidney doctor.   No sick contacts.   No recent covid test.   No other aggravating or relieving factors. No other complaint.   Past Medical History:  Diagnosis Date  . Allergic rhinitis   . Asthma   . Atherosclerosis   . Chronic kidney disease (CKD), stage III (moderate) (HCC)   . Esophageal stricture   . GERD  (gastroesophageal reflux disease)   . History of anemia due to chronic kidney disease   . History of colitis 2017  . Hypertension   . Metabolic bone disease   . Raynaud disease   . Scleroderma (HCC)    Current Outpatient Medications on File Prior to Visit  Medication Sig Dispense Refill  . acetaminophen (TYLENOL) 325 MG tablet Take 325 mg by mouth every 6 (six) hours as needed for mild pain.    [name] albuterol (PROAIR HFA) 108 (90 Base) MCG/ACT inhaler Inhale 2 puffs into the lungs every 6 (six) hours as needed for wheezing. 18 g 1  . amLODipine (NORVASC) 5 MG tablet Take 5 mg by mouth daily.     [name] BREO ELLIPTA 200-25 MCG/INH AEPB Inhale 1 puff by mouth once daily 60 each 11  . fluticasone (FLONASE) 50 MCG/ACT nasal spray Place 1 spray into both nostrils daily.    [name] lisinopril (ZESTRIL) 5 MG tablet Take 5 mg by mouth daily.    [name] omeprazole (PRILOSEC) 20 MG capsule Take 20 mg by mouth daily.    . pseudoephedrine (SUDAFED) 30 MG tablet Take 30 mg by mouth every 6 (six) hours as needed for congestion.    . [DISCONTINUED] famotidine (PEPCID) 20 MG tablet Take 20 mg by mouth 2 (two) times daily.       No current facility-administered medications on file prior to visit.    Review  of Systems As in subjective    Objective:   Physical Exam Due to coronavirus pandemic stay at home measures, patient visit was virtual and they were not examined in person.   Gen: wd, wn, nad No obvious wheezing or labored breathing, answers questions in complete sentences     Assessment:     Encounter Diagnoses  Name Primary?  . Moderate persistent asthma with acute exacerbation Yes  . Sinobronchitis        Plan:     We discussed her symptoms and concerns.  She has a history of chronic bronchitis/asthmatic bronchitis.   She will come in for COVID testing to help screen for COVID infection.  In the meantime discussed supportive care, rest, hydration, begin Biaxin.  I refilled her nebulizer albuterol.  I  sent prednisone in the event she does not see improvements in the next 3 days.  She has a tendency to get worse quicker.  Advise if worse or not improving over the next few days to recheck  [name] was seen today for cough.  Diagnoses and all orders for this visit:  Moderate persistent asthma with acute exacerbation  Sinobronchitis  Other orders -     albuterol (PROVENTIL) (2.5 MG/3ML) 0.083% nebulizer solution; Take 3 mLs (2.5 mg total) by nebulization every 6 (six) hours as needed for wheezing or shortness of breath. -     clarithromycin (BIAXIN) 500 MG tablet; Take 1 tablet (500 mg total) by mouth 2 (two) times daily. -     predniSONE (DELTASONE) 10 MG tablet; 6 tablets day 1, 5 tablets day 2, 4 tablets day 3, 3 tablets day 4, 2 tablets day 5, 1 tablet day 6  f/u for covid testing today in our back parking lot

## 2021-05-05 NOTE — Telephone Encounter (Signed)
Pt. Called back stating her home covid test was Negative.

## 2021-05-06 ENCOUNTER — Telehealth: Payer: Medicare PPO | Admitting: Medical

## 2021-07-18 ENCOUNTER — Ambulatory Visit: Payer: Medicare PPO | Admitting: Cardiovascular Disease

## 2021-07-18 ENCOUNTER — Encounter: Payer: Self-pay | Admitting: Cardiovascular Disease

## 2021-07-18 ENCOUNTER — Other Ambulatory Visit: Payer: Self-pay

## 2021-07-18 VITALS — BP 130/76 | HR 73 | Ht 64.0 in | Wt 111.8 lb

## 2021-07-18 DIAGNOSIS — I10 Essential (primary) hypertension: Secondary | ICD-10-CM | POA: Diagnosis not present

## 2021-07-18 DIAGNOSIS — I351 Nonrheumatic aortic (valve) insufficiency: Secondary | ICD-10-CM

## 2021-07-18 NOTE — Assessment & Plan Note (Signed)
History of trace aortic insufficiency by her most recent 2D echo performed 06/16/2019.  She does have an AI murmur on exam however.  I am going to repeat a 2D echocardiogram.

## 2021-07-18 NOTE — Progress Notes (Signed)
07/18/2021 Hendricks [name]   Aug 10, 1956  [id number]  Primary Physician Tysinger, Kermit Balo, PA-C Primary Cardiologist: Caitlyn Gess MD FACP, [place], [place], [place]  HPI:  [name] is a 65 y.o. thin and chronically ill-appearing widowed Caucasian female mother of 2 daughters who has been on disability for many years.  She was referred by her primary care provider, Caitlyn Covey, PA-C for evaluation of an aortic insufficiency murmur.  Her only risk factor includes treated hypertension.  She does have scleroderma as well as reactive airways disease.  She apparently had an auscultated murmur.  Her last 2D echo performed 06/16/2019 showed trace AI.  She does have chronic shortness of breath because of reactive airways disease but denies chest pain.   Current Meds  Medication Sig   acetaminophen (TYLENOL) 325 MG tablet Take 325 mg by mouth every 6 (six) hours as needed for mild pain.   albuterol (PROAIR HFA) 108 (90 Base) MCG/ACT inhaler Inhale 2 puffs into the lungs every 6 (six) hours as needed for wheezing.   albuterol (PROVENTIL) (2.5 MG/3ML) 0.083% nebulizer solution Take 3 mLs (2.5 mg total) by nebulization every 6 (six) hours as needed for wheezing or shortness of breath.   amLODipine (NORVASC) 5 MG tablet Take 5 mg by mouth daily.    BREO ELLIPTA 200-25 MCG/INH AEPB Inhale 1 puff by mouth once daily   fluticasone (FLONASE) 50 MCG/ACT nasal spray Place 1 spray into both nostrils daily.   lisinopril (ZESTRIL) 5 MG tablet Take 5 mg by mouth daily.   omeprazole (PRILOSEC) 20 MG capsule Take 20 mg by mouth daily.   predniSONE (DELTASONE) 10 MG tablet 6 tablets day 1, 5 tablets day 2, 4 tablets day 3, 3 tablets day 4, 2 tablets day 5, 1 tablet day 6   pseudoephedrine (SUDAFED) 30 MG tablet Take 30 mg by mouth every 6 (six) hours as needed for congestion.     Allergies  Allergen Reactions   Avelox [Moxifloxacin Hcl In Nacl]     Caused tachycardia   Codeine Hives and Other (See  Comments)    Hallucination,    Penicillins Hives    No anaphylaxis/SJS symptoms, > 10 yr ago, no hospitalization required States she has tolerated amoxicillin     Social History   Socioeconomic History   Marital status: Single    Spouse name: Not on file   Number of children: Not on file   Years of education: Not on file   Highest education level: Not on file  Occupational History   Not on file  Tobacco Use   Smoking status: Never   Smokeless tobacco: Never  Vaping Use   Vaping Use: Never used  Substance and Sexual Activity   Alcohol use: No   Drug use: No   Sexual activity: Not on file  Other Topics Concern   Not on file  Social History Narrative   Not on file   Social Determinants of Health   Financial Resource Strain: Not on file  Food Insecurity: No Food Insecurity   Worried About Running Out of Food in the Last Year: Never true   Ran Out of Food in the Last Year: Never true  Transportation Needs: No Transportation Needs   Lack of Transportation (Medical): No   Lack of Transportation (Non-Medical): No  Physical Activity: Not on file  Stress: Not on file  Social Connections: Not on file  Intimate Partner Violence: Not on file     Review  of Systems: General: negative for chills, fever, night sweats or weight changes.  Cardiovascular: negative for chest pain, dyspnea on exertion, edema, orthopnea, palpitations, paroxysmal nocturnal dyspnea or shortness of breath Dermatological: negative for rash Respiratory: negative for cough or wheezing Urologic: negative for hematuria Abdominal: negative for nausea, vomiting, diarrhea, bright red blood per rectum, melena, or hematemesis Neurologic: negative for visual changes, syncope, or dizziness All other systems reviewed and are otherwise negative except as noted above.    Blood pressure 130/76, pulse 73, height 5\' 4"  (1.626 m), weight 111 lb 12.8 oz (50.7 kg), SpO2 96 %.  General appearance: alert and no  distress Neck: no adenopathy, no carotid bruit, no JVD, supple, symmetrical, trachea midline, and thyroid not enlarged, symmetric, no tenderness/mass/nodules Lungs: clear to auscultation bilaterally Heart: regular rate and rhythm, S1, S2 normal, no murmur, click, rub or gallop Extremities: extremities normal, atraumatic, no cyanosis or edema Pulses: 2+ and symmetric Skin: Skin color, texture, turgor normal. No rashes or lesions Neurologic: Grossly normal  EKG sinus rhythm at 74 with low limb voltage, left axis deviation and septal Q waves.  I personally reviewed this EKG.  ASSESSMENT AND PLAN:   Essential hypertension [name] has a history of essential hypertension.  Blood pressure today is 130/76.  She is on lisinopril and amlodipine.  Aortic insufficiency History of trace aortic insufficiency by her most recent 2D echo performed 06/16/2019.  She does have an AI murmur on exam however.  I am going to repeat a 2D echocardiogram.     06/18/2019 MD [facility], [facility] 07/18/2021 2:01 PM

## 2021-07-18 NOTE — Assessment & Plan Note (Signed)
[name] has a history of essential hypertension.  Blood pressure today is 130/76.  She is on lisinopril and amlodipine.

## 2021-07-18 NOTE — Patient Instructions (Signed)
Medication Instructions:  No Changes In Medications at this time.  *If you need a refill on your cardiac medications before your next appointment, please call your pharmacy*  Testing/Procedures: Your physician has requested that you have an echocardiogram. Echocardiography is a painless test that uses sound waves to create images of your heart. It provides your doctor with information about the size and shape of your heart and how well your heart's chambers and valves are working. You may receive an ultrasound enhancing agent through an IV if needed to better visualize your heart during the echo.This procedure takes approximately one hour. There are no restrictions for this procedure. This will take place at the [address], [address].   Follow-Up: At [facility], you and your health needs are our priority.  As part of our continuing mission to provide you with exceptional heart care, we have created designated Provider Care Teams.  These Care Teams include your primary Cardiologist (physician) and Advanced Practice Providers (APPs -  Physician Assistants and Nurse Practitioners) who all work together to provide you with the care you need, when you need it.  We recommend signing up for the patient portal called "MyChart".  Sign up information is provided on this After Visit Summary.  MyChart is used to connect with patients for Virtual Visits (Telemedicine).  Patients are able to view lab/test results, encounter notes, upcoming appointments, etc.  Non-urgent messages can be sent to your provider as well.   To learn more about what you can do with MyChart, go to [email or web address].    Your next appointment:   AS NEEDED   The format for your next appointment:   In Person  Provider:   Nanetta Batty, MD

## 2021-07-30 ENCOUNTER — Telehealth: Payer: Medicare PPO | Admitting: Medical

## 2021-07-30 ENCOUNTER — Other Ambulatory Visit: Payer: Self-pay

## 2021-07-30 VITALS — Wt 110.0 lb

## 2021-07-30 DIAGNOSIS — K047 Periapical abscess without sinus: Secondary | ICD-10-CM

## 2021-07-30 DIAGNOSIS — K1379 Other lesions of oral mucosa: Secondary | ICD-10-CM

## 2021-07-30 DIAGNOSIS — R509 Fever, unspecified: Secondary | ICD-10-CM

## 2021-07-30 MED ORDER — CLINDAMYCIN HCL 300 MG PO CAPS: 300.0000 mg | ORAL_CAPSULE | Freq: Three times a day (TID) | ORAL | 0 refills | Status: DC

## 2021-07-30 MED ORDER — HYDROCODONE-ACETAMINOPHEN 5-325 MG PO TABS: 1.0000 | ORAL_TABLET | Freq: Four times a day (QID) | ORAL | 0 refills | Status: DC | PRN

## 2021-07-30 NOTE — Progress Notes (Signed)
Subjective:     Patient ID: [name], female   DOB: [birth year], 65 y.o.   MRN: [id number]  This visit type was conducted due to national recommendations for restrictions regarding the COVID-19 Pandemic (e.g. social distancing) in an effort to limit this patient's exposure and mitigate transmission in our community.  Due to their co-morbid illnesses, this patient is at least at moderate risk for complications without adequate follow up.  This format is felt to be most appropriate for this patient at this time.    Documentation for virtual audio and video telecommunications through [facility] encounter:  The patient was located at home. The provider was located in the office. The patient did consent to this visit and is aware of possible charges through their insurance for this visit.  The other persons participating in this telemedicine service were none. Time spent on call was 20 minutes and in review of previous records 20 minutes total.  This virtual service is not related to other E/M service within previous 7 days.   HPI Chief Complaint  Patient presents with   Dental Pain    Toothache started yesterday and got worse this morning. Swollen on left side, top tooth.  Sees dentist on Monday.   Virtual consult for possible tooth abscess.  She called to establish care with a new dentist over the same issue.  They cannot get her in until Monday.  She will be a new patient there.  She notes to take over the last few days that is worsening gradually.  She is swollen over the area of pain.  Hurts to chew or touch.  She has felt some chills and has felt feverish.  No nausea or vomiting.  She does flush and breast regularly.  No other aggravating or relieving factors. No other complaint.  Past Medical History:  Diagnosis Date   Allergic rhinitis    Asthma    Atherosclerosis    Chronic kidney disease (CKD), stage III (moderate) (HCC)    Esophageal stricture    GERD (gastroesophageal  reflux disease)    History of anemia due to chronic kidney disease    History of colitis 2017   Hypertension    Metabolic bone disease    Raynaud disease    Scleroderma (HCC)    Current Outpatient Medications on File Prior to Visit  Medication Sig Dispense Refill   acetaminophen (TYLENOL) 325 MG tablet Take 325 mg by mouth every 6 (six) hours as needed for mild pain.     albuterol (PROAIR HFA) 108 (90 Base) MCG/ACT inhaler Inhale 2 puffs into the lungs every 6 (six) hours as needed for wheezing. 18 g 1   albuterol (PROVENTIL) (2.5 MG/3ML) 0.083% nebulizer solution Take 3 mLs (2.5 mg total) by nebulization every 6 (six) hours as needed for wheezing or shortness of breath. 75 mL 3   amLODipine (NORVASC) 5 MG tablet Take 5 mg by mouth daily.      BREO ELLIPTA 200-25 MCG/INH AEPB Inhale 1 puff by mouth once daily 60 each 11   fluticasone (FLONASE) 50 MCG/ACT nasal spray Place 1 spray into both nostrils daily.     lisinopril (ZESTRIL) 5 MG tablet Take 5 mg by mouth daily.     omeprazole (PRILOSEC) 20 MG capsule Take 20 mg by mouth daily.     pseudoephedrine (SUDAFED) 30 MG tablet Take 30 mg by mouth every 6 (six) hours as needed for congestion.     [DISCONTINUED] famotidine (PEPCID) 20 MG tablet Take  20 mg by mouth 2 (two) times daily.       No current facility-administered medications on file prior to visit.    Review of Systems As in subjective    Objective:   Physical Exam Due to coronavirus pandemic stay at home measures, patient visit was virtual and they were not examined in person.   Wt 110 lb (49.9 kg)   BMI 18.88 kg/m   Gen: wd, wn, nad     Assessment:     Encounter Diagnoses  Name Primary?   Fever, unspecified fever cause Yes   Mouth pain    Dental abscess        Plan:     Begin medication as below.  Use salt water gargles in the mouth and spit out.  Continue regular dental hygiene.  Follow-up with dentist on Monday as planned  [name] was seen today for dental  pain.  Diagnoses and all orders for this visit:  Fever, unspecified fever cause  Mouth pain  Dental abscess  Other orders -     clindamycin (CLEOCIN) 300 MG capsule; Take 1 capsule (300 mg total) by mouth 3 (three) times daily. -     HYDROcodone-acetaminophen (NORCO) 5-325 MG tablet; Take 1 tablet by mouth every 6 (six) hours as needed for moderate pain. F/u prn

## 2021-07-31 ENCOUNTER — Other Ambulatory Visit: Payer: Self-pay

## 2021-07-31 NOTE — Patient Outreach (Signed)
[facility]) Care Management  [facility] Care Manager  07/31/2021   [name] June 17, 1956 [id number]  Subjective: Telephone call to patient for disease management follow up. She reports that is doing but has a toothache. To see the dentist on 08/04/21. Patient reports blood pressure has been elevated due to pain. Highest reported is 130/73.  Advised patient to continue to monitor.  Discussed  hypertension management.  She voices no concerns.   Objective:   Encounter Medications:  Outpatient Encounter Medications as of 07/31/2021  Medication Sig Note   acetaminophen (TYLENOL) 325 MG tablet Take 325 mg by mouth every 6 (six) hours as needed for mild pain.    albuterol (PROAIR HFA) 108 (90 Base) MCG/ACT inhaler Inhale 2 puffs into the lungs every 6 (six) hours as needed for wheezing.    albuterol (PROVENTIL) (2.5 MG/3ML) 0.083% nebulizer solution Take 3 mLs (2.5 mg total) by nebulization every 6 (six) hours as needed for wheezing or shortness of breath.    amLODipine (NORVASC) 5 MG tablet Take 5 mg by mouth daily.  08/01/2020: Taking once a day   BREO ELLIPTA 200-25 MCG/INH AEPB Inhale 1 puff by mouth once daily    clindamycin (CLEOCIN) 300 MG capsule Take 1 capsule (300 mg total) by mouth 3 (three) times daily.    fluticasone (FLONASE) 50 MCG/ACT nasal spray Place 1 spray into both nostrils daily.    HYDROcodone-acetaminophen (NORCO) 5-325 MG tablet Take 1 tablet by mouth every 6 (six) hours as needed for moderate pain.    lisinopril (ZESTRIL) 5 MG tablet Take 5 mg by mouth daily.    omeprazole (PRILOSEC) 20 MG capsule Take 20 mg by mouth daily.    pseudoephedrine (SUDAFED) 30 MG tablet Take 30 mg by mouth every 6 (six) hours as needed for congestion.    [DISCONTINUED] famotidine (PEPCID) 20 MG tablet Take 20 mg by mouth 2 (two) times daily.      No facility-administered encounter medications on file as of 07/31/2021.    Functional Status:  In your present state of health, do  you have any difficulty performing the following activities: 07/31/2021 01/29/2021  Hearing? N N  Vision? N N  Difficulty concentrating or making decisions? N N  Walking or climbing stairs? N N  Dressing or bathing? N N  Doing errands, shopping? N N  Preparing Food and eating ? N N  Using the Toilet? N N  In the past six months, have you accidently leaked urine? N N  Do you have problems with loss of bowel control? N N  Managing your Medications? N N  Managing your Finances? N N  Housekeeping or managing your Housekeeping? N N  Some recent data might be hidden    Fall/Depression Screening: Fall Risk  07/31/2021 01/29/2021 11/28/2020  Falls in the past year? 0 0 0  Follow up - - Falls evaluation completed   PHQ 2/9 Scores 07/31/2021 01/29/2021 11/28/2020 08/01/2020 12/19/2019  PHQ - 2 Score 0 0 0 0 1    Assessment:   Care Plan Care Plan : Hypertension (Adult)  Updates made by Fleeta Emmer, RN since 07/31/2021 12:00 AM     Problem: Hypertension (Hypertension)      Long-Range Goal: Hypertension Monitored as evidenced by patient reporting blood pressure less than 120/80   Start Date: 01/29/2021  Expected End Date: 12/27/2021  This Visit's Progress: On track  Priority: High  Note:   Evidence-based guidance:   Encourage continued use of home blood pressure  monitoring and recording in blood pressure log; include symptoms of hypotension or potential medication side effects in log.  Review blood pressure measurements taken inside and outside of the provider office; establish baseline and monitor trends; compare to target ranges or patient goal.  Share overall cardiovascular risk with patient; encourage changes to lifestyle risk factors, including alcohol consumption, smoking, inadequate exercise, poor dietary habits and stress.   Notes:     Task: Identify and Monitor Blood Pressure Elevation   Due Date: 12/27/2021  Priority: Routine  Responsible User: Fleeta Emmer, RN  Note:   Care  Management Activities:    - depression screen reviewed - home or ambulatory blood pressure monitoring encouraged    Notes: Patient checks pressure daily.  Highest reading 102/60. 07/31/21 Patient reports blood pressure some higher when in pain due to toothache. Patient to see dentist on 08/04/21.  Discussed hypertension and diet.   Hypertension Management Discussed Take medications as ordered Limit salt intake. Eat plenty of fruits and vegetables Remain as active as possible Take blood pressure at least weekly at home and record in a notebook to take to doctor's visits.       Goals Addressed             This Visit's Progress    COMPLETED: THN-Make and Keep All Appointments   On track    Timeframe:  Long-Range Goal Priority:  High Start Date:     01/29/21                        Expected End Date:   08/27/21                    Follow Up Date 05/27/21   - keep a calendar with appointment dates    Why is this important?   Part of staying healthy is seeing the doctor for follow-up care.  If you forget your appointments, there are some things you can do to stay on track.    Notes: Continue physician appointments as scheduled.       THN-Track and Manage My Blood Pressure-Hypertension   On track    Barriers: None Timeframe:  Long-Range Goal Priority:  High Start Date:   01/29/21                          Expected End Date:  12/27/21                    Follow Up Date 10/30/21   - check blood pressure daily - write blood pressure results in a log or diary    Why is this important?   You won't feel high blood pressure, but it can still hurt your blood vessels.  High blood pressure can cause heart or kidney problems. It can also cause a stroke.  Making lifestyle changes like losing a little weight or eating less salt will help.  Checking your blood pressure at home and at different times of the day can help to control blood pressure.  If the doctor prescribes medicine remember to  take it the way the doctor ordered.  Call the office if you cannot afford the medicine or if there are questions about it.     Notes: Continue to check blood pressure and write in a log for physicians.   07/31/21 07/31/21 Patient reports blood pressure some higher when in pain due to toothache. Patient to  see dentist on 08/04/21.  Discussed hypertension and diet.   Hypertension Management Discussed Take medications as ordered Limit salt intake. Eat plenty of fruits and vegetables Remain as active as possible Take blood pressure at least weekly at home and record in a notebook to take to doctor's visits.         Plan:  Follow-up: Patient agrees to Care Plan and Follow-up. Follow-up in 3-4 month(s)  [name], RN, MSN [facility] Care Management Care Management Coordinator Direct Line [phone] Cell [phone] Toll Free: [phone]  Fax: [phone]

## 2021-07-31 NOTE — Patient Instructions (Signed)
Goals Addressed             This Visit's Progress    COMPLETED: THN-Make and Keep All Appointments   On track    Timeframe:  Long-Range Goal Priority:  High Start Date:     01/29/21                        Expected End Date:   08/27/21                    Follow Up Date 05/27/21   - keep a calendar with appointment dates    Why is this important?   Part of staying healthy is seeing the doctor for follow-up care.  If you forget your appointments, there are some things you can do to stay on track.    Notes: Continue physician appointments as scheduled.       THN-Track and Manage My Blood Pressure-Hypertension   On track    Barriers: None Timeframe:  Long-Range Goal Priority:  High Start Date:   01/29/21                          Expected End Date:  12/27/21                    Follow Up Date 10/30/21   - check blood pressure daily - write blood pressure results in a log or diary    Why is this important?   You won't feel high blood pressure, but it can still hurt your blood vessels.  High blood pressure can cause heart or kidney problems. It can also cause a stroke.  Making lifestyle changes like losing a little weight or eating less salt will help.  Checking your blood pressure at home and at different times of the day can help to control blood pressure.  If the doctor prescribes medicine remember to take it the way the doctor ordered.  Call the office if you cannot afford the medicine or if there are questions about it.     Notes: Continue to check blood pressure and write in a log for physicians.   07/31/21 07/31/21 Patient reports blood pressure some higher when in pain due to toothache. Patient to see dentist on 08/04/21.  Discussed hypertension and diet.   Hypertension Management Discussed Take medications as ordered Limit salt intake. Eat plenty of fruits and vegetables Remain as active as possible Take blood pressure at least weekly at home and record in a notebook to take  to doctor's visits.

## 2021-08-08 ENCOUNTER — Other Ambulatory Visit: Payer: Self-pay

## 2021-08-08 ENCOUNTER — Ambulatory Visit (HOSPITAL_COMMUNITY): Payer: Medicare PPO | Attending: Cardiovascular Disease

## 2021-08-08 DIAGNOSIS — I351 Nonrheumatic aortic (valve) insufficiency: Secondary | ICD-10-CM | POA: Insufficient documentation

## 2021-08-08 LAB — ECHOCARDIOGRAM COMPLETE
Area-P 1/2: 4.29 cm2
P 1/2 time: 412 msec
S' Lateral: 2.7 cm

## 2021-08-11 ENCOUNTER — Other Ambulatory Visit: Payer: Self-pay | Admitting: *Deleted

## 2021-08-11 ENCOUNTER — Encounter: Payer: Self-pay | Admitting: *Deleted

## 2021-08-11 DIAGNOSIS — I351 Nonrheumatic aortic (valve) insufficiency: Secondary | ICD-10-CM

## 2021-10-16 DIAGNOSIS — N189 Chronic kidney disease, unspecified: Secondary | ICD-10-CM | POA: Diagnosis not present

## 2021-10-16 DIAGNOSIS — M79643 Pain in unspecified hand: Secondary | ICD-10-CM | POA: Diagnosis not present

## 2021-10-16 DIAGNOSIS — R9389 Abnormal findings on diagnostic imaging of other specified body structures: Secondary | ICD-10-CM | POA: Diagnosis not present

## 2021-10-16 DIAGNOSIS — M349 Systemic sclerosis, unspecified: Secondary | ICD-10-CM | POA: Diagnosis not present

## 2021-10-16 DIAGNOSIS — I7300 Raynaud's syndrome without gangrene: Secondary | ICD-10-CM | POA: Diagnosis not present

## 2021-10-16 DIAGNOSIS — I351 Nonrheumatic aortic (valve) insufficiency: Secondary | ICD-10-CM | POA: Diagnosis not present

## 2021-10-16 DIAGNOSIS — D649 Anemia, unspecified: Secondary | ICD-10-CM | POA: Diagnosis not present

## 2021-10-30 ENCOUNTER — Other Ambulatory Visit: Payer: Self-pay

## 2021-10-30 NOTE — Patient Outreach (Signed)
[facility]) Care Management  [place]  10/30/2021   LAKINA MCINTIRE 1956/06/01 [id number]  Subjective: Telephone call to patient for disease management follow up. Patient reports she is doing well.  Hypertension management continues.  No concerns.    Objective:   Encounter Medications:  Outpatient Encounter Medications as of 10/30/2021  Medication Sig Note   acetaminophen (TYLENOL) 325 MG tablet Take 325 mg by mouth every 6 (six) hours as needed for mild pain.    albuterol (PROAIR HFA) 108 (90 Base) MCG/ACT inhaler Inhale 2 puffs into the lungs every 6 (six) hours as needed for wheezing.    albuterol (PROVENTIL) (2.5 MG/3ML) 0.083% nebulizer solution Take 3 mLs (2.5 mg total) by nebulization every 6 (six) hours as needed for wheezing or shortness of breath.    amLODipine (NORVASC) 5 MG tablet Take 5 mg by mouth daily.  08/01/2020: Taking once a day   BREO ELLIPTA 200-25 MCG/INH AEPB Inhale 1 puff by mouth once daily    clindamycin (CLEOCIN) 300 MG capsule Take 1 capsule (300 mg total) by mouth 3 (three) times daily.    fluticasone (FLONASE) 50 MCG/ACT nasal spray Place 1 spray into both nostrils daily.    HYDROcodone-acetaminophen (NORCO) 5-325 MG tablet Take 1 tablet by mouth every 6 (six) hours as needed for moderate pain.    lisinopril (ZESTRIL) 5 MG tablet Take 5 mg by mouth daily.    omeprazole (PRILOSEC) 20 MG capsule Take 20 mg by mouth daily.    pseudoephedrine (SUDAFED) 30 MG tablet Take 30 mg by mouth every 6 (six) hours as needed for congestion.    [DISCONTINUED] famotidine (PEPCID) 20 MG tablet Take 20 mg by mouth 2 (two) times daily.      No facility-administered encounter medications on file as of 10/30/2021.    Functional Status:  In your present state of health, do you have any difficulty performing the following activities: 07/31/2021 01/29/2021  Hearing? N N  Vision? N N  Difficulty concentrating or making decisions? N N  Walking or climbing  stairs? N N  Dressing or bathing? N N  Doing errands, shopping? N N  Preparing Food and eating ? N N  Using the Toilet? N N  In the past six months, have you accidently leaked urine? N N  Do you have problems with loss of bowel control? N N  Managing your Medications? N N  Managing your Finances? N N  Housekeeping or managing your Housekeeping? N N  Some recent data might be hidden    Fall/Depression Screening: Fall Risk  07/31/2021 01/29/2021 11/28/2020  Falls in the past year? 0 0 0  Follow up - - Falls evaluation completed   PHQ 2/9 Scores 07/31/2021 01/29/2021 11/28/2020 08/01/2020 12/19/2019  PHQ - 2 Score 0 0 0 0 1    Assessment:   Care Plan Care Plan : Hypertension (Adult)  Updates made by Jon Billings, RN since 10/30/2021 12:00 AM     Problem: Hypertension (Hypertension)      Long-Range Goal: Hypertension Monitored as evidenced by patient reporting blood pressure less than 120/80 Completed 10/30/2021  Start Date: 01/29/2021  Expected End Date: 12/27/2021  Recent Progress: On track  Priority: High  Note:   Evidence-based guidance:   Encourage continued use of home blood pressure monitoring and recording in blood pressure log; include symptoms of hypotension or potential medication side effects in log.  Review blood pressure measurements taken inside and outside of the provider office; establish  baseline and monitor trends; compare to target ranges or patient goal.  Share overall cardiovascular risk with patient; encourage changes to lifestyle risk factors, including alcohol consumption, smoking, inadequate exercise, poor dietary habits and stress.   Notes: 14/4/31 Duplicate goal    Task: Identify and Monitor Blood Pressure Elevation Completed 10/30/2021  Due Date: 12/27/2021  Priority: Routine  Responsible User: Jon Billings, RN  Note:   Care Management Activities:    - depression screen reviewed - home or ambulatory blood pressure monitoring encouraged    Notes:  Patient checks pressure daily.  Highest reading 102/60. 07/31/21 Patient reports blood pressure some higher when in pain due to toothache. Patient to see dentist on 08/04/21.  Discussed hypertension and diet.   Hypertension Management Discussed Take medications as ordered Limit salt intake. Eat plenty of fruits and vegetables Remain as active as possible Take blood pressure at least weekly at home and record in a notebook to take to doctor's visits.     Care Plan : RN  Care Manager Plan of Care  Updates made by Jon Billings, RN since 10/30/2021 12:00 AM     Problem: Chronic Disease Management and Care Coordination of Hypertension   Priority: High     Long-Range Goal: Development of Plan of care for management of Hypertension   Start Date: 10/30/2021  Priority: High  Note:   Current Barriers:  Knowledge Deficits related to plan of care for management of HTN Chronic Disease Management support and education needs related to HTN  RNCM Clinical Goal(s):  Patient will verbalize understanding of plan for management of HTN verbalize basic understanding of  HTN disease process and self health management plan for hypertension take all medications exactly as prescribed and will call provider for medication related questions continue to work with RN Care Manager to address care management and care coordination needs related to  HTN through collaboration with RN Care manager, provider, and care team.   Interventions: Provided education to patient re: stroke prevention, s/s of heart attack and stroke, DASH diet, complications of uncontrolled blood pressure Inter-disciplinary care team collaboration (see longitudinal plan of care) Evaluation of current treatment plan related to  self management and patient's adherence to plan as established by provider  Hypertension Interventions:  Last practice recorded BP readings:  BP Readings from Last 3 Encounters:  07/18/21 130/76  04/03/21 (!) 100/56   12/02/20 98/64  Patient reports recent blood pressure 100/60.   Most recent eGFR/CrCl: No results found for: EGFR  No components found for: CRCL  Evaluation of current treatment plan related to hypertension self management and patient's adherence to plan as established by provider; Provided education to patient re: stroke prevention, s/s of heart attack and stroke; Discussed plans with patient for ongoing care management follow up and provided patient with direct contact information for care management team;  Patient Goals/Self-Care Activities: Patient will self administer medications as prescribed Patient will continue to perform ADL's independently Patient will continue to perform IADL's independently Attends all scheduled provider appointments check blood pressure as advised by physician write blood pressure results in a log or diary learn about high blood pressure    Follow Up Plan:  Telephone follow up appointment with care management team member scheduled for:  February.       Goals Addressed             This Visit's Progress    COMPLETED: THN-Track and Manage My Blood Pressure-Hypertension       Barriers: None Timeframe:  Long-Range Goal Priority:  High Start Date:   01/29/21                          Expected End Date:  12/27/21                    Follow Up Date 10/30/21   - check blood pressure daily - write blood pressure results in a log or diary    Why is this important?   You won't feel high blood pressure, but it can still hurt your blood vessels.  High blood pressure can cause heart or kidney problems. It can also cause a stroke.  Making lifestyle changes like losing a little weight or eating less salt will help.  Checking your blood pressure at home and at different times of the day can help to control blood pressure.  If the doctor prescribes medicine remember to take it the way the doctor ordered.  Call the office if you cannot afford the medicine or  if there are questions about it.     Notes: Continue to check blood pressure and write in a log for physicians.   07/31/21 07/31/21 Patient reports blood pressure some higher when in pain due to toothache. Patient to see dentist on 08/04/21.  Discussed hypertension and diet.   Hypertension Management Discussed Take medications as ordered Limit salt intake. Eat plenty of fruits and vegetables Remain as active as possible Take blood pressure at least weekly at home and record in a notebook to take to doctor's visits.  76/7/01 duplicate goal        Plan:  Follow-up: Patient agrees to Care Plan and Follow-up. Follow-up in 3 month(s)  Jone Baseman, RN, MSN [facility] Care Management Coordinator Direct Line [phone] Cell [phone] Toll Free: [phone]  Fax: [phone]

## 2021-11-06 DIAGNOSIS — N1832 Chronic kidney disease, stage 3b: Secondary | ICD-10-CM | POA: Diagnosis not present

## 2021-11-06 DIAGNOSIS — M349 Systemic sclerosis, unspecified: Secondary | ICD-10-CM | POA: Diagnosis not present

## 2021-11-13 DIAGNOSIS — M898X9 Other specified disorders of bone, unspecified site: Secondary | ICD-10-CM | POA: Diagnosis not present

## 2021-11-13 DIAGNOSIS — D631 Anemia in chronic kidney disease: Secondary | ICD-10-CM | POA: Diagnosis not present

## 2021-11-13 DIAGNOSIS — I129 Hypertensive chronic kidney disease with stage 1 through stage 4 chronic kidney disease, or unspecified chronic kidney disease: Secondary | ICD-10-CM | POA: Diagnosis not present

## 2021-11-13 DIAGNOSIS — N1832 Chronic kidney disease, stage 3b: Secondary | ICD-10-CM | POA: Diagnosis not present

## 2021-11-25 ENCOUNTER — Telehealth: Payer: Self-pay | Admitting: Medical

## 2021-11-25 NOTE — Telephone Encounter (Signed)
Spoke with patient to schedule Medicare Annual Wellness Visit (AWV) either virtually or in office.  Patient stated she would call back to schedule  Last AWV 11/28/20  ; please schedule at anytime with health coach  This should be a 45 minute visit.

## 2021-12-05 ENCOUNTER — Ambulatory Visit (INDEPENDENT_AMBULATORY_CARE_PROVIDER_SITE_OTHER): Payer: Medicare PPO

## 2021-12-05 VITALS — Ht 65.0 in | Wt 113.0 lb

## 2021-12-05 DIAGNOSIS — Z0000 Encounter for general adult medical examination without abnormal findings: Secondary | ICD-10-CM

## 2021-12-05 NOTE — Progress Notes (Signed)
I connected with [name] today by telephone and verified that I am speaking with the correct person using two identifiers. Location patient: home Location provider: work Persons participating in the virtual visit: Nahla Bargman, Glenna Durand LPN.   I discussed the limitations, risks, security and privacy concerns of performing an evaluation and management service by telephone and the availability of in person appointments. I also discussed with the patient that there may be a patient responsible charge related to this service. The patient expressed understanding and verbally consented to this telephonic visit.    Interactive audio and video telecommunications were attempted between this provider and patient, however failed, due to patient having technical difficulties OR patient did not have access to video capability.  We continued and completed visit with audio only.     Vital signs may be patient reported or missing.  Subjective:   [name] is a 65 y.o. female who presents for Medicare Annual (Subsequent) preventive examination.  Review of Systems     Cardiac Risk Factors include: hypertension     Objective:    Today's Vitals   12/05/21 1326 12/05/21 1327  Weight: 113 lb (51.3 kg)   Height: 5\' 5"  (1.651 m)   PainSc:  6    Body mass index is 18.8 kg/m.  Advanced Directives 12/05/2021 12/19/2019 12/01/2019 04/26/2017 07/14/2016 07/10/2016 06/23/2016  Does Patient Have a Medical Advance Directive? No No No No No No No  Would patient like information on creating a medical advance directive? - No - Patient declined Yes (ED - Information included in AVS) No - Patient declined No - patient declined information No - patient declined information -    Current Medications (verified) Outpatient Encounter Medications as of 12/05/2021  Medication Sig   acetaminophen (TYLENOL) 325 MG tablet Take 325 mg by mouth every 6 (six) hours as needed for mild pain.   albuterol (PROAIR  HFA) 108 (90 Base) MCG/ACT inhaler Inhale 2 puffs into the lungs every 6 (six) hours as needed for wheezing.   albuterol (PROVENTIL) (2.5 MG/3ML) 0.083% nebulizer solution Take 3 mLs (2.5 mg total) by nebulization every 6 (six) hours as needed for wheezing or shortness of breath.   amLODipine (NORVASC) 5 MG tablet Take 5 mg by mouth daily.    BREO ELLIPTA 200-25 MCG/INH AEPB Inhale 1 puff by mouth once daily   fluticasone (FLONASE) 50 MCG/ACT nasal spray Place 1 spray into both nostrils daily.   HYDROcodone-acetaminophen (NORCO) 5-325 MG tablet Take 1 tablet by mouth every 6 (six) hours as needed for moderate pain.   lisinopril (ZESTRIL) 5 MG tablet Take 5 mg by mouth daily.   omeprazole (PRILOSEC) 20 MG capsule Take 20 mg by mouth daily.   pseudoephedrine (SUDAFED) 30 MG tablet Take 30 mg by mouth every 6 (six) hours as needed for congestion.   clindamycin (CLEOCIN) 300 MG capsule Take 1 capsule (300 mg total) by mouth 3 (three) times daily. (Patient not taking: Reported on 12/05/2021)   [DISCONTINUED] famotidine (PEPCID) 20 MG tablet Take 20 mg by mouth 2 (two) times daily.     No facility-administered encounter medications on file as of 12/05/2021.    Allergies (verified) Avelox [moxifloxacin hcl in nacl], Codeine, and Penicillins   History: Past Medical History:  Diagnosis Date   Allergic rhinitis    Asthma    Atherosclerosis    Chronic kidney disease (CKD), stage III (moderate) (HCC)    Esophageal stricture    GERD (gastroesophageal reflux disease)  History of anemia due to chronic kidney disease    History of colitis 2017   Hypertension    Metabolic bone disease    Raynaud disease    Scleroderma ([place])    Past Surgical History:  Procedure Laterality Date   CESAREAN SECTION     COLONOSCOPY WITH PROPOFOL N/A 07/14/2016   Procedure: COLONOSCOPY WITH PROPOFOL;  Surgeon: Milus Banister, MD;  Location: [facility];  Service: Endoscopy;  Laterality: N/A;   HAND SURGERY      left; tendon repair   TONSILLECTOMY     Family History  Problem Relation Age of Onset   Heart failure Mother    Hepatitis Mother        C   Dementia Mother    COPD Mother        emphysema   Cancer Father        lung with mets to brain   Diabetes Sister    Hepatitis Brother    Cancer Sister        leukemia   Colon cancer Neg Hx    Social History   Socioeconomic History   Marital status: Single    Spouse name: Not on file   Number of children: Not on file   Years of education: Not on file   Highest education level: Not on file  Occupational History   Not on file  Tobacco Use   Smoking status: Never   Smokeless tobacco: Never  Vaping Use   Vaping Use: Never used  Substance and Sexual Activity   Alcohol use: No   Drug use: No   Sexual activity: Not Currently  Other Topics Concern   Not on file  Social History Narrative   Not on file   Social Determinants of Health   Financial Resource Strain: Low Risk    Difficulty of Paying Living Expenses: Not hard at all  Food Insecurity: No Food Insecurity   Worried About Charity fundraiser in the Last Year: Never true   [place] in the Last Year: Never true  Transportation Needs: No Transportation Needs   Lack of Transportation (Medical): No   Lack of Transportation (Non-Medical): No  Physical Activity: Insufficiently Active   Days of Exercise per Week: 2 days   Minutes of Exercise per Session: 20 min  Stress: No Stress Concern Present   Feeling of Stress : Not at all  Social Connections: Not on file    Tobacco Counseling Counseling given: Not Answered   Clinical Intake:  Pre-visit preparation completed: Yes  Pain : 0-10 Pain Score: 6  Pain Type: Acute pain Pain Location: Head Pain Descriptors / Indicators: Aching Pain Onset: In the past 7 days Pain Frequency: Constant     Nutritional Status: BMI <19  Underweight Nutritional Risks: None Diabetes: No  How often do you need to have someone help  you when you read instructions, pamphlets, or other written materials from your doctor or pharmacy?: 1 - Never What is the last grade level you completed in school?: 12th grade  Diabetic? no  Interpreter Needed?: No  Information entered by :: NAllen LPN   Activities of Daily Living In your present state of health, do you have any difficulty performing the following activities: 12/05/2021 07/31/2021  Hearing? Y N  Vision? N N  Difficulty concentrating or making decisions? N N  Walking or climbing stairs? N N  Dressing or bathing? N N  Doing errands, shopping? N [employer]  and eating ? N N  Using the Toilet? N N  In the past six months, have you accidently leaked urine? N N  Do you have problems with loss of bowel control? N N  Managing your Medications? N N  Managing your Finances? N N  Housekeeping or managing your Housekeeping? N N  Some recent data might be hidden    Patient Care Team: Tysinger, Camelia Eng, PA-C as PCP - General (Family Medicine) Jon Billings, RN as [place] any recent [place] you may have received from other than [facility] providers in the past year (date may be approximate).     Assessment:   This is a routine wellness examination for [name].  Hearing/Vision screen Vision Screening - Comments:: No regular eye exams  Dietary issues and exercise activities discussed: Current Exercise Habits: Home exercise routine, Type of exercise: walking, Time (Minutes): 20, Frequency (Times/Week): 2, Weekly Exercise (Minutes/Week): 40   Goals Addressed             This Visit's Progress    Patient Stated       12/05/2021, stay healthy       Depression Screen PHQ 2/9 Scores 12/05/2021 10/30/2021 07/31/2021 01/29/2021 11/28/2020 08/01/2020 12/19/2019  PHQ - 2 Score 0 0 0 0 0 0 1    Fall Risk Fall Risk  12/05/2021 10/30/2021 07/31/2021 01/29/2021 11/28/2020  Falls in the past year? 0 0 0 0 0  Risk for fall due to :  Medication side effect - - - -  Follow up Falls evaluation completed;Education provided;Falls prevention discussed - - - Falls evaluation completed    FALL RISK PREVENTION PERTAINING TO THE HOME:  Any stairs in or around the home? Yes  If so, are there any without handrails? No  Home free of loose throw rugs in walkways, pet beds, electrical cords, etc? Yes  Adequate lighting in your home to reduce risk of falls? Yes   ASSISTIVE DEVICES UTILIZED TO PREVENT FALLS:  Life alert? No  Use of a cane, walker or w/c? No  Grab bars in the bathroom? Yes  Shower chair or bench in shower? No  Elevated toilet seat or a handicapped toilet? No   TIMED UP AND GO:  Was the test performed? No .      Cognitive Function:     6CIT Screen 12/05/2021  What Year? 0 points  What month? 0 points  What time? 0 points  Count back from 20 0 points  Months in reverse 0 points  Repeat phrase 0 points  Total Score 0    Immunizations Immunization History  Administered Date(s) Administered   Fluad Quad(high Dose 65+) 12/02/2020   H1N1 12/25/2008   Influenza Split 11/12/2015, 11/07/2016   Influenza Whole 09/27/2009   Influenza,inj,Quad PF,6+ Mos 10/30/2017, 10/29/2018, 10/27/2019   PFIZER(Purple Top)SARS-COV-2 Vaccination 04/05/2020, 05/03/2020   Pneumococcal Polysaccharide-23 12/04/2011    TDAP status: Due, Education has been provided regarding the importance of this vaccine. Advised may receive this vaccine at local pharmacy or Health Dept. Aware to provide a copy of the vaccination record if obtained from local pharmacy or Health Dept. Verbalized acceptance and understanding.  Flu Vaccine status: Up to date  Pneumococcal vaccine status: Due, Education has been provided regarding the importance of this vaccine. Advised may receive this vaccine at local pharmacy or Health Dept. Aware to provide a copy of the vaccination record if obtained from local pharmacy or Health Dept. Verbalized acceptance  and understanding.  Covid-19 vaccine status: Completed vaccines  Qualifies for Shingles Vaccine? Yes   Zostavax completed No   Shingrix Completed?: No.    Education has been provided regarding the importance of this vaccine. Patient has been advised to call insurance company to determine out of pocket expense if they have not yet received this vaccine. Advised may also receive vaccine at local pharmacy or Health Dept. Verbalized acceptance and understanding.  Screening Tests Health Maintenance  Topic Date Due   TETANUS/TDAP  Never done   PAP SMEAR-Modifier  Never done   Pneumococcal Vaccine 75-25 Years old (2 - PCV) 12/03/2012   INFLUENZA VACCINE  07/28/2021   COVID-19 Vaccine (3 - Booster for Pfizer series) 12/21/2021 (Originally 06/28/2020)   Zoster Vaccines- Shingrix (1 of 2) 03/05/2022 (Originally 09/15/2007)   MAMMOGRAM  12/05/2022 (Originally 03/03/2020)   COLONOSCOPY (Pts 45-69yrs Insurance coverage will need to be confirmed)  07/14/2026   Hepatitis C Screening  Completed   HIV Screening  Completed   HPV VACCINES  Aged Out    Health Maintenance  Health Maintenance Due  Topic Date Due   TETANUS/TDAP  Never done   PAP SMEAR-Modifier  Never done   Pneumococcal Vaccine 75-28 Years old (2 - PCV) 12/03/2012   INFLUENZA VACCINE  07/28/2021    Colorectal cancer screening: Type of screening: Colonoscopy. Completed 07/14/2016. Repeat every 10 years  Mammogram status: decline  Bone Density status: n/a  Lung Cancer Screening: (Low Dose CT Chest recommended if Age 62-80 years, 30 pack-year currently smoking OR have quit w/in 15years.) does not qualify.   Lung Cancer Screening Referral: no  Additional Screening:  Hepatitis C Screening: does qualify; Completed 11/28/2020  Vision Screening: Recommended annual ophthalmology exams for early detection of glaucoma and other disorders of the eye. Is the patient up to date with their annual eye exam?  No  Who is the provider or what is  the name of the office in which the patient attends annual eye exams? none If pt is not established with a provider, would they like to be referred to a provider to establish care? No .   Dental Screening: Recommended annual dental exams for proper oral hygiene  Community Resource Referral / Chronic Care Management: CRR required this visit?  No   CCM required this visit?  No      Plan:     I have personally reviewed and noted the following in the patient's chart:   Medical and social history Use of alcohol, tobacco or illicit drugs  Current medications and supplements including opioid prescriptions.  Functional ability and status Nutritional status Physical activity Advanced directives List of other physicians Hospitalizations, surgeries, and ER visits in previous 12 months Vitals Screenings to include cognitive, depression, and falls Referrals and appointments  In addition, I have reviewed and discussed with patient certain preventive protocols, quality metrics, and best practice recommendations. A written personalized care plan for preventive services as well as general preventive health recommendations were provided to patient.     Barb Merino, LPN   21/12/9415   Nurse Notes: none

## 2021-12-05 NOTE — Patient Instructions (Signed)
[name] , Thank you for taking time to come for your Medicare Wellness Visit. I appreciate your ongoing commitment to your health goals. Please review the following plan we discussed and let me know if I can assist you in the future.   Screening recommendations/referrals: Colonoscopy: completed 07/14/2016 Mammogram: decline Bone Density: n/a Recommended yearly ophthalmology/optometry visit for glaucoma screening and checkup Recommended yearly dental visit for hygiene and checkup  Vaccinations: Influenza vaccine: completed per patient Pneumococcal vaccine: due Tdap vaccine: due Shingles vaccine: discussed  Covid-19:  05/03/2020, 04/05/2020  Advanced directives: Advance directive discussed with you today.   Conditions/risks identified: none  Next appointment: Follow up in one year for your annual wellness visit.   Preventive Care 40-64 Years, Female Preventive care refers to lifestyle choices and visits with your health care provider that can promote health and wellness. What does preventive care include? A yearly physical exam. This is also called an annual well check. Dental exams once or twice a year. Routine eye exams. Ask your health care provider how often you should have your eyes checked. Personal lifestyle choices, including: Daily care of your teeth and gums. Regular physical activity. Eating a healthy diet. Avoiding tobacco and drug use. Limiting alcohol use. Practicing safe sex. Taking low-dose aspirin daily starting at age 78. Taking vitamin and mineral supplements as recommended by your health care provider. What happens during an annual well check? The services and screenings done by your health care provider during your annual well check will depend on your age, overall health, lifestyle risk factors, and family history of disease. Counseling  Your health care provider may ask you questions about your: Alcohol use. Tobacco use. Drug use. Emotional  well-being. Home and relationship well-being. Sexual activity. Eating habits. Work and work Statistician. Method of birth control. Menstrual cycle. Pregnancy history. Screening  You may have the following tests or measurements: Height, weight, and BMI. Blood pressure. Lipid and cholesterol levels. These may be checked every 5 years, or more frequently if you are over 26 years old. Skin check. Lung cancer screening. You may have this screening every year starting at age 38 if you have a 30-pack-year history of smoking and currently smoke or have quit within the past 15 years. Fecal occult blood test (FOBT) of the stool. You may have this test every year starting at age 32. Flexible sigmoidoscopy or colonoscopy. You may have a sigmoidoscopy every 5 years or a colonoscopy every 10 years starting at age 21. Hepatitis C blood test. Hepatitis B blood test. Sexually transmitted disease (STD) testing. Diabetes screening. This is done by checking your blood sugar (glucose) after you have not eaten for a while (fasting). You may have this done every 1-3 years. Mammogram. This may be done every 1-2 years. Talk to your health care provider about when you should start having regular mammograms. This may depend on whether you have a family history of breast cancer. BRCA-related cancer screening. This may be done if you have a family history of breast, ovarian, tubal, or peritoneal cancers. Pelvic exam and Pap test. This may be done every 3 years starting at age 4. Starting at age 2, this may be done every 5 years if you have a Pap test in combination with an HPV test. Bone density scan. This is done to screen for osteoporosis. You may have this scan if you are at high risk for osteoporosis. Discuss your test results, treatment options, and if necessary, the need for more tests with your  health care provider. Vaccines  Your health care provider may recommend certain vaccines, such as: Influenza  vaccine. This is recommended every year. Tetanus, diphtheria, and acellular pertussis (Tdap, Td) vaccine. You may need a Td booster every 10 years. Zoster vaccine. You may need this after age 21. Pneumococcal 13-valent conjugate (PCV13) vaccine. You may need this if you have certain conditions and were not previously vaccinated. Pneumococcal polysaccharide (PPSV23) vaccine. You may need one or two doses if you smoke cigarettes or if you have certain conditions. Talk to your health care provider about which screenings and vaccines you need and how often you need them. This information is not intended to replace advice given to you by your health care provider. Make sure you discuss any questions you have with your health care provider. Document Released: 01/10/2016 Document Revised: 09/02/2016 Document Reviewed: 10/15/2015 Elsevier Interactive Patient Education  2017 [place] Prevention in the Home Falls can cause injuries. They can happen to people of all ages. There are many things you can do to make your home safe and to help prevent falls. What can I do on the outside of my home? Regularly fix the edges of walkways and driveways and fix any cracks. Remove anything that might make you trip as you walk through a door, such as a raised step or threshold. Trim any bushes or trees on the path to your home. Use bright outdoor lighting. Clear any walking paths of anything that might make someone trip, such as rocks or tools. Regularly check to see if handrails are loose or broken. Make sure that both sides of any steps have handrails. Any raised decks and porches should have guardrails on the edges. Have any leaves, snow, or ice cleared regularly. Use sand or salt on walking paths during winter. Clean up any spills in your garage right away. This includes oil or grease spills. What can I do in the bathroom? Use night lights. Install grab bars by the toilet and in the tub and  shower. Do not use towel bars as grab bars. Use non-skid mats or decals in the tub or shower. If you need to sit down in the shower, use a plastic, non-slip stool. Keep the floor dry. Clean up any water that spills on the floor as soon as it happens. Remove soap buildup in the tub or shower regularly. Attach bath mats securely with double-sided non-slip rug tape. Do not have throw rugs and other things on the floor that can make you trip. What can I do in the bedroom? Use night lights. Make sure that you have a light by your bed that is easy to reach. Do not use any sheets or blankets that are too big for your bed. They should not hang down onto the floor. Have a firm chair that has side arms. You can use this for support while you get dressed. Do not have throw rugs and other things on the floor that can make you trip. What can I do in the kitchen? Clean up any spills right away. Avoid walking on wet floors. Keep items that you use a lot in easy-to-reach places. If you need to reach something above you, use a strong step stool that has a grab bar. Keep electrical cords out of the way. Do not use floor polish or wax that makes floors slippery. If you must use wax, use non-skid floor wax. Do not have throw rugs and other things on the floor that  can make you trip. What can I do with my stairs? Do not leave any items on the stairs. Make sure that there are handrails on both sides of the stairs and use them. Fix handrails that are broken or loose. Make sure that handrails are as long as the stairways. Check any carpeting to make sure that it is firmly attached to the stairs. Fix any carpet that is loose or worn. Avoid having throw rugs at the top or bottom of the stairs. If you do have throw rugs, attach them to the floor with carpet tape. Make sure that you have a light switch at the top of the stairs and the bottom of the stairs. If you do not have them, ask someone to add them for  you. What else can I do to help prevent falls? Wear shoes that: Do not have high heels. Have rubber bottoms. Are comfortable and fit you well. Are closed at the toe. Do not wear sandals. If you use a stepladder: Make sure that it is fully opened. Do not climb a closed stepladder. Make sure that both sides of the stepladder are locked into place. Ask someone to hold it for you, if possible. Clearly mark and make sure that you can see: Any grab bars or handrails. First and last steps. Where the edge of each step is. Use tools that help you move around (mobility aids) if they are needed. These include: Canes. Walkers. Scooters. Crutches. Turn on the lights when you go into a dark area. Replace any light bulbs as soon as they burn out. Set up your furniture so you have a clear path. Avoid moving your furniture around. If any of your floors are uneven, fix them. If there are any pets around you, be aware of where they are. Review your medicines with your doctor. Some medicines can make you feel dizzy. This can increase your chance of falling. Ask your doctor what other things that you can do to help prevent falls. This information is not intended to replace advice given to you by your health care provider. Make sure you discuss any questions you have with your health care provider. Document Released: 10/10/2009 Document Revised: 05/21/2016 Document Reviewed: 01/18/2015 Elsevier Interactive Patient Education  2017 [employer].

## 2021-12-11 ENCOUNTER — Telehealth: Payer: Medicare PPO | Admitting: Medical

## 2021-12-11 ENCOUNTER — Other Ambulatory Visit: Payer: Self-pay

## 2021-12-11 ENCOUNTER — Encounter: Payer: Self-pay | Admitting: Medical

## 2021-12-11 VITALS — BP 110/65 | Wt 113.0 lb

## 2021-12-11 DIAGNOSIS — J329 Chronic sinusitis, unspecified: Secondary | ICD-10-CM

## 2021-12-11 DIAGNOSIS — J4541 Moderate persistent asthma with (acute) exacerbation: Secondary | ICD-10-CM | POA: Diagnosis not present

## 2021-12-11 DIAGNOSIS — J40 Bronchitis, not specified as acute or chronic: Secondary | ICD-10-CM | POA: Insufficient documentation

## 2021-12-11 MED ORDER — PREDNISONE 10 MG PO TABS: ORAL_TABLET | ORAL | 0 refills | Status: DC

## 2021-12-11 MED ORDER — CLARITHROMYCIN 500 MG PO TABS: 500.0000 mg | ORAL_TABLET | Freq: Two times a day (BID) | ORAL | 0 refills | Status: DC

## 2021-12-11 NOTE — Progress Notes (Signed)
Subjective:     Patient ID: [name], female   DOB: Mar 02, 1956, 65 y.o.   MRN: [id number]  This visit type was conducted due to national recommendations for restrictions regarding the COVID-19 Pandemic (e.g. social distancing) in an effort to limit this patient's exposure and mitigate transmission in our community.  Due to their co-morbid illnesses, this patient is at least at moderate risk for complications without adequate follow up.  This format is felt to be most appropriate for this patient at this time.    Documentation for virtual audio and video telecommunications through [employer] encounter:  The patient was located at home. The provider was located in the office. The patient did consent to this visit and is aware of possible charges through their insurance for this visit.  The other persons participating in this telemedicine service were none. Time spent on call was 20 minutes and in review of previous records 20 minutes total.  This virtual service is not related to other E/M service within previous 7 days.   HPI Chief Complaint  Patient presents with   Bronchitis    Bronchitis flare up and tightness in chest when coughing   Virtual consult for illness.  Started about a week ago.  Started with congestion in sinuses, burning in sinuses, and has worsened to chest congestion, rattly chest, sob, using inhaler 4 times daily.  Subjective fever some.  Ears feel tight. No sore throat.  No NVD.  Gets yellow nasal discharge first in the morning.  Coughing a lot.  No sick contacts.   Using just albuterol for symptoms.  No recent covid test. Still on Breo daily.  No other aggravating or relieving factors. No other complaint.   Past Medical History:  Diagnosis Date   Allergic rhinitis    Asthma    Atherosclerosis    Chronic kidney disease (CKD), stage III (moderate) (HCC)    Esophageal stricture    GERD (gastroesophageal reflux disease)    History of anemia due to chronic  kidney disease    History of colitis 2017   Hypertension    Metabolic bone disease    Raynaud disease    Scleroderma (HCC)    Current Outpatient Medications on File Prior to Visit  Medication Sig Dispense Refill   acetaminophen (TYLENOL) 325 MG tablet Take 325 mg by mouth every 6 (six) hours as needed for mild pain.     albuterol (PROAIR HFA) 108 (90 Base) MCG/ACT inhaler Inhale 2 puffs into the lungs every 6 (six) hours as needed for wheezing. 18 g 1   albuterol (PROVENTIL) (2.5 MG/3ML) 0.083% nebulizer solution Take 3 mLs (2.5 mg total) by nebulization every 6 (six) hours as needed for wheezing or shortness of breath. 75 mL 3   amLODipine (NORVASC) 5 MG tablet Take 5 mg by mouth daily.      BREO ELLIPTA 200-25 MCG/INH AEPB Inhale 1 puff by mouth once daily 60 each 11   fluticasone (FLONASE) 50 MCG/ACT nasal spray Place 1 spray into both nostrils daily.     lisinopril (ZESTRIL) 5 MG tablet Take 5 mg by mouth daily.     omeprazole (PRILOSEC) 20 MG capsule Take 20 mg by mouth daily.     pseudoephedrine (SUDAFED) 30 MG tablet Take 30 mg by mouth every 6 (six) hours as needed for congestion.     [DISCONTINUED] famotidine (PEPCID) 20 MG tablet Take 20 mg by mouth 2 (two) times daily.       No current facility-administered  medications on file prior to visit.     Review of Systems As in subjective    Objective:   Physical Exam Due to coronavirus pandemic stay at home measures, patient visit was virtual and they were not examined in person.   BP 110/65    Wt 113 lb (51.3 kg)    BMI 18.80 kg/m   Gen: wd, wn, nad No wheezing or labored breathing     Assessment:     Encounter Diagnoses  Name Primary?   Sinobronchitis Yes   Moderate persistent asthma with exacerbation        Plan:     Advised rest, hydration, mucinex DM, continue Breo and albuterol, begin medications below.  If worse or not much improved in the net 72 hours, then call or recheck  She will call back in 3 weeks  to remind [place] to send Breo to [facility] mail order pharmacy as this will be less expensive for her.    Anber was seen today for bronchitis.  Diagnoses and all orders for this visit:  Sinobronchitis  Moderate persistent asthma with exacerbation  Other orders -     clarithromycin (BIAXIN) 500 MG tablet; Take 1 tablet (500 mg total) by mouth 2 (two) times daily. -     predniSONE (DELTASONE) 10 MG tablet; 6 tablets day 1, 5 tablets day 2, 4 tablets day 3, 3 tablets day 4, 2 tablets day 5, 1 tablet day 6  F/u prn

## 2021-12-24 ENCOUNTER — Telehealth: Payer: Self-pay | Admitting: Medical

## 2021-12-24 ENCOUNTER — Other Ambulatory Visit: Payer: Self-pay | Admitting: Medical

## 2021-12-24 MED ORDER — FLUTICASONE FUROATE-VILANTEROL 200-25 MCG/ACT IN AEPB: 1.0000 | INHALATION_SPRAY | Freq: Every day | RESPIRATORY_TRACT | 1 refills | Status: DC

## 2021-12-24 NOTE — Telephone Encounter (Signed)
Pt called and states that she needs [facility] for a 3 month supply she can get it for a low price, since she is getting ready to go in the donut hole, states you and her talked about it,

## 2022-01-07 ENCOUNTER — Other Ambulatory Visit: Payer: Self-pay

## 2022-01-07 ENCOUNTER — Encounter: Payer: Self-pay | Admitting: Primary Care

## 2022-01-07 ENCOUNTER — Ambulatory Visit (INDEPENDENT_AMBULATORY_CARE_PROVIDER_SITE_OTHER): Payer: Medicare PPO

## 2022-01-07 ENCOUNTER — Ambulatory Visit: Payer: Medicare PPO | Admitting: Primary Care

## 2022-01-07 VITALS — BP 114/62 | HR 66 | Temp 98.1°F | Ht 64.0 in | Wt 113.2 lb

## 2022-01-07 DIAGNOSIS — J209 Acute bronchitis, unspecified: Secondary | ICD-10-CM

## 2022-01-07 DIAGNOSIS — J849 Interstitial pulmonary disease, unspecified: Secondary | ICD-10-CM | POA: Diagnosis not present

## 2022-01-07 DIAGNOSIS — J189 Pneumonia, unspecified organism: Secondary | ICD-10-CM

## 2022-01-07 DIAGNOSIS — J4541 Moderate persistent asthma with (acute) exacerbation: Secondary | ICD-10-CM | POA: Diagnosis not present

## 2022-01-07 MED ORDER — PREDNISONE 10 MG PO TABS: ORAL_TABLET | ORAL | 0 refills | Status: DC

## 2022-01-07 NOTE — Progress Notes (Signed)
@Patient  ID: [name], female    DOB: [birth year], 66 y.o.   MRN: [id number]  Chief Complaint  Patient presents with   Follow-up    Patient says cough has been going on since december     Referring provider: Caryl Ada  HPI: 66 year old female, never smoked. PMH significant for asthma, recurrent pneumonia, sinobronchitis. Patient of Dr. Lamonte Sakai.   01/07/2022 Patient presents today for acute OV. She had televisit with PCP in December for URI symptoms, she was treated with clarithromycin x 10 days. Her breathing improved but she still has a persistent cough and nasal congestion. She tried mucinex but this caused diarrhea. She has had head and chest congestion x 4 weeks. Cough is productive with puruelnt mucus. She has some associated wheezing. She is consistently using BREO 21mcg as prescribed. She denies f/c/s, shortness of breath.   Allergies  Allergen Reactions   Avelox [Moxifloxacin Hcl In Nacl]     Caused tachycardia   Codeine Hives and Other (See Comments)    Hallucination,    Penicillins Hives    No anaphylaxis/SJS symptoms, > 10 yr ago, no hospitalization required States she has tolerated amoxicillin     Immunization History  Administered Date(s) Administered   Fluad Quad(high Dose 65+) 12/02/2020   H1N1 12/25/2008   Influenza Split 11/12/2015, 11/07/2016   Influenza Whole 09/27/2009   Influenza,inj,Quad PF,6+ Mos 10/30/2017, 10/29/2018, 10/27/2019   PFIZER(Purple Top)SARS-COV-2 Vaccination 04/05/2020, 05/03/2020   Pneumococcal Polysaccharide-23 12/04/2011    Past Medical History:  Diagnosis Date   Allergic rhinitis    Asthma    Atherosclerosis    Chronic kidney disease (CKD), stage III (moderate) (HCC)    Esophageal stricture    GERD (gastroesophageal reflux disease)    History of anemia due to chronic kidney disease    History of colitis 2017   Hypertension    Metabolic bone disease    Raynaud disease    Scleroderma ([place])     Tobacco  History: Social History   Tobacco Use  Smoking Status Never  Smokeless Tobacco Never   Counseling given: Not Answered   Outpatient Medications Prior to Visit  Medication Sig Dispense Refill   acetaminophen (TYLENOL) 325 MG tablet Take 325 mg by mouth every 6 (six) hours as needed for mild pain.     albuterol (PROAIR HFA) 108 (90 Base) MCG/ACT inhaler Inhale 2 puffs into the lungs every 6 (six) hours as needed for wheezing. 18 g 1   albuterol (PROVENTIL) (2.5 MG/3ML) 0.083% nebulizer solution Take 3 mLs (2.5 mg total) by nebulization every 6 (six) hours as needed for wheezing or shortness of breath. 75 mL 3   amLODipine (NORVASC) 5 MG tablet Take 5 mg by mouth daily.      BREO ELLIPTA 200-25 MCG/INH AEPB Inhale 1 puff by mouth once daily 60 each 11   fluticasone (FLONASE) 50 MCG/ACT nasal spray Place 1 spray into both nostrils daily.     fluticasone furoate-vilanterol (BREO ELLIPTA) 200-25 MCG/ACT AEPB Inhale 1 puff into the lungs daily. 3 each 1   lisinopril (ZESTRIL) 5 MG tablet Take 5 mg by mouth daily.     omeprazole (PRILOSEC) 20 MG capsule Take 20 mg by mouth daily.     pseudoephedrine (SUDAFED) 30 MG tablet Take 30 mg by mouth every 6 (six) hours as needed for congestion.     clarithromycin (BIAXIN) 500 MG tablet Take 1 tablet (500 mg total) by mouth 2 (two) times daily. (Patient  not taking: Reported on 01/07/2022) 20 tablet 0   predniSONE (DELTASONE) 10 MG tablet 6 tablets day 1, 5 tablets day 2, 4 tablets day 3, 3 tablets day 4, 2 tablets day 5, 1 tablet day 6 (Patient not taking: Reported on 01/07/2022) 21 tablet 0   No facility-administered medications prior to visit.      Review of Systems  Review of Systems  Constitutional: Negative.  Negative for fatigue and fever.  HENT:  Positive for congestion.   Respiratory:  Positive for cough and wheezing. Negative for chest tightness and shortness of breath.   Cardiovascular: Negative.     Physical Exam  BP 114/62 (BP  Location: Right Arm, Patient Position: Sitting, Cuff Size: Normal)    Pulse 66    Temp 98.1 F (36.7 C) (Oral)    Ht 5\' 4"  (1.626 m)    Wt 113 lb 3.2 oz (51.3 kg)    SpO2 98%    BMI 19.43 kg/m  Physical Exam Constitutional:      Appearance: Normal appearance.  HENT:     Head: Normocephalic and atraumatic.     Mouth/Throat:     Mouth: Mucous membranes are moist.     Pharynx: Oropharynx is clear.  Cardiovascular:     Rate and Rhythm: Normal rate and regular rhythm.  Pulmonary:     Effort: Pulmonary effort is normal.     Breath sounds: Wheezing and rhonchi present.     Comments: Insp wheezing and scattered rhonchi Musculoskeletal:     Cervical back: Normal range of motion and neck supple.  Neurological:     General: No focal deficit present.     Mental Status: She is alert and oriented to person, place, and time. Mental status is at baseline.  Psychiatric:        Mood and Affect: Mood normal.        Behavior: Behavior normal.        Thought Content: Thought content normal.        Judgment: Judgment normal.     Lab Results:  CBC    Component Value Date/Time   WBC 8.3 12/02/2020 1428   RBC 3.85 (L) 12/02/2020 1428   HGB 12.1 12/02/2020 1428   HGB 12.3 11/28/2020 1129   HCT 36.8 12/02/2020 1428   HCT 38.1 11/28/2020 1129   PLT 237.0 12/02/2020 1428   PLT 228 11/28/2020 1129   MCV 95.5 12/02/2020 1428   MCV 96 11/28/2020 1129   MCH 31.1 11/28/2020 1129   MCH 27.9 12/08/2019 0414   MCHC 32.9 12/02/2020 1428   RDW 12.6 12/02/2020 1428   RDW 11.5 (L) 11/28/2020 1129   LYMPHSABS 1.6 12/02/2020 1428   LYMPHSABS 1.7 11/28/2020 1129   MONOABS 0.6 12/02/2020 1428   EOSABS 0.9 (H) 12/02/2020 1428   EOSABS 0.6 (H) 11/28/2020 1129   BASOSABS 0.1 12/02/2020 1428   BASOSABS 0.1 11/28/2020 1129    BMET    Component Value Date/Time   NA 136 11/28/2020 1129   K 4.8 11/28/2020 1129   CL 97 11/28/2020 1129   CO2 24 11/28/2020 1129   GLUCOSE 83 11/28/2020 1129   GLUCOSE 90  12/08/2019 0414   BUN 21 11/28/2020 1129   CREATININE 1.38 (H) 11/28/2020 1129   CALCIUM 9.4 11/28/2020 1129   GFRNONAA 41 (L) 11/28/2020 1129   GFRAA 47 (L) 11/28/2020 1129    BNP    Component Value Date/Time   BNP 137.0 (H) 12/01/2019 1841    ProBNP  Component Value Date/Time   PROBNP >3200.0 (H) 02/04/2010 0540    Imaging: DG Chest 2 View  Result Date: 01/07/2022 CLINICAL DATA:  Acute bronchitis EXAM: CHEST - 2 VIEW COMPARISON:  Chest x-ray dated December 02, 2020 FINDINGS: Cardiac and mediastinal contours are unchanged. Biapical pleuroparenchymal scarring. Bilateral reticular opacities are unchanged when compared prior exam and likely due to underlying chronic lung disease. No new focal consolidation. No evidence of pleural effusion or pneumothorax. IMPRESSION: No acute cardiopulmonary disease. Electronically Signed   By: Yetta Glassman [name]   On: 01/07/2022 16:51     Assessment & Plan:   Moderate persistent asthma with exacerbation Acute asthma exacerbation secondary to bronchitis. She has had a productive cough x 4 weeks. Her symptoms tend to flare in Spring/Fall. Treated by PCP in December with Clarithromycin without resolution of bronchitis symptoms. Continues to have persistent cough with some purulent mucus. CXR today showed no acute process; Biapical pleuroparenchymal scarring. Bilateral reticular opacities are unchanged when compared prior exam and likely due to underlying chronic lung disease. Sending in RX doxycyline 100mg  BID x 7 days and prednisone taper. Recommend she take OTC Robitussin every 4 hours as needed for cough and use flutter 2-3 times a day as needed for chest congestion. Continue Breo 223mcg one puff daily, albuterol inhaler every 6 hours for shortness of breath/wheezing. Needs PFTs on follow-up in 2-3 months with Dr. Lamonte Sakai. May want to check HRCT if PFTs/ diffusion capacity are abnormal.    Martyn Ehrich, NP 01/13/2022

## 2022-01-07 NOTE — Patient Instructions (Addendum)
Recommendations: Take OTC Robitussin every 4 hours as needed for cough  Use flutter 2-3 times a day as needed for chest congestion  Continue Breo one puff daily Use albuterol inhaler every 6 hours for shortness of breath/wheezing  Orders: CXR re: bronchitis  PFTs (ordered)  Rx: Prednisone taper as directed   Follow-up: 2-3 months with Dr. Delton Coombes PFTs 1 hour prior

## 2022-01-08 ENCOUNTER — Telehealth: Payer: Self-pay | Admitting: Primary Care

## 2022-01-08 NOTE — Telephone Encounter (Signed)
Spoke with the pt. She was under the impression that we would be sending in abx for her. She said Waynetta Sandy was going to prescribe this based on her cxr results. Please advise thanks!

## 2022-01-09 MED ORDER — DOXYCYCLINE HYCLATE 100 MG PO TABS: 100.0000 mg | ORAL_TABLET | Freq: Two times a day (BID) | ORAL | 0 refills | Status: DC

## 2022-01-09 NOTE — Telephone Encounter (Signed)
Attempted to call pt but unable to reach. Left message for her to return call. 

## 2022-01-09 NOTE — Telephone Encounter (Signed)
Patient's daughter, Megan(DPR) is aware of below message/recommendaitons and voiced her understanding.  Nothing further needed at this time.

## 2022-01-09 NOTE — Telephone Encounter (Signed)
Attempted to call pt's daughter Aundra Millet but unable to reach. Left message for her to return call.

## 2022-01-09 NOTE — Telephone Encounter (Signed)
I was waiting for CXR result which were normal. I will send in abx since she is having persistent symptoms

## 2022-01-09 NOTE — Telephone Encounter (Signed)
Pt aware abx sent and cxr normal

## 2022-01-13 NOTE — Assessment & Plan Note (Addendum)
Acute asthma exacerbation secondary to bronchitis. She has had a productive cough x 4 weeks. Her symptoms tend to flare in Spring/Fall. Treated by PCP in December with Clarithromycin without resolution of bronchitis symptoms. Continues to have persistent cough with some purulent mucus. CXR today showed no acute process; Biapical pleuroparenchymal scarring. Bilateral reticular opacities are unchanged when compared prior exam and likely due to underlying chronic lung disease. Sending in RX doxycyline 100mg  BID x 7 days and prednisone taper. Recommend she take OTC Robitussin every 4 hours as needed for cough and use flutter 2-3 times a day as needed for chest congestion. Continue Breo 269mcg one puff daily, albuterol inhaler every 6 hours for shortness of breath/wheezing. Needs PFTs on follow-up in 2-3 months with Dr. Lamonte Sakai. May want to check HRCT if PFTs/ diffusion capacity are abnormal.

## 2022-02-02 ENCOUNTER — Other Ambulatory Visit: Payer: Self-pay

## 2022-02-02 NOTE — Patient Outreach (Signed)
[facility]) Care Management  02/02/2022  [name] [birth year] [id number]  CMA made telephone outreach call to patient regarding upcoming appointment with Care Coordinator Fleeta Emmer, RN. Patient aware RN is out of the office and will reschedule appointment once she returns.  Baruch Gouty [facility] Management Assistant [phone]

## 2022-02-03 ENCOUNTER — Ambulatory Visit: Payer: Self-pay

## 2022-02-12 ENCOUNTER — Other Ambulatory Visit: Payer: Self-pay

## 2022-02-12 ENCOUNTER — Ambulatory Visit: Payer: Medicare PPO | Admitting: Medical

## 2022-02-12 VITALS — BP 120/64 | HR 74 | Temp 97.9°F | Wt 113.2 lb

## 2022-02-12 DIAGNOSIS — M436 Torticollis: Secondary | ICD-10-CM

## 2022-02-12 DIAGNOSIS — L03115 Cellulitis of right lower limb: Secondary | ICD-10-CM

## 2022-02-12 DIAGNOSIS — M898X9 Other specified disorders of bone, unspecified site: Secondary | ICD-10-CM | POA: Diagnosis not present

## 2022-02-12 DIAGNOSIS — I129 Hypertensive chronic kidney disease with stage 1 through stage 4 chronic kidney disease, or unspecified chronic kidney disease: Secondary | ICD-10-CM

## 2022-02-12 DIAGNOSIS — M545 Low back pain, unspecified: Secondary | ICD-10-CM | POA: Diagnosis not present

## 2022-02-12 DIAGNOSIS — M349 Systemic sclerosis, unspecified: Secondary | ICD-10-CM | POA: Diagnosis not present

## 2022-02-12 DIAGNOSIS — H04203 Unspecified epiphora, bilateral lacrimal glands: Secondary | ICD-10-CM

## 2022-02-12 MED ORDER — TIZANIDINE HCL 4 MG PO TABS: 2.0000 mg | ORAL_TABLET | Freq: Every evening | ORAL | 0 refills | Status: DC | PRN

## 2022-02-12 MED ORDER — DOXYCYCLINE HYCLATE 100 MG PO TABS: 100.0000 mg | ORAL_TABLET | Freq: Two times a day (BID) | ORAL | 0 refills | Status: DC

## 2022-02-12 NOTE — Progress Notes (Signed)
Subjective: Chief Complaint  Patient presents with   celluitis     Celluitis in right leg, lower area. Crick in neck when turning head   Here for several issues.   Her main concern is cellulitis in the right lower leg.  She had cellulitis of her lower leg/ankle back in 2003.  Her current symptoms started several days ago with swelling and redness and warmth however it is mostly resolved at this point in recent days.  No fever no body aches or chills.  No recent injury no recent skin wound.  She notes a crick in her neck that started about a week ago.  When it started she could not move her neck to the right and had to turn her whole body to turn to the side.  This has mostly improved as well.  She had a similar episode back in late January.  She aggravated her low back recently trying to wash the dog and tub.  She spent a lot of time cleaning the tub after washing the dog and thinks that aggravated her back and may have aggravated her neck issues as well.  No arm numbness tingling or weakness.  No swelling or bruising.  She has concerns about her eyes.  Her eyes have been watery lately but not itchy.  She had pinkeye back around 1 January but that got better.  Had red eyes and goopy and pus at that time.  Currently she just has watery eyes particular when she looks up at the light or tries to read.  No vision change.  Past Medical History:  Diagnosis Date   Allergic rhinitis    Asthma    Atherosclerosis    Chronic kidney disease (CKD), stage III (moderate) (HCC)    Esophageal stricture    GERD (gastroesophageal reflux disease)    History of anemia due to chronic kidney disease    History of colitis 2017   Hypertension    Metabolic bone disease    Raynaud disease    Scleroderma ([place])    Current Outpatient Medications on File Prior to Visit  Medication Sig Dispense Refill   acetaminophen (TYLENOL) 325 MG tablet Take 325 mg by mouth every 6 (six) hours as needed for mild pain.      albuterol (PROAIR HFA) 108 (90 Base) MCG/ACT inhaler Inhale 2 puffs into the lungs every 6 (six) hours as needed for wheezing. 18 g 1   albuterol (PROVENTIL) (2.5 MG/3ML) 0.083% nebulizer solution Take 3 mLs (2.5 mg total) by nebulization every 6 (six) hours as needed for wheezing or shortness of breath. 75 mL 3   amLODipine (NORVASC) 5 MG tablet Take 5 mg by mouth daily.      fluticasone (FLONASE) 50 MCG/ACT nasal spray Place 1 spray into both nostrils daily.     fluticasone furoate-vilanterol (BREO ELLIPTA) 200-25 MCG/ACT AEPB Inhale 1 puff into the lungs daily. 3 each 1   lisinopril (ZESTRIL) 5 MG tablet Take 5 mg by mouth daily.     omeprazole (PRILOSEC) 20 MG capsule Take 20 mg by mouth daily.     pseudoephedrine (SUDAFED) 30 MG tablet Take 30 mg by mouth every 6 (six) hours as needed for congestion.     [DISCONTINUED] famotidine (PEPCID) 20 MG tablet Take 20 mg by mouth 2 (two) times daily.       No current facility-administered medications on file prior to visit.   ROS as in subjective     Objective: BP 120/64  Pulse 74    Temp 97.9 F (36.6 C)    Wt 113 lb 3.2 oz (51.3 kg)    BMI 19.43 kg/m   Gen: wd, wn, nad Both Eyes actually look a little dry and the mucosa, no redness no pus no matted eyelids, no surrounding erythema otherwise, PERRLA, EOMI.  Otherwise HEENT unremarkable Neck nontender, slightly decreased range of motion with right rotation otherwise range of motion normal, no mass no lymphadenopathy Back nontender with normal range of motion Arms nontender, normal range of motion without deformity but she does note some pain in her right neck and upper back with neck and arm range of motion Arms neurovascularly intact Skin of the hands appears tight consistent with her scleroderma diagnosis Slight tenderness to the anterior right lower leg just proximal to the ankle but no obvious redness no obvious swelling or warmth Leg pulses normal Range of motion of back decreased  today and she seems to be guarded with the right lower back somewhat    Assessment: Encounter Diagnoses  Name Primary?   Cellulitis of right lower extremity Yes   Watery eyes    Right-sided low back pain without sciatica, unspecified chronicity    Scleroderma (HCC)    Metabolic bone disease    Hypertensive nephropathy    Torticollis      Plan Cellulitis - mostly improved or resolving.  If any change for worsening in the next few days, begin Doxycycline.  Otherwise for the next few days continue leg elevation, warm compresses.    Watery eyes-no obvious pinkeye.  She does not have other allergy symptoms.  Questionable related to scleroderma or underlying autoimmune issue.  Advise she see her eye doctor for further evaluation and management.  I worry that allergy eyedrops might make things worse since her eyes actually appear more dry than moist.  We did discuss TheraTears over-the-counter for lubrication  Torticollis-advised stretching, rest, heat, consider massage therapy.  90% improved from what she describes as her initial symptoms.  Can use muscle laxer as needed.  Discussed risk and benefits and proper use of muscle laxer.  Can use once or twice a day max.  Advised if she continues to have problems then recheck and we can consider other evaluation such as imaging and possible referral  Low back pain-she will continue to do the exercises that the chiropractor gave her.  She is I will contact her for this recently.  Given her underlying scleroderma and chronic rheumatoid type issues, if her multiple concerns today continue to be a problem then may need to discuss her overall treatment with rheumatoid conditions in case this is playing a role.  Follow-up with rheumatology  Judean was seen today for celluitis .  Diagnoses and all orders for this visit:  Cellulitis of right lower extremity  Watery eyes  Right-sided low back pain without sciatica, unspecified  chronicity  Scleroderma ([place])  Metabolic bone disease  Hypertensive nephropathy  Torticollis  Other orders -     doxycycline (VIBRA-TABS) 100 MG tablet; Take 1 tablet (100 mg total) by mouth 2 (two) times daily. -     tiZANidine (ZANAFLEX) 4 MG tablet; Take 0.5 tablets (2 mg total) by mouth at bedtime as needed for muscle spasms.    F/u prn

## 2022-03-18 ENCOUNTER — Other Ambulatory Visit: Payer: Self-pay

## 2022-03-18 NOTE — Patient Outreach (Signed)
[facility]) Care Management ? ?03/18/2022 ? ?[name] ?[birth year] ?[id number] ? ? ?Telephone call to patient for disease management follow up.   No answer.  HIPAA compliant voice message left.   ? ?Plan: If no return call, RN CM will attempt patient again in June. ? ?[name], RN, MSN ?[facility] Care Management ?Care Management Coordinator ?Direct Line [phone] ?Toll Free: [phone]  ?Fax: [phone] ? ?

## 2022-03-27 ENCOUNTER — Ambulatory Visit (INDEPENDENT_AMBULATORY_CARE_PROVIDER_SITE_OTHER): Payer: Medicare PPO | Admitting: Medical

## 2022-03-27 VITALS — BP 110/60 | HR 64 | Temp 98.0°F | Wt 113.8 lb

## 2022-03-27 DIAGNOSIS — M898X9 Other specified disorders of bone, unspecified site: Secondary | ICD-10-CM | POA: Diagnosis not present

## 2022-03-27 DIAGNOSIS — E2839 Other primary ovarian failure: Secondary | ICD-10-CM

## 2022-03-27 DIAGNOSIS — Z780 Asymptomatic menopausal state: Secondary | ICD-10-CM

## 2022-03-27 DIAGNOSIS — R7989 Other specified abnormal findings of blood chemistry: Secondary | ICD-10-CM | POA: Diagnosis not present

## 2022-03-27 DIAGNOSIS — M349 Systemic sclerosis, unspecified: Secondary | ICD-10-CM | POA: Diagnosis not present

## 2022-03-27 DIAGNOSIS — Z1231 Encounter for screening mammogram for malignant neoplasm of breast: Secondary | ICD-10-CM | POA: Diagnosis not present

## 2022-03-27 MED ORDER — TIZANIDINE HCL 4 MG PO TABS: 2.0000 mg | ORAL_TABLET | Freq: Every evening | ORAL | 1 refills | Status: DC | PRN

## 2022-03-27 MED ORDER — VITAMIN D3 125 MCG (5000 UT) PO CAPS: 5000.0000 [IU] | ORAL_CAPSULE | Freq: Every day | ORAL | 1 refills | Status: DC

## 2022-03-27 NOTE — Patient Instructions (Signed)
   Please call to schedule your bone density   [facility]  [phone] [address], [address] , [place] [address]   

## 2022-03-27 NOTE — Progress Notes (Signed)
Subjective: ? [name] is a 66 y.o. female who presents for ?Chief Complaint  ?Patient presents with  ? pain around shoulder blades  ?  Pain around shoulder blades since Tuesday but felt it in chest Saturday like it was pulling  ?   ?Medical team: ?Dr. Quay Burow, cardiology ?Dr. Court Joy, pulmonology ?Dr. Owens Loffler, GI ?Dr. Kathlene November, rheumatology ?Dr. Posey Pronto, nephrology ? ? ?Here for pains.  No recent fall or injury.  Pain on left shoulder blade.  Worse if moving left arm.   If moving arm gets shooting pain, othewrise is a dull ache.   No pain down arm radiating, but has hand pain and elbow pain ongoing.   Always has had some numbness and tingling in hands.  Had twinge of pain in chest few days ago, once.   No SOB, no palpations.   No leg swelling.  She notes not having as much pains regularly until 2020. ? ?No other aggravating or relieving factors.   ? ?No other c/o. ? ?Past Medical History:  ?Diagnosis Date  ? Allergic rhinitis   ? Asthma   ? Atherosclerosis   ? Chronic kidney disease (CKD), stage III (moderate) (HCC)   ? Esophageal stricture   ? GERD (gastroesophageal reflux disease)   ? History of anemia due to chronic kidney disease   ? History of colitis 2017  ? Hypertension   ? Metabolic bone disease   ? Raynaud disease   ? Scleroderma ([place])   ? ?Current Outpatient Medications on File Prior to Visit  ?Medication Sig Dispense Refill  ? acetaminophen (TYLENOL) 325 MG tablet Take 325 mg by mouth every 6 (six) hours as needed for mild pain.    ? albuterol (PROAIR HFA) 108 (90 Base) MCG/ACT inhaler Inhale 2 puffs into the lungs every 6 (six) hours as needed for wheezing. 18 g 1  ? albuterol (PROVENTIL) (2.5 MG/3ML) 0.083% nebulizer solution Take 3 mLs (2.5 mg total) by nebulization every 6 (six) hours as needed for wheezing or shortness of breath. 75 mL 3  ? amLODipine (NORVASC) 5 MG tablet Take 5 mg by mouth daily.     ? fluticasone (FLONASE) 50 MCG/ACT nasal spray Place 1 spray into both  nostrils daily.    ? fluticasone furoate-vilanterol (BREO ELLIPTA) 200-25 MCG/ACT AEPB Inhale 1 puff into the lungs daily. 3 each 1  ? lisinopril (ZESTRIL) 5 MG tablet Take 5 mg by mouth daily.    ? omeprazole (PRILOSEC) 20 MG capsule Take 20 mg by mouth daily.    ? pseudoephedrine (SUDAFED) 30 MG tablet Take 30 mg by mouth every 6 (six) hours as needed for congestion.    ? [DISCONTINUED] famotidine (PEPCID) 20 MG tablet Take 20 mg by mouth 2 (two) times daily.      ? ?No current facility-administered medications on file prior to visit.  ? ? ? ?The following portions of the patient's history were reviewed and updated as appropriate: allergies, current medications, past family history, past medical history, past social history, past surgical history and problem list. ? ?ROS ?Otherwise as in subjective above ? ? ? ?Objective: ?BP 110/60   Pulse 64   Temp 98 ?F (36.7 ?C)   Wt 113 lb 12.8 oz (51.6 kg)   BMI 19.53 kg/m?  ? ?General appearance: alert, no distress, well developed, well nourished ?Tender along left rhomboids, otherwise arm and back and neck and chest nontender, no obvious swelling ?Neck nontender, no lymphadenopathy, no mass, normal range of motion ?  Back otherwise nontender with normal range of motion ?Arms and legs neurovascularly intact ?She has tight skin of the hands suggestive of scleroderma.  There are bony arthritic changes throughout both fingers.  No obvious swelling of wrist or hands.  Normal range of motion of fingers and wrist today. ? ? ? ? ?Assessment: ?Encounter Diagnoses  ?Name Primary?  ? Post-menopausal Yes  ? Scleroderma ([place])   ? Metabolic bone disease   ? Estrogen deficiency   ? Abnormal thyroid blood test   ? Encounter for screening mammogram for malignant neoplasm of breast   ? ? ? ?Plan: ?Her back symptoms today suggest musculoskeletal pain and strain.  Advised she consider massage therapy.  We discussed some stretches she can do to help with the pain.  She can use  over-the-counter Tylenol once or twice a day for the next few days.  She can use topical muscle rubs such as IcyHot to the back or heat pad.  Consider going to the [facility] for lap swimming or rowing machine as this would probably help her pain as well ? ?She has obvious swelling of her hands or wrist today.  She does have underlying scleroderma.  Advise she follow-up with rheumatology.  For now can use Aspercreme or topical arthritis cream.  Can use Tylenol as needed. ? ?I advise she schedule her bone density and mammogram.  Orders placed. ? ?[name] was seen today for pain around shoulder blades. ? ?Diagnoses and all orders for this visit: ? ?Post-menopausal ?-     DG Bone Density; Future ? ?Scleroderma ([place]) ?-     DG Bone Density; Future ? ?Metabolic bone disease ?-     DG Bone Density; Future ? ?Estrogen deficiency ?-     DG Bone Density; Future ? ?Abnormal thyroid blood test ?-     DG Bone Density; Future ? ?Encounter for screening mammogram for malignant neoplasm of breast ?-     MM DIGITAL SCREENING BILATERAL; Future ? ?Other orders ?-     tiZANidine (ZANAFLEX) 4 MG tablet; Take 0.5 tablets (2 mg total) by mouth at bedtime as needed for muscle spasms. ?-     Cholecalciferol (VITAMIN D3) 125 MCG (5000 UT) CAPS; Take 1 capsule (5,000 Units total) by mouth daily. ? ? ? ?Follow up: Pending mammogram and bone density test ?

## 2022-04-02 ENCOUNTER — Other Ambulatory Visit: Payer: Self-pay | Admitting: Medical

## 2022-04-02 DIAGNOSIS — R7989 Other specified abnormal findings of blood chemistry: Secondary | ICD-10-CM

## 2022-04-02 DIAGNOSIS — Z780 Asymptomatic menopausal state: Secondary | ICD-10-CM

## 2022-04-02 DIAGNOSIS — E2839 Other primary ovarian failure: Secondary | ICD-10-CM

## 2022-04-02 DIAGNOSIS — M898X9 Other specified disorders of bone, unspecified site: Secondary | ICD-10-CM

## 2022-04-02 DIAGNOSIS — M349 Systemic sclerosis, unspecified: Secondary | ICD-10-CM

## 2022-04-17 DIAGNOSIS — R21 Rash and other nonspecific skin eruption: Secondary | ICD-10-CM | POA: Diagnosis not present

## 2022-04-17 DIAGNOSIS — I351 Nonrheumatic aortic (valve) insufficiency: Secondary | ICD-10-CM | POA: Diagnosis not present

## 2022-04-17 DIAGNOSIS — M349 Systemic sclerosis, unspecified: Secondary | ICD-10-CM | POA: Diagnosis not present

## 2022-04-17 DIAGNOSIS — D649 Anemia, unspecified: Secondary | ICD-10-CM | POA: Diagnosis not present

## 2022-04-17 DIAGNOSIS — M79643 Pain in unspecified hand: Secondary | ICD-10-CM | POA: Diagnosis not present

## 2022-04-17 DIAGNOSIS — N189 Chronic kidney disease, unspecified: Secondary | ICD-10-CM | POA: Diagnosis not present

## 2022-04-17 DIAGNOSIS — I7300 Raynaud's syndrome without gangrene: Secondary | ICD-10-CM | POA: Diagnosis not present

## 2022-04-24 ENCOUNTER — Ambulatory Visit: Payer: Medicare PPO

## 2022-05-14 DIAGNOSIS — N1832 Chronic kidney disease, stage 3b: Secondary | ICD-10-CM | POA: Diagnosis not present

## 2022-05-14 DIAGNOSIS — I129 Hypertensive chronic kidney disease with stage 1 through stage 4 chronic kidney disease, or unspecified chronic kidney disease: Secondary | ICD-10-CM | POA: Diagnosis not present

## 2022-05-15 ENCOUNTER — Encounter: Payer: Self-pay | Admitting: Emergency Medicine

## 2022-05-15 ENCOUNTER — Ambulatory Visit: Payer: Medicare PPO | Admitting: Emergency Medicine

## 2022-05-15 ENCOUNTER — Ambulatory Visit (INDEPENDENT_AMBULATORY_CARE_PROVIDER_SITE_OTHER): Payer: Medicare PPO | Admitting: Emergency Medicine

## 2022-05-15 VITALS — BP 122/70 | HR 68 | Temp 98.0°F | Ht 64.0 in | Wt 112.8 lb

## 2022-05-15 DIAGNOSIS — M349 Systemic sclerosis, unspecified: Secondary | ICD-10-CM

## 2022-05-15 DIAGNOSIS — J849 Interstitial pulmonary disease, unspecified: Secondary | ICD-10-CM

## 2022-05-15 DIAGNOSIS — J45909 Unspecified asthma, uncomplicated: Secondary | ICD-10-CM

## 2022-05-15 DIAGNOSIS — J310 Chronic rhinitis: Secondary | ICD-10-CM

## 2022-05-15 DIAGNOSIS — K219 Gastro-esophageal reflux disease without esophagitis: Secondary | ICD-10-CM

## 2022-05-15 LAB — PULMONARY FUNCTION TEST
DL/VA % pred: 87 %
DL/VA: 3.65 ml/min/mmHg/L
DLCO cor % pred: 52 %
DLCO cor: 10.55 ml/min/mmHg
DLCO unc % pred: 52 %
DLCO unc: 10.55 ml/min/mmHg
FEF 25-75 Post: 2.28 L/sec
FEF 25-75 Pre: 2.13 L/sec
FEF2575-%Change-Post: 7 %
FEF2575-%Pred-Post: 104 %
FEF2575-%Pred-Pre: 97 %
FEV1-%Change-Post: 2 %
FEV1-%Pred-Post: 71 %
FEV1-%Pred-Pre: 70 %
FEV1-Post: 1.76 L
FEV1-Pre: 1.72 L
FEV1FVC-%Change-Post: 3 %
FEV1FVC-%Pred-Pre: 118 %
FEV6-%Change-Post: -1 %
FEV6-%Pred-Post: 60 %
FEV6-%Pred-Pre: 61 %
FEV6-Post: 1.86 L
FEV6-Pre: 1.88 L
FEV6FVC-%Pred-Post: 104 %
FEV6FVC-%Pred-Pre: 104 %
FVC-%Change-Post: -1 %
FVC-%Pred-Post: 58 %
FVC-%Pred-Pre: 58 %
FVC-Post: 1.86 L
FVC-Pre: 1.88 L
Post FEV1/FVC ratio: 95 %
Post FEV6/FVC ratio: 100 %
Pre FEV1/FVC ratio: 91 %
Pre FEV6/FVC Ratio: 100 %
RV % pred: 112 %
RV: 2.32 L
TLC % pred: 77 %
TLC: 3.9 L

## 2022-05-15 NOTE — Patient Instructions (Signed)
Attempted Full PFT Today. Performed Pre/Post Spirometry and Pleth.  

## 2022-05-15 NOTE — Assessment & Plan Note (Signed)
Continue same GERD therapy as ordered

## 2022-05-15 NOTE — Assessment & Plan Note (Signed)
Continue same rhinitis therapy as ordered

## 2022-05-15 NOTE — Patient Instructions (Signed)
Please continue your Breo once daily.  Rinse and gargle after using. Keep albuterol available to use 2 puffs if needed for shortness of breath, chest tightness, wheezing. We will perform a high-resolution CT scan of the chest to evaluate for any evidence of interstitial lung disease or scarring. Continue your Flonase and Prilosec as you have been taking them. Follow with Dr Delton Coombes next available after CT scan so we can review the results together.

## 2022-05-15 NOTE — Assessment & Plan Note (Signed)
Plan to perform high-resolution CT scan of the chest given her restriction on pulmonary function testing today.  Evaluate for interstitial lung disease.  Depending on course, dyspnea, may decide to perform echocardiogram to evaluate for PAH.

## 2022-05-15 NOTE — Progress Notes (Signed)
Attempted Full PFT Today. Performed Pre/Post Spirometry and Pleth.  

## 2022-05-15 NOTE — Assessment & Plan Note (Signed)
No clear evidence for obstruction on her pulmonary function testing does have a clinical benefit from [facility].  She never needs albuterol.  We will plan to continue the [facility] for now, control her contributing chronic rhinitis as well as possible.

## 2022-05-15 NOTE — Progress Notes (Signed)
Subjective:    Patient ID: [name], female    DOB: 11-30-1956, 66 y.o.   MRN: [id number]  HPI  ROV 04/03/21 --this is a follow-up visit for 66 year old woman with a history of scleroderma, Raynaud's phenomenon previously on methotrexate.  Also with hypertension, CKD, GERD.  We follow her for longstanding asthma with documented obstruction on spirometry.  She is also had multiple prior pneumonias from aspiration, some associated scar by chest imaging.  She was seen in our office in November and December 2021 with back discomfort, some increased bilateral infiltrates on chest x-ray for which she was treated with Augmentin for presumed recurrent pneumonia.  CT scan of the chest on 12/23/2020 reviewed by me, showed no evidence of pulmonary embolism, dilated fluid-filled esophagus without esophageal mass, resolutions of some apical upper lobe nodularity and groundglass infiltrate.  There were scattered areas of subpleural reticulation and multifocal lung scar, possible peripheral bronchiolectasis all unchanged. Today she reports that she is feeling better, is breathing better. She is having some B intermittent low back pain. She has minimal cough, no mucous.  She is managed on Breo, Flonase, Prilosec once daily.  Albuterol which she uses very rarely.  She is on lisinopril   ROV 05/15/22 --66 year old woman with a history of scleroderma and Raynaud's phenomenon, obstructive lung disease, dysphagia.  Have seen her for recurrent pulmonary infiltrates felt to be recurrent pneumonias possibly in the setting of recurrent aspiration.  She has been on Breo for her asthma, fluticasone nasal spray for chronic rhinitis.  She was seen for flare of asthmatic symptoms in January 2023, treated with prednisone and doxycycline. Today she reports some back pressure. No increase in cough, no sputum. Denies any dyspnea.. She is reliable w her [employer] [facility], never needs albuterol.   Pulmonary function testing performed today  reviewed by me show principally restriction with an FEV1 of 1.72 L (70% predicted).  No bronchodilator response.  Lung volumes are also restricted.  Diffusion capacity decreased but corrects to normal range when adjusted for alveolar volume.  The flow volume loop is also restricted without any evidence of obstruction.   Review of Systems  Constitutional:  Negative for fever and unexpected weight change.  HENT:  Negative for congestion, dental problem, ear pain, nosebleeds, postnasal drip, rhinorrhea, sinus pressure, sneezing, sore throat and trouble swallowing.   Eyes:  Negative for redness and itching.  Respiratory:  Negative for cough, chest tightness, shortness of breath and wheezing.   Cardiovascular:  Negative for palpitations and leg swelling.  Gastrointestinal:  Negative for nausea and vomiting.  Genitourinary:  Negative for dysuria.  Musculoskeletal:  Negative for joint swelling.  Skin:  Negative for rash.  Neurological:  Negative for headaches.  Hematological:  Does not bruise/bleed easily.  Psychiatric/Behavioral:  Negative for dysphoric mood. The patient is not nervous/anxious.     Past Medical History:  Diagnosis Date   Allergic rhinitis    Asthma    Atherosclerosis    Chronic kidney disease (CKD), stage III (moderate) (HCC)    Esophageal stricture    GERD (gastroesophageal reflux disease)    History of anemia due to chronic kidney disease    History of colitis 2017   Hypertension    Metabolic bone disease    Raynaud disease    Scleroderma ([place])      Family History  Problem Relation Age of Onset   Heart failure Mother    Hepatitis Mother        C   Dementia  Mother    COPD Mother        emphysema   Cancer Father        lung with mets to brain   Diabetes Sister    Hepatitis Brother    Cancer Sister        leukemia   Colon cancer Neg Hx      Social History   Socioeconomic History   Marital status: Single    Spouse name: Not on file   Number of  children: Not on file   Years of education: Not on file   Highest education level: Not on file  Occupational History   Not on file  Tobacco Use   Smoking status: Never   Smokeless tobacco: Never  Vaping Use   Vaping Use: Never used  Substance and Sexual Activity   Alcohol use: No   Drug use: No   Sexual activity: Not Currently  Other Topics Concern   Not on file  Social History Narrative   Not on file   Social Determinants of Health   Financial Resource Strain: Low Risk    Difficulty of Paying Living Expenses: Not hard at all  Food Insecurity: No Food Insecurity   Worried About Charity fundraiser in the Last Year: Never true   [place] in the Last Year: Never true  Transportation Needs: No Transportation Needs   Lack of Transportation (Medical): No   Lack of Transportation (Non-Medical): No  Physical Activity: Insufficiently Active   Days of Exercise per Week: 2 days   Minutes of Exercise per Session: 20 min  Stress: No Stress Concern Present   Feeling of Stress : Not at all  Social Connections: Not on file  Intimate Partner Violence: Not on file     Allergies  Allergen Reactions   Avelox [Moxifloxacin Hcl In Nacl]     Caused tachycardia   Bactrim [Sulfamethoxazole-Trimethoprim]    Codeine Hives and Other (See Comments)    Hallucination,    Penicillins Hives    No anaphylaxis/SJS symptoms, > 10 yr ago, no hospitalization required States she has tolerated amoxicillin      Outpatient Medications Prior to Visit  Medication Sig Dispense Refill   acetaminophen (TYLENOL) 325 MG tablet Take 325 mg by mouth every 6 (six) hours as needed for mild pain.     albuterol (PROAIR HFA) 108 (90 Base) MCG/ACT inhaler Inhale 2 puffs into the lungs every 6 (six) hours as needed for wheezing. 18 g 1   albuterol (PROVENTIL) (2.5 MG/3ML) 0.083% nebulizer solution Take 3 mLs (2.5 mg total) by nebulization every 6 (six) hours as needed for wheezing or shortness of breath. 75 mL  3   amLODipine (NORVASC) 5 MG tablet Take 5 mg by mouth daily.      Cholecalciferol (VITAMIN D3) 125 MCG (5000 UT) CAPS Take 1 capsule (5,000 Units total) by mouth daily. 90 capsule 1   fluticasone (FLONASE) 50 MCG/ACT nasal spray Place 1 spray into both nostrils daily.     fluticasone furoate-vilanterol (BREO ELLIPTA) 200-25 MCG/ACT AEPB Inhale 1 puff into the lungs daily. 3 each 1   lisinopril (ZESTRIL) 5 MG tablet Take 5 mg by mouth daily.     omeprazole (PRILOSEC) 20 MG capsule Take 20 mg by mouth daily.     pseudoephedrine (SUDAFED) 30 MG tablet Take 30 mg by mouth every 6 (six) hours as needed for congestion.     tiZANidine (ZANAFLEX) 4 MG tablet Take 0.5 tablets (2  mg total) by mouth at bedtime as needed for muscle spasms. 30 tablet 1   No facility-administered medications prior to visit.        Objective:   Physical Exam Vitals:   05/15/22 1002  BP: 122/70  Pulse: 68  Temp: 98 F (36.7 C)  TempSrc: Oral  SpO2: 96%  Weight: 112 lb 12.8 oz (51.2 kg)  Height: 5\' 4"  (1.626 m)   Gen: Pleasant, thin, in no distress,  normal affect  ENT: No lesions,  mouth clear,  oropharynx clear, no postnasal drip  Neck: No JVD, no stridor  Lungs: No use of accessory muscles, no crackles.  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  She never needs albuterol.  We will plan to continue the [facility] for now, control her contributing chronic rhinitis as well as possible.  Scleroderma ([place]) Plan to perform high-resolution CT scan of the chest given her restriction on pulmonary function testing today.   Evaluate for interstitial lung disease.  Depending on course, dyspnea, may decide to perform echocardiogram to evaluate for PAH.  GERD (gastroesophageal reflux disease) Continue same GERD therapy as ordered  Chronic rhinitis Continue same rhinitis therapy as ordered  Baltazar Apo, MD, PhD 05/15/2022, 5:08 PM [place] [facility] [phone] or if no answer [phone]

## 2022-05-22 DIAGNOSIS — N1832 Chronic kidney disease, stage 3b: Secondary | ICD-10-CM | POA: Diagnosis not present

## 2022-05-22 DIAGNOSIS — D631 Anemia in chronic kidney disease: Secondary | ICD-10-CM | POA: Diagnosis not present

## 2022-05-22 DIAGNOSIS — I129 Hypertensive chronic kidney disease with stage 1 through stage 4 chronic kidney disease, or unspecified chronic kidney disease: Secondary | ICD-10-CM | POA: Diagnosis not present

## 2022-05-22 DIAGNOSIS — M898X9 Other specified disorders of bone, unspecified site: Secondary | ICD-10-CM | POA: Diagnosis not present

## 2022-06-08 ENCOUNTER — Other Ambulatory Visit: Payer: Self-pay | Admitting: Medical

## 2022-06-08 ENCOUNTER — Other Ambulatory Visit: Payer: Self-pay

## 2022-06-08 NOTE — Patient Outreach (Signed)
[facility]) Care Management  06/08/2022  JADAH GABBARD [birth year] [id number]   Telephone call to patient for disease management follow up.  No answer.    Plan: RN CM will close case as patient stable and no hospitalizations.   Jone Baseman, RN, MSN [facility] Care Management Care Management Coordinator Direct Line [phone] Toll Free: [phone]  Fax: [phone]

## 2022-06-11 ENCOUNTER — Ambulatory Visit: Payer: Self-pay

## 2022-06-17 ENCOUNTER — Other Ambulatory Visit: Payer: Medicare PPO

## 2022-06-26 ENCOUNTER — Telehealth: Payer: Self-pay | Admitting: Emergency Medicine

## 2022-06-26 NOTE — Telephone Encounter (Signed)
Spoke to pt - nothing further needed at this time.

## 2022-06-26 NOTE — Telephone Encounter (Signed)
New insurance plan is in system (which I verified by cheryl-that's calling) a;so cheryl is asking for a call back to

## 2022-07-10 ENCOUNTER — Ambulatory Visit
Admission: RE | Admit: 2022-07-10 | Discharge: 2022-07-10 | Disposition: A | Discharge status: Home / Self Care | Payer: Medicare HMO | Source: Ambulatory Visit | Attending: Emergency Medicine | Admitting: Emergency Medicine

## 2022-07-10 DIAGNOSIS — J849 Interstitial pulmonary disease, unspecified: Secondary | ICD-10-CM

## 2022-07-10 DIAGNOSIS — J479 Bronchiectasis, uncomplicated: Secondary | ICD-10-CM | POA: Diagnosis not present

## 2022-07-10 DIAGNOSIS — I2510 Atherosclerotic heart disease of native coronary artery without angina pectoris: Secondary | ICD-10-CM | POA: Diagnosis not present

## 2022-07-10 DIAGNOSIS — I700 Atherosclerosis of aorta: Secondary | ICD-10-CM | POA: Diagnosis not present

## 2022-09-03 ENCOUNTER — Ambulatory Visit: Payer: Medicare HMO | Admitting: Emergency Medicine

## 2022-09-03 ENCOUNTER — Encounter: Payer: Self-pay | Admitting: Emergency Medicine

## 2022-09-03 DIAGNOSIS — J45909 Unspecified asthma, uncomplicated: Secondary | ICD-10-CM

## 2022-09-03 DIAGNOSIS — J849 Interstitial pulmonary disease, unspecified: Secondary | ICD-10-CM | POA: Diagnosis not present

## 2022-09-03 NOTE — Progress Notes (Signed)
Subjective:    Patient ID: [name], female    DOB: 1956-08-05, 66 y.o.   MRN: [id number]  HPI  ROV 09/03/2022 --follow-up visit 66 year old woman with history of scleroderma and Raynaud's phenomenon, associated dysphagia.  She also has obstructive lung disease.  I have seen her for recurrent scattered pulmonary infiltrates suspected related to chronic intermittent aspiration.  She has been managed on Breo, does get a clinical benefit from this. Has not needed any albuterol.   High-resolution CT scan of the chest on 07/10/2022 reviewed by me shows areas of patchy groundglass, septal thickening in the interstitium, some architectural distortion with mild cylindrical bronchiectasis.  No overt honeycomb change.  The left upper lobe groundglass change is more prominent than prior imaging.   Review of Systems  Constitutional:  Negative for fever and unexpected weight change.  HENT:  Negative for congestion, dental problem, ear pain, nosebleeds, postnasal drip, rhinorrhea, sinus pressure, sneezing, sore throat and trouble swallowing.   Eyes:  Negative for redness and itching.  Respiratory:  Negative for cough, chest tightness, shortness of breath and wheezing.   Cardiovascular:  Negative for palpitations and leg swelling.  Gastrointestinal:  Negative for nausea and vomiting.  Genitourinary:  Negative for dysuria.  Musculoskeletal:  Negative for joint swelling.  Skin:  Negative for rash.  Neurological:  Negative for headaches.  Hematological:  Does not bruise/bleed easily.  Psychiatric/Behavioral:  Negative for dysphoric mood. The patient is not nervous/anxious.     Past Medical History:  Diagnosis Date   Allergic rhinitis    Asthma    Atherosclerosis    Chronic kidney disease (CKD), stage III (moderate) (HCC)    Esophageal stricture    GERD (gastroesophageal reflux disease)    History of anemia due to chronic kidney disease    History of colitis 2017   Hypertension    Metabolic  bone disease    Raynaud disease    Scleroderma (HCC)      Family History  Problem Relation Age of Onset   Heart failure Mother    Hepatitis Mother        C   Dementia Mother    COPD Mother        emphysema   Cancer Father        lung with mets to brain   Diabetes Sister    Hepatitis Brother    Cancer Sister        leukemia   Colon cancer Neg Hx      Social History   Socioeconomic History   Marital status: Widowed    Spouse name: Not on file   Number of children: Not on file   Years of education: Not on file   Highest education level: Not on file  Occupational History   Not on file  Tobacco Use   Smoking status: Never   Smokeless tobacco: Never  Vaping Use   Vaping Use: Never used  Substance and Sexual Activity   Alcohol use: No   Drug use: No   Sexual activity: Not Currently  Other Topics Concern   Not on file  Social History Narrative   Not on file   Social Determinants of Health   Financial Resource Strain: Low Risk  (12/05/2021)   Overall Financial Resource Strain (CARDIA)    Difficulty of Paying Living Expenses: Not hard at all  Food Insecurity: No Food Insecurity (12/05/2021)   Hunger Vital Sign    Worried About Running Out of Food in the  Last Year: Never true    Ran Out of Food in the Last Year: Never true  Transportation Needs: No Transportation Needs (12/05/2021)   PRAPARE - Administrator, Civil Service (Medical): No    Lack of Transportation (Non-Medical): No  Physical Activity: Insufficiently Active (12/05/2021)   Exercise Vital Sign    Days of Exercise per Week: 2 days    Minutes of Exercise per Session: 20 min  Stress: No Stress Concern Present (12/05/2021)   [employer] of Occupational Health - Occupational Stress Questionnaire    Feeling of Stress : Not at all  Social Connections: Not on file  Intimate Partner Violence: Not on file     Allergies  Allergen Reactions   Avelox [Moxifloxacin Hcl In Nacl]     Caused  tachycardia   Bactrim [Sulfamethoxazole-Trimethoprim]    Codeine Hives and Other (See Comments)    Hallucination,    Penicillins Hives    No anaphylaxis/SJS symptoms, > 10 yr ago, no hospitalization required States she has tolerated amoxicillin      Outpatient Medications Prior to Visit  Medication Sig Dispense Refill   acetaminophen (TYLENOL) 325 MG tablet Take 325 mg by mouth every 6 (six) hours as needed for mild pain.     albuterol (PROAIR HFA) 108 (90 Base) MCG/ACT inhaler Inhale 2 puffs into the lungs every 6 (six) hours as needed for wheezing. 18 g 1   albuterol (PROVENTIL) (2.5 MG/3ML) 0.083% nebulizer solution Take 3 mLs (2.5 mg total) by nebulization every 6 (six) hours as needed for wheezing or shortness of breath. 75 mL 3   amLODipine (NORVASC) 5 MG tablet Take 5 mg by mouth daily.      BREO ELLIPTA 200-25 MCG/ACT AEPB INHALE 1 PUFF INTO THE LUNGS DAILY. 180 each 0   Cholecalciferol (VITAMIN D3) 125 MCG (5000 UT) CAPS Take 1 capsule (5,000 Units total) by mouth daily. 90 capsule 1   fluticasone (FLONASE) 50 MCG/ACT nasal spray Place 1 spray into both nostrils daily.     lisinopril (ZESTRIL) 5 MG tablet Take 5 mg by mouth daily.     omeprazole (PRILOSEC) 20 MG capsule Take 20 mg by mouth daily.     pseudoephedrine (SUDAFED) 30 MG tablet Take 30 mg by mouth every 6 (six) hours as needed for congestion.     tiZANidine (ZANAFLEX) 4 MG tablet Take 0.5 tablets (2 mg total) by mouth at bedtime as needed for muscle spasms. 30 tablet 1   No facility-administered medications prior to visit.        Objective:   Physical Exam Vitals:   09/03/22 1523  BP: 118/68  Pulse: 80  Temp: 97.7 F (36.5 C)  TempSrc: Oral  SpO2: 97%  Weight: 111 lb 6.4 oz (50.5 kg)  Height: 5\' 4"  (1.626 m)   Gen: Pleasant, thin, in no distress,  normal affect  ENT: No lesions,  mouth clear,  oropharynx clear, no postnasal drip  Neck: No JVD, no stridor  Lungs: No use of accessory muscles, no  crackles.  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  It looks like there may also be some slight progression of her peripheral interstitial changes and septal thickening.  Given these changes I would like for her to be seen and managed in our ILD clinic.  Suspect we will need to discuss her status with rheumatology.  She is not on any anti-inflammatory regimen at this time.    Intrinsic asthma Questionable diagnosis of asthma with reassuring pulmonary function testing, principally restriction on PFT.  That said she has been clinically benefiting from [facility]  We can continue this.  Baltazar Apo, MD, PhD 09/03/2022, 3:57 PM [place] [facility] [phone] or if no answer [phone]

## 2022-09-03 NOTE — Assessment & Plan Note (Signed)
Questionable diagnosis of asthma with reassuring pulmonary function testing, principally restriction on PFT.  That said she has been clinically benefiting from [facility].  We can continue this.

## 2022-09-03 NOTE — Assessment & Plan Note (Signed)
She has an area of patchy groundglass in the left upper lobe that I did not see on prior films.  It looks like there may also be some slight progression of her peripheral interstitial changes and septal thickening.  Given these changes I would like for her to be seen and managed in our ILD clinic.  Suspect we will need to discuss her status with rheumatology.  She is not on any anti-inflammatory regimen at this time.

## 2022-09-03 NOTE — Patient Instructions (Signed)
We reviewed your CT scan of the chest today. I will refer you to see one of my colleagues in the interstitial lung disease clinic (ILD Clinic) to review your CT.  We may want to discuss your CT with your rheumatologist, determine whether there is a relationship between your scleroderma and active inflammation in the lung. Continue your Breo 1 elation once daily.  Rinse and gargle after using. Keep albuterol available to use 2 puffs if needed for shortness of breath, chest tightness, wheezing. Get your echocardiogram annually as planned by rheumatology

## 2022-09-04 ENCOUNTER — Telehealth: Payer: Self-pay | Admitting: Emergency Medicine

## 2022-09-07 NOTE — Telephone Encounter (Signed)
Either provider would be fine

## 2022-09-07 NOTE — Telephone Encounter (Signed)
Patient is calling about the referral that you suggested in last office visit. I looked at AVS and it looks like for ILD. Are you wanting her to see Dr Marchelle Gearing for ILD or another provider sir?  Please advise

## 2022-09-09 NOTE — Telephone Encounter (Signed)
Patient has new patient appointment with PM for ILD. Nothing further needed

## 2022-09-21 ENCOUNTER — Ambulatory Visit: Payer: Medicare HMO | Admitting: Adult Health

## 2022-09-21 ENCOUNTER — Encounter: Payer: Self-pay | Admitting: Adult Health

## 2022-09-21 DIAGNOSIS — J849 Interstitial pulmonary disease, unspecified: Secondary | ICD-10-CM | POA: Diagnosis not present

## 2022-09-21 DIAGNOSIS — J209 Acute bronchitis, unspecified: Secondary | ICD-10-CM

## 2022-09-21 DIAGNOSIS — J45909 Unspecified asthma, uncomplicated: Secondary | ICD-10-CM

## 2022-09-21 MED ORDER — BENZONATATE 200 MG PO CAPS: 200.0000 mg | ORAL_CAPSULE | Freq: Three times a day (TID) | ORAL | 1 refills | Status: DC | PRN

## 2022-09-21 MED ORDER — AMOXICILLIN-POT CLAVULANATE 875-125 MG PO TABS: 1.0000 | ORAL_TABLET | Freq: Two times a day (BID) | ORAL | 0 refills | Status: DC

## 2022-09-21 NOTE — Progress Notes (Signed)
@Patient  ID: [name], female    DOB: 24-Nov-1956, 66 y.o.   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  Patient has a history of scleroderma and Raynaud's phenomenon, Kidney failure   TEST/EVENTS :  High-resolution CT scan of the chest on 07/10/2022 shows areas of patchy groundglass, septal thickening in the interstitium, some architectural distortion with mild cylindrical bronchiectasis.  No overt honeycomb change.  The left upper lobe groundglass change is more prominent than prior imaging.  09/21/2022 Acute OV  ; ILD , obstructive lung disease Patient presents for an acute office visit.  Patient complains over the last 10 days she has had increased cough, congestion with thick green mucus.  She has had no sick contacts.  She remains on Breo daily.  Rare use of her albuterol.  She denies any hemoptysis, fever, chest pain.  Is had general malaise.  Initially symptoms started out with cold-like symptoms with nasal congestion, postnasal drainage and ear fullness.  Symptoms have slowly improved but she continues to have ongoing cough with thick mucus.  Patient remains on Breo daily. Patient is on ACE inhibitor. Normally has no cough until she got sick recently.  Appetite is good with no nausea vomiting diarrhea    Allergies  Allergen Reactions   Avelox [Moxifloxacin Hcl In Nacl]     Caused tachycardia   Bactrim [Sulfamethoxazole-Trimethoprim]    Codeine Hives and Other (See Comments)    Hallucination,     Immunization History  Administered Date(s) Administered   Fluad Quad(high Dose 65+) 12/02/2020   H1N1 12/25/2008   Influenza Split 11/12/2015, 11/07/2016   Influenza Whole 09/27/2009   Influenza,inj,Quad PF,6+ Mos 10/30/2017, 10/29/2018, 10/27/2019   Influenza-Unspecified  12/28/2021   PFIZER(Purple Top)SARS-COV-2 Vaccination 04/05/2020, 05/03/2020, 09/05/2020   Pneumococcal Polysaccharide-23 12/04/2011    Past Medical History:  Diagnosis Date   Allergic rhinitis    Asthma    Atherosclerosis    Chronic kidney disease (CKD), stage III (moderate) (HCC)    Esophageal stricture    GERD (gastroesophageal reflux disease)    History of anemia due to chronic kidney disease    History of colitis 2017   Hypertension    Metabolic bone disease    Raynaud disease    Scleroderma ([place])     Tobacco History: Social History   Tobacco Use  Smoking Status Never  Smokeless Tobacco Never   Counseling given: Not Answered   Outpatient Medications Prior to Visit  Medication Sig Dispense Refill   acetaminophen (TYLENOL) 325 MG tablet Take 325 mg by mouth every 6 (six) hours as needed for mild pain.     albuterol (PROAIR HFA) 108 (90 Base) MCG/ACT inhaler Inhale 2 puffs into the lungs every 6 (six) hours as needed for wheezing. 18 g 1   albuterol (PROVENTIL) (2.5 MG/3ML) 0.083% nebulizer solution Take 3 mLs (2.5 mg total) by nebulization every 6 (six) hours as needed for wheezing or shortness of breath. 75 mL 3   amLODipine (NORVASC) 5 MG tablet Take 5 mg by mouth daily.      BREO ELLIPTA 200-25 MCG/ACT AEPB INHALE 1 PUFF INTO THE LUNGS DAILY. 180 each 0   Cholecalciferol (VITAMIN D3) 125 MCG (5000 UT) CAPS Take 1 capsule (5,000 Units total) by mouth daily. 90 capsule 1   fluticasone (FLONASE) 50  MCG/ACT nasal spray Place 1 spray into both nostrils daily.     lisinopril (ZESTRIL) 5 MG tablet Take 5 mg by mouth daily.     omeprazole (PRILOSEC) 20 MG capsule Take 20 mg by mouth daily.     pseudoephedrine (SUDAFED) 30 MG tablet Take 30 mg by mouth every 6 (six) hours as needed for congestion.     tiZANidine (ZANAFLEX) 4 MG tablet Take 0.5 tablets (2 mg total) by mouth at bedtime as needed for muscle spasms. 30 tablet 1   No facility-administered medications prior to  visit.     Review of Systems:   Constitutional:   No  weight loss, night sweats,  Fevers, chills, fatigue, or  lassitude.  HEENT:   No headaches,  Difficulty swallowing,  Tooth/dental problems, or  Sore throat,                No sneezing, itching, ear ache,  +nasal congestion, post nasal drip,   CV:  No chest pain,  Orthopnea, PND, swelling in lower extremities, anasarca, dizziness, palpitations, syncope.   GI  No heartburn, indigestion, abdominal pain, nausea, vomiting, diarrhea, change in bowel habits, loss of appetite, bloody stools.   Resp:   No chest wall deformity  Skin: no rash or lesions.  GU: no dysuria, change in color of urine, no urgency or frequency.  No flank pain, no hematuria   MS:  No joint pain or swelling.  No decreased range of motion.  No back pain.    Physical Exam  BP 96/62 (BP Location: Left Arm, Patient Position: Sitting, Cuff Size: Normal)   Pulse 71   Temp 98 F (36.7 C) (Oral)   Ht 5\' 4"  (1.626 m)   Wt 113 lb 3.2 oz (51.3 kg)   SpO2 99% Comment: RA  BMI 19.43 kg/m   GEN: A/Ox3; pleasant , NAD, well nourished    HEENT:  [place]/AT,  NOSE-clear, THROAT-clear, no lesions, no postnasal drip or exudate noted.   NECK:  Supple w/ fair ROM; no JVD; normal carotid impulses w/o bruits; no thyromegaly or nodules palpated; no lymphadenopathy.    RESP scattered rhonchi no accessory muscle use, no dullness to percussion  CARD:  RRR, no m/r/g, no peripheral edema, pulses intact, no cyanosis or clubbing.  GI:   Soft & nt; nml bowel sounds; no organomegaly or masses detected.   Musco: Warm bil, no deformities or joint swelling noted.   Neuro: alert, no focal deficits noted.    Skin: Warm, sclerodactyly in bilateral hands.    Lab Results:    BMET     Imaging: No results found.       Latest Ref Rng & Units 05/15/2022    8:46 AM  PFT Results  FVC-Pre L 1.88   FVC-Predicted Pre % 58   FVC-Post L 1.86   FVC-Predicted Post % 58   Pre  FEV1/FVC % % 91   Post FEV1/FCV % % 95   FEV1-Pre L 1.72   FEV1-Predicted Pre % 70   FEV1-Post L 1.76   DLCO uncorrected ml/min/mmHg 10.55   DLCO UNC% % 52   DLCO corrected ml/min/mmHg 10.55   DLCO COR %Predicted % 52   DLVA Predicted % 87   TLC L 3.90   TLC % Predicted % 77   RV % Predicted % 112     No results found for: "NITRICOXIDE"      Assessment & Plan:   BRONCHITIS, ACUTE Acute bronchitis.  We will treat with  empiric antibiotics.  Plan  Patient Instructions  Augmentin 875mg  Twice daily  for 1 week with food.  Delsym 2 tsp Twice daily  for cough As needed  Tessalon Three times a day  As needed cough Continue on BREO 1 puff daily  Albuterol inhaler As needed   Follow up with Dr. Vaughan Browner in 6 weeks and As needed  Please contact office for sooner follow up if symptoms do not improve or worsen or seek emergency care        ILD (interstitial lung disease) ([place]) ILD related to connective tissue disorder-patient has known history of scleroderma and Raynaud's phenomenon-for by rheumatology.  Has an upcoming appointment with Dr. Vaughan Browner for ILD  Continue current regimen  Intrinsic asthma Continue on current regimen     Chrysta Fulcher, NP 09/21/2022

## 2022-09-21 NOTE — Assessment & Plan Note (Signed)
Continue on current regimen .   

## 2022-09-21 NOTE — Patient Instructions (Addendum)
Augmentin 875mg  Twice daily  for 1 week with food.  Delsym 2 tsp Twice daily  for cough As needed  Tessalon Three times a day  As needed cough Continue on BREO 1 puff daily  Albuterol inhaler As needed   Follow up with Dr. Vaughan Browner in 6 weeks and As needed  Please contact office for sooner follow up if symptoms do not improve or worsen or seek emergency care

## 2022-09-21 NOTE — Assessment & Plan Note (Signed)
Acute bronchitis.  We will treat with empiric antibiotics.  Plan  Patient Instructions  Augmentin 875mg  Twice daily  for 1 week with food.  Delsym 2 tsp Twice daily  for cough As needed  Tessalon Three times a day  As needed cough Continue on BREO 1 puff daily  Albuterol inhaler As needed   Follow up with Dr. Vaughan Browner in 6 weeks and As needed  Please contact office for sooner follow up if symptoms do not improve or worsen or seek emergency care

## 2022-09-21 NOTE — Assessment & Plan Note (Signed)
ILD related to connective tissue disorder-patient has known history of scleroderma and Raynaud's phenomenon-for by rheumatology.  Has an upcoming appointment with Dr. Vaughan Browner for ILD  Continue current regimen

## 2022-09-22 ENCOUNTER — Telehealth: Payer: Self-pay | Admitting: Adult Health

## 2022-09-23 NOTE — Telephone Encounter (Signed)
Patient calling back about albuterol inhaler. Please call patient when script is sent.

## 2022-09-24 MED ORDER — ALBUTEROL SULFATE HFA 108 (90 BASE) MCG/ACT IN AERS: 2.0000 | INHALATION_SPRAY | Freq: Four times a day (QID) | RESPIRATORY_TRACT | 6 refills | Status: DC | PRN

## 2022-09-24 NOTE — Telephone Encounter (Signed)
Patient checking on message for Albuterol inhaler. Pharmacy is [facility] [address] [place]. Patient phone number is [phone].

## 2022-09-24 NOTE — Telephone Encounter (Signed)
Spoke with [facility] who states they did not have an order for an Albuterol inhaler. Order was placed per last OV AVS by Tammy Parrett. Notified pt. Nothing further needed at this time.

## 2022-09-25 ENCOUNTER — Ambulatory Visit
Admission: RE | Admit: 2022-09-25 | Discharge: 2022-09-25 | Disposition: A | Discharge status: Home / Self Care | Payer: Medicare HMO | Source: Ambulatory Visit | Attending: Medical | Admitting: Medical

## 2022-09-25 DIAGNOSIS — Z780 Asymptomatic menopausal state: Secondary | ICD-10-CM | POA: Diagnosis not present

## 2022-09-25 DIAGNOSIS — R7989 Other specified abnormal findings of blood chemistry: Secondary | ICD-10-CM

## 2022-09-25 DIAGNOSIS — E2839 Other primary ovarian failure: Secondary | ICD-10-CM

## 2022-09-25 DIAGNOSIS — M898X9 Other specified disorders of bone, unspecified site: Secondary | ICD-10-CM

## 2022-09-25 DIAGNOSIS — M349 Systemic sclerosis, unspecified: Secondary | ICD-10-CM

## 2022-09-25 DIAGNOSIS — M810 Age-related osteoporosis without current pathological fracture: Secondary | ICD-10-CM | POA: Diagnosis not present

## 2022-10-29 DIAGNOSIS — M810 Age-related osteoporosis without current pathological fracture: Secondary | ICD-10-CM | POA: Diagnosis not present

## 2022-10-29 DIAGNOSIS — M79643 Pain in unspecified hand: Secondary | ICD-10-CM | POA: Diagnosis not present

## 2022-10-29 DIAGNOSIS — I351 Nonrheumatic aortic (valve) insufficiency: Secondary | ICD-10-CM | POA: Diagnosis not present

## 2022-10-29 DIAGNOSIS — I7300 Raynaud's syndrome without gangrene: Secondary | ICD-10-CM | POA: Diagnosis not present

## 2022-10-29 DIAGNOSIS — N189 Chronic kidney disease, unspecified: Secondary | ICD-10-CM | POA: Diagnosis not present

## 2022-10-29 DIAGNOSIS — R21 Rash and other nonspecific skin eruption: Secondary | ICD-10-CM | POA: Diagnosis not present

## 2022-10-29 DIAGNOSIS — M349 Systemic sclerosis, unspecified: Secondary | ICD-10-CM | POA: Diagnosis not present

## 2022-10-29 DIAGNOSIS — D649 Anemia, unspecified: Secondary | ICD-10-CM | POA: Diagnosis not present

## 2022-10-30 ENCOUNTER — Encounter: Payer: Self-pay | Admitting: Pulmonary Disease

## 2022-10-30 ENCOUNTER — Ambulatory Visit: Payer: Medicare HMO | Admitting: Pulmonary Disease

## 2022-10-30 VITALS — BP 110/64 | HR 74 | Temp 97.6°F | Ht 64.5 in | Wt 113.0 lb

## 2022-10-30 DIAGNOSIS — K222 Esophageal obstruction: Secondary | ICD-10-CM | POA: Diagnosis not present

## 2022-10-30 DIAGNOSIS — Z23 Encounter for immunization: Secondary | ICD-10-CM | POA: Diagnosis not present

## 2022-10-30 DIAGNOSIS — J849 Interstitial pulmonary disease, unspecified: Secondary | ICD-10-CM

## 2022-10-30 NOTE — Patient Instructions (Addendum)
We will give you a flu vaccine today Referral to Dr. Henrene Pastor from [facility] gastroenterology for evaluation of esophageal stricture Order follow-up high-resolution CT and PFTs in 3 months Return to clinic after these tests.

## 2022-10-30 NOTE — Progress Notes (Signed)
[name]    [id number]    1956/06/17  Primary Care Physician:Tysinger, Kermit Balo, PA-C  Referring Physician: Jac Canavan, PA-C [address] [place],  [place] [address]  Chief complaint: Consult for interstitial lung disease  HPI: 66 y.o. who  has a past medical history of Allergic rhinitis, Asthma, Atherosclerosis, Chronic kidney disease (CKD), stage III (moderate) (HCC), Esophageal stricture, GERD (gastroesophageal reflux disease), History of anemia due to chronic kidney disease, History of colitis (2017), Hypertension, Metabolic bone disease, Raynaud disease, and Scleroderma (HCC).   History of scleroderma, Raynaud's disease diagnosed with skin biopsy in 2004.  She was on methotrexate from 2000 03/29/2004 and currently not on treatment.  Follows with Dr. Deanne Coffer from rheumatology  History also significant for asthma, esophageal dysmotility and stricture.  She was previously followed by Dr. Marina Goodell and had esophageal dilatation in the past.  Currently on Prilosec and she is managing with elevation of the head of the bed.  Followed with Dr. Delton Coombes.  She has been referred for evaluation of abnormal CT  Pets: Dogs, [place] pigs Occupation: Used to work in [employer] in her history.  Currently on disability Exposures: No mold, hot tub, Jacuzzi.  No feather pillows or comforters Smoking history: Never smoker Travel history: No significant travel history Relevant family history: No family history of lung disease  Outpatient Encounter Medications as of 10/30/2022  Medication Sig   acetaminophen (TYLENOL) 325 MG tablet Take 325 mg by mouth every 6 (six) hours as needed for mild pain.   albuterol (PROVENTIL) (2.5 MG/3ML) 0.083% nebulizer solution Take 3 mLs (2.5 mg total) by nebulization every 6 (six) hours as needed for wheezing or shortness of breath.   albuterol (VENTOLIN HFA) 108 (90 Base) MCG/ACT inhaler Inhale 2 puffs into the lungs every 6 (six) hours as needed for  wheezing or shortness of breath.   amLODipine (NORVASC) 5 MG tablet Take 5 mg by mouth daily.    BREO ELLIPTA 200-25 MCG/ACT AEPB INHALE 1 PUFF INTO THE LUNGS DAILY.   Cholecalciferol (VITAMIN D3) 125 MCG (5000 UT) CAPS Take 1 capsule (5,000 Units total) by mouth daily.   fluticasone (FLONASE) 50 MCG/ACT nasal spray Place 1 spray into both nostrils daily.   lisinopril (ZESTRIL) 5 MG tablet Take 5 mg by mouth daily.   omeprazole (PRILOSEC) 20 MG capsule Take 20 mg by mouth daily.   pseudoephedrine (SUDAFED) 30 MG tablet Take 30 mg by mouth every 6 (six) hours as needed for congestion.   tiZANidine (ZANAFLEX) 4 MG tablet Take 0.5 tablets (2 mg total) by mouth at bedtime as needed for muscle spasms.   [DISCONTINUED] amoxicillin-clavulanate (AUGMENTIN) 875-125 MG tablet Take 1 tablet by mouth 2 (two) times daily.   [DISCONTINUED] benzonatate (TESSALON) 200 MG capsule Take 1 capsule (200 mg total) by mouth 3 (three) times daily as needed.   [DISCONTINUED] famotidine (PEPCID) 20 MG tablet Take 20 mg by mouth 2 (two) times daily.     No facility-administered encounter medications on file as of 10/30/2022.    Allergies as of 10/30/2022 - Review Complete 10/30/2022  Allergen Reaction Noted   Avelox [moxifloxacin hcl in nacl]  05/14/2011   Bactrim [sulfamethoxazole-trimethoprim]  02/12/2022   Codeine Hives and Other (See Comments) 05/24/2008    Past Medical History:  Diagnosis Date   Allergic rhinitis    Asthma    Atherosclerosis    Chronic kidney disease (CKD), stage III (moderate) (HCC)    Esophageal  stricture    GERD (gastroesophageal reflux disease)    History of anemia due to chronic kidney disease    History of colitis 2017   Hypertension    Metabolic bone disease    Raynaud disease    Scleroderma (HCC)     Past Surgical History:  Procedure Laterality Date   CESAREAN SECTION     COLONOSCOPY WITH PROPOFOL N/A 07/14/2016   Procedure: COLONOSCOPY WITH PROPOFOL;  Surgeon: Rachael Fee, MD;  Location: [facility];  Service: Endoscopy;  Laterality: N/A;   HAND SURGERY     left; tendon repair   TONSILLECTOMY      Family History  Problem Relation Age of Onset   Heart failure Mother    Hepatitis Mother        C   Dementia Mother    COPD Mother        emphysema   Cancer Father        lung with mets to brain   Diabetes Sister    Hepatitis Brother    Cancer Sister        leukemia   Colon cancer Neg Hx     Social History   Socioeconomic History   Marital status: Widowed    Spouse name: Not on file   Number of children: Not on file   Years of education: Not on file   Highest education level: Not on file  Occupational History   Not on file  Tobacco Use   Smoking status: Never   Smokeless tobacco: Never  Vaping Use   Vaping Use: Never used  Substance and Sexual Activity   Alcohol use: No   Drug use: No   Sexual activity: Not Currently  Other Topics Concern   Not on file  Social History Narrative   Not on file   Social Determinants of Health   Financial Resource Strain: Low Risk  (12/05/2021)   Overall Financial Resource Strain (CARDIA)    Difficulty of Paying Living Expenses: Not hard at all  Food Insecurity: No Food Insecurity (12/05/2021)   Hunger Vital Sign    Worried About Running Out of Food in the Last Year: Never true    Ran Out of Food in the Last Year: Never true  Transportation Needs: No Transportation Needs (12/05/2021)   PRAPARE - Administrator, Civil Service (Medical): No    Lack of Transportation (Non-Medical): No  Physical Activity: Insufficiently Active (12/05/2021)   Exercise Vital Sign    Days of Exercise per Week: 2 days    Minutes of Exercise per Session: 20 min  Stress: No Stress Concern Present (12/05/2021)   [employer] of Occupational Health - Occupational Stress Questionnaire    Feeling of Stress : Not at all  Social Connections: Not on file  Intimate Partner Violence: Not on file    Review of  systems: Review of Systems  Constitutional: Negative for fever and chills.  HENT: Negative.   Eyes: Negative for blurred vision.  Respiratory: as per HPI  Cardiovascular: Negative for chest pain and palpitations.  Gastrointestinal: Negative for vomiting, diarrhea, blood per rectum. Genitourinary: Negative for dysuria, urgency, frequency and hematuria.  Musculoskeletal: Negative for myalgias, back pain and joint pain.  Skin: Negative for itching and rash.  Neurological: Negative for dizziness, tremors, focal weakness, seizures and loss of consciousness.  Endo/Heme/Allergies: Negative for environmental allergies.  Psychiatric/Behavioral: Negative for depression, suicidal ideas and hallucinations.  All other systems reviewed and are negative.  Physical  Exam: Blood pressure 110/64, pulse 74, temperature 97.6 F (36.4 C), temperature source Oral, height 5' 4.5" (1.638 m), weight 113 lb (51.3 kg), SpO2 100 %. Gen:      No acute distress HEENT:  EOMI, sclera anicteric Neck:     No masses; no thyromegaly Lungs:    Clear to auscultation bilaterally; normal respiratory effort CV:         Regular rate and rhythm; no murmurs Abd:      + bowel sounds; soft, non-tender; no palpable masses, no distension Ext:    No edema; adequate peripheral perfusion Skin:      Warm and dry; no rash Neuro: alert and oriented x 3 Psych: normal mood and affect  Data Reviewed: Imaging: CT chest 12/05/2019-increasing groundglass opacities in the upper lobes.  CT chest 05/30/2020-resolution of groundglass, septal thickening with nodules in the bilateral upper lobes.  CTA 12/23/2020-no pulmonary embolism, resolution of multifocal lung nodules and groundglass in the upper lobe.  Subpleural reticulation scattered bronchiectasis.  CT high-resolution 07/10/2022-patchy areas of groundglass attenuation and septal thickening with few areas of mild cylindrical bronchiectasis and peripheral bronchiolectasis with no gradient.   Alternate diagnosis.  Patulous fluid-filled esophagus, coronary and aortic atherosclerosis.  I have reviewed the images personally.  PFTs: 05/15/2022 FEV1 1.72 [70%] FVC 1.88 [58%], F/F 91, TLC 3.90 [77%], DLCO 10.55 [50%] Restrictive lung disease, diffusion impairment.  Labs:   Assessment:  Evaluation for interstitial lung disease She has longstanding history of scleroderma and Raynaud's syndrome.  Her CT scan on review shows waxing and waning groundglass opacities in the upper lobes and mild reticulation.  This may be secondary to interstitial lung disease secondary to scleroderma but I am impressed with the size of her esophagus and presence of debris's and a fluid level.  Given waxing and waning nature of her groundglass opacities I suspect chronic recurrent aspiration.  She does have history of esophageal stricture, esophageal dysfunction and has not followed up with Dr. Henrene Pastor from gastroenterology.  I will have her reevaluated with referral to GI Continue PPI  Order high-resolution CT and PFTs follow-up in 3 months.  Plan/Recommendations: High-resolution CT, PFTs Referral to GI, Dr. Christianne Dolin MD [facility] 10/30/2022, 10:37 AM  CC: Carlena Hurl, PA-C

## 2022-11-20 DIAGNOSIS — N1832 Chronic kidney disease, stage 3b: Secondary | ICD-10-CM | POA: Diagnosis not present

## 2022-11-27 DIAGNOSIS — D631 Anemia in chronic kidney disease: Secondary | ICD-10-CM | POA: Diagnosis not present

## 2022-11-27 DIAGNOSIS — I129 Hypertensive chronic kidney disease with stage 1 through stage 4 chronic kidney disease, or unspecified chronic kidney disease: Secondary | ICD-10-CM | POA: Diagnosis not present

## 2022-11-27 DIAGNOSIS — N1832 Chronic kidney disease, stage 3b: Secondary | ICD-10-CM | POA: Diagnosis not present

## 2022-11-27 DIAGNOSIS — M898X9 Other specified disorders of bone, unspecified site: Secondary | ICD-10-CM | POA: Diagnosis not present

## 2022-12-02 ENCOUNTER — Other Ambulatory Visit: Payer: Self-pay | Admitting: Medical

## 2022-12-02 NOTE — Telephone Encounter (Signed)
Got pt. Scheduled 01/15/23 with you.

## 2022-12-02 NOTE — Telephone Encounter (Signed)
Refill request last apt 03/27/22.

## 2022-12-11 ENCOUNTER — Ambulatory Visit: Payer: Medicare PPO

## 2022-12-29 ENCOUNTER — Ambulatory Visit (INDEPENDENT_AMBULATORY_CARE_PROVIDER_SITE_OTHER): Payer: Medicare HMO

## 2022-12-29 VITALS — Ht 64.0 in | Wt 112.0 lb

## 2022-12-29 DIAGNOSIS — Z0000 Encounter for general adult medical examination without abnormal findings: Secondary | ICD-10-CM | POA: Diagnosis not present

## 2022-12-29 NOTE — Progress Notes (Addendum)
Subjective:   [name] is a 67 y.o. female who presents for Medicare Annual (Subsequent) preventive examination.  I connected with  [name] on 01/08/23 by a audio enabled telemedicine application and verified that I am speaking with the correct person using two identifiers.  Patient Location: Home  Provider Location: Office/Clinic  I discussed the limitations of evaluation and management by telemedicine. The patient expressed understanding and agreed to proceed.  Review of Systems     Cardiac Risk Factors include: advanced age (>41men, >49 women);sedentary lifestyle;hypertension     Objective:    Today's Vitals   12/29/22 0918  Weight: 112 lb (50.8 kg)  Height: 5\' 4"  (1.626 m)   Body mass index is 19.22 kg/m.     12/29/2022    9:24 AM 12/05/2021    1:31 PM 12/19/2019    9:11 AM 12/01/2019    3:10 PM 04/26/2017    4:59 PM 07/14/2016    9:32 AM 07/10/2016   10:37 PM  Advanced Directives  Does Patient Have a Medical Advance Directive? No No No No No No No  Would patient like information on creating a medical advance directive? Yes (MAU/Ambulatory/Procedural Areas - Information given)  No - Patient declined Yes (ED - Information included in AVS) No - Patient declined No - patient declined information No - patient declined information    Current Medications (verified) Outpatient Encounter Medications as of 12/29/2022  Medication Sig   acetaminophen (TYLENOL) 325 MG tablet Take 325 mg by mouth every 6 (six) hours as needed for mild pain.   albuterol (PROVENTIL) (2.5 MG/3ML) 0.083% nebulizer solution Take 3 mLs (2.5 mg total) by nebulization every 6 (six) hours as needed for wheezing or shortness of breath.   albuterol (VENTOLIN HFA) 108 (90 Base) MCG/ACT inhaler Inhale 2 puffs into the lungs every 6 (six) hours as needed for wheezing or shortness of breath.   amLODipine (NORVASC) 5 MG tablet Take 5 mg by mouth daily.    BREO ELLIPTA 200-25 MCG/ACT AEPB INHALE 1 PUFF  INTO THE LUNGS DAILY.   Cholecalciferol (VITAMIN D3) 125 MCG (5000 UT) CAPS Take 1 capsule (5,000 Units total) by mouth daily.   fluticasone (FLONASE) 50 MCG/ACT nasal spray Place 1 spray into both nostrils daily.   lisinopril (ZESTRIL) 5 MG tablet Take 5 mg by mouth daily.   omeprazole (PRILOSEC) 20 MG capsule Take 20 mg by mouth daily.   pseudoephedrine (SUDAFED) 30 MG tablet Take 30 mg by mouth every 6 (six) hours as needed for congestion.   tiZANidine (ZANAFLEX) 4 MG tablet Take 0.5 tablets (2 mg total) by mouth at bedtime as needed for muscle spasms.   [DISCONTINUED] famotidine (PEPCID) 20 MG tablet Take 20 mg by mouth 2 (two) times daily.     No facility-administered encounter medications on file as of 12/29/2022.    Allergies (verified) Avelox [moxifloxacin hcl in nacl], Bactrim [sulfamethoxazole-trimethoprim], and Codeine   History: Past Medical History:  Diagnosis Date   Allergic rhinitis    Asthma    Atherosclerosis    Chronic kidney disease (CKD), stage III (moderate) (HCC)    Esophageal stricture    GERD (gastroesophageal reflux disease)    History of anemia due to chronic kidney disease    History of colitis 2017   Hypertension    Metabolic bone disease    Raynaud disease    Scleroderma ([place])    Past Surgical History:  Procedure Laterality Date   CESAREAN SECTION  COLONOSCOPY WITH PROPOFOL N/A 07/14/2016   Procedure: COLONOSCOPY WITH PROPOFOL;  Surgeon: Milus Banister, MD;  Location: [facility];  Service: Endoscopy;  Laterality: N/A;   HAND SURGERY     left; tendon repair   TONSILLECTOMY     Family History  Problem Relation Age of Onset   Heart failure Mother    Hepatitis Mother        C   Dementia Mother    COPD Mother        emphysema   Cancer Father        lung with mets to brain   Diabetes Sister    Hepatitis Brother    Cancer Sister        leukemia   Colon cancer Neg Hx    Social History   Socioeconomic History   Marital status: Widowed     Spouse name: Not on file   Number of children: Not on file   Years of education: Not on file   Highest education level: Not on file  Occupational History   Not on file  Tobacco Use   Smoking status: Never   Smokeless tobacco: Never  Vaping Use   Vaping Use: Never used  Substance and Sexual Activity   Alcohol use: No   Drug use: No   Sexual activity: Not Currently  Other Topics Concern   Not on file  Social History Narrative   Not on file   Social Determinants of Health   Financial Resource Strain: Low Risk  (12/29/2022)   Overall Financial Resource Strain (CARDIA)    Difficulty of Paying Living Expenses: Not hard at all  Food Insecurity: No Food Insecurity (12/29/2022)   Hunger Vital Sign    Worried About Running Out of Food in the Last Year: Never true    [place] in the Last Year: Never true  Transportation Needs: No Transportation Needs (12/29/2022)   PRAPARE - Hydrologist (Medical): No    Lack of Transportation (Non-Medical): No  Physical Activity: Insufficiently Active (12/29/2022)   Exercise Vital Sign    Days of Exercise per Week: 3 days    Minutes of Exercise per Session: 30 min  Stress: No Stress Concern Present (12/29/2022)   [place]    Feeling of Stress : Not at all  Social Connections: Moderately Isolated (12/29/2022)   Social Connection and Isolation Panel [NHANES]    Frequency of Communication with Friends and Family: More than three times a week    Frequency of Social Gatherings with Friends and Family: Three times a week    Attends Religious Services: More than 4 times per year    Active Member of Clubs or Organizations: No    Attends Archivist Meetings: Never    Marital Status: Widowed    Tobacco Counseling Counseling given: Not Answered   Clinical Intake:  Pre-visit preparation completed: Yes  Pain : No/denies pain  Diabetes:  No  How often do you need to have someone help you when you read instructions, pamphlets, or other written materials from your doctor or pharmacy?: 1 - Never  Diabetic?No   Interpreter Needed?: No  Information entered by :: Denman George LPN   Activities of Daily Living    12/29/2022    9:24 AM  In your present state of health, do you have any difficulty performing the following activities:  Hearing? 0  Vision? 0  Difficulty  concentrating or making decisions? 0  Walking or climbing stairs? 0  Dressing or bathing? 0  Doing errands, shopping? 0  Preparing Food and eating ? N  Using the Toilet? N  In the past six months, have you accidently leaked urine? N  Do you have problems with loss of bowel control? N  Managing your Medications? N  Managing your Finances? N  Housekeeping or managing your Housekeeping? N    Patient Care Team: Tysinger, Camelia Eng, PA-C as PCP - General (Family Medicine) Lahoma Rocker, MD as Consulting Physician (Rheumatology) Collene Gobble, MD as Consulting Physician (Pulmonary Disease) Elmarie Shiley, MD as Consulting Physician (Nephrology)  Indicate any recent Medical Services you may have received from other than [facility] providers in the past year (date may be approximate).     Assessment:   This is a routine wellness examination for Kalii.  Hearing/Vision screen Hearing Screening - Comments:: Denies hearing difficulties   Vision Screening - Comments:: Wears rx glasses - up to date with routine eye exams; plans to schedule appointment for this year    Dietary issues and exercise activities discussed: Current Exercise Habits: Home exercise routine, Type of exercise: walking, Time (Minutes): 30, Frequency (Times/Week): 3, Weekly Exercise (Minutes/Week): 90, Intensity: Mild   Goals Addressed             This Visit's Progress    Patient Stated   On track    12/05/2021, stay healthy      Depression Screen    12/29/2022    9:23 AM 02/12/2022     1:38 PM 12/11/2021    3:32 PM 12/05/2021    1:32 PM 10/30/2021   10:34 AM 07/31/2021   10:19 AM 01/29/2021    9:32 AM  PHQ 2/9 Scores  PHQ - 2 Score 0 0 0 0 0 0 0    Fall Risk    12/29/2022    9:19 AM 02/12/2022    1:38 PM 12/11/2021    3:32 PM 12/05/2021    1:31 PM 10/30/2021   10:34 AM  [place] in the past year? 0 0 0 0 0  Number falls in past yr: 0 0 0    Injury with Fall? 0 0 0    Risk for fall due to : No Fall Risks No Fall Risks No Fall Risks Medication side effect   Follow up Falls evaluation completed;Education provided;Falls prevention discussed Falls evaluation completed Falls evaluation completed Falls evaluation completed;Education provided;Falls prevention discussed     FALL RISK PREVENTION PERTAINING TO THE HOME:  Any stairs in or around the home? Yes  If so, are there any without handrails? No  Home free of loose throw rugs in walkways, pet beds, electrical cords, etc? Yes  Adequate lighting in your home to reduce risk of falls? Yes   ASSISTIVE DEVICES UTILIZED TO PREVENT FALLS:  Life alert? No  Use of a cane, walker or w/c? No  Grab bars in the bathroom? Yes  Shower chair or bench in shower? No  Elevated toilet seat or a handicapped toilet? No   TIMED UP AND GO:  Was the test performed? No . Telephonic visit   Cognitive Function:        12/29/2022    9:24 AM 12/05/2021    1:33 PM  6CIT Screen  What Year? 0 points 0 points  What month? 0 points 0 points  What time? 0 points 0 points  Count back from 20 0 points 0  points  Months in reverse 0 points 0 points  Repeat phrase 0 points 0 points  Total Score 0 points 0 points    Immunizations Immunization History  Administered Date(s) Administered   Fluad Quad(high Dose 65+) 12/02/2020, 10/30/2022   H1N1 12/25/2008   Influenza Split 11/12/2015, 11/07/2016   Influenza Whole 09/27/2009   Influenza,inj,Quad PF,6+ Mos 10/30/2017, 10/29/2018, 10/27/2019   Influenza-Unspecified 12/28/2021    PFIZER(Purple Top)SARS-COV-2 Vaccination 04/05/2020, 05/03/2020, 09/05/2020   Pneumococcal Polysaccharide-23 12/04/2011    TDAP status: Due, Education has been provided regarding the importance of this vaccine. Advised may receive this vaccine at local pharmacy or Health Dept. Aware to provide a copy of the vaccination record if obtained from local pharmacy or Health Dept. Verbalized acceptance and understanding.  Flu Vaccine status: Up to date  Pneumococcal vaccine status: Due, Education has been provided regarding the importance of this vaccine. Advised may receive this vaccine at local pharmacy or Health Dept. Aware to provide a copy of the vaccination record if obtained from local pharmacy or Health Dept. Verbalized acceptance and understanding.  Covid-19 vaccine status: Information provided on how to obtain vaccines.   Qualifies for Shingles Vaccine? Yes   Zostavax completed No   Shingrix Completed?: No.    Education has been provided regarding the importance of this vaccine. Patient has been advised to call insurance company to determine out of pocket expense if they have not yet received this vaccine. Advised may also receive vaccine at local pharmacy or Health Dept. Verbalized acceptance and understanding.  Screening Tests Health Maintenance  Topic Date Due   DTaP/Tdap/Td (1 - Tdap) Never done   PAP SMEAR-Modifier  Never done   Zoster Vaccines- Shingrix (1 of 2) Never done   COVID-19 Vaccine (4 - 2023-24 season) 08/28/2022   Pneumonia Vaccine 66+ Years old (2 - PCV) 09/14/2022   MAMMOGRAM  12/30/2023 (Originally 03/03/2020)   Medicare Annual Wellness (AWV)  12/30/2023   COLONOSCOPY (Pts 45-53yrs Insurance coverage will need to be confirmed)  07/14/2026   INFLUENZA VACCINE  Completed   DEXA SCAN  Completed   Hepatitis C Screening  Completed   HIV Screening  Completed   HPV VACCINES  Aged Out    Health Maintenance  Health Maintenance Due  Topic Date Due   DTaP/Tdap/Td (1 -  Tdap) Never done   PAP SMEAR-Modifier  Never done   Zoster Vaccines- Shingrix (1 of 2) Never done   COVID-19 Vaccine (4 - 2023-24 season) 08/28/2022   Pneumonia Vaccine 90+ Years old (2 - PCV) 09/14/2022    Colorectal cancer screening: Type of screening: Colonoscopy. Completed 07/14/16. Repeat every 10 years  Mammogram status: No longer required due to patient declining screening at this time.  Bone Density status: Completed 09/25/22. Results reflect: Bone density results: OSTEOPOROSIS. Repeat every 2 years.  Lung Cancer Screening: (Low Dose CT Chest recommended if Age 50-80 years, 30 pack-year currently smoking OR have quit w/in 15years.) does not qualify.   Lung Cancer Screening Referral: n/a   Additional Screening:  Hepatitis C Screening: does qualify; Completed 11/28/20  Vision Screening: Recommended annual ophthalmology exams for early detection of glaucoma and other disorders of the eye. Is the patient up to date with their annual eye exam?  Yes  Who is the provider or what is the name of the office in which the patient attends annual eye exams? Unable to provide doctor's name  If pt is not established with a provider, would they like to be referred to a  provider to establish care? No .   Dental Screening: Recommended annual dental exams for proper oral hygiene  Community Resource Referral / Chronic Care Management: CRR required this visit?  No   CCM required this visit?  No      Plan:     I have personally reviewed and noted the following in the patient's chart:   Medical and social history Use of alcohol, tobacco or illicit drugs  Current medications and supplements including opioid prescriptions. Patient is not currently taking opioid prescriptions. Functional ability and status Nutritional status Physical activity Advanced directives List of other physicians Hospitalizations, surgeries, and ER visits in previous 12 months Vitals Screenings to include  cognitive, depression, and falls Referrals and appointments  In addition, I have reviewed and discussed with patient certain preventive protocols, quality metrics, and best practice recommendations. A written personalized care plan for preventive services as well as general preventive health recommendations were provided to patient.    Medicare Attestation A preventative services visit was completed today.  During the course of the visit the patient was educated and counseled about appropriate screening and preventive services.  A health risk assessment was established with the patient that included a review of current medications, allergies, social history, family history, medical and preventative health history, biometrics, and preventative screenings to identify potential safety concerns or impairments.  A personalized plan was printed today for the patient's records and use.   Personalized health advice and education was given today to reduce health risks and promote self management and wellness.  Information regarding end of life planning was discussed today.  Dorothea Ogle, PA-C   01/08/2023   Due to this being a virtual visit, the after visit summary with patients personalized plan was offered to patient via mail or my-chart. Patient preferred to pick up at office at next visit   Nurse Notes: Patient would like to discuss problem with 2nd toe on left foot at upcoming appointment.

## 2022-12-29 NOTE — Patient Instructions (Addendum)
[name] , Thank you for taking time to come for your Medicare Wellness Visit. I appreciate your ongoing commitment to your health goals. Please review the following plan we discussed and let me know if I can assist you in the future.   These are the goals we discussed:  Goals      Patient Stated     12/05/2021, stay healthy        This is a list of the screening recommended for you and due dates:  Health Maintenance  Topic Date Due   DTaP/Tdap/Td vaccine (1 - Tdap) Never done   Pap Smear  Never done   Zoster (Shingles) Vaccine (1 of 2) Never done   COVID-19 Vaccine (4 - 2023-24 season) 08/28/2022   Pneumonia Vaccine (2 - PCV) 09/14/2022   Mammogram  12/30/2023*   Medicare Annual Wellness Visit  12/30/2023   Colon Cancer Screening  07/14/2026   Flu Shot  Completed   DEXA scan (bone density measurement)  Completed   Hepatitis C Screening: USPSTF Recommendation to screen - Ages 18-79 yo.  Completed   HIV Screening  Completed   HPV Vaccine  Aged Out  *Topic was postponed. The date shown is not the original due date.    Advanced directives: Advance directive discussed with you today. I have provided a copy for you to complete at home and have notarized. Once this is complete please bring a copy in to our office so we can scan it into your chart.   Conditions/risks identified: Aim for 30 minutes of exercise or brisk walking, 6-8 glasses of water, and 5 servings of fruits and vegetables each day.   Next appointment: Follow up in one year for your annual wellness visit    Preventive Care 65 Years and Older, Female Preventive care refers to lifestyle choices and visits with your health care provider that can promote health and wellness. What does preventive care include? A yearly physical exam. This is also called an annual well check. Dental exams once or twice a year. Routine eye exams. Ask your health care provider how often you should have your eyes checked. Personal  lifestyle choices, including: Daily care of your teeth and gums. Regular physical activity. Eating a healthy diet. Avoiding tobacco and drug use. Limiting alcohol use. Practicing safe sex. Taking low-dose aspirin every day. Taking vitamin and mineral supplements as recommended by your health care provider. What happens during an annual well check? The services and screenings done by your health care provider during your annual well check will depend on your age, overall health, lifestyle risk factors, and family history of disease. Counseling  Your health care provider may ask you questions about your: Alcohol use. Tobacco use. Drug use. Emotional well-being. Home and relationship well-being. Sexual activity. Eating habits. History of falls. Memory and ability to understand (cognition). Work and work Statistician. Reproductive health. Screening  You may have the following tests or measurements: Height, weight, and BMI. Blood pressure. Lipid and cholesterol levels. These may be checked every 5 years, or more frequently if you are over 64 years old. Skin check. Lung cancer screening. You may have this screening every year starting at age 33 if you have a 30-pack-year history of smoking and currently smoke or have quit within the past 15 years. Fecal occult blood test (FOBT) of the stool. You may have this test every year starting at age 26. Flexible sigmoidoscopy or colonoscopy. You may have a sigmoidoscopy every 5 years or a colonoscopy  every 10 years starting at age 13. Hepatitis C blood test. Hepatitis B blood test. Sexually transmitted disease (STD) testing. Diabetes screening. This is done by checking your blood sugar (glucose) after you have not eaten for a while (fasting). You may have this done every 1-3 years. Bone density scan. This is done to screen for osteoporosis. You may have this done starting at age 51. Mammogram. This may be done every 1-2 years. Talk to your  health care provider about how often you should have regular mammograms. Talk with your health care provider about your test results, treatment options, and if necessary, the need for more tests. Vaccines  Your health care provider may recommend certain vaccines, such as: Influenza vaccine. This is recommended every year. Tetanus, diphtheria, and acellular pertussis (Tdap, Td) vaccine. You may need a Td booster every 10 years. Zoster vaccine. You may need this after age 33. Pneumococcal 13-valent conjugate (PCV13) vaccine. One dose is recommended after age 21. Pneumococcal polysaccharide (PPSV23) vaccine. One dose is recommended after age 39. Talk to your health care provider about which screenings and vaccines you need and how often you need them. This information is not intended to replace advice given to you by your health care provider. Make sure you discuss any questions you have with your health care provider. Document Released: 01/10/2016 Document Revised: 09/02/2016 Document Reviewed: 10/15/2015 Elsevier Interactive Patient Education  2017 [place] Prevention in the Home Falls can cause injuries. They can happen to people of all ages. There are many things you can do to make your home safe and to help prevent falls. What can I do on the outside of my home? Regularly fix the edges of walkways and driveways and fix any cracks. Remove anything that might make you trip as you walk through a door, such as a raised step or threshold. Trim any bushes or trees on the path to your home. Use bright outdoor lighting. Clear any walking paths of anything that might make someone trip, such as rocks or tools. Regularly check to see if handrails are loose or broken. Make sure that both sides of any steps have handrails. Any raised decks and porches should have guardrails on the edges. Have any leaves, snow, or ice cleared regularly. Use sand or salt on walking paths during winter. Clean  up any spills in your garage right away. This includes oil or grease spills. What can I do in the bathroom? Use night lights. Install grab bars by the toilet and in the tub and shower. Do not use towel bars as grab bars. Use non-skid mats or decals in the tub or shower. If you need to sit down in the shower, use a plastic, non-slip stool. Keep the floor dry. Clean up any water that spills on the floor as soon as it happens. Remove soap buildup in the tub or shower regularly. Attach bath mats securely with double-sided non-slip rug tape. Do not have throw rugs and other things on the floor that can make you trip. What can I do in the bedroom? Use night lights. Make sure that you have a light by your bed that is easy to reach. Do not use any sheets or blankets that are too big for your bed. They should not hang down onto the floor. Have a firm chair that has side arms. You can use this for support while you get dressed. Do not have throw rugs and other things on the floor that can make  you trip. What can I do in the kitchen? Clean up any spills right away. Avoid walking on wet floors. Keep items that you use a lot in easy-to-reach places. If you need to reach something above you, use a strong step stool that has a grab bar. Keep electrical cords out of the way. Do not use floor polish or wax that makes floors slippery. If you must use wax, use non-skid floor wax. Do not have throw rugs and other things on the floor that can make you trip. What can I do with my stairs? Do not leave any items on the stairs. Make sure that there are handrails on both sides of the stairs and use them. Fix handrails that are broken or loose. Make sure that handrails are as long as the stairways. Check any carpeting to make sure that it is firmly attached to the stairs. Fix any carpet that is loose or worn. Avoid having throw rugs at the top or bottom of the stairs. If you do have throw rugs, attach them to the  floor with carpet tape. Make sure that you have a light switch at the top of the stairs and the bottom of the stairs. If you do not have them, ask someone to add them for you. What else can I do to help prevent falls? Wear shoes that: Do not have high heels. Have rubber bottoms. Are comfortable and fit you well. Are closed at the toe. Do not wear sandals. If you use a stepladder: Make sure that it is fully opened. Do not climb a closed stepladder. Make sure that both sides of the stepladder are locked into place. Ask someone to hold it for you, if possible. Clearly mark and make sure that you can see: Any grab bars or handrails. First and last steps. Where the edge of each step is. Use tools that help you move around (mobility aids) if they are needed. These include: Canes. Walkers. Scooters. Crutches. Turn on the lights when you go into a dark area. Replace any light bulbs as soon as they burn out. Set up your furniture so you have a clear path. Avoid moving your furniture around. If any of your floors are uneven, fix them. If there are any pets around you, be aware of where they are. Review your medicines with your doctor. Some medicines can make you feel dizzy. This can increase your chance of falling. Ask your doctor what other things that you can do to help prevent falls. This information is not intended to replace advice given to you by your health care provider. Make sure you discuss any questions you have with your health care provider. Document Released: 10/10/2009 Document Revised: 05/21/2016 Document Reviewed: 01/18/2015 Elsevier Interactive Patient Education  2017 [employer].

## 2023-01-15 ENCOUNTER — Ambulatory Visit (INDEPENDENT_AMBULATORY_CARE_PROVIDER_SITE_OTHER): Payer: Medicare HMO | Admitting: Medical

## 2023-01-15 ENCOUNTER — Encounter: Payer: Self-pay | Admitting: Medical

## 2023-01-15 VITALS — BP 120/76 | HR 68 | Ht 64.0 in | Wt 108.0 lb

## 2023-01-15 DIAGNOSIS — L989 Disorder of the skin and subcutaneous tissue, unspecified: Secondary | ICD-10-CM

## 2023-01-15 DIAGNOSIS — Z7185 Encounter for immunization safety counseling: Secondary | ICD-10-CM | POA: Diagnosis not present

## 2023-01-15 DIAGNOSIS — J849 Interstitial pulmonary disease, unspecified: Secondary | ICD-10-CM

## 2023-01-15 DIAGNOSIS — M810 Age-related osteoporosis without current pathological fracture: Secondary | ICD-10-CM | POA: Diagnosis not present

## 2023-01-15 DIAGNOSIS — R7989 Other specified abnormal findings of blood chemistry: Secondary | ICD-10-CM | POA: Diagnosis not present

## 2023-01-15 DIAGNOSIS — Z0000 Encounter for general adult medical examination without abnormal findings: Secondary | ICD-10-CM | POA: Diagnosis not present

## 2023-01-15 DIAGNOSIS — Z8719 Personal history of other diseases of the digestive system: Secondary | ICD-10-CM

## 2023-01-15 DIAGNOSIS — K219 Gastro-esophageal reflux disease without esophagitis: Secondary | ICD-10-CM

## 2023-01-15 DIAGNOSIS — N189 Chronic kidney disease, unspecified: Secondary | ICD-10-CM

## 2023-01-15 DIAGNOSIS — E2839 Other primary ovarian failure: Secondary | ICD-10-CM

## 2023-01-15 DIAGNOSIS — I2510 Atherosclerotic heart disease of native coronary artery without angina pectoris: Secondary | ICD-10-CM

## 2023-01-15 DIAGNOSIS — Z7952 Long term (current) use of systemic steroids: Secondary | ICD-10-CM | POA: Diagnosis not present

## 2023-01-15 DIAGNOSIS — I2583 Coronary atherosclerosis due to lipid rich plaque: Secondary | ICD-10-CM

## 2023-01-15 DIAGNOSIS — R6889 Other general symptoms and signs: Secondary | ICD-10-CM | POA: Diagnosis not present

## 2023-01-15 DIAGNOSIS — I7300 Raynaud's syndrome without gangrene: Secondary | ICD-10-CM

## 2023-01-15 DIAGNOSIS — B070 Plantar wart: Secondary | ICD-10-CM

## 2023-01-15 DIAGNOSIS — I10 Essential (primary) hypertension: Secondary | ICD-10-CM

## 2023-01-15 DIAGNOSIS — Z23 Encounter for immunization: Secondary | ICD-10-CM

## 2023-01-15 DIAGNOSIS — Z780 Asymptomatic menopausal state: Secondary | ICD-10-CM | POA: Diagnosis not present

## 2023-01-15 DIAGNOSIS — M898X9 Other specified disorders of bone, unspecified site: Secondary | ICD-10-CM

## 2023-01-15 DIAGNOSIS — N183 Chronic kidney disease, stage 3 unspecified: Secondary | ICD-10-CM

## 2023-01-15 DIAGNOSIS — R636 Underweight: Secondary | ICD-10-CM | POA: Diagnosis not present

## 2023-01-15 DIAGNOSIS — I351 Nonrheumatic aortic (valve) insufficiency: Secondary | ICD-10-CM

## 2023-01-15 DIAGNOSIS — I129 Hypertensive chronic kidney disease with stage 1 through stage 4 chronic kidney disease, or unspecified chronic kidney disease: Secondary | ICD-10-CM

## 2023-01-15 DIAGNOSIS — D631 Anemia in chronic kidney disease: Secondary | ICD-10-CM

## 2023-01-15 DIAGNOSIS — I7090 Unspecified atherosclerosis: Secondary | ICD-10-CM

## 2023-01-15 LAB — POCT URINALYSIS DIP (PROADVANTAGE DEVICE)
Bilirubin, UA: NEGATIVE
Blood, UA: NEGATIVE
Glucose, UA: NEGATIVE mg/dL
Ketones, POC UA: NEGATIVE mg/dL
Leukocytes, UA: NEGATIVE
Nitrite, UA: NEGATIVE
Protein Ur, POC: NEGATIVE mg/dL
Specific Gravity, Urine: 1.01
Urobilinogen, Ur: 0.2
pH, UA: 6 (ref 5.0–8.0)

## 2023-01-15 NOTE — Progress Notes (Signed)
Subjective:   HPI  [name] is a 67 y.o. female who presents for Chief Complaint  Patient presents with   Annual Exam    Fasting annual exam, had AWV 12/29/22. Has something on the bottom of her left foot, she thinks it may be a plantar wart-she would like you to look at. Also a mole on her scalp she would like for you to look at. Uses a nitro-bid cream for her fingers in the winter and needs and rx sent in.     Patient Care Team: Hadassah Rana, Camelia Eng, PA-C as PCP - General (Family Medicine) Lahoma Rocker, MD as Consulting Physician (Rheumatology) Collene Gobble, MD as Consulting Physician (Pulmonary Disease) Elmarie Shiley, MD as Consulting Physician (Nephrology) Sees dentist Sees eye doctor Dr. Ranelle Oyster, GI [facility], Dr. Ronnald Ramp, [place] Dim[place]   Concerns: Here for well visit.  She has concerns about plantar warts on her left foot  Has osteoporosis, has been offered Prolia or other treatment by [place] in rheumatology but she still has not decided to move forward with this  Raynaud's phenomenon, scleroderma-uses amlodipine, routine follow-up with rheumatology  Lung disease, asthma-compliant with Breo, albuterol  Reviewed their medical, surgical, family, social, medication, and allergy history and updated chart as appropriate.  Chronic kidney disease -compliant with ACE  Past Medical History:  Diagnosis Date   Allergic rhinitis    Asthma    Atherosclerosis    Chronic kidney disease (CKD), stage III (moderate) (HCC)    Esophageal stricture    GERD (gastroesophageal reflux disease)    History of anemia due to chronic kidney disease    History of colitis 2017   Hypertension    Metabolic bone disease    Raynaud disease    Scleroderma ([place])     Family History  Problem Relation Age of Onset   Heart failure Mother    Hepatitis Mother        C   Dementia Mother    COPD Mother        emphysema   Cancer Father        lung with mets to brain    Diabetes Sister    Hepatitis Brother    Cancer Sister        leukemia   Colon cancer Neg Hx      Current Outpatient Medications:    amLODipine (NORVASC) 5 MG tablet, Take 5 mg by mouth daily. , Disp: , Rfl:    BREO ELLIPTA 200-25 MCG/ACT AEPB, INHALE 1 PUFF INTO THE LUNGS DAILY., Disp: 180 each, Rfl: 0   Cholecalciferol (VITAMIN D3) 125 MCG (5000 UT) CAPS, Take 1 capsule (5,000 Units total) by mouth daily., Disp: 90 capsule, Rfl: 1   lisinopril (ZESTRIL) 5 MG tablet, Take 5 mg by mouth daily., Disp: , Rfl:    omeprazole (PRILOSEC) 20 MG capsule, Take 20 mg by mouth 2 (two) times daily before a meal., Disp: , Rfl:    acetaminophen (TYLENOL) 325 MG tablet, Take 325 mg by mouth every 6 (six) hours as needed for mild pain. (Patient not taking: Reported on 01/15/2023), Disp: , Rfl:    albuterol (PROVENTIL) (2.5 MG/3ML) 0.083% nebulizer solution, Take 3 mLs (2.5 mg total) by nebulization every 6 (six) hours as needed for wheezing or shortness of breath. (Patient not taking: Reported on 01/15/2023), Disp: 75 mL, Rfl: 3   albuterol (VENTOLIN HFA) 108 (90 Base) MCG/ACT inhaler, Inhale 2 puffs into the lungs every 6 (six) hours as needed  for wheezing or shortness of breath. (Patient not taking: Reported on 01/15/2023), Disp: 8 g, Rfl: 6   fluticasone (FLONASE) 50 MCG/ACT nasal spray, Place 1 spray into both nostrils daily. (Patient not taking: Reported on 01/15/2023), Disp: , Rfl:    pseudoephedrine (SUDAFED) 30 MG tablet, Take 30 mg by mouth every 6 (six) hours as needed for congestion. (Patient not taking: Reported on 01/15/2023), Disp: , Rfl:    tiZANidine (ZANAFLEX) 4 MG tablet, Take 0.5 tablets (2 mg total) by mouth at bedtime as needed for muscle spasms. (Patient not taking: Reported on 01/15/2023), Disp: 30 tablet, Rfl: 1  Allergies  Allergen Reactions   Avelox [Moxifloxacin Hcl In Nacl]     Caused tachycardia   Bactrim [Sulfamethoxazole-Trimethoprim]    Codeine Hives and Other (See Comments)     Hallucination,    Review of Systems  Constitutional:  Negative for chills, fever, malaise/fatigue and weight loss.  HENT:  Negative for congestion, ear pain, hearing loss, sore throat and tinnitus.   Eyes:  Negative for blurred vision, pain and redness.  Respiratory:  Negative for cough, hemoptysis and shortness of breath.   Cardiovascular:  Negative for chest pain, palpitations, orthopnea, claudication and leg swelling.  Gastrointestinal:  Negative for abdominal pain, blood in stool, constipation, diarrhea, nausea and vomiting.  Genitourinary:  Negative for dysuria, flank pain, frequency, hematuria and urgency.  Musculoskeletal:  Negative for falls, joint pain and myalgias.  Skin:  Positive for rash. Negative for itching.  Neurological:  Negative for dizziness, tingling, speech change, weakness and headaches.  Endo/Heme/Allergies:  Negative for polydipsia. Does not bruise/bleed easily.  Psychiatric/Behavioral:  Negative for depression and memory loss. The patient is not nervous/anxious and does not have insomnia.        01/15/2023    2:53 PM 12/29/2022    9:23 AM 02/12/2022    1:38 PM 12/11/2021    3:32 PM 12/05/2021    1:32 PM  Depression screen PHQ 2/9  Decreased Interest 0 0 0 0 0  Down, Depressed, Hopeless 0 0 0 0 0  PHQ - 2 Score 0 0 0 0 0       Objective:  BP 120/76   Pulse 68   Ht 5\' 4"  (1.626 m)   Wt 108 lb (49 kg)   BMI 18.54 kg/m   General appearance: alert, no distress, WD/WN, Caucasian female Skin: tight appearing skins of toes and fingers, left lateral sole of foot with plantar wart, thick tender corn of left foot of 2nd toe lateral toe, superior scalp within hair line with 6 mm diameter raised thicker flesh colored plaque HEENT: normocephalic, conjunctiva/corneas normal, sclerae anicteric, PERRLA, EOMi, nares patent, no discharge or erythema, pharynx normal Oral cavity: MMM, tongue normal, teeth in good repair Neck: supple, no lymphadenopathy, no thyromegaly, no  masses, normal ROM, no bruits Chest: non tender, normal shape and expansion Heart: RRR, normal S1, S2, no murmurs Lungs: CTA bilaterally, no wheezes, rhonchi, or rales Abdomen: +bs, soft, non tender, non distended, no masses, no hepatomegaly, no splenomegaly, no bruits Back: non tender, normal ROM, no scoliosis Musculoskeletal: upper extremities non tender, no obvious deformity, normal ROM throughout, lower extremities non tender, no obvious deformity, normal ROM throughout Extremities: no edema, more purplish coloration of fingers and toes, no clubbing Pulses: 2+ symmetric, upper and lower extremities, normal cap refill Neurological: alert, oriented x 3, CN2-12 intact, strength normal upper extremities and lower extremities, sensation normal throughout, DTRs 2+ throughout, no cerebellar signs, gait normal Psychiatric: normal  affect, behavior normal, pleasant  Breast/gyn/rectal - deferred to gynecology    Assessment and Plan :   Encounter Diagnoses  Name Primary?   Annual physical exam Yes   Encounter for health maintenance examination in adult    Estrogen deficiency    History of ischemic colitis    Long term systemic steroid user    Post-menopausal    Vaccine counseling    Underweight    Gastroesophageal reflux disease without esophagitis    Metabolic bone disease    Anemia of renal disease    CRI (chronic renal insufficiency), stage 3 (moderate) (HCC)    Hypertensive nephropathy    Abnormal thyroid blood test    Atherosclerosis    Essential hypertension    Raynaud's disease without gangrene    ILD (interstitial lung disease) (HCC)    Skin lesion    Plantar wart    Coronary artery disease due to lipid rich plaque    Nonrheumatic aortic valve insufficiency    Osteoporosis, unspecified osteoporosis type, unspecified pathological fracture presence    Need for pneumococcal 20-valent conjugate vaccination     This visit was a preventative care visit, also known as wellness  visit or routine physical.   Topics typically include healthy lifestyle, diet, exercise, preventative care, vaccinations, sick and well care, proper use of emergency dept and after hours care, as well as other concerns.     Recommendations: Continue to return yearly for your annual wellness and preventative care visits.  This gives [place] a chance to discuss healthy lifestyle, exercise, vaccinations, review your chart record, and perform screenings where appropriate.  I recommend you see your eye doctor yearly for routine vision care.  I recommend you see your dentist yearly for routine dental care including hygiene visits twice yearly.   Vaccination recommendations were reviewed Immunization History  Administered Date(s) Administered   Fluad Quad(high Dose 65+) 12/02/2020, 10/30/2022   H1N1 12/25/2008   Influenza Split 11/12/2015, 11/07/2016   Influenza Whole 09/27/2009   Influenza,inj,Quad PF,6+ Mos 10/30/2017, 10/29/2018, 10/27/2019   Influenza-Unspecified 12/28/2021   PFIZER(Purple Top)SARS-COV-2 Vaccination 04/05/2020, 05/03/2020, 09/05/2020   PNEUMOCOCCAL CONJUGATE-20 01/15/2023   Pneumococcal Polysaccharide-23 12/04/2011   Counseled on the pneumococcal vaccine.  Vaccine information sheet given.  Pneumococcal vaccine Prevnar 20 given after consent obtained.   Screening for cancer: Colon cancer screening: Due for colonoscopy.  She declines.  Offered Cologuard.  She will consider but declines this today as well.  She also declines repeat colonoscopy.   Breast cancer screening: You should perform a self breast exam monthly.   We reviewed recommendations for regular mammograms and breast cancer screening. Due for mammogram.  She plans to get her mammogram in the next few months  Cervical cancer screening: We reviewed recommendations for pap smear screening.  She is way past due for Pap smear.  She declines this today but has a gynecologist and she will consider going in for Pap  smear.   Skin cancer screening: Check your skin regularly for new changes, growing lesions, or other lesions of concern Come in for evaluation if you have skin lesions of concern.  Lung cancer screening: If you have a greater than 20 pack year history of tobacco use, then you may qualify for lung cancer screening with a chest CT scan.   Please call your insurance company to inquire about coverage for this test.  We currently don't have screenings for other cancers besides breast, cervical, colon, and lung cancers.  If you have a strong  family history of cancer or have other cancer screening concerns, please let me know.    Bone health: Get at least 150 minutes of aerobic exercise weekly Get weight bearing exercise at least once weekly Bone density test:  A bone density test is an imaging test that uses a type of X-ray to measure the amount of calcium and other minerals in your bones. The test may be used to diagnose or screen you for a condition that causes weak or thin bones (osteoporosis), predict your risk for a broken bone (fracture), or determine how well your osteoporosis treatment is working. The bone density test is recommended for females 65 and older, or females or males <65 if certain risk factors such as thyroid disease, long term use of steroids such as for asthma or rheumatological issues, vitamin D deficiency, estrogen deficiency, family history of osteoporosis, self or family history of fragility fracture in first degree relative.  I reviewed her bone density test from 2023 showing osteoporosis   Heart health: Get at least 150 minutes of aerobic exercise weekly Limit alcohol It is important to maintain a healthy blood pressure and healthy cholesterol numbers  Heart disease screening: Screening for heart disease includes screening for blood pressure, fasting lipids, glucose/diabetes screening, BMI height to weight ratio, reviewed of smoking status, physical activity, and  diet.    Goals include blood pressure 120/80 or less, maintaining a healthy lipid/cholesterol profile, preventing diabetes or keeping diabetes numbers under good control, not smoking or using tobacco products, exercising most days per week or at least 150 minutes per week of exercise, and eating healthy variety of fruits and vegetables, healthy oils, and avoiding unhealthy food choices like fried food, fast food, high sugar and high cholesterol foods.     CT chest on 06/2022 showing atherosclerosis of aorta and CAD.   Medical care options: I recommend you continue to seek care here first for routine care.  We try really hard to have available appointments Monday through Friday daytime hours for sick visits, acute visits, and physicals.  Urgent care should be used for after hours and weekends for significant issues that cannot wait till the next day.  The emergency department should be used for significant potentially life-threatening emergencies.  The emergency department is expensive, can often have long wait times for less significant concerns, so try to utilize primary care, urgent care, or telemedicine when possible to avoid unnecessary trips to the emergency department.  Virtual visits and telemedicine have been introduced since the pandemic started in 2020, and can be convenient ways to receive medical care.  We offer virtual appointments as well to assist you in a variety of options to seek medical care.   Advanced Directives: I recommend you consider completing a Health Care Power of Attorney and Living Will.   These documents respect your wishes and help alleviate burdens on your loved ones if you were to become terminally ill or be in a position to need those documents enforced.    You can complete Advanced Directives yourself, have them notarized, then have copies made for our office, for you and for anybody you feel should have them in safe keeping.  Or, you can have an attorney prepare  these documents.   If you haven't updated your Last Will and Testament in a while, it may be worthwhile having an attorney prepare these documents together and save on some costs.       Separate significant issues discussed: We discussed her CT scan  this past year that showed atherosclerosis of aorta and coronary artery disease.  We discussed the recommendation of statin and low-cholesterol diet.  She declines statin at this time.  Consider follow-up with cardiology  Raynaud's, scleroderma-managed by rheumatology  Chronic knee disease-I reviewed labs she had in November 2023 with rheumatology.  Kidney marker stable at that time.  Continue ACE, lisinopril, avoid NSAIDs, hydrate well daily  Hypertension-continue amlodipine and lisinopril for blood pressure, kidney protection and Raynaud's  History of anemia of renal disease but recent hemoglobin was okay per 10/2022 labs  Osteoporosis-we discussed the need for aerobic exercise, weightbearing exercise, medication to slow the rate of bone loss.  She declines medications for osteoporosis.  Both rheumatology and so is recommended medication such as Prolia or Fosamax but she declines.  Plantar wart, skin lesion of foot-advised podiatry consult.  She is going to use over-the-counter plantar wart solution for the time being  Skin lesion of scalp-advised Selsun Blue twice a week as discussed, referral to dermatology as well   [name] was seen today for annual exam.  Diagnoses and all orders for this visit:  Annual physical exam -     POCT Urinalysis DIP (Proadvantage Device) -     Lipid panel -     VITAMIN D 25 Hydroxy (Vit-D Deficiency, Fractures) -     TSH  Encounter for health maintenance examination in adult  Estrogen deficiency  History of ischemic colitis  Long term systemic steroid user  Post-menopausal  Vaccine counseling  Underweight  Gastroesophageal reflux disease without esophagitis  Metabolic bone disease  Anemia of  renal disease  CRI (chronic renal insufficiency), stage 3 (moderate) (HCC)  Hypertensive nephropathy  Abnormal thyroid blood test -     TSH  Atherosclerosis  Essential hypertension  Raynaud's disease without gangrene  ILD (interstitial lung disease) (HCC)  Skin lesion -     Ambulatory referral to Dermatology  Plantar wart -     Ambulatory referral to Dermatology  Coronary artery disease due to lipid rich plaque  Nonrheumatic aortic valve insufficiency  Osteoporosis, unspecified osteoporosis type, unspecified pathological fracture presence -     VITAMIN D 25 Hydroxy (Vit-D Deficiency, Fractures)  Need for pneumococcal 20-valent conjugate vaccination -     Pneumococcal conjugate vaccine 20-valent (Prevnar 20)   Follow-up pending labs, yearly for physical

## 2023-01-15 NOTE — Progress Notes (Signed)
Shingri  Chief Complaint  Patient presents with   Annual Exam    Fasting annual exam, had AWV 12/29/22. Has something on the bottom of her left foot, she thinks it may be a plantar wart-she would like you to look at. Also a mole on her scalp she would like for you to look at. Uses a nitro-bid cream for her fingers in the winter and needs and rx sent in.    P20 vax  Td, shingrix

## 2023-01-16 LAB — LIPID PANEL
Chol/HDL Ratio: 3 ratio (ref 0.0–4.4)
Cholesterol, Total: 159 mg/dL (ref 100–199)
HDL: 53 mg/dL (ref >39)
LDL Chol Calc (NIH): 93 mg/dL (ref 0–99)
Triglycerides: 64 mg/dL (ref 0–149)
VLDL Cholesterol Cal: 13 mg/dL (ref 5–40)

## 2023-01-16 LAB — TSH: TSH: 7.08 u[IU]/mL — ABNORMAL HIGH (ref 0.450–4.500)

## 2023-01-16 LAB — VITAMIN D 25 HYDROXY (VIT D DEFICIENCY, FRACTURES): Vit D, 25-Hydroxy: 24.1 ng/mL — ABNORMAL LOW (ref 30.0–100.0)

## 2023-01-18 ENCOUNTER — Other Ambulatory Visit: Payer: Self-pay | Admitting: Medical

## 2023-01-18 MED ORDER — LEVOTHYROXINE SODIUM 75 MCG PO TABS: 75.0000 ug | ORAL_TABLET | Freq: Every day | ORAL | 1 refills | Status: DC

## 2023-01-18 MED ORDER — FLUTICASONE FUROATE-VILANTEROL 200-25 MCG/ACT IN AEPB: 1.0000 | INHALATION_SPRAY | Freq: Every day | RESPIRATORY_TRACT | 3 refills | Status: DC

## 2023-01-18 MED ORDER — VITAMIN D3 125 MCG (5000 UT) PO CAPS: 5000.0000 [IU] | ORAL_CAPSULE | Freq: Every day | ORAL | 1 refills | Status: DC

## 2023-01-18 NOTE — Progress Notes (Signed)
Vitamin D level is low. I would like you to begin prescription Vitamin D 5000 IU daily.  We will plan to check vitamin D periodically along with calcium, such as repeating labs in 3 months.  Foods that contain vitamin D include seafood such as oysters, shrimp, salmon, herring, cod, egg yolks, mushrooms, milk, and foods fortified with Vitamin D such as orange juice, cereals.  Its also important to get some sun exposure regularly to absorb vitamin D.  Thyroid levels were abnormal.  I would like you to begin thyroid medication, levothyroxine 75 mcg.  This is supposed to be taken on an empty stomach first thing in the morning at least 45 minutes before any other medication.  Lets plan to recheck labs in 6 weeks  As we discussed I recommend a cholesterol medicine to lower your risk of heart disease and stroke.  Particular given the findings on the scan I would recommend this.  If agreeable let me know and I will send this to the pharmacy to take at bedtime.  Finally, consider treatment for osteoporosis in addition to weightbearing exercise and aerobic exercise and the vitamin D supplement.  I would recommend something like the injectables every 6 months or every month, 2 different options.  Let me know how you want to move forward

## 2023-01-19 ENCOUNTER — Other Ambulatory Visit: Payer: Self-pay | Admitting: Medical

## 2023-01-19 MED ORDER — ROSUVASTATIN CALCIUM 10 MG PO TABS: 10.0000 mg | ORAL_TABLET | Freq: Every day | ORAL | 3 refills | Status: DC

## 2023-01-19 NOTE — Progress Notes (Signed)
No I do not anticipate any issues with her kidneys with adding these medications only.  She may want to start with her thyroid medicine for the first 2 weeks before adding the Crestor cholesterol medicine in the event she feels like she has some type of side effect or issue.  I do not anticipate any problems with her thyroid medication.  Sometimes people have aches in the leg muscles with cholesterol medicine but that is not too terribly common.  On the other hand having high cholesterol, atherosclerosis, ultimately can lead to problems with blood flow particularly to the heart, but also to other organs such as the kidneys.  So in this case the goal is prevention which is why I recommend the cholesterol medicine.

## 2023-01-22 ENCOUNTER — Telehealth: Payer: Self-pay | Admitting: Medical

## 2023-01-22 NOTE — Telephone Encounter (Signed)
Pt called back and you were busy but she says a good time to call her is after 2 pm today.

## 2023-01-22 NOTE — Telephone Encounter (Signed)
Will call pt.

## 2023-02-01 ENCOUNTER — Other Ambulatory Visit: Payer: Medicare HMO

## 2023-02-11 ENCOUNTER — Ambulatory Visit
Admission: RE | Admit: 2023-02-11 | Discharge: 2023-02-11 | Disposition: A | Discharge status: Home / Self Care | Payer: Medicare HMO | Source: Ambulatory Visit | Attending: Pulmonary Disease | Admitting: Pulmonary Disease

## 2023-02-11 DIAGNOSIS — J849 Interstitial pulmonary disease, unspecified: Secondary | ICD-10-CM

## 2023-02-11 DIAGNOSIS — J479 Bronchiectasis, uncomplicated: Secondary | ICD-10-CM | POA: Diagnosis not present

## 2023-02-11 DIAGNOSIS — J8410 Pulmonary fibrosis, unspecified: Secondary | ICD-10-CM | POA: Diagnosis not present

## 2023-02-11 DIAGNOSIS — M349 Systemic sclerosis, unspecified: Secondary | ICD-10-CM | POA: Diagnosis not present

## 2023-02-15 ENCOUNTER — Ambulatory Visit: Payer: Medicare HMO | Admitting: Podiatry

## 2023-02-15 ENCOUNTER — Encounter: Payer: Self-pay | Admitting: Podiatry

## 2023-02-15 DIAGNOSIS — L84 Corns and callosities: Secondary | ICD-10-CM

## 2023-02-15 DIAGNOSIS — B070 Plantar wart: Secondary | ICD-10-CM | POA: Diagnosis not present

## 2023-02-15 DIAGNOSIS — M2041 Other hammer toe(s) (acquired), right foot: Secondary | ICD-10-CM | POA: Diagnosis not present

## 2023-02-15 DIAGNOSIS — M21619 Bunion of unspecified foot: Secondary | ICD-10-CM | POA: Diagnosis not present

## 2023-02-15 DIAGNOSIS — M2042 Other hammer toe(s) (acquired), left foot: Secondary | ICD-10-CM | POA: Diagnosis not present

## 2023-02-15 NOTE — Progress Notes (Signed)
Subjective:   Patient ID: [name], female   DOB: 67 y.o.   MRN: [id number]   HPI Patient presents with plantars warts in the ball of both feet and then lesions between the toes left which can become very sore with structural bunion deformity digital deformity.  Patient does not currently smoke likes to be active does have autoimmune disease kidney disease and history of asthma   Review of Systems  All other systems reviewed and are negative.       Objective:  Physical Exam Vitals and nursing note reviewed.  Constitutional:      Appearance: She is well-developed.  Pulmonary:     Effort: Pulmonary effort is normal.  Musculoskeletal:        General: Normal range of motion.  Skin:    General: Skin is warm.  Neurological:     Mental Status: She is alert.     Neurovascular status intact muscle strength was found to be adequate range of motion adequate with patient found to have significant corn between the second and third toes left and also is noted to have lesion subfifth metatarsal left and heel region bilateral with pinpoint bleeding noted.  Patient has good digital perfusion well-oriented x 3 and has large prominence first metatarsal head left over right     Assessment:  Significant structural deformities with structural bunion and hammertoe with lesions between his toes secondary to the pressure between the adjacent digits with probable verruca plantaris plantar aspect of both feet     Plan:  H&P reviewed all conditions did debridement of lesions plantarly applied small amount of medicine with sterile dressing instructed what to do if any blistering were to occur discussed over-the-counter acids which I like her to avoid discussed possible orthotics discussed the types of shoes that would probably be of best usage for her and strongly discourage bunion correction or anything along those lines due to her relative frail health at this time.  Patient will be seen back as  symptoms indicate which may dictate what we need to do  X-rays indicate that there is significant structural bunion deformity left over right and digital compression left over right foot

## 2023-03-05 ENCOUNTER — Telehealth: Payer: Self-pay | Admitting: Medical

## 2023-03-05 NOTE — Telephone Encounter (Signed)
Faxed received from [facility] requesting refill on Breo ellipta 200 MCG-25 MCG/DOSE

## 2023-03-06 ENCOUNTER — Other Ambulatory Visit: Payer: Self-pay | Admitting: Medical

## 2023-03-06 MED ORDER — FLUTICASONE FUROATE-VILANTEROL 200-25 MCG/ACT IN AEPB: 1.0000 | INHALATION_SPRAY | Freq: Every day | RESPIRATORY_TRACT | 1 refills | Status: DC

## 2023-03-08 ENCOUNTER — Telehealth: Payer: Self-pay

## 2023-03-08 NOTE — Telephone Encounter (Signed)
Received a call from [facility] and the lady states she sent a clearance over multiple times. She states the first time the order was through [facility] and the second time it was on the Echo report. She states she was then told to fax over the cardiac clearance in December and has not heard back. I asked her to give me the clearance information over the phone and she declined and stated she would contact the patient first then call us back tomorrow.

## 2023-04-16 ENCOUNTER — Encounter: Payer: Self-pay | Admitting: Adult Health

## 2023-04-16 ENCOUNTER — Ambulatory Visit: Payer: Medicare HMO | Admitting: Adult Health

## 2023-04-16 VITALS — BP 90/48 | HR 82 | Temp 98.2°F | Wt 109.0 lb

## 2023-04-16 DIAGNOSIS — J310 Chronic rhinitis: Secondary | ICD-10-CM

## 2023-04-16 DIAGNOSIS — J849 Interstitial pulmonary disease, unspecified: Secondary | ICD-10-CM | POA: Diagnosis not present

## 2023-04-16 DIAGNOSIS — N183 Chronic kidney disease, stage 3 unspecified: Secondary | ICD-10-CM

## 2023-04-16 DIAGNOSIS — J45901 Unspecified asthma with (acute) exacerbation: Secondary | ICD-10-CM | POA: Insufficient documentation

## 2023-04-16 DIAGNOSIS — J4531 Mild persistent asthma with (acute) exacerbation: Secondary | ICD-10-CM

## 2023-04-16 DIAGNOSIS — M349 Systemic sclerosis, unspecified: Secondary | ICD-10-CM

## 2023-04-16 MED ORDER — BENZONATATE 200 MG PO CAPS: 200.0000 mg | ORAL_CAPSULE | Freq: Three times a day (TID) | ORAL | 1 refills | Status: DC | PRN

## 2023-04-16 MED ORDER — AZITHROMYCIN 250 MG PO TABS: ORAL_TABLET | ORAL | 0 refills | Status: AC

## 2023-04-16 MED ORDER — PREDNISONE 20 MG PO TABS
20.0000 mg | ORAL_TABLET | Freq: Every day | ORAL | 0 refills | Status: DC
Start: 2023-04-16 — End: 2023-05-16

## 2023-04-16 MED ORDER — ALBUTEROL SULFATE HFA 108 (90 BASE) MCG/ACT IN AERS: 2.0000 | INHALATION_SPRAY | Freq: Four times a day (QID) | RESPIRATORY_TRACT | 3 refills | Status: DC | PRN

## 2023-04-16 NOTE — Assessment & Plan Note (Signed)
ILD-recent high-resolution CT chest showed stability.  She has upcoming PFTs.  Will treat for acute illness today.  Continue to follow closely  Plan  Patient Instructions  Zpack take as directed  Prednisone  daily for 5 days  Delsym 2 tsp Twice daily  for cough As needed  Tessalon Three times a day  As needed cough Continue on BREO 1 puff daily, rinse after use.  Albuterol inhaler or nebulizer  As needed   Discuss with Dr Allena Katz that Lisinopril may be aggravating your cough and check to see if Claritin is okay.  Follow up with Dr. Isaiah Serge in 3  weeks and As needed (as schedule for PFTs) Please contact office for sooner follow up if symptoms do not improve or worsen or seek emergency care

## 2023-04-16 NOTE — Assessment & Plan Note (Signed)
Continue follow-up with nephrology. 

## 2023-04-16 NOTE — Assessment & Plan Note (Signed)
Continue follow-up with rheumatology 

## 2023-04-16 NOTE — Progress Notes (Signed)
Error

## 2023-04-16 NOTE — Assessment & Plan Note (Signed)
Mild flare questionable early sinusitis.  Treat with empiric antibiotics Discussed adding Claritin however she wants to check with her nephrologist first  Plan  Patient Instructions  Zpack take as directed  Prednisone  daily for 5 days  Delsym 2 tsp Twice daily  for cough As needed  Tessalon Three times a day  As needed cough Continue on BREO 1 puff daily, rinse after use.  Albuterol inhaler or nebulizer  As needed   Discuss with Dr Allena Katz that Lisinopril may be aggravating your cough and check to see if Claritin is okay.  Follow up with Dr. Isaiah Serge in 3  weeks and As needed (as schedule for PFTs) Please contact office for sooner follow up if symptoms do not improve or worsen or seek emergency care

## 2023-04-16 NOTE — Progress Notes (Signed)
  ID: [name], female    DOB: 1956-02-17, 67 y.o.   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   CT chest 05/30/2020-resolution of groundglass, septal thickening with nodules in the bilateral upper lobes.   CTA 12/23/2020-no pulmonary embolism, resolution of multifocal lung nodules and groundglass in the upper lobe.  Subpleural reticulation scattered bronchiectasis.  High-resolution CT scan of the chest on 07/10/2022 shows areas of patchy groundglass, septal thickening in the interstitium, some architectural distortion with mild cylindrical bronchiectasis.  No overt honeycomb change.  The left upper lobe groundglass change is more prominent than prior imaging   05/15/2022 FEV1 1.72 [70%] FVC 1.88 [58%], F/F 91, TLC 3.90 [77%], DLCO 10.55 [50%] Restrictive lung disease, diffusion impairment.  2D echo August 2022 EF 60-65%, RV systolic function normal.  Normal pulmonary artery systolic pressure  04/16/2023 Acute OV : Cough  Patient presents for an acute office visit.  She complains over the last 10 days of cough, congestion, sinus drainage.  Had initial chills and low-grade fever.  She said her daughter had similar symptoms 2 weeks ago.  And then she started developing symptoms.  Complains of yellow mucus.  Has been using Robitussin cough syrup without much relief.  Says the cough is wearing her out.  She remains on Breo daily.  Has not used much of her albuterol.  She denies any hemoptysis chest pain orthopnea PND or leg swelling.  Appetite is good with no nausea vomiting or diarrhea.  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  She is followed for scleroderma and Raynaud's by rheumatology.  She also has chronic kidney disease followed by nephrology.  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  Not UIP.  Fibrosis appeared stable without evidence of progressive changes.  Patient has upcoming PFTs in 3 weeks.  Patient is on an ACE inhibitor.  Says that whenever she gets a cold seems like the cough last for a long time  Allergies  Allergen Reactions   Avelox [Moxifloxacin Hcl In Nacl]     Caused tachycardia   Bactrim [Sulfamethoxazole-Trimethoprim]    Codeine Hives and Other (See Comments)    Hallucination,     Immunization History  Administered Date(s) Administered   Fluad Quad(high Dose 65+) 12/02/2020, 10/30/2022   H1N1 12/25/2008   Influenza Split 11/12/2015, 11/07/2016   Influenza Whole 09/27/2009   Influenza,inj,Quad PF,6+ Mos 10/30/2017, 10/29/2018, 10/27/2019   Influenza-Unspecified 12/28/2021   PFIZER(Purple Top)SARS-COV-2 Vaccination 04/05/2020, 05/03/2020, 09/05/2020   PNEUMOCOCCAL CONJUGATE-20 01/15/2023   Pneumococcal Polysaccharide-23 12/04/2011    Past Medical History:  Diagnosis Date   Allergic rhinitis    Asthma    Atherosclerosis    Chronic kidney disease (CKD), stage III (moderate)    Esophageal stricture    GERD (gastroesophageal reflux disease)    History of anemia due to chronic kidney disease    History of colitis 2017   Hypertension    Metabolic bone disease    Raynaud disease    Scleroderma     Tobacco History: Social History  Tobacco Use  Smoking Status Never  Smokeless Tobacco Never   Counseling given: Not Answered   Outpatient Medications Prior to Visit  Medication Sig Dispense Refill   acetaminophen (TYLENOL) 325 MG tablet Take 325 mg by mouth every 6 (six) hours as needed for mild pain.      albuterol (PROVENTIL) (2.5 MG/3ML) 0.083% nebulizer solution Take 3 mLs (2.5 mg total) by nebulization every 6 (six) hours as needed for wheezing or shortness of breath. 75 mL 3   amLODipine (NORVASC) 5 MG tablet Take 5 mg by mouth daily.      Cholecalciferol (VITAMIN D3) 125 MCG (5000 UT) CAPS Take 1 capsule (5,000 Units total) by mouth daily. 90 capsule 1   diphenhydrAMINE (BENADRYL) 25 mg capsule Take 25 mg by mouth every 6 (six) hours as needed.     fluticasone (FLONASE) 50 MCG/ACT nasal spray Place 1 spray into both nostrils daily.     fluticasone furoate-vilanterol (BREO ELLIPTA) 200-25 MCG/ACT AEPB Inhale 1 puff into the lungs daily. 180 each 1   levothyroxine (SYNTHROID) 75 MCG tablet Take 1 tablet (75 mcg total) by mouth daily. 30 tablet 1   lisinopril (ZESTRIL) 5 MG tablet Take 5 mg by mouth daily.     omeprazole (PRILOSEC) 20 MG capsule Take 20 mg by mouth 2 (two) times daily before a meal.     pseudoephedrine (SUDAFED) 120 MG 12 hr tablet Take 120 mg by mouth 2 (two) times daily as needed for congestion.     albuterol (VENTOLIN HFA) 108 (90 Base) MCG/ACT inhaler Inhale 2 puffs into the lungs every 6 (six) hours as needed for wheezing or shortness of breath. 8 g 6   rosuvastatin (CRESTOR) 10 MG tablet Take 1 tablet (10 mg total) by mouth daily. (Patient not taking: Reported on 04/16/2023) 90 tablet 3   No facility-administered medications prior to visit.     Review of Systems:   Constitutional:   No  weight loss, night sweats,  Fevers, chills,  +fatigue, or  lassitude.  HEENT:   No headaches,  Difficulty swallowing,  Tooth/dental problems, or  Sore throat,                No sneezing, itching, ear ache, + nasal congestion, post nasal drip,   CV:  No chest pain,  Orthopnea, PND, swelling in lower extremities, anasarca, dizziness, palpitations, syncope.   GI  No heartburn, indigestion, abdominal pain, nausea, vomiting, diarrhea, change in bowel habits, loss of appetite, bloody  stools.   Resp: .  No chest wall deformity  Skin: no rash or lesions.  GU: no dysuria, change in color of urine, no urgency or frequency.  No flank pain, no hematuria   MS:  No joint pain or swelling.  No decreased range of motion.  No back pain.    Physical Exam  BP (!) 90/48   Pulse 82   Temp 98.2 F (36.8 C)   Wt 109 lb (49.4 kg)   SpO2 100%   BMI 18.71 kg/m   GEN: A/Ox3; pleasant , NAD, thin female   HEENT:  [place]/AT,   NOSE-clear drainage, THROAT-clear, no lesions, no postnasal drip or exudate noted.   NECK:  Supple w/ fair ROM; no JVD; normal carotid impulses w/o bruits; no thyromegaly or nodules palpated; no lymphadenopathy.    RESP bibasilar crackles no accessory muscle use, no dullness to percussion, speaks in full sentences with no apparent distress.  CARD:  RRR, no m/r/g, no peripheral edema, pulses intact,  no cyanosis or clubbing.  GI:   Soft & nt; nml bowel sounds; no organomegaly or masses detected.   Musco: Warm bil, no deformities or joint swelling noted.   Neuro: alert, no focal deficits noted.    Skin: Warm, no lesions or rashes    Lab Results:   BMET   BNP Imaging: No results found.       Latest Ref Rng & Units 05/15/2022    8:46 AM  PFT Results  FVC-Pre L 1.88   FVC-Predicted Pre % 58   FVC-Post L 1.86   FVC-Predicted Post % 58   Pre FEV1/FVC % % 91   Post FEV1/FCV % % 95   FEV1-Pre L 1.72   FEV1-Predicted Pre % 70   FEV1-Post L 1.76   DLCO uncorrected ml/min/mmHg 10.55   DLCO UNC% % 52   DLCO corrected ml/min/mmHg 10.55   DLCO COR %Predicted % 52   DLVA Predicted % 87   TLC L 3.90   TLC % Predicted % 77   RV % Predicted % 112     No results found for: "NITRICOXIDE"      Assessment & Plan:   Asthmatic bronchitis with exacerbation Acute exacerbation-we will treat with empiric products and short steroid burst. O2 saturations are normal at 100% on room air.  Patient is at risk for decompensation with underlying  connective tissue disorder and ILD.  Continue to follow closely.  Has follow-up in 3 weeks with PFTs. Continue on Breo.  Albuterol inhaler and nebulizer as needed May need to consider alternative to ACE inhibitor and is cough seems to last for long periods of time with respiratory illness.  Plan  Patient Instructions  Zpack take as directed  Prednisone  daily for 5 days  Delsym 2 tsp Twice daily  for cough As needed  Tessalon Three times a day  As needed cough Continue on BREO 1 puff daily, rinse after use.  Albuterol inhaler or nebulizer  As needed   Discuss with Dr Allena Katz that Lisinopril may be aggravating your cough and check to see if Claritin is okay.  Follow up with Dr. Isaiah Serge in 3  weeks and As needed (as schedule for PFTs) Please contact office for sooner follow up if symptoms do not improve or worsen or seek emergency care       Chronic rhinitis Mild flare questionable early sinusitis.  Treat with empiric antibiotics Discussed adding Claritin however she wants to check with her nephrologist first  Plan  Patient Instructions  Zpack take as directed  Prednisone  daily for 5 days  Delsym 2 tsp Twice daily  for cough As needed  Tessalon Three times a day  As needed cough Continue on BREO 1 puff daily, rinse after use.  Albuterol inhaler or nebulizer  As needed   Discuss with Dr Allena Katz that Lisinopril may be aggravating your cough and check to see if Claritin is okay.  Follow up with Dr. Isaiah Serge in 3  weeks and As needed (as schedule for PFTs) Please contact office for sooner follow up if symptoms do not improve or worsen or seek emergency care      ILD (interstitial lung disease) (HCC) ILD-recent high-resolution CT chest showed stability.  She has upcoming PFTs.  Will treat for acute illness today.  Continue to follow closely  Plan  Patient Instructions  Zpack take as directed  Prednisone  daily for 5 days  Delsym 2 tsp Twice daily  for cough As needed  Tessalon Three times a day  As needed cough Continue on BREO 1 puff daily, rinse after use.  Albuterol inhaler or nebulizer  As needed   Discuss with Dr Allena Katz that Lisinopril may be aggravating your cough and check to see if Claritin is okay.  Follow up with Dr. Isaiah Serge in 3  weeks and As needed (as schedule for PFTs) Please contact office for sooner follow up if symptoms do not improve or worsen or seek emergency care      CRI (chronic renal insufficiency), stage 3 (moderate) Continue follow-up with nephrology  Scleroderma [facility]) Continue follow-up with rheumatology     Rubye Oaks, NP 04/16/2023

## 2023-04-16 NOTE — Patient Instructions (Addendum)
Zpack take as directed  Prednisone  daily for 5 days  Delsym 2 tsp Twice daily  for cough As needed  Tessalon Three times a day  As needed cough Continue on BREO 1 puff daily, rinse after use.  Albuterol inhaler or nebulizer  As needed   Discuss with Dr Allena Katz that Lisinopril may be aggravating your cough and check to see if Claritin is okay.  Follow up with Dr. Isaiah Serge in 3  weeks and As needed (as schedule for PFTs) Please contact office for sooner follow up if symptoms do not improve or worsen or seek emergency care

## 2023-04-16 NOTE — Assessment & Plan Note (Addendum)
Acute exacerbation-we will treat with empiric products and short steroid burst. O2 saturations are normal at 100% on room air.  Patient is at risk for decompensation with underlying connective tissue disorder and ILD.  Continue to follow closely.  Has follow-up in 3 weeks with PFTs. Continue on Breo.  Albuterol inhaler and nebulizer as needed May need to consider alternative to ACE inhibitor and is cough seems to last for long periods of time with respiratory illness.  Plan  Patient Instructions  Zpack take as directed  Prednisone  daily for 5 days  Delsym 2 tsp Twice daily  for cough As needed  Tessalon Three times a day  As needed cough Continue on BREO 1 puff daily, rinse after use.  Albuterol inhaler or nebulizer  As needed   Discuss with Dr Allena Katz that Lisinopril may be aggravating your cough and check to see if Claritin is okay.  Follow up with Dr. Isaiah Serge in 3  weeks and As needed (as schedule for PFTs) Please contact office for sooner follow up if symptoms do not improve or worsen or seek emergency care

## 2023-04-22 DIAGNOSIS — L570 Actinic keratosis: Secondary | ICD-10-CM | POA: Diagnosis not present

## 2023-05-07 ENCOUNTER — Ambulatory Visit: Payer: Medicare HMO | Admitting: Pulmonary Disease

## 2023-05-07 ENCOUNTER — Ambulatory Visit (INDEPENDENT_AMBULATORY_CARE_PROVIDER_SITE_OTHER): Payer: Medicare HMO | Admitting: Emergency Medicine

## 2023-05-07 ENCOUNTER — Encounter: Payer: Self-pay | Admitting: Pulmonary Disease

## 2023-05-07 VITALS — BP 106/68 | HR 82 | Ht 64.0 in | Wt 108.2 lb

## 2023-05-07 DIAGNOSIS — J849 Interstitial pulmonary disease, unspecified: Secondary | ICD-10-CM | POA: Diagnosis not present

## 2023-05-07 LAB — PULMONARY FUNCTION TEST
DL/VA % pred: 87 %
DL/VA: 3.66 ml/min/mmHg/L
DLCO cor % pred: 68 %
DLCO cor: 13.66 ml/min/mmHg
DLCO unc % pred: 70 %
DLCO unc: 13.91 ml/min/mmHg
FEF 25-75 Post: 2.53 L/sec
FEF 25-75 Pre: 2.71 L/sec
FEF2575-%Change-Post: -6 %
FEF2575-%Pred-Post: 118 %
FEF2575-%Pred-Pre: 127 %
FEV1-%Change-Post: -3 %
FEV1-%Pred-Post: 68 %
FEV1-%Pred-Pre: 71 %
FEV1-Post: 1.66 L
FEV1-Pre: 1.72 L
FEV1FVC-%Change-Post: 1 %
FEV1FVC-%Pred-Pre: 124 %
FEV6-%Change-Post: -5 %
FEV6-%Pred-Post: 56 %
FEV6-%Pred-Pre: 59 %
FEV6-Post: 1.7 L
FEV6-Pre: 1.79 L
FEV6FVC-%Change-Post: 0 %
FEV6FVC-%Pred-Post: 103 %
FEV6FVC-%Pred-Pre: 104 %
FVC-%Change-Post: -4 %
FVC-%Pred-Post: 54 %
FVC-%Pred-Pre: 56 %
FVC-Post: 1.71 L
FVC-Pre: 1.79 L
Post FEV1/FVC ratio: 97 %
Post FEV6/FVC ratio: 100 %
Pre FEV1/FVC ratio: 96 %
Pre FEV6/FVC Ratio: 100 %
RV % pred: 119 %
RV: 2.49 L
TLC % pred: 75 %
TLC: 3.81 L

## 2023-05-07 NOTE — Addendum Note (Signed)
Addended by: Bradd Canary L on: 05/07/2023 03:42 PM   Modules accepted: Orders

## 2023-05-07 NOTE — Progress Notes (Signed)
[name]    [id number]    January 11, 1956  Primary Care Physician:Tysinger, Kermit Balo, PA-C  Referring Physician: Jac Canavan, PA-C [address] [place],  [place] [address]  Chief complaint: Follow-up for for interstitial lung disease  HPI: 67 y.o. who  has a past medical history of Allergic rhinitis, Asthma, Atherosclerosis, Chronic kidney disease (CKD), stage III (moderate) (HCC), Esophageal stricture, GERD (gastroesophageal reflux disease), History of anemia due to chronic kidney disease, History of colitis (2017), Hypertension, Metabolic bone disease, Raynaud disease, and Scleroderma (HCC).   History of scleroderma, Raynaud's disease diagnosed with skin biopsy in 2004.  She was on methotrexate from 2000 03/29/2004 and currently not on treatment.  Follows with Dr. Deanne Coffer from rheumatology  History also significant for asthma, esophageal dysmotility and stricture.  She was previously followed by Dr. Marina Goodell and had esophageal dilatation in the past.  Currently on Prilosec and she is managing with elevation of the head of the bed.  Followed with Dr. Delton Coombes.  She has been referred for evaluation of abnormal CT  Pets: Dogs, [place] pigs Occupation: Used to work in [employer] in her history.  Currently on disability Exposures: No mold, hot tub, Jacuzzi.  No feather pillows or comforters Smoking history: Never smoker Travel history: No significant travel history Relevant family history: No family history of lung disease  Interim history: Seen in pulm clinic in April 2024 for asthmatic bronchitis just treated with Z-Pak and prednisone.  She is feeling better and back to baseline  Here for review of PFTs.  Outpatient Encounter Medications as of 05/07/2023  Medication Sig   acetaminophen (TYLENOL) 325 MG tablet Take 325 mg by mouth every 6 (six) hours as needed for mild pain.   albuterol (PROVENTIL) (2.5 MG/3ML) 0.083% nebulizer solution Take 3 mLs (2.5 mg total) by  nebulization every 6 (six) hours as needed for wheezing or shortness of breath.   albuterol (VENTOLIN HFA) 108 (90 Base) MCG/ACT inhaler Inhale 2 puffs into the lungs every 6 (six) hours as needed for wheezing or shortness of breath.   amLODipine (NORVASC) 5 MG tablet Take 5 mg by mouth daily.    Cholecalciferol (VITAMIN D3) 125 MCG (5000 UT) CAPS Take 1 capsule (5,000 Units total) by mouth daily.   diphenhydrAMINE (BENADRYL) 25 mg capsule Take 25 mg by mouth every 6 (six) hours as needed.   fluticasone (FLONASE) 50 MCG/ACT nasal spray Place 1 spray into both nostrils daily.   fluticasone furoate-vilanterol (BREO ELLIPTA) 200-25 MCG/ACT AEPB Inhale 1 puff into the lungs daily.   levothyroxine (SYNTHROID) 75 MCG tablet Take 1 tablet (75 mcg total) by mouth daily.   lisinopril (ZESTRIL) 5 MG tablet Take 5 mg by mouth daily.   omeprazole (PRILOSEC) 20 MG capsule Take 20 mg by mouth 2 (two) times daily before a meal.   pseudoephedrine (SUDAFED) 120 MG 12 hr tablet Take 120 mg by mouth 2 (two) times daily as needed for congestion.   rosuvastatin (CRESTOR) 10 MG tablet Take 1 tablet (10 mg total) by mouth daily.   benzonatate (TESSALON) 200 MG capsule Take 1 capsule (200 mg total) by mouth 3 (three) times daily as needed. (Patient not taking: Reported on 05/07/2023)   predniSONE (DELTASONE) 20 MG tablet Take 1 tablet (20 mg total) by mouth daily with breakfast. (Patient not taking: Reported on 05/07/2023)   [DISCONTINUED] famotidine (PEPCID) 20 MG tablet Take 20 mg by mouth 2 (two) times daily.  No facility-administered encounter medications on file as of 05/07/2023.   Physical Exam: Blood pressure 106/68, pulse 82, height 5\' 4"  (1.626 m), weight 108 lb 3.2 oz (49.1 kg), SpO2 100 %. Gen:      No acute distress HEENT:  EOMI, sclera anicteric Neck:     No masses; no thyromegaly Lungs:    Clear to auscultation bilaterally; normal respiratory effort CV:         Regular rate and rhythm; no murmurs Abd:       + bowel sounds; soft, non-tender; no palpable masses, no distension Ext:    No edema; adequate peripheral perfusion Skin:      Warm and dry; no rash Neuro: alert and oriented x 3 Psych: normal mood and affect   Data Reviewed: Imaging: CT chest 12/05/2019-increasing groundglass opacities in the upper lobes.  CT chest 05/30/2020-resolution of groundglass, septal thickening with nodules in the bilateral upper lobes.  CTA 12/23/2020-no pulmonary embolism, resolution of multifocal lung nodules and groundglass in the upper lobe.  Subpleural reticulation scattered bronchiectasis.  CT high-resolution 07/10/2022-patchy areas of groundglass attenuation and septal thickening with few areas of mild cylindrical bronchiectasis and peripheral bronchiolectasis with no gradient.  Alternate diagnosis.  Patulous fluid-filled esophagus, coronary and aortic atherosclerosis.  CT high-resolution 02/11/2023-lower lung predominant fibrotic interstitial lung disease which is stable.  Resolution of left upper lobe groundglass opacities.  Dilated esophagus with layering debris's.  I have reviewed the images personally.  PFTs: 05/15/2022 FEV1 1.72 [70%] FVC 1.88 [58%], F/F 91, TLC 3.90 [77%], DLCO 10.55 [50%] Restrictive lung disease, diffusion impairment.  05/07/2023 FVC 1.71 [54%], FEV1 1.66 [68%], F/F97, TLC 3.81 [95%], DLCO 13.91 [70%] Moderate diffusion defect, mild diffusion impairment  Labs:   Assessment:  Evaluation for interstitial lung disease She has longstanding history of scleroderma and Raynaud's syndrome.  Her CT scan on review shows waxing and waning groundglass opacities in the upper lobes and mild reticulation.  This may be secondary to interstitial lung disease secondary to scleroderma but I am impressed with the size of her esophagus and presence of debris's and a fluid level.  Given waxing and waning nature of her groundglass opacities I suspect chronic recurrent aspiration.  She does have  history of esophageal stricture, esophageal dysfunction and has not followed up with Dr. Marina Goodell from gastroenterology.  I will have her reevaluated with referral to GI Continue PPI  Order high-resolution CT and PFTs follow-up in 3 months.  Plan/Recommendations: High-resolution CT Referral to GI, Dr. Wallace Cullens MD [facility] 05/07/2023, 3:09 PM  CC: Jac Canavan, PA-C

## 2023-05-07 NOTE — Patient Instructions (Addendum)
I am glad you are feeling better Will make a referral to Dr. Marina Goodell from [facility] for evaluation of esophageal dysfunction Follow-up high-resolution CT in 9 months Return to clinic after CT scan

## 2023-05-07 NOTE — Progress Notes (Signed)
Full PFT performed today. °

## 2023-05-07 NOTE — Patient Instructions (Signed)
Full PFT performed today. °

## 2023-05-14 ENCOUNTER — Other Ambulatory Visit: Payer: Self-pay | Admitting: Emergency Medicine

## 2023-05-14 MED ORDER — ALBUTEROL SULFATE (2.5 MG/3ML) 0.083% IN NEBU: 2.5000 mg | INHALATION_SOLUTION | Freq: Four times a day (QID) | RESPIRATORY_TRACT | 3 refills | Status: AC | PRN

## 2023-05-14 NOTE — Telephone Encounter (Signed)
Spoke to pt and she stated he was seen on Friday 05/07/23 and when she left out office she had a dry hacky cough no sob and she has been using albuterol (PROVENTIL) (2.5 MG/3ML) 0.083% nebulizer solution but the medicine is expired. And wanted something called in to the pharmacy. I informed pt I will send a message to the provider and give her a call back. Pt verbalized understanding.

## 2023-05-14 NOTE — Telephone Encounter (Signed)
PT calling saying all bronchitis symptoms are  back again. She wonders if Tammy can call in a round of meds for her.  Also, she has some expired nebulizer solution (6 mo exp.) and wonders if we can refill this as well because it seems to be helping her. Her # is [phone]   Pharm: [facility] in [place]

## 2023-05-16 ENCOUNTER — Emergency Department (HOSPITAL_COMMUNITY): Payer: Medicare HMO

## 2023-05-16 ENCOUNTER — Observation Stay (HOSPITAL_COMMUNITY)
Admission: EM | Admit: 2023-05-16 | Discharge: 2023-05-17 | Disposition: A | Discharge status: Home / Self Care | Payer: Medicare HMO | Attending: Family Medicine | Admitting: Family Medicine

## 2023-05-16 ENCOUNTER — Other Ambulatory Visit: Payer: Self-pay

## 2023-05-16 DIAGNOSIS — J849 Interstitial pulmonary disease, unspecified: Secondary | ICD-10-CM | POA: Diagnosis not present

## 2023-05-16 DIAGNOSIS — Z23 Encounter for immunization: Secondary | ICD-10-CM | POA: Insufficient documentation

## 2023-05-16 DIAGNOSIS — J45909 Unspecified asthma, uncomplicated: Secondary | ICD-10-CM | POA: Insufficient documentation

## 2023-05-16 DIAGNOSIS — I129 Hypertensive chronic kidney disease with stage 1 through stage 4 chronic kidney disease, or unspecified chronic kidney disease: Secondary | ICD-10-CM | POA: Insufficient documentation

## 2023-05-16 DIAGNOSIS — J189 Pneumonia, unspecified organism: Secondary | ICD-10-CM | POA: Diagnosis present

## 2023-05-16 DIAGNOSIS — E039 Hypothyroidism, unspecified: Secondary | ICD-10-CM | POA: Diagnosis not present

## 2023-05-16 DIAGNOSIS — Z79899 Other long term (current) drug therapy: Secondary | ICD-10-CM | POA: Insufficient documentation

## 2023-05-16 DIAGNOSIS — R55 Syncope and collapse: Secondary | ICD-10-CM | POA: Diagnosis not present

## 2023-05-16 DIAGNOSIS — A419 Sepsis, unspecified organism: Secondary | ICD-10-CM | POA: Diagnosis not present

## 2023-05-16 DIAGNOSIS — R0602 Shortness of breath: Secondary | ICD-10-CM | POA: Diagnosis present

## 2023-05-16 DIAGNOSIS — Z1152 Encounter for screening for COVID-19: Secondary | ICD-10-CM | POA: Diagnosis not present

## 2023-05-16 DIAGNOSIS — N183 Chronic kidney disease, stage 3 unspecified: Secondary | ICD-10-CM | POA: Insufficient documentation

## 2023-05-16 LAB — DIFFERENTIAL
Abs Immature Granulocytes: 0 10*3/uL (ref 0.00–0.07)
Band Neutrophils: 0 %
Basophils Absolute: 0 10*3/uL (ref 0.0–0.1)
Basophils Relative: 0 %
Blasts: 0 %
Eosinophils Absolute: 0.2 10*3/uL (ref 0.0–0.5)
Eosinophils Relative: 1 %
Lymphocytes Relative: 7 %
Lymphs Abs: 1.5 10*3/uL (ref 0.7–4.0)
Metamyelocytes Relative: 0 %
Monocytes Absolute: 1.9 10*3/uL — ABNORMAL HIGH (ref 0.1–1.0)
Monocytes Relative: 9 %
Myelocytes: 0 %
Neutro Abs: 17.7 10*3/uL — ABNORMAL HIGH (ref 1.7–7.7)
Neutrophils Relative %: 83 %
Other: 0 %
Promyelocytes Relative: 0 %
nRBC: 0 /100 WBC

## 2023-05-16 LAB — URINALYSIS, ROUTINE W REFLEX MICROSCOPIC
Bacteria, UA: NONE SEEN
Bilirubin Urine: NEGATIVE
Glucose, UA: NEGATIVE mg/dL
Ketones, ur: NEGATIVE mg/dL
Leukocytes,Ua: NEGATIVE
Nitrite: NEGATIVE
Protein, ur: NEGATIVE mg/dL
Specific Gravity, Urine: 1.004 — ABNORMAL LOW (ref 1.005–1.030)
pH: 7 (ref 5.0–8.0)

## 2023-05-16 LAB — CBC
HCT: 34.8 % — ABNORMAL LOW (ref 36.0–46.0)
HCT: 28.9 % — ABNORMAL LOW (ref 36.0–46.0)
Hemoglobin: 11.1 g/dL — ABNORMAL LOW (ref 12.0–15.0)
Hemoglobin: 9.2 g/dL — ABNORMAL LOW (ref 12.0–15.0)
MCH: 29.8 pg (ref 26.0–34.0)
MCH: 30.2 pg (ref 26.0–34.0)
MCHC: 31.9 g/dL (ref 30.0–36.0)
MCHC: 31.8 g/dL (ref 30.0–36.0)
MCV: 93.3 fL (ref 80.0–100.0)
MCV: 94.8 fL (ref 80.0–100.0)
Platelets: 382 10*3/uL (ref 150–400)
Platelets: 295 10*3/uL (ref 150–400)
RBC: 3.73 MIL/uL — ABNORMAL LOW (ref 3.87–5.11)
RBC: 3.05 MIL/uL — ABNORMAL LOW (ref 3.87–5.11)
RDW: 12.8 % (ref 11.5–15.5)
RDW: 12.9 % (ref 11.5–15.5)
WBC: 21.3 10*3/uL — ABNORMAL HIGH (ref 4.0–10.5)
WBC: 19.8 10*3/uL — ABNORMAL HIGH (ref 4.0–10.5)
nRBC: 0 % (ref 0.0–0.2)
nRBC: 0 % (ref 0.0–0.2)

## 2023-05-16 LAB — CREATININE, SERUM
Creatinine, Ser: 1.51 mg/dL — ABNORMAL HIGH (ref 0.44–1.00)
GFR, Estimated: 38 mL/min — ABNORMAL LOW (ref >60)

## 2023-05-16 LAB — BASIC METABOLIC PANEL
Anion gap: 12 (ref 5–15)
BUN: 24 mg/dL — ABNORMAL HIGH (ref 8–23)
CO2: 22 mmol/L (ref 22–32)
Calcium: 8.6 mg/dL — ABNORMAL LOW (ref 8.9–10.3)
Chloride: 98 mmol/L (ref 98–111)
Creatinine, Ser: 1.57 mg/dL — ABNORMAL HIGH (ref 0.44–1.00)
GFR, Estimated: 36 mL/min — ABNORMAL LOW (ref >60)
Glucose, Bld: 116 mg/dL — ABNORMAL HIGH (ref 70–99)
Potassium: 3.8 mmol/L (ref 3.5–5.1)
Sodium: 132 mmol/L — ABNORMAL LOW (ref 135–145)

## 2023-05-16 LAB — RESPIRATORY PANEL BY PCR

## 2023-05-16 LAB — TROPONIN I (HIGH SENSITIVITY)
Troponin I (High Sensitivity): 8 ng/L (ref <18)
Troponin I (High Sensitivity): 9 ng/L (ref <18)

## 2023-05-16 LAB — HIV ANTIBODY (ROUTINE TESTING W REFLEX): HIV Screen 4th Generation wRfx: NONREACTIVE

## 2023-05-16 LAB — STREP PNEUMONIAE URINARY ANTIGEN: Strep Pneumo Urinary Antigen: NEGATIVE

## 2023-05-16 LAB — CBG MONITORING, ED: Glucose-Capillary: 120 mg/dL — ABNORMAL HIGH (ref 70–99)

## 2023-05-16 LAB — SARS CORONAVIRUS 2 BY RT PCR: SARS Coronavirus 2 by RT PCR: NEGATIVE

## 2023-05-16 MED ORDER — FLUTICASONE PROPIONATE 50 MCG/ACT NA SUSP
1.0000 | Freq: Every day | NASAL | Status: DC
  Administered 2023-05-17: 1 via NASAL
  Filled 2023-05-16: qty 16

## 2023-05-16 MED ORDER — LACTATED RINGERS IV SOLN: INTRAVENOUS | Status: DC

## 2023-05-16 MED ORDER — SODIUM CHLORIDE 0.9 % IV SOLN
2.0000 g | Freq: Once | INTRAVENOUS | Status: AC
  Administered 2023-05-16: 2 g via INTRAVENOUS
  Filled 2023-05-16: qty 20

## 2023-05-16 MED ORDER — PSEUDOEPHEDRINE HCL ER 120 MG PO TB12: 120.0000 mg | ORAL_TABLET | Freq: Two times a day (BID) | ORAL | Status: DC | PRN

## 2023-05-16 MED ORDER — ENOXAPARIN SODIUM 30 MG/0.3ML IJ SOSY
30.0000 mg | PREFILLED_SYRINGE | INTRAMUSCULAR | Status: DC
  Administered 2023-05-16 – 2023-05-17 (×2): 30 mg via SUBCUTANEOUS
  Filled 2023-05-16 (×2): qty 0.3

## 2023-05-16 MED ORDER — LEVOTHYROXINE SODIUM 75 MCG PO TABS
75.0000 ug | ORAL_TABLET | Freq: Every day | ORAL | Status: DC
  Administered 2023-05-17: 75 ug via ORAL
  Filled 2023-05-16 (×2): qty 1

## 2023-05-16 MED ORDER — ACETAMINOPHEN 325 MG PO TABS
650.0000 mg | ORAL_TABLET | Freq: Four times a day (QID) | ORAL | Status: DC | PRN
  Administered 2023-05-16: 650 mg via ORAL
  Filled 2023-05-16: qty 2

## 2023-05-16 MED ORDER — SODIUM CHLORIDE 0.9 % IV SOLN
2.0000 g | INTRAVENOUS | Status: DC
  Administered 2023-05-17: 2 g via INTRAVENOUS
  Filled 2023-05-16: qty 20

## 2023-05-16 MED ORDER — SODIUM CHLORIDE 0.9 % IV SOLN: 500.0000 mg | INTRAVENOUS | Status: DC

## 2023-05-16 MED ORDER — TETANUS-DIPHTH-ACELL PERTUSSIS 5-2.5-18.5 LF-MCG/0.5 IM SUSY
0.5000 mL | PREFILLED_SYRINGE | Freq: Once | INTRAMUSCULAR | Status: AC
  Administered 2023-05-16: 0.5 mL via INTRAMUSCULAR
  Filled 2023-05-16: qty 0.5

## 2023-05-16 MED ORDER — FLUTICASONE FUROATE-VILANTEROL 200-25 MCG/ACT IN AEPB
1.0000 | INHALATION_SPRAY | Freq: Every day | RESPIRATORY_TRACT | Status: DC
  Administered 2023-05-17: 1 via RESPIRATORY_TRACT
  Filled 2023-05-16: qty 28

## 2023-05-16 MED ORDER — PREDNISONE 20 MG PO TABS
40.0000 mg | ORAL_TABLET | Freq: Every day | ORAL | Status: DC
  Administered 2023-05-16 – 2023-05-17 (×2): 40 mg via ORAL
  Filled 2023-05-16 (×2): qty 2

## 2023-05-16 MED ORDER — VITAMIN D3 125 MCG (5000 UT) PO CAPS: 5000.0000 [IU] | ORAL_CAPSULE | Freq: Every day | ORAL | Status: DC

## 2023-05-16 MED ORDER — ALBUTEROL SULFATE (2.5 MG/3ML) 0.083% IN NEBU: 2.5000 mg | INHALATION_SOLUTION | RESPIRATORY_TRACT | Status: DC | PRN

## 2023-05-16 MED ORDER — DIPHENHYDRAMINE HCL 25 MG PO CAPS: 25.0000 mg | ORAL_CAPSULE | Freq: Every evening | ORAL | Status: DC | PRN

## 2023-05-16 MED ORDER — PANTOPRAZOLE SODIUM 40 MG PO TBEC
40.0000 mg | DELAYED_RELEASE_TABLET | Freq: Every day | ORAL | Status: DC
  Administered 2023-05-16 – 2023-05-17 (×2): 40 mg via ORAL
  Filled 2023-05-16 (×2): qty 1

## 2023-05-16 MED ORDER — ACETAMINOPHEN 650 MG RE SUPP: 650.0000 mg | Freq: Four times a day (QID) | RECTAL | Status: DC | PRN

## 2023-05-16 MED ORDER — AMLODIPINE BESYLATE 5 MG PO TABS
5.0000 mg | ORAL_TABLET | Freq: Every day | ORAL | Status: DC
  Administered 2023-05-17: 5 mg via ORAL
  Filled 2023-05-16: qty 1

## 2023-05-16 MED ORDER — SODIUM CHLORIDE 0.9 % IV SOLN
500.0000 mg | Freq: Once | INTRAVENOUS | Status: AC
  Administered 2023-05-16: 500 mg via INTRAVENOUS
  Filled 2023-05-16: qty 5

## 2023-05-16 MED ORDER — POLYETHYLENE GLYCOL 3350 17 G PO PACK
17.0000 g | PACK | Freq: Every day | ORAL | Status: DC
  Filled 2023-05-16 (×2): qty 1

## 2023-05-16 MED ORDER — SODIUM CHLORIDE 0.9 % IV BOLUS
1000.0000 mL | Freq: Once | INTRAVENOUS | Status: AC
  Administered 2023-05-16: 1000 mL via INTRAVENOUS

## 2023-05-16 MED ORDER — VITAMIN D 25 MCG (1000 UNIT) PO TABS
5000.0000 [IU] | ORAL_TABLET | Freq: Every day | ORAL | Status: DC
  Administered 2023-05-17: 5000 [IU] via ORAL
  Filled 2023-05-16: qty 5

## 2023-05-16 NOTE — ED Triage Notes (Signed)
Pt arrived via POV. C/o syncopal episode early this AM when getting off the toilet. Pt woke up on ground, no c/o head trauma, but has scrape on buttocks.  Pt reports productive cough for several days.  [employer], independently mobile

## 2023-05-16 NOTE — ED Notes (Signed)
ED TO INPATIENT HANDOFF REPORT  ED Nurse Name and Phone #: Dessa Phi  S Name/Age/Gender [name] 67 y.o. female Room/Bed: WA02/WA02  Code Status   Code Status: Prior  Home/SNF/Other Home Patient oriented to: self, place, time, and situation Is this baseline? Yes   Triage Complete: Triage complete  Chief Complaint Pneumonia [J18.9]  Triage Note Pt arrived via POV. C/o syncopal episode early this AM when getting off the toilet. Pt woke up on ground, no c/o head trauma, but has scrape on buttocks.  Pt reports productive cough for several days.  Aox4, independently mobile   Allergies Allergies  Allergen Reactions   Avelox [Moxifloxacin Hcl In Nacl]     Caused tachycardia   Bactrim [Sulfamethoxazole-Trimethoprim]    Codeine Hives and Other (See Comments)    Hallucination,     Level of Care/Admitting Diagnosis ED Disposition     ED Disposition  Admit   Condition  --   Comment  Hospital Area: [facility] [[id number]]  Level of Care: Med-Surg [16]  May place patient in observation at [facility] or [facility] if equivalent level of care is available:: No  Covid Evaluation: Asymptomatic - no recent exposure (last 10 days) testing not required  Diagnosis: Pneumonia [227785]  Admitting Physician: Zigmund Daniel [phone]  Attending Physician: Shaune Spittle, Vanice Sarah [phone]          B Medical/Surgery History Past Medical History:  Diagnosis Date   Allergic rhinitis    Asthma    Atherosclerosis    Chronic kidney disease (CKD), stage III (moderate) (HCC)    Esophageal stricture    GERD (gastroesophageal reflux disease)    History of anemia due to chronic kidney disease    History of colitis 2017   Hypertension    Metabolic bone disease    Raynaud disease    Scleroderma (HCC)    Past Surgical History:  Procedure Laterality Date   CESAREAN SECTION     COLONOSCOPY WITH PROPOFOL N/A 07/14/2016   Procedure: COLONOSCOPY  WITH PROPOFOL;  Surgeon: Rachael Fee, MD;  Location: [facility];  Service: Endoscopy;  Laterality: N/A;   HAND SURGERY     left; tendon repair   TONSILLECTOMY       A IV Location/Drains/Wounds Patient Lines/Drains/Airways Status     Active Line/Drains/Airways     Name Placement date Placement time Site Days   Peripheral IV 05/16/23 22 G Anterior;Distal;Left Forearm 05/16/23  0759  Forearm  less than 1            Intake/Output Last 24 hours No intake or output data in the 24 hours ending 05/16/23 1132  Labs/Imaging Results for orders placed or performed during the hospital encounter of 05/16/23 (from the past 48 hour(s))  Basic metabolic panel     Status: Abnormal   Collection Time: 05/16/23  7:58 AM  Result Value Ref Range   Sodium 132 (L) 135 - 145 mmol/L   Potassium 3.8 3.5 - 5.1 mmol/L   Chloride 98 98 - 111 mmol/L   CO2 22 22 - 32 mmol/L   Glucose, Bld 116 (H) 70 - 99 mg/dL    Comment: Glucose reference range applies only to samples taken after fasting for at least 8 hours.   BUN 24 (H) 8 - 23 mg/dL   Creatinine, Ser 9.56 (H) 0.44 - 1.00 mg/dL   Calcium 8.6 (L) 8.9 - 10.3 mg/dL   GFR, Estimated 36 (L) >60 mL/min  Comment: (NOTE) Calculated using the CKD-EPI Creatinine Equation (2021)    Anion gap 12 5 - 15    Comment: Performed at [facility], [address], [place], [place] [address]  CBC     Status: Abnormal   Collection Time: 05/16/23  7:58 AM  Result Value Ref Range   WBC 21.3 (H) 4.0 - 10.5 K/uL   RBC 3.73 (L) 3.87 - 5.11 MIL/uL   Hemoglobin 11.1 (L) 12.0 - 15.0 g/dL   HCT 19.1 (L) 47.8 - 29.5 %   MCV 93.3 80.0 - 100.0 fL   MCH 29.8 26.0 - 34.0 pg   MCHC 31.9 30.0 - 36.0 g/dL   RDW 62.1 30.8 - 65.7 %   Platelets 382 150 - 400 K/uL   nRBC 0.0 0.0 - 0.2 %    Comment: Performed at [facility], [address], [place], [place] [address]  CBG monitoring, ED     Status: Abnormal   Collection Time: 05/16/23   8:27 AM  Result Value Ref Range   Glucose-Capillary 120 (H) 70 - 99 mg/dL    Comment: Glucose reference range applies only to samples taken after fasting for at least 8 hours.  Troponin I (High Sensitivity)     Status: None   Collection Time: 05/16/23  8:37 AM  Result Value Ref Range   Troponin I (High Sensitivity) 8 <18 ng/L    Comment: (NOTE) Elevated high sensitivity troponin I (hsTnI) values and significant  changes across serial measurements may suggest ACS but many other  chronic and acute conditions are known to elevate hsTnI results.  Refer to the "Links" section for chest pain algorithms and additional  guidance. Performed at [facility], [address] [place], [place] [address]   Urinalysis, Routine w reflex microscopic -Urine, Clean Catch     Status: Abnormal   Collection Time: 05/16/23 10:52 AM  Result Value Ref Range   Color, Urine STRAW (A) YELLOW   APPearance CLEAR CLEAR   Specific Gravity, Urine 1.004 (L) 1.005 - 1.030   pH 7.0 5.0 - 8.0   Glucose, UA NEGATIVE NEGATIVE mg/dL   Hgb urine dipstick SMALL (A) NEGATIVE   Bilirubin Urine NEGATIVE NEGATIVE   Ketones, ur NEGATIVE NEGATIVE mg/dL   Protein, ur NEGATIVE NEGATIVE mg/dL   Nitrite NEGATIVE NEGATIVE   Leukocytes,Ua NEGATIVE NEGATIVE   RBC / HPF 0-5 0 - 5 RBC/hpf   WBC, UA 0-5 0 - 5 WBC/hpf   Bacteria, UA NONE SEEN NONE SEEN   Squamous Epithelial / HPF 0-5 0 - 5 /HPF    Comment: Performed at [facility], [address], [place], [place] [address]   DG Chest 2 View  Result Date: 05/16/2023 CLINICAL DATA:  67 year old female with syncope getting off toilet. Fibrotic interstitial lung disease. EXAM: CHEST - 2 VIEW COMPARISON:  High-resolution chest CT 02/11/2023 and earlier. FINDINGS: Semi upright AP and lateral views of the chest at 0853 hours. Pulmonary hyperinflation and coarse chronic interstitial thickening bilaterally. Since February new consolidation of the right  middle lobe. Similar appearance on 2009 radiographs. No other confluent lung opacity. No pneumothorax or pleural effusion. Stable cardiac size and mediastinal contours. Visualized tracheal air column is within normal limits. No acute osseous abnormality identified. Abdominal Calcified aortic atherosclerosis. Paucity of bowel gas in the abdomen. IMPRESSION: Chronic interstitial lung disease with new Right Middle Lobe Consolidation since February. This is nonspecific but might be an acute infectious exacerbation. If there  are signs/symptoms of infection then Followup PA and lateral chest X-ray is recommended in 3-4 weeks following trial of antibiotic therapy to ensure resolution and exclude underlying malignancy. If not consider Chest CT (IV contrast preferred) to further characterize. Electronically Signed   By: Odessa Fleming [name]   On: 05/16/2023 09:09   CT Head Wo Contrast  Result Date: 05/16/2023 CLINICAL DATA:  67 year old female status post syncope this morning when getting off toilet. Awoke on ground. EXAM: CT HEAD WITHOUT CONTRAST TECHNIQUE: Contiguous axial images were obtained from the base of the skull through the vertex without intravenous contrast. RADIATION DOSE REDUCTION: This exam was performed according to the departmental dose-optimization program which includes automated exposure control, adjustment of the mA and/or kV according to patient size and/or use of iterative reconstruction technique. COMPARISON:  Head CT 07/11/2016. FINDINGS: Brain: Cerebral volume is within normal limits for age. No midline shift, ventriculomegaly, mass effect, evidence of mass lesion, intracranial hemorrhage or evidence of cortically based acute infarction. Gray-white matter differentiation is within normal limits throughout the brain. Vascular: Calcified atherosclerosis at the skull base. No suspicious intracranial vascular hyperdensity. Chronic right MCA calcified plaque. Skull: No acute osseous abnormality identified.  Left TMJ chronic degeneration. Sinuses/Orbits: Visualized paranasal sinuses and mastoids are clear. Other: Visualized orbits and scalp soft tissues are within normal limits. IMPRESSION: Normal for age non contrast CT appearance of the brain. Electronically Signed   By: Odessa Fleming [name]   On: 05/16/2023 09:06    Pending Labs Unresulted Labs (From admission, onward)     Start     Ordered   05/16/23 1121  Respiratory (~20 pathogens) panel by PCR  (Respiratory panel by PCR (~20 pathogens, ~24 hr TAT)  w precautions)  Once,   R        05/16/23 1120   05/16/23 1121  SARS Coronavirus 2 by RT PCR (hospital order, performed in [facility] hospital lab) *cepheid single result test* Anterior Nasal Swab  (Tier 2 - SARS Coronavirus 2 by RT PCR (hospital order, performed in [facility] hospital lab) *cepheid single result test*)  Once,   R        05/16/23 1120   05/16/23 1120  HIV Antibody (routine testing w rflx)  (HIV Antibody (Routine testing w reflex) panel)  Once,   R        05/16/23 1120   05/16/23 1120  Expectorated Sputum Assessment w Gram Stain, Rflx to Resp Cult  Once,   R        05/16/23 1120   05/16/23 1120  Legionella Pneumophila Serogp 1 Ur Ag  Once,   R        05/16/23 1120   05/16/23 1120  Strep pneumoniae urinary antigen  Once,   R        05/16/23 1120   05/16/23 1103  Differential  Once,   URGENT        05/16/23 1102   05/16/23 1058  Blood culture (routine x 2)  BLOOD CULTURE X 2,   R (with STAT occurrences)      05/16/23 1057            Vitals/Pain Today's Vitals   05/16/23 0737 05/16/23 0830 05/16/23 0930 05/16/23 1000  BP:  (!) 98/50 (!) 104/55 (!) 105/47  Pulse:  78  67  Resp:  (!) 22 17 (!) 23  Temp:      TempSrc:      SpO2:  96% 98% 95%  Weight: 49.4 kg  Height: 5\' 4"  (1.626 m)       Isolation Precautions Airborne and Contact precautions  Medications Medications  azithromycin (ZITHROMAX) 500 mg in sodium chloride 0.9 % 250 mL IVPB (500 mg Intravenous New  Bag/Given 05/16/23 1129)  cefTRIAXone (ROCEPHIN) 2 g in sodium chloride 0.9 % 100 mL IVPB (2 g Intravenous New Bag/Given 05/16/23 1130)  cefTRIAXone (ROCEPHIN) 2 g in sodium chloride 0.9 % 100 mL IVPB (has no administration in time range)  azithromycin (ZITHROMAX) 500 mg in sodium chloride 0.9 % 250 mL IVPB (has no administration in time range)  sodium chloride 0.9 % bolus 1,000 mL (0 mLs Intravenous Stopped 05/16/23 1043)  Tdap (BOOSTRIX) injection 0.5 mL (0.5 mLs Intramuscular Given 05/16/23 0920)    Mobility walks     Focused Assessments Cardiac Assessment Handoff:    No results found for: "CKTOTAL", "CKMB", "CKMBINDEX", "TROPONINI" Lab Results  Component Value Date   DDIMER 0.56 (H) 12/02/2020   Does the Patient currently have chest pain? No    R Recommendations: See Admitting Provider Note  Report given to:   Additional Notes:

## 2023-05-16 NOTE — ED Provider Notes (Signed)
[place] EMERGENCY DEPARTMENT AT [facility] Provider Note   CSN: [id number] Arrival date & time: 05/16/23  4098     History {Add pertinent medical, surgical, social history, OB history to HPI:1} Chief Complaint  Patient presents with   Loss of Consciousness   Cough    RHEMA LOYAL is a 67 y.o. female.  Patient has a history of scleroderma and interstitial lung disease.  She has had a cough that has been productive for over a week.  She was treated for pneumonia last month.  Patient has syncopal episode also this morning   Loss of Consciousness Cough      Home Medications Prior to Admission medications   Medication Sig Start Date End Date Taking? Authorizing Provider  acetaminophen (TYLENOL) 325 MG tablet Take 325 mg by mouth every 6 (six) hours as needed for mild pain.    [provider]  albuterol (PROVENTIL) (2.5 MG/3ML) 0.083% nebulizer solution Take 3 mLs (2.5 mg total) by nebulization every 6 (six) hours as needed for wheezing or shortness of breath. 05/14/23   Leslye Peer, MD  albuterol (VENTOLIN HFA) 108 (90 Base) MCG/ACT inhaler Inhale 2 puffs into the lungs every 6 (six) hours as needed for wheezing or shortness of breath. 04/16/23   Parrett, Virgel Bouquet, NP  amLODipine (NORVASC) 5 MG tablet Take 5 mg by mouth daily.  06/05/16   [provider]  benzonatate (TESSALON) 200 MG capsule Take 1 capsule (200 mg total) by mouth 3 (three) times daily as needed. Patient not taking: Reported on 05/07/2023 04/16/23 04/15/24  Parrett, Virgel Bouquet, NP  Cholecalciferol (VITAMIN D3) 125 MCG (5000 UT) CAPS Take 1 capsule (5,000 Units total) by mouth daily. 01/18/23   Tysinger, Kermit Balo, PA-C  diphenhydrAMINE (BENADRYL) 25 mg capsule Take 25 mg by mouth every 6 (six) hours as needed.    [provider]  fluticasone (FLONASE) 50 MCG/ACT nasal spray Place 1 spray into both nostrils daily.    [provider]  fluticasone furoate-vilanterol (BREO  ELLIPTA) 200-25 MCG/ACT AEPB Inhale 1 puff into the lungs daily. 03/06/23   Tysinger, Kermit Balo, PA-C  levothyroxine (SYNTHROID) 75 MCG tablet Take 1 tablet (75 mcg total) by mouth daily. 01/18/23 01/18/24  Tysinger, Kermit Balo, PA-C  lisinopril (ZESTRIL) 5 MG tablet Take 5 mg by mouth daily.    [provider]  omeprazole (PRILOSEC) 20 MG capsule Take 20 mg by mouth 2 (two) times daily before a meal.    [provider]  predniSONE (DELTASONE) 20 MG tablet Take 1 tablet (20 mg total) by mouth daily with breakfast. Patient not taking: Reported on 05/07/2023 04/16/23   Parrett, Virgel Bouquet, NP  pseudoephedrine (SUDAFED) 120 MG 12 hr tablet Take 120 mg by mouth 2 (two) times daily as needed for congestion.    [provider]  rosuvastatin (CRESTOR) 10 MG tablet Take 1 tablet (10 mg total) by mouth daily. 01/19/23 01/19/24  Tysinger, Kermit Balo, PA-C  famotidine (PEPCID) 20 MG tablet Take 20 mg by mouth 2 (two) times daily.    03/18/12  [provider]      Allergies    Avelox [moxifloxacin hcl in nacl], Bactrim [sulfamethoxazole-trimethoprim], and Codeine    Review of Systems   Review of Systems  Respiratory:  Positive for cough.   Cardiovascular:  Positive for syncope.    Physical Exam Updated Vital Signs BP (!) 105/47   Pulse 67   Temp 99.5 F (37.5 C) (Oral)  Resp (!) 23   Ht 5\' 4"  (1.626 m)   Wt 49.4 kg   SpO2 95%   BMI 18.71 kg/m  Physical Exam  ED Results / Procedures / Treatments   Labs (all labs ordered are listed, but only abnormal results are displayed) Labs Reviewed  BASIC METABOLIC PANEL - Abnormal; Notable for the following components:      Result Value   Sodium 132 (*)    Glucose, Bld 116 (*)    BUN 24 (*)    Creatinine, Ser 1.57 (*)    Calcium 8.6 (*)    GFR, Estimated 36 (*)    All other components within normal limits  CBC - Abnormal; Notable for the following components:   WBC 21.3 (*)    RBC 3.73 (*)    Hemoglobin 11.1 (*)    HCT 34.8  (*)    All other components within normal limits  URINALYSIS, ROUTINE W REFLEX MICROSCOPIC - Abnormal; Notable for the following components:   Color, Urine STRAW (*)    Specific Gravity, Urine 1.004 (*)    Hgb urine dipstick SMALL (*)    All other components within normal limits  CBG MONITORING, ED - Abnormal; Notable for the following components:   Glucose-Capillary 120 (*)    All other components within normal limits  CULTURE, BLOOD (ROUTINE X 2)  CULTURE, BLOOD (ROUTINE X 2)  EXPECTORATED SPUTUM ASSESSMENT W GRAM STAIN, RFLX TO RESP C  RESPIRATORY PANEL BY PCR  SARS CORONAVIRUS 2 BY RT PCR  DIFFERENTIAL  HIV ANTIBODY (ROUTINE TESTING W REFLEX)  LEGIONELLA PNEUMOPHILA SEROGP 1 UR AG  STREP PNEUMONIAE URINARY ANTIGEN  TROPONIN I (HIGH SENSITIVITY)  TROPONIN I (HIGH SENSITIVITY)    EKG None  Radiology DG Chest 2 View  Result Date: 05/16/2023 CLINICAL DATA:  67 year old female with syncope getting off toilet. Fibrotic interstitial lung disease. EXAM: CHEST - 2 VIEW COMPARISON:  High-resolution chest CT 02/11/2023 and earlier. FINDINGS: Semi upright AP and lateral views of the chest at 0853 hours. Pulmonary hyperinflation and coarse chronic interstitial thickening bilaterally. Since February new consolidation of the right middle lobe. Similar appearance on 2009 radiographs. No other confluent lung opacity. No pneumothorax or pleural effusion. Stable cardiac size and mediastinal contours. Visualized tracheal air column is within normal limits. No acute osseous abnormality identified. Abdominal Calcified aortic atherosclerosis. Paucity of bowel gas in the abdomen. IMPRESSION: Chronic interstitial lung disease with new Right Middle Lobe Consolidation since February. This is nonspecific but might be an acute infectious exacerbation. If there are signs/symptoms of infection then Followup PA and lateral chest X-ray is recommended in 3-4 weeks following trial of antibiotic therapy to ensure  resolution and exclude underlying malignancy. If not consider Chest CT (IV contrast preferred) to further characterize. Electronically Signed   By: Odessa Fleming [name]   On: 05/16/2023 09:09   CT Head Wo Contrast  Result Date: 05/16/2023 CLINICAL DATA:  67 year old female status post syncope this morning when getting off toilet. Awoke on ground. EXAM: CT HEAD WITHOUT CONTRAST TECHNIQUE: Contiguous axial images were obtained from the base of the skull through the vertex without intravenous contrast. RADIATION DOSE REDUCTION: This exam was performed according to the departmental dose-optimization program which includes automated exposure control, adjustment of the mA and/or kV according to patient size and/or use of iterative reconstruction technique. COMPARISON:  Head CT 07/11/2016. FINDINGS: Brain: Cerebral volume is within normal limits for age. No midline shift, ventriculomegaly, mass effect, evidence of mass lesion, intracranial hemorrhage or  evidence of cortically based acute infarction. Gray-white matter differentiation is within normal limits throughout the brain. Vascular: Calcified atherosclerosis at the skull base. No suspicious intracranial vascular hyperdensity. Chronic right MCA calcified plaque. Skull: No acute osseous abnormality identified. Left TMJ chronic degeneration. Sinuses/Orbits: Visualized paranasal sinuses and mastoids are clear. Other: Visualized orbits and scalp soft tissues are within normal limits. IMPRESSION: Normal for age non contrast CT appearance of the brain. Electronically Signed   By: Odessa Fleming [name]   On: 05/16/2023 09:06    Procedures Procedures  {Document cardiac monitor, telemetry assessment procedure when appropriate:1}  Medications Ordered in ED Medications  azithromycin (ZITHROMAX) 500 mg in sodium chloride 0.9 % 250 mL IVPB (500 mg Intravenous New Bag/Given 05/16/23 1129)  cefTRIAXone (ROCEPHIN) 2 g in sodium chloride 0.9 % 100 mL IVPB (2 g Intravenous New Bag/Given  05/16/23 1130)  cefTRIAXone (ROCEPHIN) 2 g in sodium chloride 0.9 % 100 mL IVPB (has no administration in time range)  azithromycin (ZITHROMAX) 500 mg in sodium chloride 0.9 % 250 mL IVPB (has no administration in time range)  sodium chloride 0.9 % bolus 1,000 mL (0 mLs Intravenous Stopped 05/16/23 1043)  Tdap (BOOSTRIX) injection 0.5 mL (0.5 mLs Intramuscular Given 05/16/23 0920)    ED Course/ Medical Decision Making/ A&P   {   Click here for ABCD2, HEART and other calculatorsREFRESH Note before signing :1}                          Medical Decision Making Amount and/or Complexity of Data Reviewed Labs: ordered. Radiology: ordered. ECG/medicine tests: ordered.  Risk Prescription drug management. Decision regarding hospitalization.   Patient with community-acquired pneumonia.  She will be admitted for IV antibiotics  {Document critical care time when appropriate:1} {Document review of labs and clinical decision tools ie heart score, Chads2Vasc2 etc:1}  {Document your independent review of radiology images, and any outside records:1} {Document your discussion with family members, caretakers, and with consultants:1} {Document social determinants of health affecting pt's care:1} {Document your decision making why or why not admission, treatments were needed:1} Final Clinical Impression(s) / ED Diagnoses Final diagnoses:  Community acquired pneumonia of right upper lobe of lung    Rx / DC Orders ED Discharge Orders     None

## 2023-05-16 NOTE — H&P (Signed)
History and Physical    [name] ZOX:[id number] DOB: 22-Dec-1956 DOA: 05/16/2023  PCP: Jac Canavan, PA-C  Patient coming from: home  I have personally briefly reviewed patient's old medical records in [facility]  Chief Complaint: syncopal episode.  shortness of breath, fevers, chills  HPI: [name] is [name] 67 y.o. female with medical history significant of scleroderma, raynaud's, HTN, GERD and multiple other medical issues who presents with cough, chills, and night sweats.  She notes her symptoms started Thursday with cough.  It got more productive over the next day.  She notes symptoms improved early this weekend, but then she started to feel poorly again.  Notes last night she had chills and night sweats.  She had an episode of loss of consciousness and decided to come to the hospital.  She was most recently treated for [name] respiratory infection back in April.  Denies smoking, drinking.  ED Course:  Labs, abx, IV, imaging.  Admit for pneumonia.    Review of Systems: As per HPI otherwise all other systems reviewed and are negative.  Past Medical History:  Diagnosis Date   Allergic rhinitis    Asthma    Atherosclerosis    Chronic kidney disease (CKD), stage III (moderate) (HCC)    Esophageal stricture    GERD (gastroesophageal reflux disease)    History of anemia due to chronic kidney disease    History of colitis 2017   Hypertension    Metabolic bone disease    Raynaud disease    Scleroderma (HCC)     Past Surgical History:  Procedure Laterality Date   CESAREAN SECTION     COLONOSCOPY WITH PROPOFOL N/[name] 07/14/2016   Procedure: COLONOSCOPY WITH PROPOFOL;  Surgeon: Rachael Fee, MD;  Location: [facility];  Service: Endoscopy;  Laterality: N/[name];   HAND SURGERY     left; tendon repair   TONSILLECTOMY      Social History  reports that she has never smoked. She has never used smokeless tobacco. She reports that she does not drink alcohol and does not use  drugs.  Allergies  Allergen Reactions   Avelox [Moxifloxacin Hcl In Nacl]     Caused tachycardia   Bactrim [Sulfamethoxazole-Trimethoprim]    Codeine Hives and Other (See Comments)    Hallucination,     Family History  Problem Relation Age of Onset   Heart failure Mother    Hepatitis Mother        C   Dementia Mother    COPD Mother        emphysema   Cancer Father        lung with mets to brain   Diabetes Sister    Hepatitis Brother    Cancer Sister        leukemia   Colon cancer Neg Hx    Prior to Admission medications   Medication Sig Start Date End Date Taking? Authorizing Provider  acetaminophen (TYLENOL) 325 MG tablet Take 325 mg by mouth every 6 (six) hours as needed for mild pain.    [provider]  albuterol (PROVENTIL) (2.5 MG/3ML) 0.083% nebulizer solution Take 3 mLs (2.5 mg total) by nebulization every 6 (six) hours as needed for wheezing or shortness of breath. 05/14/23   Leslye Peer, MD  albuterol (VENTOLIN HFA) 108 (90 Base) MCG/ACT inhaler Inhale 2 puffs into the lungs every 6 (six) hours as needed for wheezing or shortness of breath. 04/16/23   Parrett, Virgel Bouquet, NP  amLODipine (NORVASC) 5 MG tablet Take 5 mg by mouth daily.  06/05/16   [provider]  Cholecalciferol (VITAMIN D3) 125 MCG (5000 UT) CAPS Take 1 capsule (5,000 Units total) by mouth daily. 01/18/23   Tysinger, Kermit Balo, PA-C  diphenhydrAMINE (BENADRYL) 25 mg capsule Take 25 mg by mouth every 6 (six) hours as needed.    [provider]  fluticasone (FLONASE) 50 MCG/ACT nasal spray Place 1 spray into both nostrils daily.    [provider]  fluticasone furoate-vilanterol (BREO ELLIPTA) 200-25 MCG/ACT AEPB Inhale 1 puff into the lungs daily. 03/06/23   Tysinger, Kermit Balo, PA-C  levothyroxine (SYNTHROID) 75 MCG tablet Take 1 tablet (75 mcg total) by mouth daily. 01/18/23 01/18/24  Tysinger, Kermit Balo, PA-C  lisinopril (ZESTRIL) 5 MG tablet Take 5 mg by mouth daily.     [provider]  omeprazole (PRILOSEC) 20 MG capsule Take 20 mg by mouth 2 (two) times daily before Stana Bayon meal.    [provider]  pseudoephedrine (SUDAFED) 120 MG 12 hr tablet Take 120 mg by mouth 2 (two) times daily as needed for congestion.    [provider]  famotidine (PEPCID) 20 MG tablet Take 20 mg by mouth 2 (two) times daily.    03/18/12  [provider]    Physical Exam: Vitals:   05/16/23 0930 05/16/23 1000 05/16/23 1223 05/16/23 1343  BP: (!) 104/55 (!) 105/47 (!) 97/52 (!) 103/53  Pulse:  67 80 82  Resp: 17 (!) 23 20 16   Temp:   99.1 F (37.3 C) (!) 101.4 F (38.6 C)  TempSrc:   Oral Oral  SpO2: 98% 95% 92% 97%  Weight:      Height:        Constitutional: NAD, calm, comfortable Vitals:   05/16/23 0930 05/16/23 1000 05/16/23 1223 05/16/23 1343  BP: (!) 104/55 (!) 105/47 (!) 97/52 (!) 103/53  Pulse:  67 80 82  Resp: 17 (!) 23 20 16   Temp:   99.1 F (37.3 C) (!) 101.4 F (38.6 C)  TempSrc:   Oral Oral  SpO2: 98% 95% 92% 97%  Weight:      Height:       Eyes: PERRL, lids and conjunctivae normal ENMT: Mucous membranes are moist. Neck: normal, supple Respiratory: diminished, unlabored Cardiovascular: RRR Abdomen: no tenderness, no masses palpated.  Musculoskeletal: no clubbing / cyanosis. No joint deformity upper and lower extremities. Good ROM, no contractures. Normal muscle tone.  Skin: no rashes, lesions, ulcers. No induration Neurologic: CN 2-12 grossly intact. Moving all extremities.  Psychiatric: Normal judgment and insight. Alert and oriented x 3. Normal mood.   Labs on Admission: I have personally reviewed following labs and imaging studies  CBC: Recent Labs  Lab 05/16/23 0758  WBC 21.3*  NEUTROABS 17.7*  HGB 11.1*  HCT 34.8*  MCV 93.3  PLT 382    Basic Metabolic Panel: Recent Labs  Lab 05/16/23 0758  NA 132*  K 3.8  CL 98  CO2 22  GLUCOSE 116*  BUN 24*  CREATININE 1.57*  CALCIUM 8.6*     GFR: Estimated Creatinine Clearance: 27.9 mL/min (Prudence Heiny) (by C-G formula based on SCr of 1.57 mg/dL (H)).  Liver Function Tests: No results for input(s): "AST", "ALT", "ALKPHOS", "BILITOT", "PROT", "ALBUMIN" in the last 168 hours.  Urine analysis:    Component Value Date/Time   COLORURINE STRAW (Lynnix Schoneman) 05/16/2023 1052   APPEARANCEUR CLEAR 05/16/2023 1052   LABSPEC 1.004 (L) 05/16/2023 1052  LABSPEC 1.010 01/15/2023 1459   PHURINE 7.0 05/16/2023 1052   GLUCOSEU NEGATIVE 05/16/2023 1052   GLUCOSEU Negative 02/03/2010 0947   HGBUR SMALL (Andreanna Mikolajczak) 05/16/2023 1052   BILIRUBINUR NEGATIVE 05/16/2023 1052   BILIRUBINUR negative 01/15/2023 1459   BILIRUBINUR n 07/02/2016 1026   KETONESUR NEGATIVE 05/16/2023 1052   PROTEINUR NEGATIVE 05/16/2023 1052   UROBILINOGEN negative 07/02/2016 1026   UROBILINOGEN 0.2 02/08/2010 0744   NITRITE NEGATIVE 05/16/2023 1052   LEUKOCYTESUR NEGATIVE 05/16/2023 1052    Radiological Exams on Admission: DG Chest 2 View  Result Date: 05/16/2023 CLINICAL DATA:  67 year old female with syncope getting off toilet. Fibrotic interstitial lung disease. EXAM: CHEST - 2 VIEW COMPARISON:  High-resolution chest CT 02/11/2023 and earlier. FINDINGS: Semi upright AP and lateral views of the chest at 0853 hours. Pulmonary hyperinflation and coarse chronic interstitial thickening bilaterally. Since February new consolidation of the right middle lobe. Similar appearance on 2009 radiographs. No other confluent lung opacity. No pneumothorax or pleural effusion. Stable cardiac size and mediastinal contours. Visualized tracheal air column is within normal limits. No acute osseous abnormality identified. Abdominal Calcified aortic atherosclerosis. Paucity of bowel gas in the abdomen. IMPRESSION: Chronic interstitial lung disease with new Right Middle Lobe Consolidation since February. This is nonspecific but might be an acute infectious exacerbation. If there are signs/symptoms of infection then  Followup PA and lateral chest X-ray is recommended in 3-4 weeks following trial of antibiotic therapy to ensure resolution and exclude underlying malignancy. If not consider Chest CT (IV contrast preferred) to further characterize. Electronically Signed   By: Odessa Fleming [name]   On: 05/16/2023 09:09   CT Head Wo Contrast  Result Date: 05/16/2023 CLINICAL DATA:  67 year old female status post syncope this morning when getting off toilet. Awoke on ground. EXAM: CT HEAD WITHOUT CONTRAST TECHNIQUE: Contiguous axial images were obtained from the base of the skull through the vertex without intravenous contrast. RADIATION DOSE REDUCTION: This exam was performed according to the departmental dose-optimization program which includes automated exposure control, adjustment of the mA and/or kV according to patient size and/or use of iterative reconstruction technique. COMPARISON:  Head CT 07/11/2016. FINDINGS: Brain: Cerebral volume is within normal limits for age. No midline shift, ventriculomegaly, mass effect, evidence of mass lesion, intracranial hemorrhage or evidence of cortically based acute infarction. Gray-white matter differentiation is within normal limits throughout the brain. Vascular: Calcified atherosclerosis at the skull base. No suspicious intracranial vascular hyperdensity. Chronic right MCA calcified plaque. Skull: No acute osseous abnormality identified. Left TMJ chronic degeneration. Sinuses/Orbits: Visualized paranasal sinuses and mastoids are clear. Other: Visualized orbits and scalp soft tissues are within normal limits. IMPRESSION: Normal for age non contrast CT appearance of the brain. Electronically Signed   By: Odessa Fleming [name]   On: 05/16/2023 09:06    EKG: Independently reviewed. sinus  Assessment/Plan Principal Problem:   Pneumonia    Assessment and Plan:  Sepsis due to Community Acquired Pneumonia  Interstitial Lung Disease Rules in with fever, leukocytosis, tachypnea On RA at this  time CXR with right middle lobe consolidation -> needs follow up chest imaging in 3-4 weeks to ensure resolution  Some wheezing on exam, steroids Ceftriaxone, azithromycin RVP, covid Follow blood cultures Strict I/O, daily weights Follows with Dr. Delton Coombes outpatient for ILD -> consider pulm c/s if she develops O2 need or not improving Last pulm notes mentions concern for chronic recurrent aspiration and GI referral - I think we can hold off on this for now  Syncope  Related to pneumonia, acute illness Monitor on tele Echo given murmur on exam (echo from 2022 with mild AV regurg) Head CT without acute abnormalities  Orthostatics   Hx Scleroderma  Raynaud's  Lisinopril on hold with AKI   AKI  Mild  UA bland Consider renal US if not improving  Hypertension Hold lisinopril with AKI amlodipine  Hypothyroidism Synthroid  GERD PPI     DVT prophylaxis: lovenox  Code Status:   rull  Family Communication:  Daughters at bedside  Disposition Plan:   Patient is from:  home  Anticipated DC to:  home  Anticipated DC date:  Pending improvement  Anticipated DC barriers: Pending improvement  Consults called:  none  Admission status:  obs   Severity of Illness: The appropriate patient status for this patient is OBSERVATION. Observation status is judged to be reasonable and necessary in order to provide the required intensity of service to ensure the patient's safety. The patient's presenting symptoms, physical exam findings, and initial radiographic and laboratory data in the context of their medical condition is felt to place them at decreased risk for further clinical deterioration. Furthermore, it is anticipated that the patient will be medically stable for discharge from the hospital within 2 midnights of admission.     Lacretia Nicks MD [facility]  How to contact the [facility] Attending or Consulting provider 7A - 7P or covering provider during after hours 7P -7A, for this  patient?   Check the care team in [facility] and look for Temekia Caskey) attending/consulting TR[facility] provider listed and b) the [facility] team listed Log into [email or web address] and use [place]'s universal password to access. If you do not have the password, please contact the hospital operator. Locate the [facility] provider you are looking for under [facility] and page to Yaroslav Gombos number that you can be directly reached. If you still have difficulty reaching the provider, please page the [facility] (Director on Call) for the Hospitalists listed on amion for assistance.  05/16/2023, 3:56 PM

## 2023-05-16 NOTE — Progress Notes (Signed)
Pt's heart rate, per telemetry was 39-40's. Entered into pt's room. She was noted to be asymptomatic, minus pillow case being damp. She was awakened and was alert and oriented x4. No c/o pain or discomfort. NT assisted pt to bathroom. Pt's gait is steady. Heart rate back up to high 80s(87). Monitoring.

## 2023-05-17 ENCOUNTER — Observation Stay (HOSPITAL_BASED_OUTPATIENT_CLINIC_OR_DEPARTMENT_OTHER): Payer: Medicare HMO

## 2023-05-17 DIAGNOSIS — R011 Cardiac murmur, unspecified: Secondary | ICD-10-CM | POA: Diagnosis not present

## 2023-05-17 DIAGNOSIS — A419 Sepsis, unspecified organism: Secondary | ICD-10-CM | POA: Diagnosis not present

## 2023-05-17 DIAGNOSIS — J189 Pneumonia, unspecified organism: Secondary | ICD-10-CM | POA: Diagnosis not present

## 2023-05-17 LAB — CBC
HCT: 32.5 % — ABNORMAL LOW (ref 36.0–46.0)
Hemoglobin: 10.2 g/dL — ABNORMAL LOW (ref 12.0–15.0)
MCH: 29.8 pg (ref 26.0–34.0)
MCHC: 31.4 g/dL (ref 30.0–36.0)
MCV: 95 fL (ref 80.0–100.0)
Platelets: 300 10*3/uL (ref 150–400)
RBC: 3.42 MIL/uL — ABNORMAL LOW (ref 3.87–5.11)
RDW: 12.9 % (ref 11.5–15.5)
WBC: 15.8 10*3/uL — ABNORMAL HIGH (ref 4.0–10.5)
nRBC: 0 % (ref 0.0–0.2)

## 2023-05-17 LAB — COMPREHENSIVE METABOLIC PANEL
ALT: 13 U/L (ref 0–44)
AST: 19 U/L (ref 15–41)
Albumin: 3.2 g/dL — ABNORMAL LOW (ref 3.5–5.0)
Alkaline Phosphatase: 48 U/L (ref 38–126)
Anion gap: 12 (ref 5–15)
BUN: 20 mg/dL (ref 8–23)
CO2: 22 mmol/L (ref 22–32)
Calcium: 8.6 mg/dL — ABNORMAL LOW (ref 8.9–10.3)
Chloride: 100 mmol/L (ref 98–111)
Creatinine, Ser: 1.4 mg/dL — ABNORMAL HIGH (ref 0.44–1.00)
GFR, Estimated: 42 mL/min — ABNORMAL LOW (ref >60)
Glucose, Bld: 126 mg/dL — ABNORMAL HIGH (ref 70–99)
Potassium: 4.1 mmol/L (ref 3.5–5.1)
Sodium: 134 mmol/L — ABNORMAL LOW (ref 135–145)
Total Bilirubin: 0.6 mg/dL (ref 0.3–1.2)
Total Protein: 7 g/dL (ref 6.5–8.1)

## 2023-05-17 LAB — CULTURE, BLOOD (ROUTINE X 2)
Culture: NO GROWTH
Special Requests: ADEQUATE

## 2023-05-17 LAB — ECHOCARDIOGRAM COMPLETE
AR max vel: 1.76 cm2
AV Area VTI: 2.33 cm2
AV Area mean vel: 2.15 cm2
AV Mean grad: 3 mmHg
AV Peak grad: 5.4 mmHg
Ao pk vel: 1.16 m/s
Area-P 1/2: 5.38 cm2
Height: 64 in
S' Lateral: 2.9 cm
Weight: 1860.8 oz

## 2023-05-17 MED ORDER — PREDNISONE 20 MG PO TABS: 40.0000 mg | ORAL_TABLET | Freq: Every day | ORAL | 0 refills | Status: AC

## 2023-05-17 MED ORDER — AMOXICILLIN 500 MG PO CAPS: 1000.0000 mg | ORAL_CAPSULE | Freq: Three times a day (TID) | ORAL | 0 refills | Status: AC

## 2023-05-17 MED ORDER — AZITHROMYCIN 250 MG PO TABS
500.0000 mg | ORAL_TABLET | Freq: Every day | ORAL | Status: DC
  Administered 2023-05-17: 500 mg via ORAL
  Filled 2023-05-17: qty 2

## 2023-05-17 MED ORDER — AZITHROMYCIN 500 MG PO TABS: 500.0000 mg | ORAL_TABLET | Freq: Every day | ORAL | 0 refills | Status: AC

## 2023-05-17 NOTE — Progress Notes (Signed)
Mobility Specialist - Progress Note   05/17/23 0952  Mobility  Activity Ambulated with assistance in hallway;Ambulated with assistance to bathroom  Level of Assistance Independent after set-up  Assistive Device Other (Comment) (IV Pole)  Distance Ambulated (ft) 275 ft  Activity Response Tolerated well  Mobility Referral Yes  $Mobility charge 1 Mobility  Mobility Specialist Start Time (ACUTE ONLY) [id number]  Mobility Specialist Stop Time (ACUTE ONLY) 0950  Mobility Specialist Time Calculation (min) (ACUTE ONLY) 7 min   Pt received in bed and agreeable to mobility. No complaints during session. Pt to bed after session with all needs met.     [facility]

## 2023-05-17 NOTE — Progress Notes (Addendum)
SATURATION QUALIFICATIONS: (This note is used to comply with regulatory documentation for home oxygen)  Patient Saturations on Room Air at Rest = 92%  Patient Saturations on Room Air while Ambulating = 88%  Patient Saturations on 2 Liters of oxygen while Ambulating = 94%  Please briefly explain why patient needs home oxygen: Patient desats with ambulation.

## 2023-05-17 NOTE — Progress Notes (Signed)
Discharge instructions given to patient and all questions were answered.  

## 2023-05-17 NOTE — Progress Notes (Addendum)
Patient ambulated 154ft on room air, patient desated to 88% and came back up to 90% on room air.

## 2023-05-17 NOTE — Progress Notes (Signed)
  Echocardiogram 2D Echocardiogram has been performed.  [name] 05/17/2023, 10:44 AM

## 2023-05-17 NOTE — Progress Notes (Signed)
Telemetry reading asystole. Entered pt's room. She is awake and moving around in bed. She spoke with nurse. Stating that she was okay. No complaints. CCM called nurse to check on pt due to this. Informed them that she was okay. Telemetry reading heart rate in 50s-60s at present time. No distress. Monitoring. Telemetry remains intact

## 2023-05-17 NOTE — Progress Notes (Signed)
Johann Capers, NP was up on unit. Informed him of what telemetry readings were showing. He looked at pt's telemetry readings. No orders received.

## 2023-05-17 NOTE — TOC Transition Note (Signed)
Transition of Care [facility]) - CM/SW Discharge Note  Patient Details  Name: [name] MRN: [id number] Date of Birth: November 27, 1956  Transition of Care [facility]) CM/SW Contact:  Ewing Schlein, LCSW Phone Number: 05/17/2023, 1:34 PM  Clinical Narrative: Patient will need home oxygen and due to insurance will need to be provided by [employer]. CSW spoke with daughter and she is agreeable to having [employer] deliver a tank to the patient's room. CSW made home oxygen referral to [employer]. [employer] will deliver the tank once insurance has been processed. CSW updated daughter. TOC signing off.    Final next level of care: Home/Self Care Barriers to Discharge: No Barriers Identified  Patient Goals and CMS Choice CMS Medicare.gov Compare Post Acute Care list provided to:: Patient Represenative (must comment) Choice offered to / list presented to : Adult Children  Discharge Plan and Services Additional resources added to the After Visit Summary for        DME Arranged: Oxygen DME Agency: [employer] Date DME Agency Contacted: 05/17/23 Representative spoke with at DME Agency: Darral Dash  Social Determinants of Health (SDOH) Interventions SDOH Screenings   Food Insecurity: No Food Insecurity (05/16/2023)  Housing: Low Risk  (05/16/2023)  Transportation Needs: No Transportation Needs (05/16/2023)  Utilities: Not At Risk (05/16/2023)  Alcohol Screen: Low Risk  (12/29/2022)  Depression (PHQ2-9): Low Risk  (01/15/2023)  Financial Resource Strain: Low Risk  (12/29/2022)  Physical Activity: Insufficiently Active (12/29/2022)  Social Connections: Moderately Isolated (12/29/2022)  Stress: No Stress Concern Present (12/29/2022)  Tobacco Use: Low Risk  (05/07/2023)   Readmission Risk Interventions     No data to display

## 2023-05-17 NOTE — Discharge Summary (Signed)
Physician Discharge Summary  [name] WJX:[id number] DOB: 1956/11/28 DOA: 05/16/2023  PCP: Jac Canavan, PA-C  Admit date: 05/16/2023 Discharge date: 05/17/2023  Time spent: 40 minutes  Recommendations for Outpatient Follow-up:  Follow outpatient CBC/CMP  Follow pending cultures Follow long term O2 needs Follow echo results outpatient  Needs repeat CXR in 3-4 weeks Follow aortic dilatation outpatient, mildly elevated PASP   Discharge Diagnoses:  Principal Problem:   Sepsis due to pneumonia [facility])   Discharge Condition: stable  Diet recommendation: heart healthy  Filed Weights   05/16/23 0737 05/17/23 0547  Weight: 49.4 kg 52.8 kg    History of present illness:   [name] is [name] 67 y.o. female with medical history significant of scleroderma, raynaud's, HTN, GERD and multiple other medical issues who presents with cough, chills, and night sweats and an episode of syncope.  She was found to have pneumonia and be rhinovirus positive.  She's improved with steroids and antibiotics.  Discharging 5/20 in stable condition with supplemental O2 with activity.   See below for additional details  Hospital Course:  Assessment and Plan:  Sepsis due to Community Acquired Pneumonia  Rhinovirus Infection  Interstitial Lung Disease Rules in with fever, leukocytosis, tachypnea On RA at this time (desats to 88% with ambulation, will discharge with 2 L to use with activity and when asleep) CXR with right middle lobe consolidation -> needs follow up chest imaging in 3-4 weeks to ensure resolution  Some wheezing on exam, steroids -> 5 day course Ceftriaxone, azithromycin -> complete course with amox/azithro RVP, covid -> positive for rhinovirus Follow blood cultures (NG<24 hrs) Strict I/O, daily weights Follows with Dr. Delton Coombes outpatient for ILD -> follow outpatient  Last pulm notes mentions concern for chronic recurrent aspiration and GI referral - I think we can hold off on  this for now -> follow outpatient for GI follow up   Syncope Related to pneumonia, acute illness Monitor on tele Echo with EF 60-65%, no RWMA, RVSF normal, mildly elevated PASP, aortic dilatation - follow outpatient  Head CT without acute abnormalities  Orthostatics    Hx Scleroderma  Raynaud's  Lisinopril    AKI  Mild  UA bland Seems close to recent baseline, follow outpatient    Hypertension Lisinopril  amlodipine   Hypothyroidism Synthroid   GERD PPI     Procedures: Echo IMPRESSIONS     1. Left ventricular ejection fraction, by estimation, is 60 to 65%. The  left ventricle has normal function. The left ventricle has no regional  wall motion abnormalities. Left ventricular diastolic parameters are  indeterminate.   2. Right ventricular systolic function is normal. The right ventricular  size is normal. There is mildly elevated pulmonary artery systolic  pressure.   3. Left atrial size was mildly dilated.   4. The mitral valve is normal in structure. Trivial mitral valve  regurgitation. No evidence of mitral stenosis.   5. The aortic valve is tricuspid. Aortic valve regurgitation is trivial.  No aortic stenosis is present.   6. Aortic dilatation noted. There is borderline dilatation of the  ascending aorta, measuring 38 mm.   7. The inferior vena cava is normal in size with greater than 50%  respiratory variability, suggesting right atrial pressure of 3 mmHg.   Comparison(s): Changes from prior study are noted. Prior Ascending aorta  measured 40 mm, today measures 38 mm.   Conclusion(s)/Recommendation(s): Otherwise normal echocardiogram, with  minor abnormalities described in the report.  Consultations: none  Discharge Exam: Vitals:   05/17/23 0819 05/17/23 1334  BP:  (!) 115/56  Pulse:  84  Resp:  18  Temp:  98 F (36.7 C)  SpO2: 98% 96%   Eager to go home Feeling better  General: No acute distress. Cardiovascular: RRR Lungs:  scattered rhonchi at bases Abdomen: Soft, nontender, nondistended Neurological: Alert and oriented 3. Moves all extremities 4 with equal strength. Cranial nerves II through XII grossly intact. Extremities: No clubbing or cyanosis. No edema.   Discharge Instructions   Discharge Instructions     Call MD for:  difficulty breathing, headache or visual disturbances   Complete by: As directed    Call MD for:  extreme fatigue   Complete by: As directed    Call MD for:  hives   Complete by: As directed    Call MD for:  persistant dizziness or light-headedness   Complete by: As directed    Call MD for:  persistant nausea and vomiting   Complete by: As directed    Call MD for:  redness, tenderness, or signs of infection (pain, swelling, redness, odor or green/yellow discharge around incision site)   Complete by: As directed    Call MD for:  severe uncontrolled pain   Complete by: As directed    Call MD for:  temperature >100.4   Complete by: As directed    Diet - low sodium heart healthy   Complete by: As directed    Discharge instructions   Complete by: As directed    You were seen for pneumonia.  You were positive for rhinovirus.  We'll send you with [name] course of antibiotics to cover you for [name] bacterial pneumonia as well.  In addition, we'll send with [name] short course of steroids.    Please arrange follow up with Dr. Delton Coombes within the next few weeks.  Follow up with your PCP within Korie Brabson week or so.  You need oxygen with activity.  I suspect this will be temporary.  Use 2 L with activity and at night when you sleep.  Follow up with your PCP and pulmonology to determine if this can be weaned off.  Your echo showed elevated pulmonary artery systolic pressure and aortic dilatation which can be followed outpatient with your PCP.    Your syncopal episode (loss of consciousness/fainting) was because of your infection.  Change positions slowly and stay hydrated.   Return for new, recurrent, or  worsening symptoms.  Please ask your PCP to request records from this hospitalization so they know what was done and what the next steps will be.   Increase activity slowly   Complete by: As directed       Allergies as of 05/17/2023       Reactions   Avelox [moxifloxacin Hcl In Nacl]    Caused tachycardia   Bactrim [sulfamethoxazole-trimethoprim]    Codeine Hives, Other (See Comments)   Hallucination,         Medication List     TAKE these medications    acetaminophen 325 MG tablet Commonly known as: TYLENOL Take 325 mg by mouth every 6 (six) hours as needed for mild pain.   albuterol 108 (90 Base) MCG/ACT inhaler Commonly known as: VENTOLIN HFA Inhale 2 puffs into the lungs every 6 (six) hours as needed for wheezing or shortness of breath.   albuterol (2.5 MG/3ML) 0.083% nebulizer solution Commonly known as: PROVENTIL Take 3 mLs (2.5 mg total) by nebulization every 6 (six)  hours as needed for wheezing or shortness of breath.   amLODipine 5 MG tablet Commonly known as: NORVASC Take 5 mg by mouth daily.   amoxicillin 500 MG capsule Commonly known as: AMOXIL Take 2 capsules (1,000 mg total) by mouth 3 (three) times daily for 3 days. Start taking on: May 18, 2023   azithromycin 500 MG tablet Commonly known as: Zithromax Take 1 tablet (500 mg total) by mouth daily for 3 days. Start taking on: May 18, 2023   diphenhydrAMINE 25 mg capsule Commonly known as: BENADRYL Take 25 mg by mouth every 6 (six) hours as needed for allergies.   fluticasone 50 MCG/ACT nasal spray Commonly known as: FLONASE Place 1 spray into both nostrils daily.   fluticasone furoate-vilanterol 200-25 MCG/ACT Aepb Commonly known as: Breo Ellipta Inhale 1 puff into the lungs daily.   levothyroxine 75 MCG tablet Commonly known as: Synthroid Take 1 tablet (75 mcg total) by mouth daily.   lisinopril 5 MG tablet Commonly known as: ZESTRIL Take 5 mg by mouth daily.   omeprazole 20 MG  capsule Commonly known as: PRILOSEC Take 20 mg by mouth 2 (two) times daily before Shailene Demonbreun meal.   predniSONE 20 MG tablet Commonly known as: DELTASONE Take 2 tablets (40 mg total) by mouth daily for 3 days.   pseudoephedrine 120 MG 12 hr tablet Commonly known as: SUDAFED Take 120 mg by mouth daily.   Vitamin D3 125 MCG (5000 UT) Caps Take 1 capsule (5,000 Units total) by mouth daily. What changed: when to take this               Durable Medical Equipment  (From admission, onward)           Start     Ordered   05/17/23 1209  For home use only DME oxygen  Once       Comments: SATURATION QUALIFICATIONS: (This note is used to comply with regulatory documentation for home oxygen)   Patient Saturations on Room Air at Rest = 92%   Patient Saturations on Room Air while Ambulating = 88%   Patient Saturations on 2 Liters of oxygen while Ambulating = 94%   Please briefly explain why patient needs home oxygen: Patient desats with ambulation.  Question Answer Comment  Length of Need 6 Months   Mode or (Route) Nasal cannula   Liters per Minute 2   Frequency Continuous (stationary and portable oxygen unit needed)   Oxygen conserving device Yes   Oxygen delivery system Gas      05/17/23 1208           Allergies  Allergen Reactions   Avelox [Moxifloxacin Hcl In Nacl]     Caused tachycardia   Bactrim [Sulfamethoxazole-Trimethoprim]    Codeine Hives and Other (See Comments)    Hallucination,     Follow-up Information     [facility] Follow up.   Why: [facility] will provide home oxygen.                 The results of significant diagnostics from this hospitalization (including imaging, microbiology, ancillary and laboratory) are listed below for reference.    Significant Diagnostic Studies: ECHOCARDIOGRAM COMPLETE  Result Date: 05/17/2023    ECHOCARDIOGRAM REPORT   Patient Name:   BELLE COSTE Date of Exam: 05/17/2023 Medical Rec #:  [id number]        Height:       64.0 in Accession #:    [id number]  Weight:       116.3 lb Date of Birth:  09-15-1956       BSA:          1.554 m Patient Age:    66 years        BP:           102/63 mmHg Patient Gender: F               HR:           80 bpm. Exam Location:  Inpatient Procedure: 2D Echo, Cardiac Doppler and Color Doppler Indications:    Murmur  History:        Patient has prior history of Echocardiogram examinations, most                 recent 08/11/2021. Signs/Symptoms:Murmur; Risk                 Factors:Hypertension.  Sonographer:    Lucy Antigua Referring Phys: [phone] Ryian Lynde CALDWELL [name] JR IMPRESSIONS  1. Left ventricular ejection fraction, by estimation, is 60 to 65%. The left ventricle has normal function. The left ventricle has no regional wall motion abnormalities. Left ventricular diastolic parameters are indeterminate.  2. Right ventricular systolic function is normal. The right ventricular size is normal. There is mildly elevated pulmonary artery systolic pressure.  3. Left atrial size was mildly dilated.  4. The mitral valve is normal in structure. Trivial mitral valve regurgitation. No evidence of mitral stenosis.  5. The aortic valve is tricuspid. Aortic valve regurgitation is trivial. No aortic stenosis is present.  6. Aortic dilatation noted. There is borderline dilatation of the ascending aorta, measuring 38 mm.  7. The inferior vena cava is normal in size with greater than 50% respiratory variability, suggesting right atrial pressure of 3 mmHg. Comparison(s): Changes from prior study are noted. Prior Ascending aorta measured 40 mm, today measures 38 mm. Conclusion(s)/Recommendation(s): Otherwise normal echocardiogram, with minor abnormalities described in the report. FINDINGS  Left Ventricle: Left ventricular ejection fraction, by estimation, is 60 to 65%. The left ventricle has normal function. The left ventricle has no regional wall motion abnormalities. The left ventricular internal cavity size  was normal in size. There is  no left ventricular hypertrophy. Left ventricular diastolic parameters are indeterminate. Right Ventricle: The right ventricular size is normal. Right vetricular wall thickness was not well visualized. Right ventricular systolic function is normal. There is mildly elevated pulmonary artery systolic pressure. The tricuspid regurgitant velocity  is 2.62 m/s, and with an assumed right atrial pressure of 3 mmHg, the estimated right ventricular systolic pressure is 30.5 mmHg. Left Atrium: Left atrial size was mildly dilated. Right Atrium: Right atrial size was normal in size. Pericardium: There is no evidence of pericardial effusion. Mitral Valve: The mitral valve is normal in structure. Trivial mitral valve regurgitation. No evidence of mitral valve stenosis. Tricuspid Valve: The tricuspid valve is normal in structure. Tricuspid valve regurgitation is mild . No evidence of tricuspid stenosis. Aortic Valve: The aortic valve is tricuspid. Aortic valve regurgitation is trivial. No aortic stenosis is present. Aortic valve mean gradient measures 3.0 mmHg. Aortic valve peak gradient measures 5.4 mmHg. Aortic valve area, by VTI measures 2.33 cm. Pulmonic Valve: The pulmonic valve was grossly normal. Pulmonic valve regurgitation is trivial. No evidence of pulmonic stenosis. Aorta: Aortic dilatation noted. There is borderline dilatation of the ascending aorta, measuring 38 mm. Venous: The inferior vena cava is normal in size with greater than 50% respiratory  variability, suggesting right atrial pressure of 3 mmHg. IAS/Shunts: The atrial septum is grossly normal.  LEFT VENTRICLE PLAX 2D LVIDd:         4.10 cm   Diastology LVIDs:         2.90 cm   LV e' medial:    4.79 cm/s LV PW:         1.00 cm   LV E/e' medial:  19.3 LV IVS:        0.80 cm   LV e' lateral:   7.07 cm/s LVOT diam:     1.90 cm   LV E/e' lateral: 13.1 LV SV:         67 LV SV Index:   43 LVOT Area:     2.84 cm  RIGHT VENTRICLE              IVC RV S prime:     11.70 cm/s  IVC diam: 1.50 cm RVOT diam:      2.80 cm TAPSE (M-mode): 2.2 cm LEFT ATRIUM             Index        RIGHT ATRIUM           Index LA Vol (A2C):   50.8 ml 32.69 ml/m  RA Area:     11.90 cm LA Vol (A4C):   52.5 ml 33.79 ml/m  RA Volume:   28.50 ml  18.34 ml/m LA Biplane Vol: 51.7 ml 33.27 ml/m  AORTIC VALVE AV Area (Vmax):    1.76 cm AV Area (Vmean):   2.15 cm AV Area (VTI):     2.33 cm AV Vmax:           116.00 cm/s AV Vmean:          82.800 cm/s AV VTI:            0.287 m AV Peak Grad:      5.4 mmHg AV Mean Grad:      3.0 mmHg LVOT Vmax:         72.20 cm/s LVOT Vmean:        62.700 cm/s LVOT VTI:          0.236 m LVOT/AV VTI ratio: 0.82  AORTA Ao Root diam: 3.30 cm Ao Asc diam:  3.80 cm MITRAL VALVE               TRICUSPID VALVE MV Area (PHT): 5.38 cm    TR Peak grad:   27.5 mmHg MV Decel Time: 141 msec    TR Vmax:        262.00 cm/s MV E velocity: 92.50 cm/s MV Candon Caras velocity: 68.60 cm/s  SHUNTS MV E/[place] ratio:  1.35        Systemic VTI:  0.24 m                            Systemic Diam: 1.90 cm                            Pulmonic Diam: 2.80 cm Jodelle Red MD Electronically signed by Jodelle Red MD Signature Date/Time: 05/17/2023/2:18:25 PM    Final    DG Chest 2 View  Result Date: 05/16/2023 CLINICAL DATA:  67 year old female with syncope getting off toilet. Fibrotic interstitial lung disease. EXAM: CHEST - 2 VIEW COMPARISON:  High-resolution chest CT 02/11/2023 and earlier. FINDINGS: Semi upright AP  and lateral views of the chest at 0853 hours. Pulmonary hyperinflation and coarse chronic interstitial thickening bilaterally. Since February new consolidation of the right middle lobe. Similar appearance on 2009 radiographs. No other confluent lung opacity. No pneumothorax or pleural effusion. Stable cardiac size and mediastinal contours. Visualized tracheal air column is within normal limits. No acute osseous abnormality identified. Abdominal Calcified  aortic atherosclerosis. Paucity of bowel gas in the abdomen. IMPRESSION: Chronic interstitial lung disease with new Right Middle Lobe Consolidation since February. This is nonspecific but might be an acute infectious exacerbation. If there are signs/symptoms of infection then Followup PA and lateral chest X-ray is recommended in 3-4 weeks following trial of antibiotic therapy to ensure resolution and exclude underlying malignancy. If not consider Chest CT (IV contrast preferred) to further characterize. Electronically Signed   By: Odessa Fleming [name]   On: 05/16/2023 09:09   CT Head Wo Contrast  Result Date: 05/16/2023 CLINICAL DATA:  67 year old female status post syncope this morning when getting off toilet. Awoke on ground. EXAM: CT HEAD WITHOUT CONTRAST TECHNIQUE: Contiguous axial images were obtained from the base of the skull through the vertex without intravenous contrast. RADIATION DOSE REDUCTION: This exam was performed according to the departmental dose-optimization program which includes automated exposure control, adjustment of the mA and/or kV according to patient size and/or use of iterative reconstruction technique. COMPARISON:  Head CT 07/11/2016. FINDINGS: Brain: Cerebral volume is within normal limits for age. No midline shift, ventriculomegaly, mass effect, evidence of mass lesion, intracranial hemorrhage or evidence of cortically based acute infarction. Gray-white matter differentiation is within normal limits throughout the brain. Vascular: Calcified atherosclerosis at the skull base. No suspicious intracranial vascular hyperdensity. Chronic right MCA calcified plaque. Skull: No acute osseous abnormality identified. Left TMJ chronic degeneration. Sinuses/Orbits: Visualized paranasal sinuses and mastoids are clear. Other: Visualized orbits and scalp soft tissues are within normal limits. IMPRESSION: Normal for age non contrast CT appearance of the brain. Electronically Signed   By: Odessa Fleming [name]    On: 05/16/2023 09:06    Microbiology: Recent Results (from the past 240 hour(s))  Blood culture (routine x 2)     Status: None (Preliminary result)   Collection Time: 05/16/23 11:35 AM   Specimen: BLOOD  Result Value Ref Range Status   Specimen Description   Final    BLOOD BLOOD LEFT HAND Performed at [facility], [address] [place], [place] [address]    Special Requests   Final    BOTTLES DRAWN AEROBIC AND ANAEROBIC Blood Culture adequate volume Performed at [facility], [address], [place], [place] [address]    Culture   Final    NO GROWTH < 24 HOURS Performed at [facility], [address] [place], [place] [address]    Report Status PENDING  Incomplete  Blood culture (routine x 2)     Status: None (Preliminary result)   Collection Time: 05/16/23  1:02 PM   Specimen: BLOOD  Result Value Ref Range Status   Specimen Description   Final    BLOOD BLOOD LEFT ARM Performed at [facility], [address] [place], [place] [address]    Special Requests   Final    BOTTLES DRAWN AEROBIC ONLY Blood Culture adequate volume Performed at [facility], [address], [place], [place] [address]    Culture   Final    NO GROWTH < 24 HOURS Performed at [facility],  [address], [place], [place] [address]    Report Status PENDING  Incomplete  Respiratory (~20 pathogens) panel by PCR     Status: Abnormal   Collection Time: 05/16/23  1:28 PM   Specimen: Nasopharyngeal Swab; Respiratory  Result Value Ref Range Status   Adenovirus NOT DETECTED NOT DETECTED Final   Coronavirus 229E NOT DETECTED NOT DETECTED Final    Comment: (NOTE) The Coronavirus on the Respiratory Panel, DOES NOT test for the novel  Coronavirus (2019 nCoV)    Coronavirus HKU1 NOT DETECTED NOT DETECTED Final   Coronavirus NL63 NOT DETECTED NOT DETECTED Final   Coronavirus OC43 NOT DETECTED NOT DETECTED Final    Metapneumovirus NOT DETECTED NOT DETECTED Final   Rhinovirus / Enterovirus DETECTED ([place]) NOT DETECTED Final   Influenza Chrisoula Zegarra NOT DETECTED NOT DETECTED Final   Influenza B NOT DETECTED NOT DETECTED Final   Parainfluenza Virus 1 NOT DETECTED NOT DETECTED Final   Parainfluenza Virus 2 NOT DETECTED NOT DETECTED Final   Parainfluenza Virus 3 NOT DETECTED NOT DETECTED Final   Parainfluenza Virus 4 NOT DETECTED NOT DETECTED Final   Respiratory Syncytial Virus NOT DETECTED NOT DETECTED Final   Bordetella pertussis NOT DETECTED NOT DETECTED Final   Bordetella Parapertussis NOT DETECTED NOT DETECTED Final   Chlamydophila pneumoniae NOT DETECTED NOT DETECTED Final   Mycoplasma pneumoniae NOT DETECTED NOT DETECTED Final    Comment: Performed at [facility], [address] [place], [place] [address]  SARS Coronavirus 2 by RT PCR (hospital order, performed in [facility] hospital lab) *cepheid single result test* Nasopharyngeal Swab     Status: None   Collection Time: 05/16/23  1:28 PM   Specimen: Nasopharyngeal Swab; Nasal Swab  Result Value Ref Range Status   SARS Coronavirus 2 by RT PCR NEGATIVE NEGATIVE Final    Comment: (NOTE) SARS-CoV-2 target nucleic acids are NOT DETECTED.  The SARS-CoV-2 RNA is generally detectable in upper and lower respiratory specimens during the acute phase of infection. The lowest concentration of SARS-CoV-2 viral copies this assay can detect is 250 copies / mL. Nitin Mckowen negative result does not preclude SARS-CoV-2 infection and should not be used as the sole basis for treatment or other patient management decisions.  Shaddai Shapley negative result may occur with improper specimen collection / handling, submission of specimen other than nasopharyngeal swab, presence of viral mutation(s) within the areas targeted by this assay, and inadequate number of viral copies (<250 copies / mL). Kortez Murtagh negative result must be combined with clinical observations, patient history, and epidemiological  information.  Fact Sheet for Patients:   RoadLapTop.co.za  Fact Sheet for Healthcare Providers: http://kim-miller.com/  This test is not yet approved or  cleared by the [place] FDA and has been authorized for detection and/or diagnosis of SARS-CoV-2 by FDA under an Emergency Use Authorization (EUA).  This EUA will remain in effect (meaning this test can be used) for the duration of the COVID-19 declaration under Section 564(b)(1) of the Act, 21 U.S.C. section 360bbb-3(b)(1), unless the authorization is terminated or revoked sooner.  Performed at [facility], [address] [place], [place] [address]      Labs: Basic Metabolic Panel: Recent Labs  Lab 05/16/23 0758 05/16/23 1600 05/17/23 0439  NA 132*  --  134*  K 3.8  --  4.1  CL 98  --  100  CO2 22  --  22  GLUCOSE 116*  --  126*  BUN 24*  --  20  CREATININE 1.57* 1.51* 1.40*  CALCIUM 8.6*  --  8.6*   Liver Function Tests: Recent Labs  Lab 05/17/23 0439  AST 19  ALT 13  ALKPHOS 48  BILITOT 0.6  PROT 7.0  ALBUMIN 3.2*   No results for input(s): "LIPASE", "AMYLASE" in the last 168 hours. No results for input(s): "AMMONIA" in the last 168 hours. CBC: Recent Labs  Lab 05/16/23 0758 05/16/23 1600 05/17/23 0439  WBC 21.3* 19.8* 15.8*  NEUTROABS 17.7*  --   --   HGB 11.1* 9.2* 10.2*  HCT 34.8* 28.9* 32.5*  MCV 93.3 94.8 95.0  PLT 382 295 300   Cardiac Enzymes: No results for input(s): "CKTOTAL", "CKMB", "CKMBINDEX", "TROPONINI" in the last 168 hours. BNP: BNP (last 3 results) No results for input(s): "BNP" in the last 8760 hours.  ProBNP (last 3 results) No results for input(s): "PROBNP" in the last 8760 hours.  CBG: Recent Labs  Lab 05/16/23 0827  GLUCAP 120*       Signed:  Lacretia Nicks MD.  [facility] 05/17/2023, 3:52 PM

## 2023-05-18 LAB — LEGIONELLA PNEUMOPHILA SEROGP 1 UR AG: L. pneumophila Serogp 1 Ur Ag: NEGATIVE

## 2023-05-18 LAB — CULTURE, BLOOD (ROUTINE X 2): Special Requests: ADEQUATE

## 2023-05-19 LAB — CULTURE, BLOOD (ROUTINE X 2): Culture: NO GROWTH

## 2023-05-20 LAB — CULTURE, BLOOD (ROUTINE X 2)

## 2023-05-21 ENCOUNTER — Telehealth: Payer: Self-pay | Admitting: Pulmonary Disease

## 2023-05-21 NOTE — Telephone Encounter (Signed)
PT was in hospital recently and needs to check on O2 order. The hospital sent her home with O2. Pls call @ [phone] is her cell and the good #.

## 2023-05-21 NOTE — Telephone Encounter (Signed)
Called and spoke with pt about her oxygen and also called and spoke with Melissa from [employer] making sure they had everything they needed for pt's O2. Stated to pt that with ambulation, she needs to be on 2L O2 and she verbalized understanding. Nothing further needed.

## 2023-06-04 ENCOUNTER — Ambulatory Visit (INDEPENDENT_AMBULATORY_CARE_PROVIDER_SITE_OTHER): Payer: Medicare HMO | Admitting: Medical

## 2023-06-04 VITALS — BP 112/76 | HR 68 | Wt 106.0 lb

## 2023-06-04 DIAGNOSIS — Z8701 Personal history of pneumonia (recurrent): Secondary | ICD-10-CM | POA: Diagnosis not present

## 2023-06-04 DIAGNOSIS — D72829 Elevated white blood cell count, unspecified: Secondary | ICD-10-CM | POA: Diagnosis not present

## 2023-06-04 DIAGNOSIS — J849 Interstitial pulmonary disease, unspecified: Secondary | ICD-10-CM | POA: Diagnosis not present

## 2023-06-04 DIAGNOSIS — M25561 Pain in right knee: Secondary | ICD-10-CM

## 2023-06-04 DIAGNOSIS — I10 Essential (primary) hypertension: Secondary | ICD-10-CM | POA: Diagnosis not present

## 2023-06-04 DIAGNOSIS — M25461 Effusion, right knee: Secondary | ICD-10-CM | POA: Diagnosis not present

## 2023-06-04 DIAGNOSIS — M349 Systemic sclerosis, unspecified: Secondary | ICD-10-CM | POA: Diagnosis not present

## 2023-06-04 DIAGNOSIS — Z9289 Personal history of other medical treatment: Secondary | ICD-10-CM | POA: Diagnosis not present

## 2023-06-04 DIAGNOSIS — J45909 Unspecified asthma, uncomplicated: Secondary | ICD-10-CM

## 2023-06-04 LAB — COMPREHENSIVE METABOLIC PANEL

## 2023-06-04 LAB — CBC
Hemoglobin: 9.7 g/dL — ABNORMAL LOW (ref 11.1–15.9)
MCH: 30 pg (ref 26.6–33.0)
MCHC: 33.1 g/dL (ref 31.5–35.7)
Platelets: 246 10*3/uL (ref 150–450)
RBC: 3.23 x10E6/uL — ABNORMAL LOW (ref 3.77–5.28)
WBC: 8.9 10*3/uL (ref 3.4–10.8)

## 2023-06-04 MED ORDER — HYDROCODONE-ACETAMINOPHEN 5-325 MG PO TABS: 1.0000 | ORAL_TABLET | Freq: Two times a day (BID) | ORAL | 0 refills | Status: DC | PRN

## 2023-06-04 NOTE — Patient Instructions (Signed)
Recommendations Take it easy and rest the knee for the most part of the next few days You can use compression sleeve or Ace wrap as needed in the next few days You can use Tylenol over-the-counter 2-3 times daily or you can use the hydrocodone Tylenol combo prescription for worse pain or breakthrough pain up to twice a day.  Do not take over-the-counter Tylenol and the Norco hydrocodone within 4 hours of each other since the hydrocodone has Tylenol in it already You can use topical creams such as Aspercreme, IcyHot, or diclofenac gel over-the-counter

## 2023-06-04 NOTE — Progress Notes (Signed)
Subjective:  [name] is a 67 y.o. female who presents for Chief Complaint  Patient presents with   Knee Pain    Right knee pain, pain since tuesday     Current Health Care Team: Dentist, Dr.Morrison Eye doctor, seen in February Dr. Levy Pupa, pulmonology Dr. Rob Bunting, [facility] Dr. Casimer Lanius, Rheumatology [facility] Dr. Zetta Bills, [facility] Dr. Linna Hoff, podiatry Dr. Nanetta Batty, cardiology  Here for right knee pain x 4 days.  Was out in the yard 5 days ago.  Bent over to do some gardening, instead of squatting.  Later that evening felt a pressure in back of right knee. Next morning a little worse.  Tried some topical cream like icy hot this week, tried some cold therapy.  2 days ago took Tylenol which helped most of the day.   The next morning had swelling and heat to the knee.  Yesterday overall was worse but stenosis are not as bad as yesterday.  No history of gout.  No recent strenuous activity or specific injury fall or trauma.  No fever.  She was hospitalized 05/16/2023 through 05/17/2023 due to sepsis from pneumonia.  She is much improved at this point.  She has follow-up with pulmonology soon regarding the hospital visit  No other aggravating or relieving factors.    No other c/o.  Past Medical History:  Diagnosis Date   Allergic rhinitis    Asthma    Atherosclerosis    Chronic kidney disease (CKD), stage III (moderate) (HCC)    Esophageal stricture    GERD (gastroesophageal reflux disease)    History of anemia due to chronic kidney disease    History of colitis 2017   Hypertension    Metabolic bone disease    Raynaud disease    Scleroderma (HCC)    Current Outpatient Medications on File Prior to Visit  Medication Sig Dispense Refill   acetaminophen (TYLENOL) 325 MG tablet Take 325 mg by mouth every 6 (six) hours as needed for mild pain.     albuterol (PROVENTIL) (2.5 MG/3ML) 0.083% nebulizer solution  Take 3 mLs (2.5 mg total) by nebulization every 6 (six) hours as needed for wheezing or shortness of breath. 75 mL 3   albuterol (VENTOLIN HFA) 108 (90 Base) MCG/ACT inhaler Inhale 2 puffs into the lungs every 6 (six) hours as needed for wheezing or shortness of breath. 8 g 3   amLODipine (NORVASC) 5 MG tablet Take 5 mg by mouth daily.      Cholecalciferol (VITAMIN D3) 125 MCG (5000 UT) CAPS Take 1 capsule (5,000 Units total) by mouth daily. (Patient taking differently: Take 5,000 Units by mouth 3 (three) times a week.) 90 capsule 1   diphenhydrAMINE (BENADRYL) 25 mg capsule Take 25 mg by mouth every 6 (six) hours as needed for allergies.     fluticasone (FLONASE) 50 MCG/ACT nasal spray Place 1 spray into both nostrils daily.     fluticasone furoate-vilanterol (BREO ELLIPTA) 200-25 MCG/ACT AEPB Inhale 1 puff into the lungs daily. 180 each 1   levothyroxine (SYNTHROID) 75 MCG tablet Take 1 tablet (75 mcg total) by mouth daily. 30 tablet 1   lisinopril (ZESTRIL) 5 MG tablet Take 5 mg by mouth daily.     omeprazole (PRILOSEC) 20 MG capsule Take 20 mg by mouth 2 (two) times daily before a meal.     pseudoephedrine (SUDAFED) 120 MG 12 hr tablet Take 120 mg by mouth daily.     [  DISCONTINUED] famotidine (PEPCID) 20 MG tablet Take 20 mg by mouth 2 (two) times daily.       No current facility-administered medications on file prior to visit.     The following portions of the patient's history were reviewed and updated as appropriate: allergies, current medications, past family history, past medical history, past social history, past surgical history and problem list.  ROS Otherwise as in subjective above  Objective: BP 112/76   Pulse 68   Wt 106 lb (48.1 kg)   BMI 18.19 kg/m   General appearance: alert, no distress, well developed, well nourished HEENT: normocephalic, sclerae anicteric, conjunctiva pink and moist, TMs pearly, nares patent, no discharge or erythema, pharynx normal Oral cavity:  MMM, no lesions Neck: supple, no lymphadenopathy, no thyromegaly, no masses Heart: RRR, normal S1, S2, no murmurs Lungs: CTA bilaterally, no wheezes, rhonchi, or rales MSK: Mild swelling of the knee in general, nontender to palpation, mild pain with leg extension, mild tenderness with posterior popliteal area palpation, no obvious laxity, negative McMurray's, otherwise legs with normal range of motion and nontender Pulses: 2+ radial pulses, 1+ + pedal pulses, normal cap refill Ext: no edema   Assessment: Encounter Diagnoses  Name Primary?   Pain and swelling of knee, right Yes   History of recent pneumonia    History of recent hospitalization    Leukocytosis, unspecified type    Essential hypertension    Intrinsic asthma    Scleroderma (HCC)    ILD (interstitial lung disease) (HCC)      Plan: I reviewed her recent hospitalization notes, discharge summary, medicines reconciled.  She has follow-up with pulmonology soon.  She feels much better in regards to the pneumonia.  Continue Breo daily, albuterol prn.  Her right knee pain and swelling was discussed.  Doubt gout.  I suspect this is scleroderma related joint inflammation.  No obvious signs of infection either.  Labs as below today as part of follow-up from the hospital also due to the right knee pain and swelling.  Recommendations Take it easy and rest the knee for the most part of the next few days You can use compression sleeve or Ace wrap as needed in the next few days You can use Tylenol over-the-counter 2-3 times daily or you can use the hydrocodone Tylenol combo prescription for worse pain or breakthrough pain up to twice a day.  Do not take over-the-counter Tylenol and the Norco hydrocodone within 4 hours of each other since the hydrocodone has Tylenol in it already You can use topical creams such as Aspercreme, IcyHot, or diclofenac gel over-the-counter    [name] was seen today for knee pain.  Diagnoses and all  orders for this visit:  Pain and swelling of knee, right -     Comprehensive metabolic panel -     CBC -     Sedimentation rate -     Uric acid  History of recent pneumonia -     Comprehensive metabolic panel -     CBC  History of recent hospitalization -     Comprehensive metabolic panel -     CBC  Leukocytosis, unspecified type -     CBC  Essential hypertension  Intrinsic asthma  Scleroderma (HCC)  ILD (interstitial lung disease) (HCC)  Other orders -     HYDROcodone-acetaminophen (NORCO) 5-325 MG tablet; Take 1 tablet by mouth 2 (two) times daily as needed.    Follow up: pending labs, pulmonology followup

## 2023-06-05 LAB — COMPREHENSIVE METABOLIC PANEL
AST: 20 IU/L (ref 0–40)
Albumin/Globulin Ratio: 1.3 (ref 1.2–2.2)
Albumin: 3.7 g/dL — ABNORMAL LOW (ref 3.9–4.9)
Alkaline Phosphatase: 61 IU/L (ref 44–121)
BUN/Creatinine Ratio: 16 (ref 12–28)
BUN: 24 mg/dL (ref 8–27)
Bilirubin Total: 0.5 mg/dL (ref 0.0–1.2)
CO2: 24 mmol/L (ref 20–29)
Chloride: 93 mmol/L — ABNORMAL LOW (ref 96–106)
Creatinine, Ser: 1.49 mg/dL — ABNORMAL HIGH (ref 0.57–1.00)
Sodium: 129 mmol/L — ABNORMAL LOW (ref 134–144)
Total Protein: 6.6 g/dL (ref 6.0–8.5)
eGFR: 39 mL/min/{1.73_m2} — ABNORMAL LOW (ref >59)

## 2023-06-05 LAB — SEDIMENTATION RATE: Sed Rate: 44 mm/hr — ABNORMAL HIGH (ref 0–40)

## 2023-06-05 LAB — URIC ACID: Uric Acid: 6.6 mg/dL (ref 3.0–7.2)

## 2023-06-05 LAB — CBC
Hematocrit: 29.3 % — ABNORMAL LOW (ref 34.0–46.6)
MCV: 91 fL (ref 79–97)
RDW: 12.3 % (ref 11.7–15.4)

## 2023-06-08 NOTE — Progress Notes (Signed)
Labs show chronic anemia.  Sodium was quite low, glucose elevated. Uric acid ok.    Lets get her back this week to recheck on sodium, anemia, repeat labs.  Needs to be drinking 60-80 ounces of water daily

## 2023-06-11 ENCOUNTER — Telehealth: Payer: Self-pay | Admitting: Medical

## 2023-06-11 NOTE — Telephone Encounter (Signed)
She has question about her recent labs

## 2023-06-11 NOTE — Telephone Encounter (Signed)
Discussed lab results with patient again

## 2023-06-17 DIAGNOSIS — N189 Chronic kidney disease, unspecified: Secondary | ICD-10-CM | POA: Diagnosis not present

## 2023-06-17 DIAGNOSIS — M25561 Pain in right knee: Secondary | ICD-10-CM | POA: Diagnosis not present

## 2023-06-17 DIAGNOSIS — R21 Rash and other nonspecific skin eruption: Secondary | ICD-10-CM | POA: Diagnosis not present

## 2023-06-17 DIAGNOSIS — I351 Nonrheumatic aortic (valve) insufficiency: Secondary | ICD-10-CM | POA: Diagnosis not present

## 2023-06-17 DIAGNOSIS — M25562 Pain in left knee: Secondary | ICD-10-CM | POA: Diagnosis not present

## 2023-06-17 DIAGNOSIS — I7300 Raynaud's syndrome without gangrene: Secondary | ICD-10-CM | POA: Diagnosis not present

## 2023-06-17 DIAGNOSIS — M349 Systemic sclerosis, unspecified: Secondary | ICD-10-CM | POA: Diagnosis not present

## 2023-06-17 DIAGNOSIS — M810 Age-related osteoporosis without current pathological fracture: Secondary | ICD-10-CM | POA: Diagnosis not present

## 2023-06-17 DIAGNOSIS — M79643 Pain in unspecified hand: Secondary | ICD-10-CM | POA: Diagnosis not present

## 2023-06-17 DIAGNOSIS — D649 Anemia, unspecified: Secondary | ICD-10-CM | POA: Diagnosis not present

## 2023-06-18 DIAGNOSIS — M349 Systemic sclerosis, unspecified: Secondary | ICD-10-CM | POA: Diagnosis not present

## 2023-06-23 DIAGNOSIS — N189 Chronic kidney disease, unspecified: Secondary | ICD-10-CM | POA: Diagnosis not present

## 2023-06-23 DIAGNOSIS — M898X9 Other specified disorders of bone, unspecified site: Secondary | ICD-10-CM | POA: Diagnosis not present

## 2023-06-23 DIAGNOSIS — I129 Hypertensive chronic kidney disease with stage 1 through stage 4 chronic kidney disease, or unspecified chronic kidney disease: Secondary | ICD-10-CM | POA: Diagnosis not present

## 2023-06-23 DIAGNOSIS — D631 Anemia in chronic kidney disease: Secondary | ICD-10-CM | POA: Diagnosis not present

## 2023-06-23 DIAGNOSIS — N1832 Chronic kidney disease, stage 3b: Secondary | ICD-10-CM | POA: Diagnosis not present

## 2023-06-25 ENCOUNTER — Encounter: Payer: Self-pay | Admitting: Primary Care

## 2023-06-25 ENCOUNTER — Ambulatory Visit: Payer: Medicare HMO | Admitting: Primary Care

## 2023-06-25 ENCOUNTER — Ambulatory Visit (INDEPENDENT_AMBULATORY_CARE_PROVIDER_SITE_OTHER): Payer: Medicare HMO

## 2023-06-25 VITALS — BP 130/68 | HR 77 | Temp 98.1°F | Ht 64.0 in | Wt 105.8 lb

## 2023-06-25 DIAGNOSIS — R918 Other nonspecific abnormal finding of lung field: Secondary | ICD-10-CM | POA: Diagnosis not present

## 2023-06-25 DIAGNOSIS — J9601 Acute respiratory failure with hypoxia: Secondary | ICD-10-CM | POA: Diagnosis not present

## 2023-06-25 DIAGNOSIS — J189 Pneumonia, unspecified organism: Secondary | ICD-10-CM | POA: Diagnosis not present

## 2023-06-25 DIAGNOSIS — J849 Interstitial pulmonary disease, unspecified: Secondary | ICD-10-CM

## 2023-06-25 DIAGNOSIS — K222 Esophageal obstruction: Secondary | ICD-10-CM

## 2023-06-25 NOTE — Patient Instructions (Addendum)
- We will get a chest x-ray today to follow-up on the right middle lobe pneumonia.  Blood cultures were negative  - I will check with Dr. Isaiah Serge on timing of follow-up HRCT and pulmonary function testing - Keep appointment with GI in September due to concern for chronic aspiration - We may want to consider prophylactic antibiotics due to recurrent pneumonia at follow-up depending on GI findings - Continue to take Prilosec 20 mg twice daily before meals and follow aspiration precautions - Continue supplemental oxygen with exertion to maintain O2 >88-90% - Wse will check ONO on RA, if normal can discontinue oxygen   Orders: - CXR today  - Ambulatory walk  - ONO on room air / note she does have cold hands and O2 usually done with forehead probe  Follow-up: - August with Dr. Isaiah Serge   Aspiration Precautions, Adult Aspiration is when a person breathes in (inhales) a liquid or other material, and it goes into the lungs. Adults who have conditions that affect their brain or spinal cord or who have trouble swallowing, a decreased gag reflex, or trouble moving around (mobility) are at risk for this condition. Things that can be inhaled into the lungs include: Food. Any type of liquid. This includes drinks and saliva. Stomach contents. This includes vomit and stomach acid. This condition can cause an infection in the lungs (pneumonia). Certain steps can be taken to reduce the risk of aspiration. What are the signs of aspiration? Signs may include: Coughing. A person may: Cough after they swallow food or liquids. Cough up mucus (sputum) that is yellow, tan, or green. It may also smell bad or have pieces of food in it. Cough when lying down or have to sit up quickly after lying down. Have a hoarse, barky cough. Trouble breathing. This may include: Breathing very quickly or very slowly. Loud breathing. High-pitched whistling sounds during breathing, most often when breathing out  (wheezing). Trouble eating. This may involve: Clearing the throat often while eating. Drooling while eating. Having a feeling of fullness in the throat or like something is stuck in the throat. Having a runny nose and watery eyes while eating. Speaking problems. This may include a hoarse voice or inability to speak. Choking often while eating. Changes in skin color. The skin may look red or blue. Pain in the chest or back during or right after eating. What problems can aspiration cause? This condition can cause problems such as: Losing weight because the person is not absorbing the nutrients they need. Loss of enjoyment and the social benefits of eating. Choking. Lung irritation. This can happen if someone aspirates acidic food or drinks. Pneumonia. Lung abscess. This is a collection of infected liquid (pus) in the lungs. In serious cases, death can occur. What can I do to prevent aspiration? Caring for someone who has a feeding tube If you are caring for someone who has a feeding tube and cannot eat or drink safely by mouth: Keep the person in an upright position as much as possible. Do not lay the person flat if they get feedings around the clock (continuous feedings). If you need to lay the person flat for any reason, turn the feeding pump off. Check for left over liquid (residuals) in the stomach as told by the health care provider. Ask the health care provider what residual amount is too high. Caring for someone who can eat and drink safely by mouth If you are caring for someone who can eat and drink safely  by mouth: Have the person sit in an upright position when they eat or drink. This can be done by: Having the person sit up in a chair. Positioning the person in bed so they are upright. This can be done if sitting in a chair is not possible. Remind the person to eat slowly and chew well. Make sure the person is awake and alert while eating. Never put food or liquids in the mouth  of a person who is not fully alert. Do not distract the person, especially if they have problems with thinking or memory (cognitive problems). Allow foods to cool. Hot foods may be harder to swallow. Provide small meals often rather than three large meals. This may help the person to not feel tired when eating. After the person is done eating: Check their mouth well for leftover food. Keep them sitting upright for 30-45 minutes. Do not serve food or drink within 2 hours of bedtime. General instructions To prevent aspiration in someone who can eat and drink safely by mouth: Feed small bites of food. Do not force-feed. If the person is on a diet because of problems with swallowing (dysphagia diet), follow the food and drink consistency that the health care provider recommends. You may be told to thicken a liquid using a thickening product. Use as little water as possible when brushing the person's teeth or cleaning their mouth. Provide oral care before and after meals. Use adaptive devices as told by the health care provider. These may include cut-out cups or other utensils. Crush pills and put them in soft food, such as pudding or ice cream. Some pills should not be crushed. Check with the health care provider before crushing any medicine. If the person is choking on food or an object, do abdominal thrusts.  Contact a health care provider if: The person has a feeding tube, and the feeding tube residual amount is too high. The person tries to avoid food or water. They may refuse to eat, drink, or be fed, or they may eat less than normal. The person may have aspirated food or liquid. The person shows warning signs of aspiration. These include choking or coughing when they eat or drink. The person has symptoms of pneumonia. These may include: Coughing a lot or coughing up mucus with a bad smell or blood in it. A long-lasting (chronic) cough. Shortness of breath or wheezing. Chest or back  pain. Sweating, fever, or chills. Get help right away if: The person has trouble breathing or starts to breathe fast. The person is breathing very slowly or stops breathing. The person cannot stop choking. The person turns blue, faints, or seems confused. These symptoms may be an emergency. Get help right away. Call 911. Do not wait to see if the symptoms will go away. This information is not intended to replace advice given to you by your health care provider. Make sure you discuss any questions you have with your health care provider. Document Revised: 05/27/2022 Document Reviewed: 05/27/2022 Elsevier Patient Education  2024 [employer].

## 2023-06-25 NOTE — Progress Notes (Signed)
@Patient  ID: [name], female    DOB: Dec 08, 1956, 67 y.o.   MRN: [id number]  Chief Complaint  Patient presents with   Hospitalization Follow-up    Doing well.      Referring provider: Genia Del  HPI: 67 year old female, never smoked. PMH significant for asthma, recurrent pneumonia, sinobronchitis, scleroderma, Raynaud's disease.  Patient of Dr. Isaiah Serge, last seen 05/07/23.  Follows with Dr. Deanne Coffer with rheumatology.  CT imaging with waxing and waning groundglass opacities in upper lobes with mild reticulation likely secondary to ILD from scleroderma and suspected chronic aspiration.  Referred to GI and advised to continue PPI.  06/25/2023 Presents today for hospital follow-up CAP. She was admitted from 05/16/2023 - 05/17/2023 for community-acquired pneumonia. CXR showed new RML infiltrate.  For rhinovirus.  Received ceftriaxone and azithromycin.  Discharged on amoxicillin and azithromycin.  She is feeling better since being discharged. She was sent home on oxygen. No fevers, cough or mucus production. Needs repeat imaging today.  Allergies  Allergen Reactions   Avelox [Moxifloxacin Hcl In Nacl]     Caused tachycardia   Bactrim [Sulfamethoxazole-Trimethoprim]    Codeine Hives and Other (See Comments)    Hallucination,     Immunization History  Administered Date(s) Administered   Fluad Quad(high Dose 65+) 12/02/2020, 10/30/2022   H1N1 12/25/2008   Influenza Split 11/12/2015, 11/07/2016   Influenza Whole 09/27/2009   Influenza,inj,Quad PF,6+ Mos 10/30/2017, 10/29/2018, 10/27/2019   Influenza-Unspecified 12/28/2021   PFIZER(Purple Top)SARS-COV-2 Vaccination 04/05/2020, 05/03/2020, 09/05/2020   PNEUMOCOCCAL CONJUGATE-20 01/15/2023   Pneumococcal Polysaccharide-23 12/04/2011   Tdap 05/16/2023    Past Medical History:  Diagnosis Date   Allergic rhinitis    Asthma    Atherosclerosis    Chronic kidney disease (CKD), stage III (moderate) (HCC)    Esophageal  stricture    GERD (gastroesophageal reflux disease)    History of anemia due to chronic kidney disease    History of colitis 2017   Hypertension    Metabolic bone disease    Raynaud disease    Scleroderma (HCC)     Tobacco History: Social History   Tobacco Use  Smoking Status Never  Smokeless Tobacco Never   Counseling given: Not Answered   Outpatient Medications Prior to Visit  Medication Sig Dispense Refill   acetaminophen (TYLENOL) 325 MG tablet Take 325 mg by mouth every 6 (six) hours as needed for mild pain.     albuterol (PROVENTIL) (2.5 MG/3ML) 0.083% nebulizer solution Take 3 mLs (2.5 mg total) by nebulization every 6 (six) hours as needed for wheezing or shortness of breath. 75 mL 3   albuterol (VENTOLIN HFA) 108 (90 Base) MCG/ACT inhaler Inhale 2 puffs into the lungs every 6 (six) hours as needed for wheezing or shortness of breath. 8 g 3   amLODipine (NORVASC) 5 MG tablet Take 5 mg by mouth daily.      Cholecalciferol (VITAMIN D3) 125 MCG (5000 UT) CAPS Take 1 capsule (5,000 Units total) by mouth daily. (Patient taking differently: Take 5,000 Units by mouth 3 (three) times a week.) 90 capsule 1   diphenhydrAMINE (BENADRYL) 25 mg capsule Take 25 mg by mouth every 6 (six) hours as needed for allergies.     fluticasone (FLONASE) 50 MCG/ACT nasal spray Place 1 spray into both nostrils daily.     fluticasone furoate-vilanterol (BREO ELLIPTA) 200-25 MCG/ACT AEPB Inhale 1 puff into the lungs daily. 180 each 1   HYDROcodone-acetaminophen (NORCO) 5-325 MG tablet Take 1 tablet  by mouth 2 (two) times daily as needed. 12 tablet 0   levothyroxine (SYNTHROID) 75 MCG tablet Take 1 tablet (75 mcg total) by mouth daily. 30 tablet 1   lisinopril (ZESTRIL) 5 MG tablet Take 5 mg by mouth daily.     omeprazole (PRILOSEC) 20 MG capsule Take 20 mg by mouth 2 (two) times daily before a meal.     pseudoephedrine (SUDAFED) 120 MG 12 hr tablet Take 120 mg by mouth daily.     No  facility-administered medications prior to visit.   Review of Systems  Review of Systems  Constitutional:  Negative for chills, fatigue and fever.  HENT: Negative.    Respiratory:  Negative for cough and shortness of breath.   Cardiovascular: Negative.    Physical Exam  BP 130/68 (BP Location: Right Arm, Patient Position: Sitting, Cuff Size: Normal)   Pulse 77   Temp 98.1 F (36.7 C) (Oral)   Ht 5\' 4"  (1.626 m)   Wt 105 lb 12.8 oz (48 kg)   SpO2 99%   BMI 18.16 kg/m  Physical Exam Constitutional:      Appearance: Normal appearance.  HENT:     Head: Normocephalic and atraumatic.  Cardiovascular:     Rate and Rhythm: Normal rate and regular rhythm.  Pulmonary:     Effort: Pulmonary effort is normal.     Breath sounds: Normal breath sounds.  Musculoskeletal:        General: Normal range of motion.  Skin:    General: Skin is warm and dry.  Neurological:     General: No focal deficit present.     Mental Status: She is alert and oriented to person, place, and time. Mental status is at baseline.  Psychiatric:        Mood and Affect: Mood normal.        Behavior: Behavior normal.        Thought Content: Thought content normal.        Judgment: Judgment normal.      Lab Results:  CBC    Component Value Date/Time   WBC 8.9 06/04/2023 1447   WBC 15.8 (H) 05/17/2023 0439   RBC 3.23 (L) 06/04/2023 1447   RBC 3.42 (L) 05/17/2023 0439   HGB 9.7 (L) 06/04/2023 1447   HCT 29.3 (L) 06/04/2023 1447   PLT 246 06/04/2023 1447   MCV 91 06/04/2023 1447   MCH 30.0 06/04/2023 1447   MCH 29.8 05/17/2023 0439   MCHC 33.1 06/04/2023 1447   MCHC 31.4 05/17/2023 0439   RDW 12.3 06/04/2023 1447   LYMPHSABS 1.5 05/16/2023 0758   LYMPHSABS 1.7 11/28/2020 1129   MONOABS 1.9 (H) 05/16/2023 0758   EOSABS 0.2 05/16/2023 0758   EOSABS 0.6 (H) 11/28/2020 1129   BASOSABS 0.0 05/16/2023 0758   BASOSABS 0.1 11/28/2020 1129    BMET    Component Value Date/Time   NA 129 (L) 06/04/2023  1447   K 5.0 06/04/2023 1447   CL 93 (L) 06/04/2023 1447   CO2 24 06/04/2023 1447   GLUCOSE 125 (H) 06/04/2023 1447   GLUCOSE 126 (H) 05/17/2023 0439   BUN 24 06/04/2023 1447   CREATININE 1.49 (H) 06/04/2023 1447   CALCIUM 8.8 06/04/2023 1447   GFRNONAA 42 (L) 05/17/2023 0439   GFRAA 47 (L) 11/28/2020 1129    BNP    Component Value Date/Time   BNP 137.0 (H) 12/01/2019 1841    ProBNP    Component Value Date/Time   PROBNP >3200.0 (  H) 02/04/2010 0540    Imaging: No results found.   Assessment & Plan:   CAP (community acquired pneumonia) - Admitted from 05/16/2023 - 05/17/2023 for community-acquired pneumonia. CXR showed new RML infiltrate.  Cultures were negative.  Completed course of amoxicillin and azithromycin.  Clinically better since discharge.  No active cough or mucus production.  Needs repeat chest x-ray today.   GERD (gastroesophageal reflux disease) - Continue Prilosec 20 mg twice daily before meals and follow aspiration precautions  ESOPHAGEAL STRICTURE - Needs to keep appointment with GI in September due to concern for chronic aspiration  ILD (interstitial lung disease) (HCC) - Follow up in August with Dr. Isaiah Serge, needs HRCT and repeat PFTs  Acute respiratory failure (HCC) - Started on oxygen following admission in May for pneumonia. Patient also has underlying ILD. Continue supplemental oxygen with exertion to maintain O2 >88-90%. She did not desaturated on simple walk test today in office. Check ONO on RA to see if we can discontinue oxygen.    - We will get a chest x-ray today to follow-up on the right middle lobe pneumonia.  Blood cultures were negative  - I will check with Dr. Isaiah Serge on timing of follow-up HRCT and pulmonary function testing - Keep appointment with GI in September due to concern for chronic aspiration - We may want to consider prophylactic antibiotics due to recurrent pneumonia at follow-up depending on GI findings - Continue to take  Prilosec 20 mg twice daily before meals and follow aspiration precautions - Continue supplemental oxygen with exertion to maintain O2 >88-90% - We will check ONO on RA, if normal can discontinue oxygen   Orders: - CXR today  - Ambulatory walk  - ONO on room air / note she does have cold hands and O2 usually done with forehead probe  Follow-up: - August with Dr. Markus Daft, NP 08/28/2023

## 2023-07-05 DIAGNOSIS — J9601 Acute respiratory failure with hypoxia: Secondary | ICD-10-CM | POA: Diagnosis not present

## 2023-07-12 ENCOUNTER — Telehealth: Payer: Self-pay | Admitting: Primary Care

## 2023-07-12 NOTE — Telephone Encounter (Signed)
ONO on 07/05/23 on room air showed patient spent an hour and 4 seconds with SpO2 less than 88%.  SpO2 low 76% with baseline 91%.  Please let her know that she needs to continue to wear 2 L of oxygen at bedtime.

## 2023-07-27 ENCOUNTER — Encounter: Payer: Self-pay | Admitting: Primary Care

## 2023-08-03 NOTE — Telephone Encounter (Signed)
Called patient.  Reviewed information.  Patient in agreement to wear oxygen 2L at bedtime.  Patient verbalized understanding.  Nothing further needed at this time.

## 2023-08-06 ENCOUNTER — Ambulatory Visit: Payer: Medicare HMO | Admitting: Student

## 2023-08-13 DIAGNOSIS — H18593 Other hereditary corneal dystrophies, bilateral: Secondary | ICD-10-CM | POA: Diagnosis not present

## 2023-08-13 DIAGNOSIS — H43812 Vitreous degeneration, left eye: Secondary | ICD-10-CM | POA: Diagnosis not present

## 2023-08-13 DIAGNOSIS — H25013 Cortical age-related cataract, bilateral: Secondary | ICD-10-CM | POA: Diagnosis not present

## 2023-08-13 DIAGNOSIS — H35363 Drusen (degenerative) of macula, bilateral: Secondary | ICD-10-CM | POA: Diagnosis not present

## 2023-08-13 DIAGNOSIS — H2513 Age-related nuclear cataract, bilateral: Secondary | ICD-10-CM | POA: Diagnosis not present

## 2023-08-13 DIAGNOSIS — H5213 Myopia, bilateral: Secondary | ICD-10-CM | POA: Diagnosis not present

## 2023-08-28 DIAGNOSIS — J9600 Acute respiratory failure, unspecified whether with hypoxia or hypercapnia: Secondary | ICD-10-CM | POA: Insufficient documentation

## 2023-08-28 NOTE — Assessment & Plan Note (Addendum)
-   Admitted from 05/16/2023 - 05/17/2023 for community-acquired pneumonia. CXR showed new RML infiltrate.  Cultures were negative.  Completed course of amoxicillin and azithromycin.  Clinically better since discharge.  No active cough or mucus production.  Needs repeat chest x-ray today.

## 2023-08-28 NOTE — Assessment & Plan Note (Addendum)
-   Started on oxygen following admission in May for pneumonia. Patient also has underlying ILD. Continue supplemental oxygen with exertion to maintain O2 >88-90%. She did not desaturated on simple walk test today in office. Check ONO on RA to see if we can discontinue oxygen.

## 2023-08-28 NOTE — Assessment & Plan Note (Signed)
-   Continue Prilosec 20 mg twice daily before meals and follow aspiration precautions

## 2023-08-28 NOTE — Assessment & Plan Note (Signed)
-   Needs to keep appointment with GI in September due to concern for chronic aspiration

## 2023-08-28 NOTE — Assessment & Plan Note (Addendum)
-   Follow up in August with Dr. Isaiah Serge, needs HRCT and repeat PFTs

## 2023-09-09 ENCOUNTER — Other Ambulatory Visit: Payer: Self-pay | Admitting: Medical

## 2023-09-15 ENCOUNTER — Encounter: Payer: Self-pay | Admitting: Physician Assistant

## 2023-09-15 ENCOUNTER — Ambulatory Visit: Payer: Medicare HMO | Attending: Student | Admitting: Physician Assistant

## 2023-09-15 VITALS — BP 116/60 | HR 70 | Ht 64.0 in | Wt 107.0 lb

## 2023-09-15 DIAGNOSIS — R55 Syncope and collapse: Secondary | ICD-10-CM | POA: Diagnosis not present

## 2023-09-15 DIAGNOSIS — I10 Essential (primary) hypertension: Secondary | ICD-10-CM

## 2023-09-15 DIAGNOSIS — I351 Nonrheumatic aortic (valve) insufficiency: Secondary | ICD-10-CM | POA: Diagnosis not present

## 2023-09-15 NOTE — Progress Notes (Unsigned)
Cardiology Office Note:  .   Date:  09/16/2023  ID:  [name], DOB 04/20/1956, MRN [id number] PCP: Genia Del  [facility] Providers Cardiologist:  Nanetta Batty, MD     History of Present Illness: .   [name] is a 67 y.o. female with past medical history of hypertension, CKD, asthma, esophageal stricture, GERD, Raynaud's disease, scleroderma, interstitial lung disease and aortic insufficiency.  Echocardiogram in June 2020 showed trace AI.  She was last seen by Dr. Gery Pray in July 2022 at which time she was doing well, it was recommended she follow-up only as needed.  Repeat echocardiogram obtained on 08/08/2021 showed EF of 60 to 65%, RVSP 24.3 mmHg, mild AI.  She has been followed by Dr. Deanne Coffer of rheumatology service for her Raynaud's disease and scleroderma.  She is being followed by Dr. Isaiah Serge of pulmonology service for her interstitial lung disease.  High-resolution CT obtained in February 2024 showed a lower lung predominant fibrotic interstitial lung disease which is stable, resolution of the left upper lobe groundglass opacity.  PFT obtained in May 2024 showed moderate diffusion defect, mild diffusion impairment.  She has a history of esophageal stricture and esophageal dysfunction followed by Dr. Marina Goodell of GI service.  Patient was last admitted in the hospital in May 2024 due to sepsis from pneumonia.  She had fever, leukocytosis and tachypnea.  Patient was treated with steroid and antibiotic.  She was positive for rhinovirus.  Echocardiogram obtained on 05/17/2023 showed EF 60 to 65%, normal RV, mild LAE, trivial MR, trivial AI, borderline dilatation of the ascending aorta measuring at 38 mm.  She did have a syncopal event, however this was felt to have occurred in the setting of infection.  She presents today for cardiology follow-up.  She has a few questions.  Since her admission in May, she has not had any recurrent syncope.  She denies any chest pain.  She  says when she had the PFT done by pulmonology service, she was told she has A-fib, however I looked at every single EKG from 2024 all the way dating back to 2020, I could not locate any record of A-fib.  She also mention her echocardiogram in the hospital showed mildly elevated pulmonary artery systolic pressure.  This is not very surprising given the fact that patient had pneumonia and has underlying interstitial lung disease at the time.  I reassured the patient.  She says she has occasional skipped heartbeat that would last a few seconds each time.  She has not had any sustained irregular rhythm.  Her symptom may occurs once every few month.  In this case, I did not recommend any further workup.  She can follow-up with cardiology service on a as needed basis.  ROS:   She denies chest pain, palpitations, dyspnea, pnd, orthopnea, n, v, dizziness, syncope, edema, weight gain, or early satiety. All other systems reviewed and are otherwise negative except as noted above.    Studies Reviewed: [name]   EKG Interpretation Date/Time:  Wednesday September 15 2023 15:05:09 EDT Ventricular Rate:  70 PR Interval:  178 QRS Duration:  92 QT Interval:  382 QTC Calculation: 412 R Axis:   -49  Text Interpretation: Normal sinus rhythm Left axis deviation Cannot rule out Anterior infarct , age undetermined Confirmed by Azalee Course [phone]) on 09/16/2023 8:59:35 PM     Risk Assessment/Calculations:             Physical Exam:   VS:  BP 116/60 (BP Location: Right Arm, Patient Position: Sitting, Cuff Size: Normal)   Pulse 70   Ht 5\' 4"  (1.626 m)   Wt 107 lb (48.5 kg)   BMI 18.37 kg/m    Wt Readings from Last 3 Encounters:  09/15/23 107 lb (48.5 kg)  06/25/23 105 lb 12.8 oz (48 kg)  06/04/23 106 lb (48.1 kg)    GEN: Well nourished, well developed in no acute distress NECK: No JVD; No carotid bruits CARDIAC: RRR, no murmurs, rubs, gallops RESPIRATORY:  Clear to auscultation without rales, wheezing or rhonchi   ABDOMEN: Soft, non-tender, non-distended EXTREMITIES:  No edema; No deformity   ASSESSMENT AND PLAN: .    Syncope: Solitary episode occurred prior to recent admission in May 2024.  This occurred in the setting of sepsis and rhinovirus infection.  He has not had any recurrence of symptoms since.  Suspicion for arrhythmia leading to the syncope was very low.  Will hold off on additional workup given lack of recurrence for the past 4 months  Hypertension: Blood pressure well-controlled  Aortic insufficiency recent echocardiogram showed trivial AI.       Dispo: Follow-up with cardiology service on as-needed basis.  Signed, Azalee Course, PA

## 2023-09-15 NOTE — Patient Instructions (Signed)
Medication Instructions:  NO CHANGES *If you need a refill on your cardiac medications before your next appointment, please call your pharmacy*   Lab Work: NO LAB If you have labs (blood work) drawn today and your tests are completely normal, you will receive your results only by: MyChart Message (if you have MyChart) OR A paper copy in the mail If you have any lab test that is abnormal or we need to change your treatment, we will call you to review the results.   Testing/Procedures: NO TESTING   Follow-Up: At [facility], you and your health needs are our priority.  As part of our continuing mission to provide you with exceptional heart care, we have created designated Provider Care Teams.  These Care Teams include your primary Cardiologist (physician) and Advanced Practice Providers (APPs -  Physician Assistants and Nurse Practitioners) who all work together to provide you with the care you need, when you need it.   Your next appointment:   FOLLOW UP AS NEEDED.

## 2023-09-24 ENCOUNTER — Encounter: Payer: Self-pay | Admitting: Internal Medicine

## 2023-09-24 ENCOUNTER — Ambulatory Visit: Payer: Medicare HMO | Admitting: Internal Medicine

## 2023-09-24 VITALS — BP 90/60 | Ht 64.0 in | Wt 107.1 lb

## 2023-09-24 DIAGNOSIS — K222 Esophageal obstruction: Secondary | ICD-10-CM | POA: Diagnosis not present

## 2023-09-24 DIAGNOSIS — K2100 Gastro-esophageal reflux disease with esophagitis, without bleeding: Secondary | ICD-10-CM | POA: Diagnosis not present

## 2023-09-24 DIAGNOSIS — M349 Systemic sclerosis, unspecified: Secondary | ICD-10-CM

## 2023-09-24 DIAGNOSIS — R1319 Other dysphagia: Secondary | ICD-10-CM | POA: Diagnosis not present

## 2023-09-24 NOTE — Patient Instructions (Signed)
You have been scheduled for an endoscopy. Please follow written instructions given to you at your visit today.  If you use inhalers (even only as needed), please bring them with you on the day of your procedure.  If you take any of the following medications, they will need to be adjusted prior to your procedure:   DO NOT TAKE 7 DAYS PRIOR TO TEST- Trulicity (dulaglutide) Ozempic, Wegovy (semaglutide) Mounjaro (tirzepatide) Bydureon Bcise (exanatide extended release)  DO NOT TAKE 1 DAY PRIOR TO YOUR TEST Rybelsus (semaglutide) Adlyxin (lixisenatide) Victoza (liraglutide) Byetta (exanatide) ___________________________________________________________________________    _______________________________________________________  If your blood pressure at your visit was 140/90 or greater, please contact your primary care physician to follow up on this.  _______________________________________________________  If you are age 67 or older, your body mass index should be between 23-30. Your Body mass index is 18.39 kg/m. If this is out of the aforementioned range listed, please consider follow up with your Primary Care Provider.  If you are age 3 or younger, your body mass index should be between 19-25. Your Body mass index is 18.39 kg/m. If this is out of the aformentioned range listed, please consider follow up with your Primary Care Provider.   ________________________________________________________  The [facility] providers would like to encourage you to use [facility] to communicate with providers for non-urgent requests or questions.  Due to long hold times on the telephone, sending your provider a message by [facility] may be a faster and more efficient way to get a response.  Please allow 48 business hours for a response.  Please remember that this is for non-urgent requests.  _______________________________________________________

## 2023-09-24 NOTE — Progress Notes (Signed)
HISTORY OF PRESENT ILLNESS:  [name] is a 67 y.o. female with multiple medical problems as listed below including scleroderma, GERD with erosive esophagitis and peptic stricture requiring esophageal dilation, ischemic colitis with colonoscopy 2017, and asthma.  She presents today with a chief complaint of solid food dysphagia and abnormal CT scan.  In February 2024 she underwent CT scan of the chest regarding her lung disease.  She was incidentally noted to have a dilated esophagus with debris.  She also mentions that over the past year she has had worsening intermittent solid food dysphagia.  Needs to make sure her food is chewed very well or quite small.  She denies reflux symptoms.  She currently takes Prilosec 20 mg twice daily.  Has been on this dose for a while.  At the time of her last upper endoscopy in 2012 she was found to have severe stricturing in the distalmost portion as well as erosive change.  She also has a history of ischemic colitis as noted above.  No lower GI complaints at this time.    REVIEW OF SYSTEMS:  All non-GI ROS negative unless otherwise stated in the HPI except for muscle cramps  Past Medical History:  Diagnosis Date   Allergic rhinitis    Asthma    Atherosclerosis    Chronic kidney disease (CKD), stage III (moderate) (HCC)    Esophageal stricture    GERD (gastroesophageal reflux disease)    History of anemia due to chronic kidney disease    History of colitis 2017   Hypertension    Metabolic bone disease    Raynaud disease    Scleroderma (HCC)     Past Surgical History:  Procedure Laterality Date   CESAREAN SECTION     COLONOSCOPY WITH PROPOFOL N/A 07/14/2016   Procedure: COLONOSCOPY WITH PROPOFOL;  Surgeon: Rachael Fee, MD;  Location: [facility];  Service: Endoscopy;  Laterality: N/A;   HAND SURGERY     left; tendon repair   TONSILLECTOMY      Social History KELLSIE GRINDLE  reports that she has never smoked. She has never used  smokeless tobacco. She reports that she does not drink alcohol and does not use drugs.  family history includes COPD in her mother; Cancer in her father and sister; Dementia in her mother; Diabetes in her sister; Heart failure in her mother; Hepatitis in her brother and mother.  Allergies  Allergen Reactions   Avelox [Moxifloxacin Hcl In Nacl]     Caused tachycardia   Bactrim [Sulfamethoxazole-Trimethoprim]    Codeine Hives and Other (See Comments)    Hallucination,        PHYSICAL EXAMINATION: Vital signs: BP 90/60 (BP Location: Left Arm, Patient Position: Sitting, Cuff Size: Normal)   Ht 5\' 4"  (1.626 m)   Wt 107 lb 2 oz (48.6 kg)   BMI 18.39 kg/m   Constitutional: Thin, chronically ill-appearing, scleroderma facies, no acute distress Psychiatric: Pleasant, alert and oriented x3, cooperative Eyes: extraocular movements intact, anicteric, conjunctiva pink Mouth: oral pharynx moist, no lesions.  Overbite Neck: supple no lymphadenopathy Cardiovascular: heart regular rate and rhythm, no murmur Lungs: clear to auscultation bilaterally Abdomen: soft, thin, nontender, nondistended, no obvious ascites, no peritoneal signs, normal bowel sounds, no organomegaly Rectal: Omitted Extremities: no clubbing, cyanosis, or lower extremity edema bilaterally Skin: no lesions on visible extremities Neuro: No focal deficits.  Cranial nerves intact  ASSESSMENT:  1.  Scleroderma esophagus 2.  GERD and peptic stricture secondary to #1 above  3.  Worsening solid food dysphagia secondary to worsening stricturing of the esophagus 4.  Abnormal CT scan as described 5.  History of ischemic colitis 6.  Multiple medical problems.  Stable   PLAN:  1.  Continue Prilosec 20 mg p.o. twice daily.  We may need to adjust this dose depending upon endoscopic findings 2.  Schedule upper endoscopy with esophageal dilation.  Patient is higher than baseline risk due to her comorbidities.The nature of the procedure,  as well as the risks, benefits, and alternatives were carefully and thoroughly reviewed with the patient. Ample time for discussion and questions allowed. The patient understood, was satisfied, and agreed to proceed. 3.  Ongoing general medical care with PCP and other specialists

## 2023-10-08 ENCOUNTER — Encounter: Payer: Self-pay | Admitting: Internal Medicine

## 2023-10-21 ENCOUNTER — Ambulatory Visit: Payer: Medicare HMO | Admitting: Internal Medicine

## 2023-10-21 ENCOUNTER — Encounter: Payer: Self-pay | Admitting: Internal Medicine

## 2023-10-21 VITALS — BP 111/62 | HR 82 | Temp 98.6°F | Resp 21 | Ht 64.0 in | Wt 107.0 lb

## 2023-10-21 DIAGNOSIS — N183 Chronic kidney disease, stage 3 unspecified: Secondary | ICD-10-CM | POA: Diagnosis not present

## 2023-10-21 DIAGNOSIS — K317 Polyp of stomach and duodenum: Secondary | ICD-10-CM | POA: Diagnosis not present

## 2023-10-21 DIAGNOSIS — K222 Esophageal obstruction: Secondary | ICD-10-CM | POA: Diagnosis not present

## 2023-10-21 DIAGNOSIS — K311 Adult hypertrophic pyloric stenosis: Secondary | ICD-10-CM

## 2023-10-21 DIAGNOSIS — I10 Essential (primary) hypertension: Secondary | ICD-10-CM | POA: Diagnosis not present

## 2023-10-21 DIAGNOSIS — M349 Systemic sclerosis, unspecified: Secondary | ICD-10-CM

## 2023-10-21 DIAGNOSIS — R1319 Other dysphagia: Secondary | ICD-10-CM

## 2023-10-21 DIAGNOSIS — K2100 Gastro-esophageal reflux disease with esophagitis, without bleeding: Secondary | ICD-10-CM | POA: Diagnosis not present

## 2023-10-21 DIAGNOSIS — R1310 Dysphagia, unspecified: Secondary | ICD-10-CM | POA: Diagnosis not present

## 2023-10-21 MED ORDER — SODIUM CHLORIDE 0.9 % IV SOLN: 500.0000 mL | INTRAVENOUS | Status: DC

## 2023-10-21 NOTE — Progress Notes (Signed)
Expand All Collapse All HISTORY OF PRESENT ILLNESS:   [name] is a 67 y.o. female with multiple medical problems as listed below including scleroderma, GERD with erosive esophagitis and peptic stricture requiring esophageal dilation, ischemic colitis with colonoscopy 2017, and asthma.  She presents today with a chief complaint of solid food dysphagia and abnormal CT scan.   In February 2024 she underwent CT scan of the chest regarding her lung disease.  She was incidentally noted to have a dilated esophagus with debris.  She also mentions that over the past year she has had worsening intermittent solid food dysphagia.  Needs to make sure her food is chewed very well or quite small.  She denies reflux symptoms.  She currently takes Prilosec 20 mg twice daily.  Has been on this dose for a while.  At the time of her last upper endoscopy in 2012 she was found to have severe stricturing in the distalmost portion as well as erosive change.   She also has a history of ischemic colitis as noted above.  No lower GI complaints at this time.       REVIEW OF SYSTEMS:   All non-GI ROS negative unless otherwise stated in the HPI except for muscle cramps       Past Medical History:  Diagnosis Date   Allergic rhinitis     Asthma     Atherosclerosis     Chronic kidney disease (CKD), stage III (moderate) (HCC)     Esophageal stricture     GERD (gastroesophageal reflux disease)     History of anemia due to chronic kidney disease     History of colitis 2017   Hypertension     Metabolic bone disease     Raynaud disease     Scleroderma (HCC)                 Past Surgical History:  Procedure Laterality Date   CESAREAN SECTION       COLONOSCOPY WITH PROPOFOL N/A 07/14/2016    Procedure: COLONOSCOPY WITH PROPOFOL;  Surgeon: Rachael Fee, MD;  Location: [facility];  Service: Endoscopy;  Laterality: N/A;   HAND SURGERY        left; tendon repair   TONSILLECTOMY              Social  History [name]  reports that she has never smoked. She has never used smokeless tobacco. She reports that she does not drink alcohol and does not use drugs.   family history includes COPD in her mother; Cancer in her father and sister; Dementia in her mother; Diabetes in her sister; Heart failure in her mother; Hepatitis in her brother and mother.   Allergies       Allergies  Allergen Reactions   Avelox [Moxifloxacin Hcl In Nacl]        Caused tachycardia   Bactrim [Sulfamethoxazole-Trimethoprim]     Codeine Hives and Other (See Comments)      Hallucination,             PHYSICAL EXAMINATION: Vital signs: BP 90/60 (BP Location: Left Arm, Patient Position: Sitting, Cuff Size: Normal)   Ht 5\' 4"  (1.626 m)   Wt 107 lb 2 oz (48.6 kg)   BMI 18.39 kg/m   Constitutional: Thin, chronically ill-appearing, scleroderma facies, no acute distress Psychiatric: Pleasant, alert and oriented x3, cooperative Eyes: extraocular movements intact, anicteric, conjunctiva pink Mouth: oral pharynx moist, no lesions.  Overbite Neck: supple no  lymphadenopathy Cardiovascular: heart regular rate and rhythm, no murmur Lungs: clear to auscultation bilaterally Abdomen: soft, thin, nontender, nondistended, no obvious ascites, no peritoneal signs, normal bowel sounds, no organomegaly Rectal: Omitted Extremities: no clubbing, cyanosis, or lower extremity edema bilaterally Skin: no lesions on visible extremities Neuro: No focal deficits.  Cranial nerves intact   ASSESSMENT:   1.  Scleroderma esophagus 2.  GERD and peptic stricture secondary to #1 above 3.  Worsening solid food dysphagia secondary to worsening stricturing of the esophagus 4.  Abnormal CT scan as described 5.  History of ischemic colitis 6.  Multiple medical problems.  Stable     PLAN:   1.  Continue Prilosec 20 mg p.o. twice daily.  We may need to adjust this dose depending upon endoscopic findings 2.  Schedule upper endoscopy  with esophageal dilation.  Patient is higher than baseline risk due to her comorbidities.The nature of the procedure, as well as the risks, benefits, and alternatives were carefully and thoroughly reviewed with the patient. Ample time for discussion and questions allowed. The patient understood, was satisfied, and agreed to proceed. 3.  Ongoing general medical care with PCP and other specialists

## 2023-10-21 NOTE — Op Note (Signed)
[facility] Patient Name: [name] Procedure Date: 10/21/2023 4:05 PM MRN: [id number] Endoscopist: Wilhemina Bonito. Marina Goodell , MD, [id number] Age: 67 Referring MD:  Date of Birth: Feb 24, 1956 Gender: Female Account #: [id number] Procedure:                Upper GI endoscopy with balloon dilation of the                            esophagus?"20 mm max; biopsies Indications:              Dysphagia, Therapeutic procedure, Esophageal reflux Medicines:                Monitored Anesthesia Care Procedure:                Pre-Anesthesia Assessment:                           - Prior to the procedure, a History and Physical                            was performed, and patient medications and                            allergies were reviewed. The patient's tolerance of                            previous anesthesia was also reviewed. The risks                            and benefits of the procedure and the sedation                            options and risks were discussed with the patient.                            All questions were answered, and informed consent                            was obtained. Prior Anticoagulants: The patient has                            taken no anticoagulant or antiplatelet agents. ASA                            Grade Assessment: II - A patient with mild systemic                            disease. After reviewing the risks and benefits,                            the patient was deemed in satisfactory condition to                            undergo the procedure.  After obtaining informed consent, the endoscope was                            passed under direct vision. Throughout the                            procedure, the patient's blood pressure, pulse, and                            oxygen saturations were monitored continuously. The                            Olympus Scope SN [id number] was introduced through the                             mouth, and advanced to the second part of duodenum.                            The upper GI endoscopy was accomplished without                            difficulty. The patient tolerated the procedure                            well. Scope In: Scope Out: Findings:                 The esophagus was dilated with decreased motility                            and copious secretions. No esophagitis..                           One benign-appearing,, ringlike, intrinsic moderate                            stenosis was found 40 cm from the incisors. This                            stenosis measured 1.4 cm (inner diameter). A TTS                            dilator was passed through the scope. Dilation with                            an 18-19-20 mm balloon dilator was performed to 20                            mm. The ringlike stricture was disrupted.                           The stomach revealed a small hiatal hernia. Few  small polyps. 1 larger (15 mm) pedunculated polyp.                            This was biopsied with a cold forceps for histology.                           The examined duodenum was normal.                           The cardia and gastric fundus were normal on                            retroflexion. Complications:            No immediate complications. Estimated Blood Loss:     Estimated blood loss: none. Impression:               1. Scleroderma esophagus                           2. Benign distal esophageal stricture status post                            dilation                           3. Gastric polyp. Biopsied                           4. Otherwise normal exam. Recommendation:           - Patient has a contact number available for                            emergencies. The signs and symptoms of potential                            delayed complications were discussed with the                            patient. Return to  normal activities tomorrow.                            Written discharge instructions were provided to the                            patient.                           - Post dilation diet.                           - Continue present medications.                           - Await pathology results.                           -  Office follow-up with Dr. Marina Goodell in 6 to 8 weeks Wilhemina Bonito. Marina Goodell, MD 10/21/2023 4:29:52 PM This report has been signed electronically.

## 2023-10-21 NOTE — Progress Notes (Signed)
Sedate, gd SR, tolerated procedure well, VSS, report to RN 

## 2023-10-21 NOTE — Progress Notes (Signed)
Called to room to assist during endoscopic procedure.  Patient ID and intended procedure confirmed with present staff. Received instructions for my participation in the procedure from the performing physician.  

## 2023-10-21 NOTE — Patient Instructions (Signed)
-   Post dilation diet. - Continue present medications. - Await pathology results. - Office follow-up with Dr. Marina Goodell in 6 to 8 weeks   YOU HAD AN ENDOSCOPIC PROCEDURE TODAY AT THE [facility]:   Refer to the procedure report that was given to you for any specific questions about what was found during the examination.  If the procedure report does not answer your questions, please call your gastroenterologist to clarify.  If you requested that your care partner not be given the details of your procedure findings, then the procedure report has been included in a sealed envelope for you to review at your convenience later.  YOU SHOULD EXPECT: Some feelings of bloating in the abdomen. Passage of more gas than usual.  Walking can help get rid of the air that was put into your GI tract during the procedure and reduce the bloating. If you had a lower endoscopy (such as a colonoscopy or flexible sigmoidoscopy) you may notice spotting of blood in your stool or on the toilet paper. If you underwent a bowel prep for your procedure, you may not have a normal bowel movement for a few days.  Please Note:  You might notice some irritation and congestion in your nose or some drainage.  This is from the oxygen used during your procedure.  There is no need for concern and it should clear up in a day or so.  SYMPTOMS TO REPORT IMMEDIATELY:  Following upper endoscopy (EGD)  Vomiting of blood or coffee ground material  New chest pain or pain under the shoulder blades  Painful or persistently difficult swallowing  New shortness of breath  Fever of 100F or higher  Black, tarry-looking stools  For urgent or emergent issues, a gastroenterologist can be reached at any hour by calling [phone]. Do not use MyChart messaging for urgent concerns.    DIET:  We do recommend a small meal at first, but then you may proceed to your regular diet.  Drink plenty of fluids but you should avoid alcoholic  beverages for 24 hours.  ACTIVITY:  You should plan to take it easy for the rest of today and you should NOT DRIVE or use heavy machinery until tomorrow (because of the sedation medicines used during the test).    FOLLOW UP: Our staff will call the number listed on your records the next business day following your procedure.  We will call around 7:15- 8:00 am to check on you and address any questions or concerns that you may have regarding the information given to you following your procedure. If we do not reach you, we will leave a message.     If any biopsies were taken you will be contacted by phone or by letter within the next 1-3 weeks.  Please call us at [phone] if you have not heard about the biopsies in 3 weeks.    SIGNATURES/CONFIDENTIALITY: You and/or your care partner have signed paperwork which will be entered into your electronic medical record.  These signatures attest to the fact that that the information above on your After Visit Summary has been reviewed and is understood.  Full responsibility of the confidentiality of this discharge information lies with you and/or your care-partner.

## 2023-10-22 ENCOUNTER — Telehealth: Payer: Self-pay | Admitting: *Deleted

## 2023-10-22 NOTE — Telephone Encounter (Signed)
  Follow up Call-     10/21/2023    3:07 PM  Call back number  Post procedure Call Back phone  # [phone]  Permission to leave phone message Yes     Patient questions:  Do you have a fever, pain , or abdominal swelling? No. Pain Score  0 *  Have you tolerated food without any problems? Yes.    Have you been able to return to your normal activities? Yes.    Do you have any questions about your discharge instructions: Diet   No. Medications  No. Follow up visit  No.  Do you have questions or concerns about your Care? No.  Actions: * If pain score is 4 or above: No action needed, pain <4.

## 2023-10-27 ENCOUNTER — Encounter: Payer: Self-pay | Admitting: Internal Medicine

## 2023-10-27 LAB — SURGICAL PATHOLOGY

## 2023-10-29 ENCOUNTER — Encounter: Payer: Medicare HMO | Admitting: Internal Medicine

## 2023-12-17 ENCOUNTER — Ambulatory Visit (INDEPENDENT_AMBULATORY_CARE_PROVIDER_SITE_OTHER): Payer: Medicare HMO | Admitting: Medical

## 2023-12-17 VITALS — BP 110/70 | Temp 97.7°F | Wt 107.2 lb

## 2023-12-17 DIAGNOSIS — J019 Acute sinusitis, unspecified: Secondary | ICD-10-CM

## 2023-12-17 DIAGNOSIS — Z1231 Encounter for screening mammogram for malignant neoplasm of breast: Secondary | ICD-10-CM

## 2023-12-17 DIAGNOSIS — R221 Localized swelling, mass and lump, neck: Secondary | ICD-10-CM | POA: Diagnosis not present

## 2023-12-17 DIAGNOSIS — R051 Acute cough: Secondary | ICD-10-CM | POA: Diagnosis not present

## 2023-12-17 MED ORDER — DOXYCYCLINE HYCLATE 100 MG PO TABS: 100.0000 mg | ORAL_TABLET | Freq: Two times a day (BID) | ORAL | 0 refills | Status: DC

## 2023-12-17 NOTE — Progress Notes (Signed)
Subjective:  [name] is a 67 y.o. female who presents for Chief Complaint  Patient presents with   nasal drainage    Nasal drainage with thick mucous with some blood in it, cough, sinus pressure, headache. Symptoms started 1.5 weeks ago.      Here for cough, sinus pressure, headaches, coughing some . Has had some soreness in roof of mouth.   No fever, no NVD.   No wheezing, no SOB.   Has some green mucous, some specks of blood in mucous at times.   Using sudafed morning, benadryl at night.  Not improving with OTC remedy.  Worried it will progress to bronchitis or lungs.  No sick contacts.    No other aggravating or relieving factors.    No other c/o.  Past Medical History:  Diagnosis Date   Allergic rhinitis    Asthma    Atherosclerosis    Chronic kidney disease (CKD), stage III (moderate) (HCC)    Esophageal stricture    GERD (gastroesophageal reflux disease)    History of anemia due to chronic kidney disease    History of colitis 2017   Hypertension    Metabolic bone disease    Raynaud disease    Scleroderma (HCC)    Current Outpatient Medications on File Prior to Visit  Medication Sig Dispense Refill   acetaminophen (TYLENOL) 325 MG tablet Take 325 mg by mouth every 6 (six) hours as needed for mild pain.     albuterol (PROVENTIL) (2.5 MG/3ML) 0.083% nebulizer solution Take 3 mLs (2.5 mg total) by nebulization every 6 (six) hours as needed for wheezing or shortness of breath. 75 mL 3   albuterol (VENTOLIN HFA) 108 (90 Base) MCG/ACT inhaler Inhale 2 puffs into the lungs every 6 (six) hours as needed for wheezing or shortness of breath. 8 g 3   amLODipine (NORVASC) 5 MG tablet Take 5 mg by mouth daily.      diphenhydrAMINE (BENADRYL) 25 mg capsule Take 25 mg by mouth every 6 (six) hours as needed for allergies.     fluticasone (FLONASE) 50 MCG/ACT nasal spray Place 1 spray into both nostrils daily.     fluticasone furoate-vilanterol (BREO ELLIPTA) 200-25 MCG/ACT AEPB  INHALE 1 PUFF EVERY DAY 180 each 1   levothyroxine (SYNTHROID) 75 MCG tablet Take 1 tablet (75 mcg total) by mouth daily. 30 tablet 1   lisinopril (ZESTRIL) 5 MG tablet Take 5 mg by mouth daily.     omeprazole (PRILOSEC) 20 MG capsule Take 20 mg by mouth 2 (two) times daily before a meal.     pseudoephedrine (SUDAFED) 120 MG 12 hr tablet Take 120 mg by mouth daily.     [DISCONTINUED] famotidine (PEPCID) 20 MG tablet Take 20 mg by mouth 2 (two) times daily.       No current facility-administered medications on file prior to visit.    The following portions of the patient's history were reviewed and updated as appropriate: allergies, current medications, past family history, past medical history, past social history, past surgical history and problem list.  ROS Otherwise as in subjective above    Objective: BP 110/70   Temp 97.7 F (36.5 C)   Wt 107 lb 3.2 oz (48.6 kg)   BMI 18.40 kg/m   General appearance: alert, no distress, well developed, well nourished HEENT: normocephalic, sclerae anicteric, conjunctiva pink and moist, TMs flat, air fluid levels present, nares with turbinates edema, mucoid discharge , mild erythema, pharynx with mild erythema Oral  cavity: MMM, no lesions Neck: supple, small pea size 3-4 mm diameter nodule palpated in lower anterior right neck, possible over thyroid, nontender, otherwise no other lymphadenopathy, no thyromegaly, no masses Heart: RRR, normal S1, S2, no murmurs Lungs: CTA bilaterally, no wheezes, rhonchi, or rales Pulses: 2+ radial pulses, 2+ pedal pulses, normal cap refill Ext: no edema   Assessment: Encounter Diagnoses  Name Primary?   Acute non-recurrent sinusitis, unspecified location Yes   Acute cough    Encounter for screening mammogram for malignant neoplasm of breast    Nodule of neck      Plan: Sinusitis-continue Sudafed and Benadryl over-the-counter along with good water intake for the next few days, and doxycycline  antibiotic.  If not much improved within the next week to let me know  Go for screening mammogram soon.  Call and schedule your mammogram.  Your past due on her mammogram  Nodule neck-we discussed the very small nodule that may be new anterior inferior neck.  Is hard to determine if this is a thyroid nodule or something else.  Advised to monitor it and if it magically resolved in the next month then no need to do anything.  However I would recommend a follow-up in a month to determine if we need to move forward with thyroid ultrasound or other imaging.    Cariah was seen today for nasal drainage.  Diagnoses and all orders for this visit:  Acute non-recurrent sinusitis, unspecified location  Acute cough  Encounter for screening mammogram for malignant neoplasm of breast -     MM 3D SCREENING MAMMOGRAM BILATERAL BREAST  Nodule of neck  Other orders -     doxycycline (VIBRA-TABS) 100 MG tablet; Take 1 tablet (100 mg total) by mouth 2 (two) times daily.    Follow up: 82mo

## 2023-12-17 NOTE — Patient Instructions (Signed)
Please call to schedule your mammogram.   [facility]  [phone] [address], [address] [place], [place] [address]  

## 2023-12-27 ENCOUNTER — Telehealth: Payer: Self-pay | Admitting: Emergency Medicine

## 2023-12-27 DIAGNOSIS — J849 Interstitial pulmonary disease, unspecified: Secondary | ICD-10-CM

## 2023-12-27 DIAGNOSIS — J9601 Acute respiratory failure with hypoxia: Secondary | ICD-10-CM

## 2023-12-27 NOTE — Telephone Encounter (Signed)
Patient does not use her oxygen at nigh and needs for it to be removed from her home.Her DME provider is [employer].

## 2023-12-28 NOTE — Telephone Encounter (Signed)
PT states it needs to be done today because her insurance expires today.

## 2023-12-31 NOTE — Telephone Encounter (Signed)
 Please advise if the removal of O2 is appropriate. Physician order required for o2 removals. Pt states her ins ends today. Forwarding to Dr Delton Coombes now.

## 2024-01-03 NOTE — Telephone Encounter (Signed)
 I called and spoke with the pt and notified of response per Dr Delton Coombes  She verbalized understanding  She still would like to d/c o2  I have placed referral for this  Nothing further needed

## 2024-01-03 NOTE — Telephone Encounter (Signed)
 I reviewed her overnight oximetry from July 2024.  She spent approximately 1 hour < 88%, which would qualify her for nocturnal oxygen .  If she is no longer wearing, does not want to continue to wear despite the test results then I am okay putting in order to discontinue

## 2024-01-04 ENCOUNTER — Ambulatory Visit (INDEPENDENT_AMBULATORY_CARE_PROVIDER_SITE_OTHER): Payer: No Typology Code available for payment source

## 2024-01-04 DIAGNOSIS — Z0000 Encounter for general adult medical examination without abnormal findings: Secondary | ICD-10-CM

## 2024-01-04 NOTE — Progress Notes (Signed)
 Subjective:   [name] is a 68 y.o. female who presents for Medicare Annual (Subsequent) preventive examination.  Visit Complete: Virtual I connected with  [name] on 01/04/24 by a audio enabled telemedicine application and verified that I am speaking with the correct person using two identifiers.  This patient declined Interactive audio and acupuncturist. Therefore the visit was completed with audio only.   Patient Location: Home  Provider Location: Office/Clinic  I discussed the limitations of evaluation and management by telemedicine. The patient expressed understanding and agreed to proceed.  Vital Signs: Because this visit was a virtual/telehealth visit, some criteria may be missing or patient reported. Any vitals not documented were not able to be obtained and vitals that have been documented are patient reported.    Cardiac Risk Factors include: advanced age (>42men, >80 women);hypertension     Objective:    Today's Vitals   There is no height or weight on file to calculate BMI.     01/04/2024    9:31 AM 05/16/2023   12:27 PM 05/16/2023    7:40 AM 12/29/2022    9:24 AM 12/05/2021    1:31 PM 12/19/2019    9:11 AM 12/01/2019    3:10 PM  Advanced Directives  Does Patient Have a Medical Advance Directive? No No No No No No No  Would patient like information on creating a medical advance directive? No - Patient declined No - Patient declined  Yes (MAU/Ambulatory/Procedural Areas - Information given)  No - Patient declined Yes (ED - Information included in AVS)    Current Medications (verified) Outpatient Encounter Medications as of 01/04/2024  Medication Sig   acetaminophen  (TYLENOL ) 325 MG tablet Take 325 mg by mouth every 6 (six) hours as needed for mild pain.   albuterol  (PROVENTIL ) (2.5 MG/3ML) 0.083% nebulizer solution Take 3 mLs (2.5 mg total) by nebulization every 6 (six) hours as needed for wheezing or shortness of breath.   albuterol   (VENTOLIN  HFA) 108 (90 Base) MCG/ACT inhaler Inhale 2 puffs into the lungs every 6 (six) hours as needed for wheezing or shortness of breath.   amLODipine  (NORVASC ) 5 MG tablet Take 5 mg by mouth daily.    diphenhydrAMINE  (BENADRYL ) 25 mg capsule Take 25 mg by mouth every 6 (six) hours as needed for allergies.   doxycycline  (VIBRA -TABS) 100 MG tablet Take 1 tablet (100 mg total) by mouth 2 (two) times daily.   fluticasone  (FLONASE ) 50 MCG/ACT nasal spray Place 1 spray into both nostrils daily.   fluticasone  furoate-vilanterol (BREO ELLIPTA ) 200-25 MCG/ACT AEPB INHALE 1 PUFF EVERY DAY   levothyroxine  (SYNTHROID ) 75 MCG tablet Take 1 tablet (75 mcg total) by mouth daily.   lisinopril  (ZESTRIL ) 5 MG tablet Take 5 mg by mouth daily.   omeprazole (PRILOSEC) 20 MG capsule Take 20 mg by mouth 2 (two) times daily before a meal.   pseudoephedrine  (SUDAFED) 120 MG 12 hr tablet Take 120 mg by mouth daily.   [DISCONTINUED] famotidine  (PEPCID ) 20 MG tablet Take 20 mg by mouth 2 (two) times daily.     No facility-administered encounter medications on file as of 01/04/2024.    Allergies (verified) Avelox [moxifloxacin hcl in nacl], Bactrim  [sulfamethoxazole -trimethoprim ], and Codeine   History: Past Medical History:  Diagnosis Date   Allergic rhinitis    Asthma    Atherosclerosis    Chronic kidney disease (CKD), stage III (moderate) (HCC)    Esophageal stricture    GERD (gastroesophageal reflux disease)  History of anemia due to chronic kidney disease    History of colitis 2017   Hypertension    Metabolic bone disease    Raynaud disease    Scleroderma (HCC)    Past Surgical History:  Procedure Laterality Date   CESAREAN SECTION     COLONOSCOPY WITH PROPOFOL  N/A 07/14/2016   Procedure: COLONOSCOPY WITH PROPOFOL ;  Surgeon: Toribio SHAUNNA Cedar, MD;  Location: [facility];  Service: Endoscopy;  Laterality: N/A;   HAND SURGERY     left; tendon repair   TONSILLECTOMY     Family History  Problem  Relation Age of Onset   Heart failure Mother    Hepatitis Mother        C   Dementia Mother    COPD Mother        emphysema   Cancer Father        lung with mets to brain   Diabetes Sister    Cancer Sister        leukemia   Hepatitis Brother    Graves' disease Daughter    Pseudotumor cerebri Daughter    Colon cancer Neg Hx    Social History   Socioeconomic History   Marital status: Widowed    Spouse name: Not on file   Number of children: 2   Years of education: Not on file   Highest education level: Not on file  Occupational History   Occupation: retired  Tobacco Use   Smoking status: Never   Smokeless tobacco: Never  Vaping Use   Vaping status: Never Used  Substance and Sexual Activity   Alcohol use: No   Drug use: No   Sexual activity: Not Currently  Other Topics Concern   Not on file  Social History Narrative   Not on file   Social Drivers of Health   Financial Resource Strain: Low Risk  (01/04/2024)   Overall Financial Resource Strain (CARDIA)    Difficulty of Paying Living Expenses: Not hard at all  Food Insecurity: No Food Insecurity (01/04/2024)   Hunger Vital Sign    Worried About Running Out of Food in the Last Year: Never true    Ran Out of Food in the Last Year: Never true  Transportation Needs: No Transportation Needs (01/04/2024)   PRAPARE - Administrator, Civil Service (Medical): No    Lack of Transportation (Non-Medical): No  Physical Activity: Inactive (01/04/2024)   Exercise Vital Sign    Days of Exercise per Week: 0 days    Minutes of Exercise per Session: 0 min  Stress: No Stress Concern Present (01/04/2024)   [employer] of Occupational Health - Occupational Stress Questionnaire    Feeling of Stress : Not at all  Social Connections: Moderately Isolated (01/04/2024)   Social Connection and Isolation Panel [NHANES]    Frequency of Communication with Friends and Family: Three times a week    Frequency of Social Gatherings with  Friends and Family: Once a week    Attends Religious Services: More than 4 times per year    Active Member of [employer] or Organizations: No    Attends Banker Meetings: Never    Marital Status: Widowed    Tobacco Counseling Counseling given: Not Answered   Clinical Intake:  Pre-visit preparation completed: Yes  Pain : No/denies pain     Nutritional Risks: None Diabetes: No  How often do you need to have someone help you when you read instructions, pamphlets, or other written  materials from your doctor or pharmacy?: 1 - Never  Interpreter Needed?: No  Information entered by :: NAllen LPN   Activities of Daily Living    01/04/2024    9:27 AM 05/16/2023    2:00 PM  In your present state of health, do you have any difficulty performing the following activities:  Hearing? 0   Vision? 0   Difficulty concentrating or making decisions? 0   Walking or climbing stairs? 0   Dressing or bathing? 0   Doing errands, shopping? 0 0  Preparing Food and eating ? N   Using the Toilet? N   In the past six months, have you accidently leaked urine? N   Do you have problems with loss of bowel control? N   Managing your Medications? N   Managing your Finances? N   Housekeeping or managing your Housekeeping? N     Patient Care Team: Tysinger, Alm RAMAN, PA-C as PCP - General (Family Medicine) Court Dorn PARAS, MD as PCP - Cardiology (Cardiology) Curt Lighter, MD as Consulting Physician (Rheumatology) Shelah Lamar RAMAN, MD as Consulting Physician (Pulmonary Disease) Tobie Gordy POUR, MD as Consulting Physician (Nephrology)  Indicate any recent Medical Services you may have received from other than [facility] providers in the past year (date may be approximate).     Assessment:   This is a routine wellness examination for [name].  Hearing/Vision screen Hearing Screening - Comments:: Denies hearing issues Vision Screening - Comments:: Regular eye exams, [facility]   Goals  Addressed             This Visit's Progress    Patient Stated       01/04/2024, stay healthy       Depression Screen    01/04/2024    9:32 AM 01/15/2023    2:53 PM 12/29/2022    9:23 AM 02/12/2022    1:38 PM 12/11/2021    3:32 PM 12/05/2021    1:32 PM 10/30/2021   10:34 AM  PHQ 2/9 Scores  PHQ - 2 Score 0 0 0 0 0 0 0    Fall Risk    01/04/2024    9:31 AM 01/15/2023    2:53 PM 12/29/2022    9:19 AM 02/12/2022    1:38 PM 12/11/2021    3:32 PM  Fall Risk   Falls in the past year? 1 0 0 0 0  Comment passed out      Number falls in past yr: 0 0 0 0 0  Injury with Fall? 0 0 0 0 0  Risk for fall due to : Medication side effect No Fall Risks No Fall Risks No Fall Risks No Fall Risks  Follow up Falls prevention discussed;Falls evaluation completed Falls evaluation completed Falls evaluation completed;Education provided;Falls prevention discussed Falls evaluation completed Falls evaluation completed    MEDICARE RISK AT HOME: Medicare Risk at Home Any stairs in or around the home?: Yes If so, are there any without handrails?: No Home free of loose throw rugs in walkways, pet beds, electrical cords, etc?: Yes Adequate lighting in your home to reduce risk of falls?: Yes Life alert?: No Use of a cane, walker or w/c?: No Grab bars in the bathroom?: Yes Shower chair or bench in shower?: No Elevated toilet seat or a handicapped toilet?: No  TIMED UP AND GO:  Was the test performed?  No    Cognitive Function:        01/04/2024    9:32 AM  12/29/2022    9:24 AM 12/05/2021    1:33 PM  6CIT Screen  What Year? 0 points 0 points 0 points  What month? 0 points 0 points 0 points  What time? 0 points 0 points 0 points  Count back from 20 0 points 0 points 0 points  Months in reverse 0 points 0 points 0 points  Repeat phrase 0 points 0 points 0 points  Total Score 0 points 0 points 0 points    Immunizations Immunization History  Administered Date(s) Administered   Fluad Quad(high Dose  65+) 12/02/2020, 10/30/2022   H1N1 12/25/2008   Influenza Split 11/12/2015, 11/07/2016   Influenza Whole 09/27/2009   Influenza,inj,Quad PF,6+ Mos 10/30/2017, 10/29/2018, 10/27/2019   Influenza-Unspecified 12/28/2021   PFIZER(Purple Top)SARS-COV-2 Vaccination 04/05/2020, 05/03/2020, 09/05/2020   PNEUMOCOCCAL CONJUGATE-20 01/15/2023   Pneumococcal Polysaccharide-23 12/04/2011   Tdap 05/16/2023    TDAP status: Up to date  Flu Vaccine status: Due, Education has been provided regarding the importance of this vaccine. Advised may receive this vaccine at local pharmacy or Health Dept. Aware to provide a copy of the vaccination record if obtained from local pharmacy or Health Dept. Verbalized acceptance and understanding.  Pneumococcal vaccine status: Up to date  Covid-19 vaccine status: Declined, Education has been provided regarding the importance of this vaccine but patient still declined. Advised may receive this vaccine at local pharmacy or Health Dept.or vaccine clinic. Aware to provide a copy of the vaccination record if obtained from local pharmacy or Health Dept. Verbalized acceptance and understanding.  Qualifies for Shingles Vaccine? Yes   Zostavax completed No   Shingrix Completed?: No.    Education has been provided regarding the importance of this vaccine. Patient has been advised to call insurance company to determine out of pocket expense if they have not yet received this vaccine. Advised may also receive vaccine at local pharmacy or Health Dept. Verbalized acceptance and understanding.  Screening Tests Health Maintenance  Topic Date Due   MAMMOGRAM  03/03/2020   INFLUENZA VACCINE  07/29/2023   COVID-19 Vaccine (4 - 2024-25 season) 01/20/2024 (Originally 08/29/2023)   Zoster Vaccines- Shingrix (1 of 2) 04/03/2024 (Originally 09/15/2007)   Medicare Annual Wellness (AWV)  01/03/2025   Colonoscopy  07/14/2026   Pneumonia Vaccine 45+ Years old  Completed   DEXA SCAN  Completed    Hepatitis C Screening  Completed   HPV VACCINES  Aged Out   DTaP/Tdap/Td  Discontinued    Health Maintenance  Health Maintenance Due  Topic Date Due   MAMMOGRAM  03/03/2020   INFLUENZA VACCINE  07/29/2023    Colorectal cancer screening: Type of screening: Colonoscopy. Completed 07/14/2016. Repeat every 10 years  Mammogram status: patient to schedule  Bone Density status: Completed 09/25/2022.   Lung Cancer Screening: (Low Dose CT Chest recommended if Age 43-80 years, 20 pack-year currently smoking OR have quit w/in 15years.) does not qualify.   Lung Cancer Screening Referral: no  Additional Screening:  Hepatitis C Screening: does qualify; Completed 11/28/2020  Vision Screening: Recommended annual ophthalmology exams for early detection of glaucoma and other disorders of the eye. Is the patient up to date with their annual eye exam?  Yes  Who is the provider or what is the name of the office in which the patient attends annual eye exams? [facility] If pt is not established with a provider, would they like to be referred to a provider to establish care? No .   Dental Screening: Recommended annual dental  exams for proper oral hygiene  Diabetic Foot Exam: n/a  Community Resource Referral / Chronic Care Management: CRR required this visit?  No   CCM required this visit?  No     Plan:     I have personally reviewed and noted the following in the patient's chart:   Medical and social history Use of alcohol, tobacco or illicit drugs  Current medications and supplements including opioid prescriptions. Patient is not currently taking opioid prescriptions. Functional ability and status Nutritional status Physical activity Advanced directives List of other physicians Hospitalizations, surgeries, and ER visits in previous 12 months Vitals Screenings to include cognitive, depression, and falls Referrals and appointments  In addition, I have reviewed and discussed  with patient certain preventive protocols, quality metrics, and best practice recommendations. A written personalized care plan for preventive services as well as general preventive health recommendations were provided to patient.     Ardella FORBES Dawn, LPN   07/29/7973   After Visit Summary: (MyChart) Due to this being a telephonic visit, the after visit summary with patients personalized plan was offered to patient via MyChart   Nurse Notes: none

## 2024-01-04 NOTE — Patient Instructions (Addendum)
 Ms. Asencio , Thank you for taking time to come for your Medicare Wellness Visit. I appreciate your ongoing commitment to your health goals. Please review the following plan we discussed and let me know if I can assist you in the future.   Referrals/Orders/Follow-Ups/Clinician Recommendations: none  This is a list of the screening recommended for you and due dates:  Health Maintenance  Topic Date Due   Flu Shot  07/29/2023   COVID-19 Vaccine (4 - 2024-25 season) 01/20/2024*   Zoster (Shingles) Vaccine (1 of 2) 04/03/2024*   Mammogram  07/03/2024*   Medicare Annual Wellness Visit  01/03/2025   Colon Cancer Screening  07/14/2026   Pneumonia Vaccine  Completed   DEXA scan (bone density measurement)  Completed   Hepatitis C Screening  Completed   HPV Vaccine  Aged Out   DTaP/Tdap/Td vaccine  Discontinued  *Topic was postponed. The date shown is not the original due date.    Advanced directives: (ACP Link)Information on Advanced Care Planning can be found at [place]  Secretary of [facility] Advance Health Care Directives Advance Health Care Directives ([email or web address]   Next Medicare Annual Wellness Visit scheduled for next year: Yes  Insert Preventive Care attachment Insert FALL PREVENTION attachment if needed

## 2024-01-12 DIAGNOSIS — N1832 Chronic kidney disease, stage 3b: Secondary | ICD-10-CM | POA: Diagnosis not present

## 2024-01-12 DIAGNOSIS — N390 Urinary tract infection, site not specified: Secondary | ICD-10-CM | POA: Diagnosis not present

## 2024-01-21 DIAGNOSIS — N189 Chronic kidney disease, unspecified: Secondary | ICD-10-CM | POA: Diagnosis not present

## 2024-01-21 DIAGNOSIS — M898X9 Other specified disorders of bone, unspecified site: Secondary | ICD-10-CM | POA: Diagnosis not present

## 2024-01-21 DIAGNOSIS — D631 Anemia in chronic kidney disease: Secondary | ICD-10-CM | POA: Diagnosis not present

## 2024-01-21 DIAGNOSIS — N1832 Chronic kidney disease, stage 3b: Secondary | ICD-10-CM | POA: Diagnosis not present

## 2024-01-21 DIAGNOSIS — I129 Hypertensive chronic kidney disease with stage 1 through stage 4 chronic kidney disease, or unspecified chronic kidney disease: Secondary | ICD-10-CM | POA: Diagnosis not present

## 2024-01-28 DIAGNOSIS — M1990 Unspecified osteoarthritis, unspecified site: Secondary | ICD-10-CM | POA: Diagnosis not present

## 2024-01-28 DIAGNOSIS — M349 Systemic sclerosis, unspecified: Secondary | ICD-10-CM | POA: Diagnosis not present

## 2024-01-28 DIAGNOSIS — N189 Chronic kidney disease, unspecified: Secondary | ICD-10-CM | POA: Diagnosis not present

## 2024-01-28 DIAGNOSIS — J984 Other disorders of lung: Secondary | ICD-10-CM | POA: Diagnosis not present

## 2024-01-28 DIAGNOSIS — I7300 Raynaud's syndrome without gangrene: Secondary | ICD-10-CM | POA: Diagnosis not present

## 2024-02-01 ENCOUNTER — Telehealth: Payer: Self-pay | Admitting: Emergency Medicine

## 2024-02-01 MED ORDER — AMOXICILLIN-POT CLAVULANATE 875-125 MG PO TABS: 1.0000 | ORAL_TABLET | Freq: Two times a day (BID) | ORAL | 0 refills | Status: DC

## 2024-02-01 NOTE — Telephone Encounter (Signed)
 Pt states the cough, congestion, and ear ache has went on for 1 week. The cough is wet in the morning and the mucous is green, the rest of the day it is dry. Used her rescue inhaler twice over the weekend and once yesterday. Ear ache on the right side x 1 week. Pt is scheduled for a CT on Friday and is unsure if she can still have it? Pt has had Tylenol , Sudafed, and Benadryl . Pt denies SOB and thinks she may have had a fever over the weekend. Please advise.

## 2024-02-01 NOTE — Telephone Encounter (Signed)
Patient is has a stopped up head with a dry cough. She currently has a CT scheduled for this Friday but is unsure if she should get it done. Please call and advise. [phone]

## 2024-02-01 NOTE — Telephone Encounter (Signed)
Patient is aware of results/recommendations and voiced her understanding.    PCC's please post pone CT for one month. Once scheduled, please notify front so f/u with Dr. Isaiah Serge can be scheduled.

## 2024-02-01 NOTE — Telephone Encounter (Signed)
 Chart reviewed.  Large hiatal hernia.  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  Worsening cough.  See no discussion of reactive airways disease asthma etc.  Augmentin  1 tablet twice a day for 7 days prescribed.  Recommend rescheduling CT scan, delay for 1 month.  I see no follow-up in clinic for results of this, please arrange follow-up with Dr. Theophilus after CT scan.

## 2024-02-02 NOTE — Telephone Encounter (Signed)
 Pt has been Scheduled to 03/03/24 and is aware of appt. Sending to Front to schedule the follow up since it doesn't seem like I'm able to do it on my end.

## 2024-02-04 ENCOUNTER — Ambulatory Visit: Payer: Medicare HMO | Admitting: Internal Medicine

## 2024-02-04 ENCOUNTER — Other Ambulatory Visit (HOSPITAL_COMMUNITY): Payer: No Typology Code available for payment source

## 2024-02-17 ENCOUNTER — Ambulatory Visit: Payer: Medicare HMO | Admitting: Internal Medicine

## 2024-03-03 ENCOUNTER — Ambulatory Visit (HOSPITAL_COMMUNITY)
Admission: RE | Admit: 2024-03-03 | Discharge: 2024-03-03 | Disposition: A | Discharge status: Home / Self Care | Payer: No Typology Code available for payment source | Source: Ambulatory Visit | Attending: Pulmonary Disease | Admitting: Pulmonary Disease

## 2024-03-03 DIAGNOSIS — J849 Interstitial pulmonary disease, unspecified: Secondary | ICD-10-CM | POA: Diagnosis not present

## 2024-03-29 ENCOUNTER — Telehealth: Payer: Self-pay | Admitting: Medical

## 2024-03-29 MED ORDER — FLUTICASONE FUROATE-VILANTEROL 200-25 MCG/ACT IN AEPB: 1.0000 | INHALATION_SPRAY | Freq: Every day | RESPIRATORY_TRACT | 0 refills | Status: DC

## 2024-03-29 NOTE — Telephone Encounter (Signed)
 Med check scheduled 04/07/24

## 2024-03-29 NOTE — Telephone Encounter (Signed)
 Refilled this.   Patient is past due on her visit with [facility]. Please schedule

## 2024-03-29 NOTE — Telephone Encounter (Signed)
 Fax from [facility] [phone]

## 2024-03-31 ENCOUNTER — Telehealth: Payer: Self-pay

## 2024-03-31 ENCOUNTER — Other Ambulatory Visit (HOSPITAL_COMMUNITY): Payer: Self-pay

## 2024-03-31 NOTE — Telephone Encounter (Signed)
 Pharmacy Patient Advocate Encounter  Insurance verification completed.   The patient is insured through [facility] MEDICARE  Ran test claim for Breo Ellipta 200-25MCG/ACT aerosol powder. Currently a quantity of 180G is a 90 day supply and the co-pay is RTS. The Rx was currently filled on 4.2.25 and NO P/A is currently needed .      This test claim was processed through [facility]- copay amounts may vary at other pharmacies due to pharmacy/plan contracts, or as the patient moves through the different stages of their insurance plan.

## 2024-04-07 ENCOUNTER — Ambulatory Visit: Admitting: Medical

## 2024-04-07 VITALS — BP 110/60 | HR 80 | Wt 110.0 lb

## 2024-04-07 DIAGNOSIS — R7989 Other specified abnormal findings of blood chemistry: Secondary | ICD-10-CM | POA: Diagnosis not present

## 2024-04-07 DIAGNOSIS — E2839 Other primary ovarian failure: Secondary | ICD-10-CM | POA: Diagnosis not present

## 2024-04-07 DIAGNOSIS — J849 Interstitial pulmonary disease, unspecified: Secondary | ICD-10-CM | POA: Diagnosis not present

## 2024-04-07 DIAGNOSIS — R6889 Other general symptoms and signs: Secondary | ICD-10-CM | POA: Diagnosis not present

## 2024-04-07 DIAGNOSIS — I10 Essential (primary) hypertension: Secondary | ICD-10-CM

## 2024-04-07 DIAGNOSIS — I7090 Unspecified atherosclerosis: Secondary | ICD-10-CM

## 2024-04-07 DIAGNOSIS — Z0000 Encounter for general adult medical examination without abnormal findings: Secondary | ICD-10-CM | POA: Diagnosis not present

## 2024-04-07 DIAGNOSIS — Z780 Asymptomatic menopausal state: Secondary | ICD-10-CM

## 2024-04-07 NOTE — Progress Notes (Signed)
 Subjective:   HPI  [name] is a 68 y.o. female who presents for Chief Complaint  Patient presents with   Medication Screening    F/U medication review    Patient Care Team: Latanja Lehenbauer, Kermit Balo, PA-C as PCP - General (Family Medicine) Runell Gess, MD as PCP - Cardiology (Cardiology) Casimer Lanius, MD as Consulting Physician (Rheumatology) Leslye Peer, MD as Consulting Physician (Pulmonary Disease) Dagoberto Ligas, MD as Consulting Physician (Nephrology) Sees dentist Sees eye doctor Dr. Canary Brim, GI [facility], Dr. Yetta Barre, Dorcas Mcmurray   Concerns: Here for well visit.  Has osteoporosis, has been offered Prolia or other treatment by [place] and rheumatology but she still has not decided to move forward with this  Raynaud's phenomenon, scleroderma-uses amlodipine, routine follow-up with rheumatology  Lung disease, asthma-compliant with Breo, albuterol  Chronic kidney disease -compliant with ACE  Reviewed their medical, surgical, family, social, medication, and allergy history and updated chart as appropriate.   Past Medical History:  Diagnosis Date   Allergic rhinitis    Asthma    Atherosclerosis    Chronic kidney disease (CKD), stage III (moderate) (HCC)    Esophageal stricture    GERD (gastroesophageal reflux disease)    History of anemia due to chronic kidney disease    History of colitis 2017   Hypertension    Metabolic bone disease    Raynaud disease    Scleroderma (HCC)     Family History  Problem Relation Age of Onset   Heart failure Mother    Hepatitis Mother        C   Dementia Mother    COPD Mother        emphysema   Cancer Father        lung with mets to brain   Diabetes Sister    Cancer Sister        leukemia   Hepatitis Brother    Graves' disease Daughter    Pseudotumor cerebri Daughter    Colon cancer Neg Hx      Current Outpatient Medications:    acetaminophen (TYLENOL) 325 MG tablet, Take 325 mg by  mouth every 6 (six) hours as needed for mild pain., Disp: , Rfl:    albuterol (PROVENTIL) (2.5 MG/3ML) 0.083% nebulizer solution, Take 3 mLs (2.5 mg total) by nebulization every 6 (six) hours as needed for wheezing or shortness of breath., Disp: 75 mL, Rfl: 3   albuterol (VENTOLIN HFA) 108 (90 Base) MCG/ACT inhaler, Inhale 2 puffs into the lungs every 6 (six) hours as needed for wheezing or shortness of breath., Disp: 8 g, Rfl: 3   amLODipine (NORVASC) 5 MG tablet, Take 5 mg by mouth daily. , Disp: , Rfl:    diphenhydrAMINE (BENADRYL) 25 mg capsule, Take 25 mg by mouth every 6 (six) hours as needed for allergies., Disp: , Rfl:    fluticasone (FLONASE) 50 MCG/ACT nasal spray, Place 1 spray into both nostrils daily., Disp: , Rfl:    fluticasone furoate-vilanterol (BREO ELLIPTA) 200-25 MCG/ACT AEPB, Inhale 1 puff into the lungs daily., Disp: 180 each, Rfl: 0   levothyroxine (SYNTHROID) 75 MCG tablet, Take 1 tablet (75 mcg total) by mouth daily., Disp: 30 tablet, Rfl: 1   lisinopril (ZESTRIL) 5 MG tablet, Take 5 mg by mouth daily., Disp: , Rfl:    omeprazole (PRILOSEC) 20 MG capsule, Take 20 mg by mouth 2 (two) times daily before a meal., Disp: , Rfl:    pseudoephedrine (SUDAFED)  120 MG 12 hr tablet, Take 120 mg by mouth daily., Disp: , Rfl:   Allergies  Allergen Reactions   Avelox [Moxifloxacin Hcl In Nacl]     Caused tachycardia   Bactrim [Sulfamethoxazole-Trimethoprim]    Codeine Hives and Other (See Comments)    Hallucination,    Review of Systems  Constitutional:  Negative for chills, fever, malaise/fatigue and weight loss.  HENT:  Negative for congestion, ear pain, hearing loss, sore throat and tinnitus.   Eyes:  Negative for blurred vision, pain and redness.  Respiratory:  Negative for cough, hemoptysis and shortness of breath.   Cardiovascular:  Negative for chest pain, palpitations, orthopnea, claudication and leg swelling.  Gastrointestinal:  Negative for abdominal pain, blood in  stool, constipation, diarrhea, nausea and vomiting.  Genitourinary:  Negative for dysuria, flank pain, frequency, hematuria and urgency.  Musculoskeletal:  Negative for falls, joint pain and myalgias.  Skin:  Negative for itching and rash.  Neurological:  Negative for dizziness, tingling, speech change, weakness and headaches.  Endo/Heme/Allergies:  Negative for polydipsia. Does not bruise/bleed easily.  Psychiatric/Behavioral:  Negative for depression and memory loss. The patient is not nervous/anxious and does not have insomnia.        04/07/2024   10:03 AM 01/04/2024    9:32 AM 01/15/2023    2:53 PM 12/29/2022    9:23 AM 02/12/2022    1:38 PM  Depression screen PHQ 2/9  Decreased Interest 0 0 0 0 0  Down, Depressed, Hopeless 0 0 0 0 0  PHQ - 2 Score 0 0 0 0 0       Objective:  BP 110/60   Pulse 80   Wt 110 lb (49.9 kg)   BMI 18.88 kg/m   Wt Readings from Last 3 Encounters:  04/07/24 110 lb (49.9 kg)  12/17/23 107 lb 3.2 oz (48.6 kg)  10/21/23 107 lb (48.5 kg)    General appearance: alert, no distress, WD/WN, Caucasian female Skin: tight appearing skins of toes and fingers, no other worrisome lesions HEENT: normocephalic, conjunctiva/corneas normal, sclerae anicteric, PERRLA, EOMi, nares patent, no discharge or erythema, pharynx normal Oral cavity: MMM, tongue normal, teeth in good repair Neck: supple, no lymphadenopathy, no thyromegaly, no masses, normal ROM, no bruits Chest: non tender, normal shape and expansion Heart: RRR, normal S1, S2, no murmurs Lungs: CTA bilaterally, no wheezes, rhonchi, or rales Abdomen: +bs, soft, non tender, non distended, no masses, no hepatomegaly, no splenomegaly, no bruits Back: non tender, normal ROM, no scoliosis Musculoskeletal: upper extremities non tender, no obvious deformity, normal ROM throughout, lower extremities non tender, no obvious deformity, normal ROM throughout Extremities: no edema, no clubbing Pulses: 2+ symmetric, upper  and lower extremities, normal cap refill Neurological: alert, oriented x 3, CN2-12 intact, strength normal upper extremities and lower extremities, sensation normal throughout, DTRs 2+ throughout, no cerebellar signs, gait normal Psychiatric: normal affect, behavior normal, pleasant  Breast/gyn/rectal - deferred to gynecology     Assessment and Plan :   Encounter Diagnoses  Name Primary?   Encounter for health maintenance examination in adult Yes   Essential hypertension    Atherosclerosis    ILD (interstitial lung disease) (HCC)    Post-menopausal    Estrogen deficiency    Abnormal thyroid blood test      This visit was a preventative care visit, also known as wellness visit or routine physical.   Topics typically include healthy lifestyle, diet, exercise, preventative care, vaccinations, sick and well care, proper use of  emergency dept and after hours care, as well as other concerns.     Recommendations: Continue to return yearly for your annual wellness and preventative care visits.  This gives [place] a chance to discuss healthy lifestyle, exercise, vaccinations, review your chart record, and perform screenings where appropriate.  I recommend you see your eye doctor yearly for routine vision care.  I recommend you see your dentist yearly for routine dental care including hygiene visits twice yearly.  See gynecology for routine follow up   Vaccination recommendations were reviewed Immunization History  Administered Date(s) Administered   Fluad Quad(high Dose 65+) 12/02/2020, 10/30/2022   H1N1 12/25/2008   Influenza Split 11/12/2015, 11/07/2016   Influenza Whole 09/27/2009   Influenza,inj,Quad PF,6+ Mos 10/30/2017, 10/29/2018, 10/27/2019   Influenza-Unspecified 12/28/2021   PFIZER(Purple Top)SARS-COV-2 Vaccination 04/05/2020, 05/03/2020, 09/05/2020   PNEUMOCOCCAL CONJUGATE-20 01/15/2023   Pneumococcal Polysaccharide-23 12/04/2011   Tdap 05/16/2023   Consider shingrix  vaccine She declines for now   Screening for cancer: Colon cancer screening: Due for colonoscopy.  She declines.  Offered Cologuard.  She will consider but declines this today as well.  She also declines repeat colonoscopy.   Breast cancer screening: You should perform a self breast exam monthly.   We reviewed recommendations for regular mammograms and breast cancer screening. Due for mammogram.  She plans to get her mammogram in the next few months   Cervical cancer screening: We reviewed recommendations for pap smear screening.  She is way past due for Pap smear.  She declines this today but has a gynecologist and she will consider going in for Pap smear.    Skin cancer screening: Check your skin regularly for new changes, growing lesions, or other lesions of concern Come in for evaluation if you have skin lesions of concern.  Lung cancer screening: If you have a greater than 20 pack year history of tobacco use, then you may qualify for lung cancer screening with a chest CT scan.   Please call your insurance company to inquire about coverage for this test.  We currently don't have screenings for other cancers besides breast, cervical, colon, and lung cancers.  If you have a strong family history of cancer or have other cancer screening concerns, please let me know.    Bone health: Get at least 150 minutes of aerobic exercise weekly Get weight bearing exercise at least once weekly Bone density test:  A bone density test is an imaging test that uses a type of X-ray to measure the amount of calcium and other minerals in your bones. The test may be used to diagnose or screen you for a condition that causes weak or thin bones (osteoporosis), predict your risk for a broken bone (fracture), or determine how well your osteoporosis treatment is working. The bone density test is recommended for females 65 and older, or females or males <65 if certain risk factors such as thyroid disease,  long term use of steroids such as for asthma or rheumatological issues, vitamin D deficiency, estrogen deficiency, family history of osteoporosis, self or family history of fragility fracture in first degree relative.  I reviewed her bone density test from 2023 showing osteoporosis.  Discussed again risk of osteoporosis.  Discussed medication options.  I recommended Prolia again but she declines.  She will think about it some more.  Counseled on weightbearing and aerobic exercise.   Heart health: Get at least 150 minutes of aerobic exercise weekly Limit alcohol It is important to maintain a healthy  blood pressure and healthy cholesterol numbers  Heart disease screening: Screening for heart disease includes screening for blood pressure, fasting lipids, glucose/diabetes screening, BMI height to weight ratio, reviewed of smoking status, physical activity, and diet.    Goals include blood pressure 120/80 or less, maintaining a healthy lipid/cholesterol profile, preventing diabetes or keeping diabetes numbers under good control, not smoking or using tobacco products, exercising most days per week or at least 150 minutes per week of exercise, and eating healthy variety of fruits and vegetables, healthy oils, and avoiding unhealthy food choices like fried food, fast food, high sugar and high cholesterol foods.     CT chest on 06/2022 showing atherosclerosis of aorta and CAD.    Medical care options: I recommend you continue to seek care here first for routine care.  We try really hard to have available appointments Monday through Friday daytime hours for sick visits, acute visits, and physicals.  Urgent care should be used for after hours and weekends for significant issues that cannot wait till the next day.  The emergency department should be used for significant potentially life-threatening emergencies.  The emergency department is expensive, can often have long wait times for less significant  concerns, so try to utilize primary care, urgent care, or telemedicine when possible to avoid unnecessary trips to the emergency department.  Virtual visits and telemedicine have been introduced since the pandemic started in 2020, and can be convenient ways to receive medical care.  We offer virtual appointments as well to assist you in a variety of options to seek medical care.   Advanced Directives: I recommend you consider completing a Health Care Power of Attorney and Living Will.   These documents respect your wishes and help alleviate burdens on your loved ones if you were to become terminally ill or be in a position to need those documents enforced.    You can complete Advanced Directives yourself, have them notarized, then have copies made for our office, for you and for anybody you feel should have them in safe keeping.  Or, you can have an attorney prepare these documents.   If you haven't updated your Last Will and Testament in a while, it may be worthwhile having an attorney prepare these documents together and save on some costs.       Separate significant issues discussed: We discussed prior finding of CAD and atherosclerosis on scan.  She notes that her rheumatologist and knee father just have recommended against statin given her other underlying history.  I recommended considering other options such as fenofibrate or Repatha.  She declines for now.  Raynaud's, scleroderma-managed by rheumatology  Chronic kidney disease-  Continue ACE, lisinopril, avoid NSAIDs, hydrate well daily  Hypertension-continue amlodipine 5 mg and lisinopril 5 mg for blood pressure, kidney protection and Raynaud's  Osteoporosis-we discussed the need for aerobic exercise, weightbearing exercise, medication to slow the rate of bone loss.  She declines medications for osteoporosis.  Both rheumatology and so is recommended medication such as Prolia or Fosamax but she declines.  Hypothyroidism-continue  levothyroxine 75 mcg daily.  Updated labs today  Interstitial lung disease, asthma-continue Breo for maintenance, albuterol as needed   Maurya was seen today for medication screening.  Diagnoses and all orders for this visit:  Encounter for health maintenance examination in adult -     TSH + free T4 -     Lipid panel -     CBC with Differential/Platelet -     Hepatic function  panel  Essential hypertension  Atherosclerosis -     Lipid panel  ILD (interstitial lung disease) (HCC)  Post-menopausal  Estrogen deficiency  Abnormal thyroid blood test -     TSH + free T4    Follow-up pending labs, yearly for physical

## 2024-04-08 LAB — CBC WITH DIFFERENTIAL/PLATELET
Basophils Absolute: 0.1 10*3/uL (ref 0.0–0.2)
Basos: 1 %
EOS (ABSOLUTE): 0.3 10*3/uL (ref 0.0–0.4)
Eos: 4 %
Hematocrit: 37.7 % (ref 34.0–46.6)
Hemoglobin: 12.2 g/dL (ref 11.1–15.9)
Immature Grans (Abs): 0 10*3/uL (ref 0.0–0.1)
Immature Granulocytes: 0 %
Lymphocytes Absolute: 1.5 10*3/uL (ref 0.7–3.1)
Lymphs: 17 %
MCH: 30.2 pg (ref 26.6–33.0)
MCHC: 32.4 g/dL (ref 31.5–35.7)
MCV: 93 fL (ref 79–97)
Monocytes Absolute: 0.7 10*3/uL (ref 0.1–0.9)
Monocytes: 8 %
Neutrophils Absolute: 6.1 10*3/uL (ref 1.4–7.0)
Neutrophils: 70 %
Platelets: 275 10*3/uL (ref 150–450)
RBC: 4.04 x10E6/uL (ref 3.77–5.28)
RDW: 12 % (ref 11.7–15.4)
WBC: 8.7 10*3/uL (ref 3.4–10.8)

## 2024-04-08 LAB — LIPID PANEL
Chol/HDL Ratio: 3 ratio (ref 0.0–4.4)
Cholesterol, Total: 157 mg/dL (ref 100–199)
HDL: 52 mg/dL (ref >39)
LDL Chol Calc (NIH): 88 mg/dL (ref 0–99)
Triglycerides: 92 mg/dL (ref 0–149)
VLDL Cholesterol Cal: 17 mg/dL (ref 5–40)

## 2024-04-08 LAB — HEPATIC FUNCTION PANEL
ALT: 7 IU/L (ref 0–32)
AST: 23 IU/L (ref 0–40)
Albumin: 4.4 g/dL (ref 3.9–4.9)
Alkaline Phosphatase: 70 IU/L (ref 44–121)
Bilirubin Total: 0.2 mg/dL (ref 0.0–1.2)
Bilirubin, Direct: 0.09 mg/dL (ref 0.00–0.40)
Total Protein: 7.3 g/dL (ref 6.0–8.5)

## 2024-04-08 LAB — TSH+FREE T4
Free T4: 1.26 ng/dL (ref 0.82–1.77)
TSH: 3.89 u[IU]/mL (ref 0.450–4.500)

## 2024-04-16 ENCOUNTER — Other Ambulatory Visit: Payer: Self-pay | Admitting: Medical

## 2024-04-16 MED ORDER — LEVOTHYROXINE SODIUM 75 MCG PO TABS: 75.0000 ug | ORAL_TABLET | Freq: Every day | ORAL | 3 refills | Status: AC

## 2024-04-16 NOTE — Progress Notes (Signed)
 Results sent through MyChart

## 2024-04-20 ENCOUNTER — Ambulatory Visit: Payer: No Typology Code available for payment source | Admitting: Pulmonary Disease

## 2024-04-20 ENCOUNTER — Encounter: Payer: Self-pay | Admitting: Pulmonary Disease

## 2024-04-20 VITALS — BP 110/70 | HR 72 | Temp 98.1°F | Ht 64.0 in | Wt 110.0 lb

## 2024-04-20 DIAGNOSIS — K224 Dyskinesia of esophagus: Secondary | ICD-10-CM | POA: Diagnosis not present

## 2024-04-20 DIAGNOSIS — J849 Interstitial pulmonary disease, unspecified: Secondary | ICD-10-CM | POA: Diagnosis not present

## 2024-04-20 DIAGNOSIS — N189 Chronic kidney disease, unspecified: Secondary | ICD-10-CM

## 2024-04-20 DIAGNOSIS — R0600 Dyspnea, unspecified: Secondary | ICD-10-CM

## 2024-04-20 DIAGNOSIS — Z5181 Encounter for therapeutic drug level monitoring: Secondary | ICD-10-CM | POA: Diagnosis not present

## 2024-04-20 DIAGNOSIS — M3481 Systemic sclerosis with lung involvement: Secondary | ICD-10-CM | POA: Diagnosis not present

## 2024-04-20 DIAGNOSIS — I270 Primary pulmonary hypertension: Secondary | ICD-10-CM

## 2024-04-20 DIAGNOSIS — I7300 Raynaud's syndrome without gangrene: Secondary | ICD-10-CM

## 2024-04-20 LAB — CBC WITH DIFFERENTIAL/PLATELET
Basophils Absolute: 0.1 10*3/uL (ref 0.0–0.1)
Basophils Relative: 1.3 % (ref 0.0–3.0)
Eosinophils Absolute: 0.3 10*3/uL (ref 0.0–0.7)
Eosinophils Relative: 3.6 % (ref 0.0–5.0)
HCT: 36.9 % (ref 36.0–46.0)
Hemoglobin: 12.1 g/dL (ref 12.0–15.0)
Lymphocytes Relative: 15.4 % (ref 12.0–46.0)
Lymphs Abs: 1.5 10*3/uL (ref 0.7–4.0)
MCHC: 32.7 g/dL (ref 30.0–36.0)
MCV: 93.3 fl (ref 78.0–100.0)
Monocytes Absolute: 0.7 10*3/uL (ref 0.1–1.0)
Monocytes Relative: 7.7 % (ref 3.0–12.0)
Neutro Abs: 6.9 10*3/uL (ref 1.4–7.7)
Neutrophils Relative %: 72 % (ref 43.0–77.0)
Platelets: 300 10*3/uL (ref 150.0–400.0)
RBC: 3.95 Mil/uL (ref 3.87–5.11)
RDW: 13.3 % (ref 11.5–15.5)
WBC: 9.6 10*3/uL (ref 4.0–10.5)

## 2024-04-20 LAB — COMPREHENSIVE METABOLIC PANEL WITH GFR
ALT: 10 U/L (ref 0–35)
AST: 22 U/L (ref 0–37)
Albumin: 4.3 g/dL (ref 3.5–5.2)
Alkaline Phosphatase: 57 U/L (ref 39–117)
BUN: 22 mg/dL (ref 6–23)
CO2: 27 meq/L (ref 19–32)
Calcium: 9.1 mg/dL (ref 8.4–10.5)
Chloride: 97 meq/L (ref 96–112)
Creatinine, Ser: 1.5 mg/dL — ABNORMAL HIGH (ref 0.40–1.20)
GFR: 36.07 mL/min — ABNORMAL LOW (ref >60.00)
Glucose, Bld: 84 mg/dL (ref 70–99)
Potassium: 4.2 meq/L (ref 3.5–5.1)
Sodium: 132 meq/L — ABNORMAL LOW (ref 135–145)
Total Bilirubin: 0.3 mg/dL (ref 0.2–1.2)
Total Protein: 7.7 g/dL (ref 6.0–8.3)

## 2024-04-20 LAB — BRAIN NATRIURETIC PEPTIDE: Pro B Natriuretic peptide (BNP): 93 pg/mL (ref 0.0–100.0)

## 2024-04-20 MED ORDER — MYCOPHENOLATE MOFETIL 500 MG PO TABS: 500.0000 mg | ORAL_TABLET | Freq: Two times a day (BID) | ORAL | 3 refills | Status: AC

## 2024-04-20 NOTE — Progress Notes (Signed)
 [name]    [id number]    1956/08/19  Primary Care Physician:Tysinger, Christiane Cowing, PA-C  Referring Physician: Claudene Crystal, PA-C [address] [place],  [place] [address]  Chief complaint: Follow-up for for interstitial lung disease  HPI: 68 y.o. who  has a past medical history of Allergic rhinitis, Asthma, Atherosclerosis, Chronic kidney disease (CKD), stage III (moderate) (HCC), Esophageal stricture, GERD (gastroesophageal reflux disease), History of anemia due to chronic kidney disease, History of colitis (2017), Hypertension, Metabolic bone disease, Raynaud disease, and Scleroderma (HCC).   History of scleroderma, Raynaud's disease diagnosed with skin biopsy in 2004.  She was on methotrexate from 2000 03/29/2004 and currently not on treatment.  Follows with Dr. Bascom Lily from rheumatology  History also significant for asthma, esophageal dysmotility and stricture.  She was previously followed by Dr. Elvin Hammer and had esophageal dilatation in the past.  Currently on Prilosec and she is managing with elevation of the head of the bed.  Followed with Dr. Baldwin Levee.  She has been referred for evaluation of abnormal CT  Pets: Dogs, [place] pigs Occupation: Used to work in [employer] in her history.  Currently on disability Exposures: No mold, hot tub, Jacuzzi.  No feather pillows or comforters Smoking history: Never smoker Travel history: No significant travel history Relevant family history: No family history of lung disease  Interim history: Discussed the use of AI scribe software for clinical note transcription with the patient, who gave verbal consent to proceed.  History of Present Illness [name] is a 68 year old female with scleroderma and interstitial lung disease who presents for follow-up of interstitial lung disease.  She has interstitial lung disease associated with scleroderma, which was previously stable but now shows slight progression on a recent CT  scan. No new breathing problems or changes in her health are reported. She has a history of recurrent pneumonia, but the current lung findings are attributed to scleroderma.  She is currently on nifedipine for scleroderma and Raynaud's disease, and lisinopril  prescribed by her nephrologist. She also takes Prilosec twice a day for esophageal dysmotility related to scleroderma. She had an EGD in October 2020 for esophageal stricture, which was dilated.  A pulmonary function test from last year was difficult to complete due to bronchitis. During the test, a technician noted an irregular heart rate, but her cardiologist did not confirm this finding. She was hospitalized in May 2024 for coughing and syncope, during which an echocardiogram showed mild pulmonary hypertension.  She has a history of kidney failure and is under the care of Dr. Lydia Sams from [facility]. She also has a history of colitis from 2017, which requires her to be cautious with her diet.    Outpatient Encounter Medications as of 04/20/2024  Medication Sig   acetaminophen  (TYLENOL ) 325 MG tablet Take 325 mg by mouth every 6 (six) hours as needed for mild pain.   albuterol  (PROVENTIL ) (2.5 MG/3ML) 0.083% nebulizer solution Take 3 mLs (2.5 mg total) by nebulization every 6 (six) hours as needed for wheezing or shortness of breath.   albuterol  (VENTOLIN  HFA) 108 (90 Base) MCG/ACT inhaler Inhale 2 puffs into the lungs every 6 (six) hours as needed for wheezing or shortness of breath.   amLODipine  (NORVASC ) 5 MG tablet Take 5 mg by mouth daily.    diphenhydrAMINE  (BENADRYL ) 25 mg capsule Take 25 mg by mouth every 6 (six) hours as needed for allergies.   fluticasone  (FLONASE ) 50  MCG/ACT nasal spray Place 1 spray into both nostrils daily.   fluticasone  furoate-vilanterol (BREO ELLIPTA ) 200-25 MCG/ACT AEPB Inhale 1 puff into the lungs daily.   levothyroxine  (SYNTHROID ) 75 MCG tablet Take 1 tablet (75 mcg total) by mouth daily.   lisinopril   (ZESTRIL ) 5 MG tablet Take 5 mg by mouth daily.   omeprazole (PRILOSEC) 20 MG capsule Take 20 mg by mouth 2 (two) times daily before a meal.   pseudoephedrine  (SUDAFED) 120 MG 12 hr tablet Take 120 mg by mouth daily.   [DISCONTINUED] famotidine  (PEPCID ) 20 MG tablet Take 20 mg by mouth 2 (two) times daily.     No facility-administered encounter medications on file as of 04/20/2024.   Physical Exam: Blood pressure 106/68, pulse 82, height 5\' 4"  (1.626 m), weight 108 lb 3.2 oz (49.1 kg), SpO2 100 %. Gen:      No acute distress HEENT:  EOMI, sclera anicteric Neck:     No masses; no thyromegaly Lungs:    Clear to auscultation bilaterally; normal respiratory effort CV:         Regular rate and rhythm; no murmurs Abd:      + bowel sounds; soft, non-tender; no palpable masses, no distension Ext:    No edema; adequate peripheral perfusion Skin:      Warm and dry; no rash Neuro: alert and oriented x 3 Psych: normal mood and affect   Data Reviewed: Imaging: CT chest 12/05/2019-increasing groundglass opacities in the upper lobes.  CT chest 05/30/2020-resolution of groundglass, septal thickening with nodules in the bilateral upper lobes.  CTA 12/23/2020-no pulmonary embolism, resolution of multifocal lung nodules and groundglass in the upper lobe.  Subpleural reticulation scattered bronchiectasis.  CT high-resolution 07/10/2022-patchy areas of groundglass attenuation and septal thickening with few areas of mild cylindrical bronchiectasis and peripheral bronchiolectasis with no gradient.  Alternate diagnosis.  Patulous fluid-filled esophagus, coronary and aortic atherosclerosis.  CT high-resolution 02/11/2023-lower lung predominant fibrotic interstitial lung disease which is stable.  Resolution of left upper lobe groundglass opacities.  Dilated esophagus with layering debris's.  CT high-resolution 03/29/2024-fibrotic interstitial lung disease likely progressive from 2024.  Probable UIP  I have reviewed  the images personally.  PFTs: 05/15/2022 FEV1 1.72 [70%] FVC 1.88 [58%], F/F 91, TLC 3.90 [77%], DLCO 10.55 [50%] Restrictive lung disease, diffusion impairment.  05/07/2023 FVC 1.71 [54%], FEV1 1.66 [68%], F/F97, TLC 3.81 [95%], DLCO 13.91 [70%] Moderate diffusion defect, mild diffusion impairment   Labs:   Assessment & Plan Scleroderma with interstitial lung disease Mild interstitial lung disease associated with scleroderma, with subtle progression on recent CT scan. Consideration for Cellcept  due to potential progression. Discussed potential side effects, including diarrhea and GI symptoms. She expressed concern about GI side effects due to past colitis but agreed to start at a low dose with monitoring. - Start Cellcept  twice daily - Order blood tests to monitor before starting Cellcept  - Order lung function test in three months - Monitor for GI side effects and manage with Imodium  if necessary  Mild pulmonary hypertension Mild pulmonary hypertension on previous echocardiogram during hospitalization in May 2024. Reassessment needed due to potential progression associated with scleroderma. - Check with cardiology if they can arrange for echocardiogram.  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 Managed with Prilosec twice daily.  Raynaud's disease Raynaud's disease managed with nifedipine.  Chronic kidney disease Chronic kidney disease managed by Dr. Lydia Sams, nephrologist.   Plan/Recommendations: Start CellCept , check labs for monitoring  PFTs Arrange for echocardiogram  Phyllis Breeze MD [facility] 04/20/2024, 11:01 AM  CC: Claudene Crystal, PA-C

## 2024-04-20 NOTE — Patient Instructions (Signed)
 VISIT SUMMARY:  Today, you had a follow-up appointment to discuss your interstitial lung disease associated with scleroderma. We reviewed your recent CT scan, which showed slight progression of your lung disease. We also discussed your mild pulmonary hypertension, esophageal dysmotility, Raynaud's disease, and chronic kidney disease.  YOUR PLAN:  -SCLERODERMA WITH INTERSTITIAL LUNG DISEASE: Scleroderma is a chronic connective tissue disease, and interstitial lung disease is a condition where the lung tissue becomes scarred and stiff. Your recent CT scan showed slight progression. We will start you on Cellcept  twice daily to help manage this progression. We discussed potential side effects, including diarrhea and GI symptoms, and you agreed to start at a low dose with monitoring. We will also order blood tests before you start Cellcept  and a lung function test in three months. If you experience any GI side effects, we can manage them with Imodium .  -MILD PULMONARY HYPERTENSION: Pulmonary hypertension is high blood pressure in the lungs' arteries. Your previous echocardiogram showed mild pulmonary hypertension. We will order a repeat echocardiogram to reassess your condition.  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 This is managed with Prilosec twice daily.  -RAYNAUD'S DISEASE: Raynaud's disease causes some areas of your body, like fingers and toes, to feel numb and cold in response to cold temperatures or stress. This is managed with nifedipine.  -CHRONIC KIDNEY DISEASE: Chronic kidney disease means your kidneys are damaged and can't filter blood as well as they should. This is managed by your nephrologist.  INSTRUCTIONS:  1. Start taking Cellcept  twice daily as discussed. 2. Get the blood tests done before starting Cellcept . 3. We will schedule a lung function test in three months.  4. Monitor for any GI side effects and use Imodium  if necessary. 5. We will order a repeat echocardiogram to reassess your pulmonary hypertension.

## 2024-05-03 ENCOUNTER — Encounter: Payer: Self-pay | Admitting: Pulmonary Disease

## 2024-06-12 ENCOUNTER — Telehealth: Payer: Self-pay | Admitting: *Deleted

## 2024-06-12 DIAGNOSIS — M349 Systemic sclerosis, unspecified: Secondary | ICD-10-CM

## 2024-06-12 NOTE — Telephone Encounter (Signed)
 Spoke with pt, order placed and aware someone will call her to schedule.

## 2024-06-12 NOTE — Telephone Encounter (Signed)
-----   Message from [place] sent at 06/12/2024  2:46 PM EDT ----- Received message from Dr. Waylan Haggard of pulmonology service who recommended for the patient to undergo echocardiogram to evaluate for pulmonary hypertension given history of scleroderma, I am fine with ordering it, please notify patient and arrange.

## 2024-06-26 ENCOUNTER — Other Ambulatory Visit: Payer: Self-pay | Admitting: Medical

## 2024-06-28 ENCOUNTER — Ambulatory Visit: Payer: Self-pay

## 2024-06-28 NOTE — Telephone Encounter (Signed)
  FYI Only or Action Required?: Action required by provider: medication refill request and wants a medication call in to her pharmacy.  Patient was last seen in primary care on 04/07/2024 by Bulah Alm RAMAN, PA-C. Called Nurse Triage reporting Otalgia. Symptoms began several days ago. Interventions attempted: OTC medications: tylenol , benadryl  and sudafed. Symptoms are: unchanged.  Triage Disposition: See Physician Within 24 Hours  Patient/caregiver understands and will follow disposition?: No, wishes to speak with PCP   Doesn't want to be seen, want medication sent in to pharmacy, states PCP has seen her many times for this issue.    Copied from CRM [id number]. Topic: Clinical - Medical Advice >> Jun 28, 2024 11:52 AM Myrick T wrote: Reason for CRM: patient said she has pain in her left and right ear. The pain from her left ear radiates to her eyebrow and she's afraid it will cause a migraine Reason for Disposition  Earache  (Exceptions: brief ear pain of < 60 minutes duration, earache occurring during air travel  Answer Assessment - Initial Assessment Questions 1. LOCATION: Which ear is involved?     Both ears 2. ONSET: When did the ear start hurting      Monday 3. SEVERITY: How bad is the pain?  (Scale 1-10; mild, moderate or severe)   - MILD (1-3): doesn't interfere with normal activities    - MODERATE (4-7): interferes with normal activities or awakens from sleep    - SEVERE (8-10): excruciating pain, unable to do any normal activities      5/10 when hurting its not a constant pain just comes and goes  4. URI SYMPTOMS: Do you have a runny nose or cough?     no 5. FEVER: Do you have a fever? If Yes, ask: What is your temperature, how was it measured, and when did it start?     no 6. CAUSE: Have you been swimming recently?, How often do you use Q-TIPS?, Have you had any recent air travel or scuba diving?     no 7. OTHER SYMPTOMS: Do you have any other symptoms?  (e.g., headache, stiff neck, dizziness, vomiting, runny nose, decreased hearing)     no  Protocols used: Earache-A-AH

## 2024-06-28 NOTE — Telephone Encounter (Signed)
  Doesn't want to be seen, want medication sent in to pharmacy, states PCP has seen her many times for this issue.

## 2024-06-29 ENCOUNTER — Ambulatory Visit (INDEPENDENT_AMBULATORY_CARE_PROVIDER_SITE_OTHER): Admitting: Medical

## 2024-06-29 VITALS — BP 120/76 | HR 64 | Temp 97.6°F | Wt 108.0 lb

## 2024-06-29 DIAGNOSIS — H8302 Labyrinthitis, left ear: Secondary | ICD-10-CM

## 2024-06-29 DIAGNOSIS — Z79899 Other long term (current) drug therapy: Secondary | ICD-10-CM | POA: Diagnosis not present

## 2024-06-29 DIAGNOSIS — H938X2 Other specified disorders of left ear: Secondary | ICD-10-CM | POA: Diagnosis not present

## 2024-06-29 MED ORDER — AMOXICILLIN-POT CLAVULANATE 875-125 MG PO TABS: 1.0000 | ORAL_TABLET | Freq: Two times a day (BID) | ORAL | 0 refills | Status: AC

## 2024-06-29 NOTE — Progress Notes (Signed)
 Subjective:  [name] is a 68 y.o. female who presents for Chief Complaint  Patient presents with   ear pain    Ear pain started Monday. Both ears     Here for ear pain x 5 days, head was sore.  Now pain goes from eyebrow all the way to back of head.  No sinus pain.  No cough, no runny nose, no sore throat, but neck achy on left.  No fever.  No NVD.  No recent swimming.   Using some sudafed and flonase  in morning, benadryl  at night.  No other aggravating or relieving factors.    No other c/o.  The following portions of the patient's history were reviewed and updated as appropriate: allergies, current medications, past family history, past medical history, past social history, past surgical history and problem list.  ROS Otherwise as in subjective above    Objective: BP 120/76   Pulse 64   Temp 97.6 F (36.4 C)   Wt 108 lb (49 kg)   BMI 18.54 kg/m   General appearance: alert, no distress, well developed, well nourished HEENT: normocephalic, sclerae anicteric, conjunctiva pink and moist, right ear and TM normal-appearing, left TM with opaque fluid behind the eardrum and a 3 mm round area of translucent area suggestive of friability, otherwise, nares patent, no discharge or erythema, pharynx normal Oral cavity: MMM, no lesions Neck: supple, no lymphadenopathy, no thyromegaly, no masses Heart: RRR, normal S1, S2, no murmurs Lungs: CTA bilaterally, no wheezes, rhonchi, or rales   Assessment: Encounter Diagnoses  Name Primary?   Infection of left inner ear Yes   High risk medication use    Ear pressure, left      Plan: Begin antibiotic below.  We discussed her new medicine CellCept .  She has only been taking 1/2 tablet twice a day instead of the indicated 1 tablet twice a day.  Advise she follow-up with pulmonology on this.  In the meantime take the full tablet CellCept  twice a day as the antibiotic can alter the effectiveness of the medication short-term.  Continue your  Flonase  and Sudafed the next 2 days to help with decongestion.  If you happen to have ear drainage then follow-up in a week.  If no ear drainage and symptoms resolve then no need for follow-up in the short-term here.  Follow-up with your pulmonologist as planned  [name] was seen today for ear pain.  Diagnoses and all orders for this visit:  Infection of left inner ear  High risk medication use  Ear pressure, left  Other orders -     amoxicillin -clavulanate (AUGMENTIN ) 875-125 MG tablet; Take 1 tablet by mouth 2 (two) times daily.    Follow up: prn

## 2024-07-03 DIAGNOSIS — Z008 Encounter for other general examination: Secondary | ICD-10-CM | POA: Diagnosis not present

## 2024-07-03 DIAGNOSIS — H6692 Otitis media, unspecified, left ear: Secondary | ICD-10-CM | POA: Diagnosis not present

## 2024-07-07 DIAGNOSIS — M1990 Unspecified osteoarthritis, unspecified site: Secondary | ICD-10-CM | POA: Diagnosis not present

## 2024-07-07 DIAGNOSIS — N1831 Chronic kidney disease, stage 3a: Secondary | ICD-10-CM | POA: Diagnosis not present

## 2024-07-07 DIAGNOSIS — I7300 Raynaud's syndrome without gangrene: Secondary | ICD-10-CM | POA: Diagnosis not present

## 2024-07-07 DIAGNOSIS — M349 Systemic sclerosis, unspecified: Secondary | ICD-10-CM | POA: Diagnosis not present

## 2024-07-07 DIAGNOSIS — N1832 Chronic kidney disease, stage 3b: Secondary | ICD-10-CM | POA: Diagnosis not present

## 2024-07-07 DIAGNOSIS — J849 Interstitial pulmonary disease, unspecified: Secondary | ICD-10-CM | POA: Diagnosis not present

## 2024-07-26 DIAGNOSIS — I129 Hypertensive chronic kidney disease with stage 1 through stage 4 chronic kidney disease, or unspecified chronic kidney disease: Secondary | ICD-10-CM | POA: Diagnosis not present

## 2024-07-26 DIAGNOSIS — M898X9 Other specified disorders of bone, unspecified site: Secondary | ICD-10-CM | POA: Diagnosis not present

## 2024-07-26 DIAGNOSIS — D631 Anemia in chronic kidney disease: Secondary | ICD-10-CM | POA: Diagnosis not present

## 2024-07-26 DIAGNOSIS — N1832 Chronic kidney disease, stage 3b: Secondary | ICD-10-CM | POA: Diagnosis not present

## 2024-07-28 ENCOUNTER — Ambulatory Visit: Admitting: Pulmonary Disease

## 2024-07-28 ENCOUNTER — Encounter: Payer: Self-pay | Admitting: Pulmonary Disease

## 2024-07-28 VITALS — BP 118/60 | HR 67 | Temp 98.0°F | Ht 64.0 in | Wt 109.0 lb

## 2024-07-28 DIAGNOSIS — Z5181 Encounter for therapeutic drug level monitoring: Secondary | ICD-10-CM | POA: Diagnosis not present

## 2024-07-28 DIAGNOSIS — R0689 Other abnormalities of breathing: Secondary | ICD-10-CM

## 2024-07-28 DIAGNOSIS — R0600 Dyspnea, unspecified: Secondary | ICD-10-CM | POA: Diagnosis not present

## 2024-07-28 DIAGNOSIS — J849 Interstitial pulmonary disease, unspecified: Secondary | ICD-10-CM

## 2024-07-28 LAB — PULMONARY FUNCTION TEST
DL/VA % pred: 88 %
DL/VA: 3.7 ml/min/mmHg/L
DLCO unc % pred: 54 %
DLCO unc: 10.75 ml/min/mmHg
FEF 25-75 Post: 2.63 L/s
FEF 25-75 Pre: 2.67 L/s
FEF2575-%Change-Post: -1 %
FEF2575-%Pred-Post: 125 %
FEF2575-%Pred-Pre: 127 %
FEV1-%Change-Post: 0 %
FEV1-%Pred-Post: 69 %
FEV1-%Pred-Pre: 69 %
FEV1-Post: 1.67 L
FEV1-Pre: 1.67 L
FEV1FVC-%Change-Post: 0 %
FEV1FVC-%Pred-Pre: 123 %
FEV6-%Change-Post: -1 %
FEV6-%Pred-Post: 57 %
FEV6-%Pred-Pre: 58 %
FEV6-Post: 1.74 L
FEV6-Pre: 1.76 L
FEV6FVC-%Pred-Post: 104 %
FEV6FVC-%Pred-Pre: 104 %
FVC-%Change-Post: -1 %
FVC-%Pred-Post: 55 %
FVC-%Pred-Pre: 56 %
FVC-Post: 1.74 L
FVC-Pre: 1.76 L
Post FEV1/FVC ratio: 96 %
Post FEV6/FVC ratio: 100 %
Pre FEV1/FVC ratio: 95 %
Pre FEV6/FVC Ratio: 100 %
RV % pred: 106 %
RV: 2.25 L
TLC % pred: 77 %
TLC: 3.93 L

## 2024-07-28 LAB — HEPATIC FUNCTION PANEL
ALT: 9 U/L (ref 0–35)
AST: 20 U/L (ref 0–37)
Albumin: 4.4 g/dL (ref 3.5–5.2)
Alkaline Phosphatase: 49 U/L (ref 39–117)
Bilirubin, Direct: 0.1 mg/dL (ref 0.0–0.3)
Total Bilirubin: 0.3 mg/dL (ref 0.2–1.2)
Total Protein: 7.7 g/dL (ref 6.0–8.3)

## 2024-07-28 NOTE — Patient Instructions (Addendum)
  VISIT SUMMARY: Today, you had a follow-up appointment to review your pulmonary function and discuss your ongoing management for scleroderma, chronic kidney disease, Raynaud's phenomenon, and pulmonary hypertension. We also reviewed your current medications and their effectiveness.  YOUR PLAN: -SYSTEMIC SCLEROSIS (SCLERODERMA): Scleroderma is a chronic connective tissue disease. You are currently managing it with mycophenolate  mofetil (CellCept ) at a dose of 500 mg twice daily, which you tolerate well. We will continue this medication and order a hepatic panel to monitor your liver function.  -CHRONIC KIDNEY DISEASE: Chronic kidney disease is a condition where the kidneys gradually lose function. Your kidney function has shown improvement, with a recent creatinine level of 1.33. You should continue to see Dr. Tobie for monitoring and continue taking lisinopril  for renal protection.  -RAYNAUD'S PHENOMENON: Raynaud's phenomenon is a condition where smaller arteries that supply blood to your skin constrict excessively in response to cold or stress. You are managing this with amlodipine , and you should continue this medication as prescribed by Dr. Steven.  -PULMONARY HYPERTENSION: Pulmonary hypertension is high blood pressure in the arteries of your lungs. It was identified in a 2024 echocardiogram and may be related to a past pneumonia episode. You have a follow-up echocardiogram scheduled next week, and we will review the results to monitor your condition. The results will also be sent to your pulmonary specialist.  INSTRUCTIONS: Please continue with your current medications as discussed. Ensure you attend your follow-up echocardiogram next week. We will review the results and send them to your pulmonary specialist. Additionally, we will order a hepatic panel to monitor your liver function. Continue to see Dr. Tobie for your chronic kidney disease  management.                      Contains text generated by Abridge.                                 Contains text generated by Abridge.

## 2024-07-28 NOTE — Progress Notes (Deleted)
 Full pft performed today.

## 2024-07-28 NOTE — Progress Notes (Signed)
 [name]    [id number]    01-31-1956  Primary Care Physician:Tysinger, Alm RAMAN, PA-C  Referring Physician: Leighton Luster, MD [address] [place],  [place] [address]  Chief complaint: Follow-up for for interstitial lung disease  HPI: 68 y.o. who  has a past medical history of Allergic rhinitis, Asthma, Atherosclerosis, Chronic kidney disease (CKD), stage III (moderate) (HCC), Esophageal stricture, GERD (gastroesophageal reflux disease), History of anemia due to chronic kidney disease, History of colitis (2017), Hypertension, Metabolic bone disease, Raynaud disease, and Scleroderma (HCC).   History of scleroderma, Raynaud's disease diagnosed with skin biopsy in 2004.  She was on methotrexate from 2000 03/29/2004 and currently not on treatment.  Follows with Dr. Curt from rheumatology  History also significant for asthma, esophageal dysmotility and stricture.  She was previously followed by Dr. Abran and had esophageal dilatation in the past.  Currently on Prilosec and she is managing with elevation of the head of the bed.  Followed with Dr. Shelah.  She has been referred for evaluation of abnormal CT  Pets: Dogs, [place] pigs Occupation: Used to work in [employer] in her history.  Currently on disability Exposures: No mold, hot tub, Jacuzzi.  No feather pillows or comforters Smoking history: Never smoker Travel history: No significant travel history Relevant family history: No family history of lung disease  Interim history: Discussed the use of AI scribe software for clinical note transcription with the patient, who gave verbal consent to proceed.  History of Present Illness [name] is a 68 year old female with scleroderma and ILD  who presents for a pulmonary function test.  Pulmonary function and respiratory symptoms - Breathing is stable - Echocardiogram in 2024 showed mild pulmonary hypertension, coinciding with hospitalization for  pneumonia - Scheduled for another echocardiogram next week  Scleroderma and immunosuppressive therapy - Diagnosed with scleroderma - Currently taking CellCept , started in April, at a dose of 500 mg twice daily - Tolerates CellCept  well, though experiences some gastrointestinal symptoms necessitating a lower dose  Renal function and chronic kidney disease - Chronic kidney disease with recent improvement - Most recent creatinine level is 1.33 - Takes lisinopril  for renal protection  Raynaud's phenomenon and blood pressure - Takes amlodipine  primarily for Raynaud's phenomenon - No history of hypertension; blood pressure is usually low    Outpatient Encounter Medications as of 07/28/2024  Medication Sig   acetaminophen  (TYLENOL ) 325 MG tablet Take 325 mg by mouth every 6 (six) hours as needed for mild pain.   albuterol  (PROVENTIL ) (2.5 MG/3ML) 0.083% nebulizer solution Take 3 mLs (2.5 mg total) by nebulization every 6 (six) hours as needed for wheezing or shortness of breath.   albuterol  (VENTOLIN  HFA) 108 (90 Base) MCG/ACT inhaler Inhale 2 puffs into the lungs every 6 (six) hours as needed for wheezing or shortness of breath.   amLODipine  (NORVASC ) 5 MG tablet Take 5 mg by mouth daily.    amoxicillin -clavulanate (AUGMENTIN ) 875-125 MG tablet Take 1 tablet by mouth 2 (two) times daily.   diphenhydrAMINE  (BENADRYL ) 25 mg capsule Take 25 mg by mouth every 6 (six) hours as needed for allergies.   fluticasone  (FLONASE ) 50 MCG/ACT nasal spray Place 1 spray into both nostrils daily.   fluticasone  furoate-vilanterol (BREO ELLIPTA ) 200-25 MCG/ACT AEPB USE 1 INHALATION ORALLY    DAILY   levothyroxine  (SYNTHROID ) 75 MCG tablet Take 1 tablet (75 mcg total) by mouth daily.   lisinopril  (ZESTRIL ) 5  MG tablet Take 5 mg by mouth daily.   mycophenolate  (CELLCEPT ) 500 MG tablet Take 1 tablet (500 mg total) by mouth 2 (two) times daily.   omeprazole (PRILOSEC) 20 MG capsule Take 20 mg by mouth 2 (two) times  daily before a meal.   pseudoephedrine  (SUDAFED) 120 MG 12 hr tablet Take 120 mg by mouth daily.   [DISCONTINUED] famotidine  (PEPCID ) 20 MG tablet Take 20 mg by mouth 2 (two) times daily.     No facility-administered encounter medications on file as of 07/28/2024.   Physical Exam: Vitals:   07/28/24 1036  BP: 118/60  Pulse: 67  Temp: 98 F (36.7 C)  Height: 5' 4 (1.626 m)  Weight: 109 lb (49.4 kg)  SpO2: 100%  TempSrc: Oral  BMI (Calculated): 18.7    Physical Exam MEASUREMENTS: Weight- 49. GEN: No acute distress CV: Regular rate and rhythm no murmurs LUNGS: Clear to auscultation bilaterally normal respiratory effort SKIN JOINTS: Warm and dry no rash    Data Reviewed: Imaging: CT chest 12/05/2019-increasing groundglass opacities in the upper lobes.  CT chest 05/30/2020-resolution of groundglass, septal thickening with nodules in the bilateral upper lobes.  CTA 12/23/2020-no pulmonary embolism, resolution of multifocal lung nodules and groundglass in the upper lobe.  Subpleural reticulation scattered bronchiectasis.  CT high-resolution 07/10/2022-patchy areas of groundglass attenuation and septal thickening with few areas of mild cylindrical bronchiectasis and peripheral bronchiolectasis with no gradient.  Alternate diagnosis.  Patulous fluid-filled esophagus, coronary and aortic atherosclerosis.  CT high-resolution 02/11/2023-lower lung predominant fibrotic interstitial lung disease which is stable.  Resolution of left upper lobe groundglass opacities.  Dilated esophagus with layering debris's.  CT high-resolution 03/29/2024-fibrotic interstitial lung disease likely progressive from 2024.  Probable UIP  I have reviewed the images personally.  PFTs: 05/15/2022 FEV1 1.72 [70%] FVC 1.88 [58%], F/F 91, TLC 3.90 [77%], DLCO 10.55 [50%] Restrictive lung disease, diffusion impairment.  05/07/2023 FVC 1.71 [54%], FEV1 1.66 [68%], F/F97, TLC 3.81 [95%], DLCO 13.91 [70%] Moderate  restriction defect, mild diffusion impairment  07/28/2024 FVC 1.74 [55%], FEV1 1.69 [69%], F/F96, TLC 3.93 [77%], DLCO 10.95 [54%] Moderate or severe restriction, moderate diffusion defect  Labs:   Assessment & Plan Systemic sclerosis (scleroderma) on mycophenolate  mofetil therapy Scleroderma is managed with mycophenolate  mofetil (CellCept ) 500 mg twice daily since April. She tolerates the medication well, and her weight of 49 kg supports maintaining the current dose. Gastrointestinal side effects preclude a higher dose. - Continue mycophenolate  mofetil (CellCept ) 500 mg twice daily - Order hepatic panel to monitor liver function.  Reviewed CBC and BMP from her nephrologist which were done last week and within normal limits except for elevated creatinine  Chronic kidney disease Chronic kidney disease is monitored by Dr. Tobie with improvement noted at a creatinine level of 1.33. Lisinopril  is prescribed for renal protection. - Continue monitoring kidney function with Dr. Tobie  Raynaud's phenomenon Raynaud's phenomenon is managed with amlodipine , prescribed by Dr. Curt, rheumatology without a history of hypertension. - Continue amlodipine  for Raynaud's phenomenon  Pulmonary hypertension (history of, under evaluation) Mild pulmonary hypertension was identified in the 2024 echocardiogram, possibly related to a past pneumonia episode during hospitalization. 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 Managed with Prilosec twice daily.   Plan/Recommendations: Continue CellCept , labs for monitoring Follow-up on echocardiogram  Lonna Coder MD [facility] 07/28/2024, 10:56 AM  CC: Karime Scheuermann, MD

## 2024-07-28 NOTE — [facility] Notes (Signed)
 Full pft performed today.

## 2024-07-28 NOTE — Patient Instructions (Signed)
 Full pft performed today.

## 2024-08-03 ENCOUNTER — Other Ambulatory Visit: Payer: Self-pay | Admitting: Medical

## 2024-08-03 MED ORDER — FLUTICASONE FUROATE-VILANTEROL 200-25 MCG/ACT IN AEPB: 1.0000 | INHALATION_SPRAY | Freq: Every day | RESPIRATORY_TRACT | 0 refills | Status: DC

## 2024-08-03 NOTE — Telephone Encounter (Signed)
 Copied from CRM [id number]. Topic: Clinical - Medication Refill >> Aug 03, 2024  2:45 PM Donna BRAVO wrote: Patient calling stating, insurance need an authorization     Medication:  fluticasone  furoate-vilanterol (BREO ELLIPTA ) 200-25 MCG/ACT AEPB This medication has been discontinued   Has the patient contacted their pharmacy? No  This is the patient's preferred pharmacy:   [facility] - [place], [place] - [address] [facility] AT Portal to [facility] [address] [place] [address] Phone: [phone] Fax: [phone]  Is this the correct pharmacy for this prescription? Yes If no, delete pharmacy and type the correct one.   Has the prescription been filled recently? Yes  Is the patient out of the medication? No  Has the patient been seen for an appointment in the last year OR does the patient have an upcoming appointment? Yes  Can we respond through MyChart? No  Agent: Please be advised that Rx refills may take up to 3 business days. We ask that you follow-up with your pharmacy.

## 2024-08-04 ENCOUNTER — Ambulatory Visit (HOSPITAL_COMMUNITY)
Admission: RE | Admit: 2024-08-04 | Discharge: 2024-08-04 | Disposition: A | Discharge status: Home / Self Care | Source: Ambulatory Visit | Attending: Cardiovascular Disease | Admitting: Cardiovascular Disease

## 2024-08-04 DIAGNOSIS — M349 Systemic sclerosis, unspecified: Secondary | ICD-10-CM | POA: Insufficient documentation

## 2024-08-04 LAB — ECHOCARDIOGRAM COMPLETE
Area-P 1/2: 4.49 cm2
P 1/2 time: 476 ms
S' Lateral: 2.5 cm

## 2024-08-08 ENCOUNTER — Ambulatory Visit: Payer: Self-pay | Admitting: Physician Assistant

## 2024-08-08 ENCOUNTER — Ambulatory Visit: Payer: Self-pay | Admitting: Pulmonary Disease

## 2024-08-09 NOTE — Telephone Encounter (Signed)
 Patient needs a PA done on this. Not sure if this has already been processed yesterday through the PA or not. Please update patient     opied from CRM [id number]. Topic: Clinical - Medication Question >> Aug 09, 2024  8:17 AM Myrick T wrote: Reason for CRM: patient called to f/u on the PA for fluticasone  furoate-vilanterol (BREO ELLIPTA ) 200-25 MCG/ACT AEPB. Please f/u with patient to let her know where we are in the process.

## 2024-08-11 ENCOUNTER — Telehealth: Payer: Self-pay

## 2024-08-11 ENCOUNTER — Other Ambulatory Visit (HOSPITAL_COMMUNITY): Payer: Self-pay

## 2024-08-11 NOTE — Telephone Encounter (Signed)
    Pts Mail Order Pharmacy Has been contacted. No P/A is needed. Please see T/C below as well as the note.

## 2024-08-30 NOTE — Progress Notes (Signed)
 Pt has been made aware of normal result and verbalized understanding.  jw

## 2024-09-06 ENCOUNTER — Telehealth: Payer: Self-pay | Admitting: Medical

## 2024-09-06 NOTE — Telephone Encounter (Unsigned)
 Copied from CRM [id number]. Topic: Clinical - Medication Refill >> Sep 06, 2024  9:26 AM Chasity T wrote: Medication: fluticasone  furoate-vilanterol (BREO ELLIPTA ) 200-25 MCG/ACT AEPB   Has the patient contacted their pharmacy? Yes  This is the patient's preferred pharmacy:   [facility] - [place], [place] - [address] [place] [address] Phone: [phone] Fax: [phone]    Is this the correct pharmacy for this prescription? Yes If no, delete pharmacy and type the correct one.   Has the prescription been filled recently? Yes  Is the patient out of the medication? Yes  (Patient only has 7 days left and mentions she needs to take it everyday.)  Has the patient been seen for an appointment in the last year OR does the patient have an upcoming appointment? Yes  Can we respond through MyChart? Yes  Agent: Please be advised that Rx refills may take up to 3 business days. We ask that you follow-up with your pharmacy.

## 2024-09-15 MED ORDER — FLUTICASONE FUROATE-VILANTEROL 200-25 MCG/ACT IN AEPB: 1.0000 | INHALATION_SPRAY | Freq: Every day | RESPIRATORY_TRACT | 0 refills | Status: DC

## 2024-10-06 ENCOUNTER — Telehealth: Payer: Self-pay | Admitting: Medical

## 2024-10-06 NOTE — Telephone Encounter (Signed)
 Pt stopped by office states been having issues getting her Ranell, she did get it in September but they only gave her 1 inhaler & it was supposed to go to [employer] order.  I told her that her insurance may only allow 30 days at local pharmacy & that is why she wants it sent to mail order.  And she would like refills, she does not like the call center & having to call monthly so she came into the office.  (Gave her the number for Patient Experience)  Need Breo 90 days to [facility] with refills

## 2024-10-08 ENCOUNTER — Other Ambulatory Visit: Payer: Self-pay | Admitting: Medical

## 2024-10-08 MED ORDER — FLUTICASONE FUROATE-VILANTEROL 200-25 MCG/ACT IN AEPB: 1.0000 | INHALATION_SPRAY | Freq: Every day | RESPIRATORY_TRACT | 2 refills | Status: DC

## 2024-10-12 ENCOUNTER — Telehealth: Payer: Self-pay | Admitting: Internal Medicine

## 2024-10-12 ENCOUNTER — Other Ambulatory Visit (HOSPITAL_COMMUNITY): Payer: Self-pay

## 2024-10-12 ENCOUNTER — Telehealth: Payer: Self-pay

## 2024-10-12 MED ORDER — FLUTICASONE FUROATE-VILANTEROL 200-25 MCG/ACT IN AEPB: 1.0000 | INHALATION_SPRAY | Freq: Every day | RESPIRATORY_TRACT | 1 refills | Status: AC

## 2024-10-12 NOTE — Telephone Encounter (Signed)
 [facility]    Copied from CRM #[id number]. Topic: Clinical - Medication Prior Auth >> Oct 12, 2024  8:30 AM Suzen RAMAN wrote: Reason for CRM: Per patient she needs a prior authorization completed for fluticasone  furoate-vilanterol (BREO ELLIPTA ) 200-25 MCG/ACT AEPB per her insurance.    [id number]

## 2024-10-12 NOTE — Telephone Encounter (Signed)
 Called [facility] and [facility], verified this is correct [facility]

## 2024-10-12 NOTE — Telephone Encounter (Signed)
 Request:        No P/a is needed. I have contacted the pts pref'd pharmacy that under the ''[facility]'' and its labeled ''[facility]'' which is [employer]. The pts benefits ended with that plan back on 12.31.24. so the script will have to be sent to alt pharmacy of the pts choice. [facility] will only fill their own rx's which is [employer]. The pt is nolonger with that plan. I see she last had her Rx filled at [facility] on 9/19. Once pharmacy is switched the cost should be around/about 5.00

## 2025-01-09 ENCOUNTER — Ambulatory Visit: Admitting: *Deleted

## 2025-01-09 VITALS — Ht 64.0 in | Wt 109.0 lb

## 2025-01-09 DIAGNOSIS — Z0000 Encounter for general adult medical examination without abnormal findings: Secondary | ICD-10-CM

## 2025-01-09 NOTE — Progress Notes (Signed)
 "  Chief Complaint  Patient presents with   Medicare Wellness     Subjective:   [name] is a 69 y.o. female who presents for a Medicare Annual Wellness Visit.  No voiced or noted concerns at this time   Visit info / Clinical Intake: Medicare Wellness Visit Type:: Subsequent Annual Wellness Visit Persons participating in visit and providing information:: patient Medicare Wellness Visit Mode:: Telephone If telephone:: video declined Since this visit was completed virtually, some vitals may be partially provided or unavailable. Missing vitals are due to the limitations of the virtual format.: Unable to obtain vitals - no equipment If Telephone or Video please confirm:: I connected with patient using audio/video enable telemedicine. I verified patient identity with two identifiers, discussed telehealth limitations, and patient agreed to proceed. Patient Location:: home Provider Location:: home Interpreter Needed?: No Pre-visit prep was completed: no AWV questionnaire completed by patient prior to visit?: no Living arrangements:: (!) lives alone Patient's Overall Health Status Rating: good Typical amount of pain: none Does pain affect daily life?: no Are you currently prescribed opioids?: no  Dietary Habits and Nutritional Risks How many meals a day?: 3 Eats fruit and vegetables daily?: yes Most meals are obtained by: preparing own meals In the last 2 weeks, have you had any of the following?: none Diabetic:: no  Functional Status Activities of Daily Living (to include ambulation/medication): Independent Ambulation: Independent Medication Administration: Independent Home Management (perform basic housework or laundry): Independent Manage your own finances?: yes Primary transportation is: driving Concerns about hearing?: no  Fall Screening Falls in the past year?: 0 Number of falls in past year: 0 Was there an injury with Fall?: 0 Fall Risk Category Calculator:  0 Patient Fall Risk Level: Low Fall Risk  Fall Risk Patient at Risk for Falls Due to: No Fall Risks Fall risk Follow up: Falls evaluation completed; Education provided; Falls prevention discussed  Home and Transportation Safety: All rugs have non-skid backing?: N/A, no rugs All stairs or steps have railings?: N/A, no stairs Grab bars in the bathtub or shower?: yes Have non-skid surface in bathtub or shower?: (!) no Good home lighting?: yes Regular seat belt use?: yes Hospital stays in the last year:: no  Cognitive Assessment Difficulty concentrating, remembering, or making decisions? : no Will 6CIT or Mini Cog be Completed: yes What year is it?: 0 points What month is it?: 0 points Give patient an address phrase to remember (5 components): Its very sunny outside today in January About what time is it?: 0 points Count backwards from 20 to 1: 0 points Say the months of the year in reverse: 0 points Repeat the address phrase from earlier: 0 points 6 CIT Score: 0 points  Advance Directives (For Healthcare) Does Patient Have a Medical Advance Directive?: No Would patient like information on creating a medical advance directive?: No - Patient declined  Reviewed/Updated  Reviewed/Updated: Reviewed All (Medical, Surgical, Family, Medications, Allergies, Care Teams, Patient Goals); Surgical History; Family History; Medications; Allergies; Care Teams; Patient Goals; Medical History    Allergies (verified) Avelox [moxifloxacin hcl in nacl], Bactrim  [sulfamethoxazole -trimethoprim ], and Codeine   Current Medications (verified) Outpatient Encounter Medications as of 01/09/2025  Medication Sig   acetaminophen  (TYLENOL ) 325 MG tablet Take 325 mg by mouth every 6 (six) hours as needed for mild pain.   albuterol  (PROVENTIL ) (2.5 MG/3ML) 0.083% nebulizer solution Take 3 mLs (2.5 mg total) by nebulization every 6 (six) hours as needed for wheezing or shortness of breath.  albuterol  (VENTOLIN   HFA) 108 (90 Base) MCG/ACT inhaler Inhale 2 puffs into the lungs every 6 (six) hours as needed for wheezing or shortness of breath.   amLODipine  (NORVASC ) 5 MG tablet Take 5 mg by mouth daily.    amoxicillin -clavulanate (AUGMENTIN ) 875-125 MG tablet Take 1 tablet by mouth 2 (two) times daily.   diphenhydrAMINE  (BENADRYL ) 25 mg capsule Take 25 mg by mouth every 6 (six) hours as needed for allergies.   fluticasone  (FLONASE ) 50 MCG/ACT nasal spray Place 1 spray into both nostrils daily.   fluticasone  furoate-vilanterol (BREO ELLIPTA ) 200-25 MCG/ACT AEPB Inhale 1 puff into the lungs daily. USE 1 INHALATION ORALLY    DAILY   levothyroxine  (SYNTHROID ) 75 MCG tablet Take 1 tablet (75 mcg total) by mouth daily.   lisinopril  (ZESTRIL ) 5 MG tablet Take 5 mg by mouth daily.   mycophenolate  (CELLCEPT ) 500 MG tablet Take 1 tablet (500 mg total) by mouth 2 (two) times daily.   omeprazole (PRILOSEC) 20 MG capsule Take 20 mg by mouth 2 (two) times daily before a meal.   pseudoephedrine  (SUDAFED) 120 MG 12 hr tablet Take 120 mg by mouth daily.   [DISCONTINUED] famotidine  (PEPCID ) 20 MG tablet Take 20 mg by mouth 2 (two) times daily.     No facility-administered encounter medications on file as of 01/09/2025.    History: Past Medical History:  Diagnosis Date   Allergic rhinitis    Asthma    Atherosclerosis    Chronic kidney disease (CKD), stage III (moderate) (HCC)    Esophageal stricture    GERD (gastroesophageal reflux disease)    History of anemia due to chronic kidney disease    History of colitis 2017   Hypertension    Metabolic bone disease    Raynaud disease    Scleroderma (HCC)    Past Surgical History:  Procedure Laterality Date   CESAREAN SECTION     COLONOSCOPY WITH PROPOFOL  N/A 07/14/2016   Procedure: COLONOSCOPY WITH PROPOFOL ;  Surgeon: Toribio SHAUNNA Cedar, MD;  Location: [facility];  Service: Endoscopy;  Laterality: N/A;   HAND SURGERY     left; tendon repair   TONSILLECTOMY      Family History  Problem Relation Age of Onset   Heart failure Mother    Hepatitis Mother        C   Dementia Mother    COPD Mother        emphysema   Cancer Father        lung with mets to brain   Diabetes Sister    Cancer Sister        leukemia   Hepatitis Brother    Graves' disease Daughter    Pseudotumor cerebri Daughter    Colon cancer Neg Hx    Social History   Occupational History   Occupation: retired  Tobacco Use   Smoking status: Never   Smokeless tobacco: Never  Vaping Use   Vaping status: Never Used  Substance and Sexual Activity   Alcohol use: No   Drug use: No   Sexual activity: Not Currently   Tobacco Counseling Counseling given: Not Answered  SDOH Screenings   Food Insecurity: No Food Insecurity (01/09/2025)  Housing: Unknown (01/09/2025)  Transportation Needs: No Transportation Needs (01/09/2025)  Utilities: Not At Risk (01/09/2025)  Alcohol Screen: Low Risk (01/04/2024)  Depression (PHQ2-9): Low Risk (01/09/2025)  Financial Resource Strain: Low Risk (01/04/2024)  Physical Activity: Inactive (01/09/2025)  Social Connections: Moderately Isolated (01/09/2025)  Stress: No Stress Concern  Present (01/09/2025)  Tobacco Use: Low Risk (01/09/2025)  Health Literacy: Adequate Health Literacy (01/09/2025)   See flowsheets for full screening details  Depression Screen PHQ 2 & 9 Depression Scale- Over the past 2 weeks, how often have you been bothered by any of the following problems? Little interest or pleasure in doing things: 0 Feeling down, depressed, or hopeless (PHQ Adolescent also includes...irritable): 0 PHQ-2 Total Score: 0 Trouble falling or staying asleep, or sleeping too much: 0 Feeling tired or having little energy: 0 Poor appetite or overeating (PHQ Adolescent also includes...weight loss): 0 Feeling bad about yourself - or that you are a failure or have let yourself or your family down: 0 Trouble concentrating on things, such as reading the  newspaper or watching television (PHQ Adolescent also includes...like school work): 0 Moving or speaking so slowly that other people could have noticed. Or the opposite - being so fidgety or restless that you have been moving around a lot more than usual: 0 Thoughts that you would be better off dead, or of hurting yourself in some way: 0 PHQ-9 Total Score: 0 If you checked off any problems, how difficult have these problems made it for you to do your work, take care of things at home, or get along with other people?: Not difficult at all     Goals Addressed             This Visit's Progress    Patient Stated       Maintain current lifestyle             Objective:    Today's Vitals   01/09/25 0928  Weight: 109 lb (49.4 kg)  Height: 5' 4 (1.626 m)   Body mass index is 18.71 kg/m.  Hearing/Vision screen Hearing Screening - Comments:: No trouble hearing Vision Screening - Comments:: [place]  [place] Up to date Immunizations and Health Maintenance Health Maintenance  Topic Date Due   Zoster Vaccines- Shingrix (1 of 2) Never done   Mammogram  03/03/2020   COVID-19 Vaccine (4 - 2025-26 season) 08/28/2024   Medicare Annual Wellness (AWV)  01/09/2026   Colonoscopy  07/14/2026   Pneumococcal Vaccine: 50+ Years  Completed   Influenza Vaccine  Completed   Bone Density Scan  Completed   Hepatitis C Screening  Completed   Meningococcal B Vaccine  Aged Out   DTaP/Tdap/Td  Discontinued        Assessment/Plan:  This is a routine wellness examination for Eyvette.  Patient Care Team: Tysinger, Alm RAMAN, PA-C as PCP - General (Family Medicine) Court Dorn PARAS, MD as PCP - Cardiology (Cardiology) Curt Lighter, MD as Consulting Physician (Rheumatology) Shelah Lamar RAMAN, MD as Consulting Physician (Pulmonary Disease) Tobie Gordy POUR, MD as Consulting Physician (Nephrology)  I have personally reviewed and noted the following in the patients chart:   Medical and social  history Use of alcohol, tobacco or illicit drugs  Current medications and supplements including opioid prescriptions. Functional ability and status Nutritional status Physical activity Advanced directives List of other physicians Hospitalizations, surgeries, and ER visits in previous 12 months Vitals Screenings to include cognitive, depression, and falls Referrals and appointments  No orders of the defined types were placed in this encounter.  In addition, I have reviewed and discussed with patient certain preventive protocols, quality metrics, and best practice recommendations. A written personalized care plan for preventive services as well as general preventive health recommendations were provided to patient.   Luis Nickles, LPN  01/09/2025   Return in 1 year (on 01/09/2026).  After Visit Summary: (MyChart) Due to this being a telephonic visit, the after visit summary with patients personalized plan was offered to patient via MyChart   Nurse Notes:  "

## 2025-01-09 NOTE — Patient Instructions (Signed)
 [name] , Thank you for taking time to come for your Medicare Wellness Visit. I appreciate your ongoing commitment to your health goals. Please review the following plan we discussed and let me know if I can assist you in the future.   Screening recommendations/referrals: Colonoscopy:  Mammogram:  Bone Density:  Recommended yearly ophthalmology/optometry visit for glaucoma screening and checkup Recommended yearly dental visit for hygiene and checkup  Vaccinations: Influenza vaccine:  Pneumococcal vaccine:  Tdap vaccine:  Shingles vaccine:        Preventive Care 68 Years and Older, Female Preventive care refers to lifestyle choices and visits with your health care provider that can promote health and wellness. What does preventive care include? A yearly physical exam. This is also called an annual well check. Dental exams once or twice a year. Routine eye exams. Ask your health care provider how often you should have your eyes checked. Personal lifestyle choices, including: Daily care of your teeth and gums. Regular physical activity. Eating a healthy diet. Avoiding tobacco and drug use. Limiting alcohol use. Practicing safe sex. Taking low-dose aspirin every day. Taking vitamin and mineral supplements as recommended by your health care provider. What happens during an annual well check? The services and screenings done by your health care provider during your annual well check will depend on your age, overall health, lifestyle risk factors, and family history of disease. Counseling  Your health care provider may ask you questions about your: Alcohol use. Tobacco use. Drug use. Emotional well-being. Home and relationship well-being. Sexual activity. Eating habits. History of falls. Memory and ability to understand (cognition). Work and work astronomer. Reproductive health. Screening  You may have the following tests or measurements: Height, weight, and  BMI. Blood pressure. Lipid and cholesterol levels. These may be checked every 5 years, or more frequently if you are over 68 years old. Skin check. Lung cancer screening. You may have this screening every year starting at age 68 if you have a 30-pack-year history of smoking and currently smoke or have quit within the past 15 years. Fecal occult blood test (FOBT) of the stool. You may have this test every year starting at age 68. Flexible sigmoidoscopy or colonoscopy. You may have a sigmoidoscopy every 5 years or a colonoscopy every 10 years starting at age 68. Hepatitis C blood test. Hepatitis B blood test. Sexually transmitted disease (STD) testing. Diabetes screening. This is done by checking your blood sugar (glucose) after you have not eaten for a while (fasting). You may have this done every 1-3 years. Bone density scan. This is done to screen for osteoporosis. You may have this done starting at age 68. Mammogram. This may be done every 68 years. Talk to your health care provider about how often you should have regular mammograms. Talk with your health care provider about your test results, treatment options, and if necessary, the need for more tests. Vaccines  Your health care provider may recommend certain vaccines, such as: Influenza vaccine. This is recommended every year. Tetanus, diphtheria, and acellular pertussis (Tdap, Td) vaccine. You may need a Td booster every 10 years. Zoster vaccine. You may need this after age 68. Pneumococcal 13-valent conjugate (PCV13) vaccine. One dose is recommended after age 68. Pneumococcal polysaccharide (PPSV23) vaccine. One dose is recommended after age 68. Talk to your health care provider about which screenings and vaccines you need and how often you need them. This information is not intended to replace advice given to you by your health  care provider. Make sure you discuss any questions you have with your health care provider. Document  Released: 01/10/2016 Document Revised: 09/02/2016 Document Reviewed: 10/15/2015 Elsevier Interactive Patient Education  2017 [employer].  Fall Prevention in the Home Falls can cause injuries. They can happen to people of all ages. There are many things you can do to make your home safe and to help prevent falls. What can I do on the outside of my home? Regularly fix the edges of walkways and driveways and fix any cracks. Remove anything that might make you trip as you walk through a door, such as a raised step or threshold. Trim any bushes or trees on the path to your home. Use bright outdoor lighting. Clear any walking paths of anything that might make someone trip, such as rocks or tools. Regularly check to see if handrails are loose or broken. Make sure that both sides of any steps have handrails. Any raised decks and porches should have guardrails on the edges. Have any leaves, snow, or ice cleared regularly. Use sand or salt on walking paths during winter. Clean up any spills in your garage right away. This includes oil or grease spills. What can I do in the bathroom? Use night lights. Install grab bars by the toilet and in the tub and shower. Do not use towel bars as grab bars. Use non-skid mats or decals in the tub or shower. If you need to sit down in the shower, use a plastic, non-slip stool. Keep the floor dry. Clean up any water that spills on the floor as soon as it happens. Remove soap buildup in the tub or shower regularly. Attach bath mats securely with double-sided non-slip rug tape. Do not have throw rugs and other things on the floor that can make you trip. What can I do in the bedroom? Use night lights. Make sure that you have a light by your bed that is easy to reach. Do not use any sheets or blankets that are too big for your bed. They should not hang down onto the floor. Have a firm chair that has side arms. You can use this for support while you get dressed. Do  not have throw rugs and other things on the floor that can make you trip. What can I do in the kitchen? Clean up any spills right away. Avoid walking on wet floors. Keep items that you use a lot in easy-to-reach places. If you need to reach something above you, use a strong step stool that has a grab bar. Keep electrical cords out of the way. Do not use floor polish or wax that makes floors slippery. If you must use wax, use non-skid floor wax. Do not have throw rugs and other things on the floor that can make you trip. What can I do with my stairs? Do not leave any items on the stairs. Make sure that there are handrails on both sides of the stairs and use them. Fix handrails that are broken or loose. Make sure that handrails are as long as the stairways. Check any carpeting to make sure that it is firmly attached to the stairs. Fix any carpet that is loose or worn. Avoid having throw rugs at the top or bottom of the stairs. If you do have throw rugs, attach them to the floor with carpet tape. Make sure that you have a light switch at the top of the stairs and the bottom of the stairs. If you do not  have them, ask someone to add them for you. What else can I do to help prevent falls? Wear shoes that: Do not have high heels. Have rubber bottoms. Are comfortable and fit you well. Are closed at the toe. Do not wear sandals. If you use a stepladder: Make sure that it is fully opened. Do not climb a closed stepladder. Make sure that both sides of the stepladder are locked into place. Ask someone to hold it for you, if possible. Clearly mark and make sure that you can see: Any grab bars or handrails. First and last steps. Where the edge of each step is. Use tools that help you move around (mobility aids) if they are needed. These include: Canes. Walkers. Scooters. Crutches. Turn on the lights when you go into a dark area. Replace any light bulbs as soon as they burn out. Set up your  furniture so you have a clear path. Avoid moving your furniture around. If any of your floors are uneven, fix them. If there are any pets around you, be aware of where they are. Review your medicines with your doctor. Some medicines can make you feel dizzy. This can increase your chance of falling. Ask your doctor what other things that you can do to help prevent falls. This information is not intended to replace advice given to you by your health care provider. Make sure you discuss any questions you have with your health care provider. Document Released: 10/10/2009 Document Revised: 05/21/2016 Document Reviewed: 01/18/2015 Elsevier Interactive Patient Education  2017 [employer].

## 2025-01-16 ENCOUNTER — Other Ambulatory Visit: Payer: Self-pay | Admitting: Adult Health
